# Patient Record
Sex: Male | Born: 1940
Health system: Southern US, Community
[De-identification: ages and names within clinical notes are randomized; demographics above are authoritative.]

## PROBLEM LIST (undated history)

## (undated) DIAGNOSIS — I779 Disorder of arteries and arterioles, unspecified: Secondary | ICD-10-CM

## (undated) DIAGNOSIS — J189 Pneumonia, unspecified organism: Secondary | ICD-10-CM

## (undated) DIAGNOSIS — R011 Cardiac murmur, unspecified: Secondary | ICD-10-CM

## (undated) DIAGNOSIS — I2510 Atherosclerotic heart disease of native coronary artery without angina pectoris: Secondary | ICD-10-CM

## (undated) DIAGNOSIS — I739 Peripheral vascular disease, unspecified: Secondary | ICD-10-CM

## (undated) DIAGNOSIS — J45909 Unspecified asthma, uncomplicated: Secondary | ICD-10-CM

## (undated) DIAGNOSIS — E785 Hyperlipidemia, unspecified: Secondary | ICD-10-CM

## (undated) DIAGNOSIS — I10 Essential (primary) hypertension: Secondary | ICD-10-CM

## (undated) DIAGNOSIS — I219 Acute myocardial infarction, unspecified: Secondary | ICD-10-CM

## (undated) DIAGNOSIS — K4090 Unilateral inguinal hernia, without obstruction or gangrene, not specified as recurrent: Secondary | ICD-10-CM

## (undated) HISTORY — DX: Hyperlipidemia, unspecified: E78.5

## (undated) HISTORY — DX: Unspecified asthma, uncomplicated: J45.909

## (undated) HISTORY — DX: Disorder of arteries and arterioles, unspecified: I77.9

## (undated) HISTORY — DX: Essential (primary) hypertension: I10

## (undated) HISTORY — PX: ORIF SHOULDER FRACTURE: SHX5035

## (undated) HISTORY — PX: HERNIA REPAIR: SHX51

## (undated) HISTORY — PX: COLONOSCOPY: SHX174

## (undated) HISTORY — PX: FRACTURE SURGERY: SHX138

## (undated) HISTORY — DX: Atherosclerotic heart disease of native coronary artery without angina pectoris: I25.10

## (undated) HISTORY — DX: Peripheral vascular disease, unspecified: I73.9

## (undated) HISTORY — DX: Unilateral inguinal hernia, without obstruction or gangrene, not specified as recurrent: K40.90

## (undated) HISTORY — PX: CARDIAC CATHETERIZATION: SHX172

## (undated) HISTORY — PX: OTHER SURGICAL HISTORY: SHX169

---

## 2000-10-28 DIAGNOSIS — I219 Acute myocardial infarction, unspecified: Secondary | ICD-10-CM

## 2000-10-28 HISTORY — DX: Acute myocardial infarction, unspecified: I21.9

## 2001-06-23 ENCOUNTER — Encounter: Payer: Self-pay | Admitting: General Surgery

## 2001-06-23 ENCOUNTER — Encounter: Admission: RE | Admit: 2001-06-23 | Discharge: 2001-06-23 | Payer: Self-pay | Admitting: General Surgery

## 2001-06-25 ENCOUNTER — Encounter (INDEPENDENT_AMBULATORY_CARE_PROVIDER_SITE_OTHER): Payer: Self-pay | Admitting: Specialist

## 2001-06-25 ENCOUNTER — Ambulatory Visit (HOSPITAL_BASED_OUTPATIENT_CLINIC_OR_DEPARTMENT_OTHER): Admission: RE | Admit: 2001-06-25 | Discharge: 2001-06-25 | Payer: Self-pay | Admitting: General Surgery

## 2001-10-09 ENCOUNTER — Inpatient Hospital Stay (HOSPITAL_COMMUNITY): Admission: EM | Admit: 2001-10-09 | Discharge: 2001-10-12 | Payer: Self-pay | Admitting: Emergency Medicine

## 2002-02-11 ENCOUNTER — Encounter (INDEPENDENT_AMBULATORY_CARE_PROVIDER_SITE_OTHER): Payer: Self-pay | Admitting: Specialist

## 2002-02-11 ENCOUNTER — Inpatient Hospital Stay (HOSPITAL_COMMUNITY): Admission: EM | Admit: 2002-02-11 | Discharge: 2002-02-13 | Payer: Self-pay

## 2002-02-11 ENCOUNTER — Encounter: Payer: Self-pay | Admitting: Emergency Medicine

## 2004-10-10 ENCOUNTER — Ambulatory Visit: Payer: Self-pay | Admitting: Cardiology

## 2004-10-15 ENCOUNTER — Ambulatory Visit: Payer: Self-pay | Admitting: Cardiovascular Disease

## 2005-04-02 ENCOUNTER — Ambulatory Visit: Payer: Self-pay | Admitting: Cardiology

## 2005-04-11 ENCOUNTER — Ambulatory Visit: Payer: Self-pay

## 2005-04-12 ENCOUNTER — Ambulatory Visit: Payer: Self-pay | Admitting: Cardiology

## 2005-04-18 ENCOUNTER — Ambulatory Visit: Payer: Self-pay | Admitting: Cardiology

## 2005-10-11 ENCOUNTER — Ambulatory Visit: Payer: Self-pay | Admitting: Cardiology

## 2005-10-17 ENCOUNTER — Ambulatory Visit: Payer: Self-pay | Admitting: Cardiology

## 2006-04-03 ENCOUNTER — Ambulatory Visit: Payer: Self-pay | Admitting: Cardiology

## 2006-04-04 ENCOUNTER — Ambulatory Visit: Payer: Self-pay

## 2006-04-07 ENCOUNTER — Ambulatory Visit: Payer: Self-pay | Admitting: Cardiology

## 2006-04-10 ENCOUNTER — Ambulatory Visit: Payer: Self-pay | Admitting: Cardiology

## 2006-08-02 ENCOUNTER — Inpatient Hospital Stay (HOSPITAL_COMMUNITY): Admission: EM | Admit: 2006-08-02 | Discharge: 2006-08-04 | Payer: Self-pay | Admitting: Emergency Medicine

## 2006-08-02 ENCOUNTER — Ambulatory Visit: Payer: Self-pay | Admitting: Cardiovascular Disease

## 2006-08-19 ENCOUNTER — Ambulatory Visit: Payer: Self-pay | Admitting: Internal Medicine

## 2006-10-02 ENCOUNTER — Ambulatory Visit: Payer: Self-pay | Admitting: Cardiology

## 2006-10-02 LAB — CONVERTED CEMR LAB
ALT: 19 units/L (ref 0–40)
AST: 23 units/L (ref 0–37)
Albumin: 4.2 g/dL (ref 3.5–5.2)
Alkaline Phosphatase: 67 units/L (ref 39–117)
Bilirubin, Direct: 0.2 mg/dL (ref 0.0–0.3)
Chol/HDL Ratio, serum: 3.9
Cholesterol: 149 mg/dL (ref 0–200)
HDL: 37.8 mg/dL — ABNORMAL LOW (ref >39.0)
LDL Cholesterol: 83 mg/dL (ref 0–99)
Total Bilirubin: 1.1 mg/dL (ref 0.3–1.2)
Total Protein: 7.2 g/dL (ref 6.0–8.3)
Triglyceride fasting, serum: 139 mg/dL (ref 0–149)
VLDL: 28 mg/dL (ref 0–40)

## 2006-10-09 ENCOUNTER — Ambulatory Visit: Payer: Self-pay | Admitting: Cardiology

## 2007-03-26 ENCOUNTER — Ambulatory Visit: Payer: Self-pay | Admitting: Cardiology

## 2007-03-26 LAB — CONVERTED CEMR LAB
ALT: 25 units/L (ref 0–40)
AST: 23 units/L (ref 0–37)
Albumin: 4.2 g/dL (ref 3.5–5.2)
Alkaline Phosphatase: 64 units/L (ref 39–117)
Bilirubin, Direct: 0.2 mg/dL (ref 0.0–0.3)
Cholesterol: 184 mg/dL (ref 0–200)
Direct LDL: 99.1 mg/dL
HDL: 31.7 mg/dL — ABNORMAL LOW (ref >39.0)
Total Bilirubin: 0.9 mg/dL (ref 0.3–1.2)
Total CHOL/HDL Ratio: 5.8
Total Protein: 7.3 g/dL (ref 6.0–8.3)
Triglycerides: 299 mg/dL (ref 0–149)
VLDL: 60 mg/dL — ABNORMAL HIGH (ref 0–40)

## 2007-04-02 ENCOUNTER — Ambulatory Visit: Payer: Self-pay | Admitting: Cardiology

## 2007-04-09 ENCOUNTER — Ambulatory Visit: Payer: Self-pay | Admitting: Cardiology

## 2007-04-09 ENCOUNTER — Ambulatory Visit: Payer: Self-pay

## 2007-04-09 LAB — CONVERTED CEMR LAB
BUN: 11 mg/dL (ref 6–23)
CO2: 29 meq/L (ref 19–32)
Calcium: 9.7 mg/dL (ref 8.4–10.5)
Chloride: 102 meq/L (ref 96–112)
Creatinine, Ser: 1 mg/dL (ref 0.4–1.5)
GFR calc Af Amer: 96 mL/min
GFR calc non Af Amer: 79 mL/min
Glucose, Bld: 93 mg/dL (ref 70–99)
Potassium: 4 meq/L (ref 3.5–5.1)
Sodium: 139 meq/L (ref 135–145)

## 2007-08-28 ENCOUNTER — Ambulatory Visit: Payer: Self-pay | Admitting: Cardiology

## 2007-08-28 LAB — CONVERTED CEMR LAB
ALT: 19 units/L (ref 0–53)
AST: 21 units/L (ref 0–37)
Albumin: 4.2 g/dL (ref 3.5–5.2)
Alkaline Phosphatase: 58 units/L (ref 39–117)
Bilirubin, Direct: 0.1 mg/dL (ref 0.0–0.3)
Cholesterol: 169 mg/dL (ref 0–200)
Direct LDL: 100.9 mg/dL
HDL: 34.9 mg/dL — ABNORMAL LOW (ref >39.0)
Total Bilirubin: 0.9 mg/dL (ref 0.3–1.2)
Total CHOL/HDL Ratio: 4.8
Total Protein: 7.3 g/dL (ref 6.0–8.3)
Triglycerides: 231 mg/dL (ref 0–149)
VLDL: 46 mg/dL — ABNORMAL HIGH (ref 0–40)

## 2007-09-03 ENCOUNTER — Ambulatory Visit: Payer: Self-pay | Admitting: Cardiology

## 2007-09-17 ENCOUNTER — Ambulatory Visit: Payer: Self-pay | Admitting: Cardiology

## 2007-09-17 LAB — CONVERTED CEMR LAB
CO2: 28 meq/L (ref 19–32)
Creatinine, Ser: 1 mg/dL (ref 0.4–1.5)
GFR calc Af Amer: 96 mL/min
Potassium: 4.3 meq/L (ref 3.5–5.1)
Sodium: 135 meq/L (ref 135–145)

## 2007-10-08 ENCOUNTER — Ambulatory Visit: Payer: Self-pay | Admitting: Cardiology

## 2007-10-08 LAB — CONVERTED CEMR LAB
ALT: 30 units/L (ref 0–53)
AST: 28 units/L (ref 0–37)
Alkaline Phosphatase: 64 units/L (ref 39–117)
Cholesterol: 152 mg/dL (ref 0–200)
LDL Cholesterol: 82 mg/dL (ref 0–99)
VLDL: 37 mg/dL (ref 0–40)

## 2007-10-15 ENCOUNTER — Ambulatory Visit: Payer: Self-pay | Admitting: Cardiology

## 2008-04-06 ENCOUNTER — Ambulatory Visit: Payer: Self-pay | Admitting: Cardiology

## 2008-04-06 ENCOUNTER — Ambulatory Visit: Payer: Self-pay

## 2008-04-12 ENCOUNTER — Ambulatory Visit: Payer: Self-pay | Admitting: Cardiology

## 2008-04-12 LAB — CONVERTED CEMR LAB
Alkaline Phosphatase: 55 units/L (ref 39–117)
Bilirubin, Direct: 0.1 mg/dL (ref 0.0–0.3)
GFR calc Af Amer: 96 mL/min
GFR calc non Af Amer: 79 mL/min
HDL: 32.4 mg/dL — ABNORMAL LOW (ref >39.0)
Potassium: 4.4 meq/L (ref 3.5–5.1)
Sodium: 140 meq/L (ref 135–145)
Total Bilirubin: 0.7 mg/dL (ref 0.3–1.2)
VLDL: 40 mg/dL (ref 0–40)

## 2008-04-14 ENCOUNTER — Ambulatory Visit: Payer: Self-pay | Admitting: Cardiology

## 2008-09-28 ENCOUNTER — Ambulatory Visit: Payer: Self-pay | Admitting: Cardiology

## 2008-10-07 ENCOUNTER — Ambulatory Visit: Payer: Self-pay | Admitting: Cardiology

## 2008-10-07 DIAGNOSIS — I2510 Atherosclerotic heart disease of native coronary artery without angina pectoris: Secondary | ICD-10-CM | POA: Insufficient documentation

## 2008-10-07 DIAGNOSIS — I2119 ST elevation (STEMI) myocardial infarction involving other coronary artery of inferior wall: Secondary | ICD-10-CM

## 2008-10-07 DIAGNOSIS — I679 Cerebrovascular disease, unspecified: Secondary | ICD-10-CM

## 2008-10-07 DIAGNOSIS — E785 Hyperlipidemia, unspecified: Secondary | ICD-10-CM

## 2008-10-07 LAB — CONVERTED CEMR LAB
ALT: 23 units/L (ref 0–53)
AST: 26 units/L (ref 0–37)
Albumin: 4.1 g/dL (ref 3.5–5.2)
Alkaline Phosphatase: 52 units/L (ref 39–117)
BUN: 22 mg/dL (ref 6–23)
Bilirubin, Direct: 0.1 mg/dL (ref 0.0–0.3)
CO2: 31 meq/L (ref 19–32)
Calcium: 9.9 mg/dL (ref 8.4–10.5)
Chloride: 100 meq/L (ref 96–112)
Cholesterol: 159 mg/dL (ref 0–200)
Creatinine, Ser: 1 mg/dL (ref 0.4–1.5)
GFR calc Af Amer: 95 mL/min
GFR calc non Af Amer: 79 mL/min
Glucose, Bld: 112 mg/dL — ABNORMAL HIGH (ref 70–99)
HDL: 41.1 mg/dL (ref >39.0)
LDL Cholesterol: 80 mg/dL (ref 0–99)
Potassium: 4.5 meq/L (ref 3.5–5.1)
Sodium: 138 meq/L (ref 135–145)
Total Bilirubin: 0.8 mg/dL (ref 0.3–1.2)
Total CHOL/HDL Ratio: 3.9
Total Protein: 7.2 g/dL (ref 6.0–8.3)
Triglycerides: 192 mg/dL — ABNORMAL HIGH (ref 0–149)
VLDL: 38 mg/dL (ref 0–40)

## 2008-10-13 ENCOUNTER — Ambulatory Visit: Payer: Self-pay | Admitting: Cardiology

## 2009-04-06 ENCOUNTER — Ambulatory Visit: Payer: Self-pay | Admitting: Cardiology

## 2009-04-13 ENCOUNTER — Ambulatory Visit: Payer: Self-pay

## 2009-04-13 ENCOUNTER — Ambulatory Visit: Payer: Self-pay | Admitting: Internal Medicine

## 2009-04-13 LAB — CONVERTED CEMR LAB
Cholesterol, target level: 200 mg/dL
HDL goal, serum: 40 mg/dL
LDL Goal: 100 mg/dL

## 2009-07-25 ENCOUNTER — Encounter (INDEPENDENT_AMBULATORY_CARE_PROVIDER_SITE_OTHER): Payer: Self-pay | Admitting: *Deleted

## 2009-10-06 ENCOUNTER — Ambulatory Visit: Payer: Self-pay | Admitting: Cardiology

## 2009-10-06 LAB — CONVERTED CEMR LAB
Bilirubin, Direct: 0.1 mg/dL (ref 0.0–0.3)
LDL Cholesterol: 77 mg/dL (ref 0–99)
Total Bilirubin: 0.7 mg/dL (ref 0.3–1.2)
Total CHOL/HDL Ratio: 4
VLDL: 35.6 mg/dL (ref 0.0–40.0)

## 2009-10-10 ENCOUNTER — Telehealth: Payer: Self-pay | Admitting: Cardiology

## 2009-10-10 ENCOUNTER — Ambulatory Visit: Payer: Self-pay | Admitting: Cardiology

## 2009-10-10 DIAGNOSIS — I10 Essential (primary) hypertension: Secondary | ICD-10-CM

## 2009-10-16 ENCOUNTER — Ambulatory Visit: Payer: Self-pay | Admitting: Cardiology

## 2009-10-31 ENCOUNTER — Ambulatory Visit: Payer: Self-pay | Admitting: Internal Medicine

## 2009-11-05 ENCOUNTER — Encounter: Payer: Self-pay | Admitting: Internal Medicine

## 2009-11-10 ENCOUNTER — Ambulatory Visit: Payer: Self-pay | Admitting: Internal Medicine

## 2009-11-15 ENCOUNTER — Ambulatory Visit: Payer: Self-pay | Admitting: Internal Medicine

## 2009-11-24 ENCOUNTER — Telehealth: Payer: Self-pay | Admitting: Internal Medicine

## 2009-11-24 ENCOUNTER — Ambulatory Visit: Payer: Self-pay | Admitting: Internal Medicine

## 2009-11-29 ENCOUNTER — Telehealth (INDEPENDENT_AMBULATORY_CARE_PROVIDER_SITE_OTHER): Payer: Self-pay

## 2009-11-30 ENCOUNTER — Ambulatory Visit: Payer: Self-pay | Admitting: Internal Medicine

## 2009-11-30 ENCOUNTER — Encounter (HOSPITAL_COMMUNITY): Admission: RE | Admit: 2009-11-30 | Discharge: 2010-02-01 | Payer: Self-pay | Admitting: Cardiology

## 2009-11-30 ENCOUNTER — Ambulatory Visit: Payer: Self-pay

## 2010-03-16 ENCOUNTER — Ambulatory Visit: Payer: Self-pay | Admitting: Cardiology

## 2010-03-20 ENCOUNTER — Encounter (INDEPENDENT_AMBULATORY_CARE_PROVIDER_SITE_OTHER): Payer: Self-pay | Admitting: *Deleted

## 2010-03-20 LAB — CONVERTED CEMR LAB
ALT: 20 U/L
AST: 18 U/L
Albumin: 4.1 g/dL
Alkaline Phosphatase: 59 U/L
Bilirubin, Direct: 0.1 mg/dL
Cholesterol: 149 mg/dL
HDL: 39.1 mg/dL
LDL Cholesterol: 74 mg/dL
Total Bilirubin: 0.8 mg/dL
Total CHOL/HDL Ratio: 4
Total Protein: 6.8 g/dL
Triglycerides: 181 mg/dL — ABNORMAL HIGH
VLDL: 36.2 mg/dL

## 2010-03-29 ENCOUNTER — Ambulatory Visit: Payer: Self-pay | Admitting: Internal Medicine

## 2010-04-17 ENCOUNTER — Encounter: Payer: Self-pay | Admitting: Cardiology

## 2010-04-18 ENCOUNTER — Ambulatory Visit: Payer: Self-pay

## 2010-04-18 ENCOUNTER — Encounter: Payer: Self-pay | Admitting: Cardiology

## 2010-10-01 ENCOUNTER — Ambulatory Visit: Payer: Self-pay | Admitting: Cardiology

## 2010-10-02 LAB — CONVERTED CEMR LAB
AST: 21 units/L (ref 0–37)
Albumin: 4 g/dL (ref 3.5–5.2)
Cholesterol: 139 mg/dL (ref 0–200)
HDL: 35.4 mg/dL — ABNORMAL LOW (ref >39.00)
LDL Cholesterol: 84 mg/dL (ref 0–99)
Total CHOL/HDL Ratio: 4
Total Protein: 6.7 g/dL (ref 6.0–8.3)
Triglycerides: 96 mg/dL (ref 0.0–149.0)

## 2010-10-11 ENCOUNTER — Ambulatory Visit: Payer: Self-pay | Admitting: Internal Medicine

## 2010-10-11 ENCOUNTER — Ambulatory Visit: Payer: Self-pay | Admitting: Cardiology

## 2010-10-11 ENCOUNTER — Encounter: Payer: Self-pay | Admitting: Cardiology

## 2010-10-11 LAB — CONVERTED CEMR LAB
BUN: 15 mg/dL (ref 6–23)
CO2: 28 meq/L (ref 19–32)
Calcium: 9.5 mg/dL (ref 8.4–10.5)
Creatinine, Ser: 1 mg/dL (ref 0.4–1.5)
Glucose, Bld: 88 mg/dL (ref 70–99)

## 2010-11-25 LAB — CONVERTED CEMR LAB
AST: 19 units/L (ref 0–37)
Albumin: 4 g/dL (ref 3.5–5.2)
Alkaline Phosphatase: 58 units/L (ref 39–117)
BUN: 10 mg/dL (ref 6–23)
CO2: 29 meq/L (ref 19–32)
Chloride: 103 meq/L (ref 96–112)
Cholesterol: 118 mg/dL (ref 0–200)
Glucose, Bld: 98 mg/dL (ref 70–99)
LDL Cholesterol: 60 mg/dL (ref 0–99)
Potassium: 4 meq/L (ref 3.5–5.1)
Total Protein: 7 g/dL (ref 6.0–8.3)
Triglycerides: 110 mg/dL (ref 0.0–149.0)

## 2010-11-27 NOTE — Assessment & Plan Note (Signed)
Summary: lipid/saf   Visit Type:  Follow-up  CC:  dyslipidemia follow-up.   Lipid Clinic Visit      The patient comes in today for dyslipidemia follow-up.  The patient has no complaints of medication problems, chest pain, muscle aches, or  muscle cramps.  He is compliant with his Crestor 40mg  and fish oil 2 gm daily.   Dietary compliance review reveals pt is trying to eat a low-fat, low-cholesterol diet.  His wife does cook most of their meals.  He eats lots of chicken and no red meat of fried foods.  He does report drinking at least 1 Pepsi a day.  Review of exercise habits reveals that the patient does not have a formal exercise routine but is staying active.  He fishes a few times a week, runs 2 car washes and works in his yard.    Lipid Management Provider  Weston Brass, PharmD  Current Medications (verified): 1)  Lisinopril 40 Mg Tabs (Lisinopril) .... Take One Tablet By Mouth Daily 2)  Toprol Xl 100 Mg Xr24h-Tab (Metoprolol Succinate) .... Take One Daily 3)  Aspirin 81 Mg Chew (Aspirin) .... Take One Daily 4)  Crestor 40 Mg Tabs (Rosuvastatin Calcium) .... Take One Daily 5)  Hydrochlorothiazide 12.5 Mg Caps (Hydrochlorothiazide) .... Take One Daily 6)  Fish Oil 1000 Mg Caps (Omega-3 Fatty Acids) .... Take Two Daily  Allergies: 1)  ! Penicillin   Vital Signs:  Patient profile:   70 year old male Weight:      169 pounds Pulse rate:   70 / minute BP sitting:   102 / 64  (right arm) Cuff size:   regular  Impression & Recommendations:  Problem # 1:  HYPERLIPIDEMIA-MIXED (ICD-272.4) Assessment Unchanged Since December, pt's cholesterol remains relatively unchanged.  TC- 149 (goal <200), TG- 181 (goal <150), HDL 39.1 (goal >40), LDL 74 (goal <70).  LFT are WNL.  He has no issues tolerating his current medication regimen.  Will try to increase his fish oil to 3 gm daily to decrease TG and maybe get some reduction in LDL.  Pt is reluctant to increase pill burden so will not add  additional LDL lowering agent at this time.  Will ask him to continue his diet but try to decrease the amount of soft drinks he consumes.  Will recheck his cholesterol in 6 months.   His updated medication list for this problem includes:    Crestor 40 Mg Tabs (Rosuvastatin calcium) .Marland Kitchen... Take one daily  Patient Instructions: 1)  Continue Crestor 40mg  daily 2)  Increase Fish Oil to 3 capsules a day 3)  Decrease amount of Pepsi you drink 4)  Continue working in yard and exercising 5)  Recheck lipid panel in 6 months: 10/01/10 at 7:30 am at [facility] office 6)  Next Lipid Clinic Appt: 10/11/10 at 9:00

## 2010-11-27 NOTE — Letter (Signed)
Summary: Custom - Lipid  [facility], Main Office  [address]   [place], [place] [address]   Phone: [phone]  Fax: [phone]     Mar 20, 2010 MRN: [id number]   [facility] [address] [place], [place]  [address]   Dear [name],  We have reviewed your cholesterol results.  They are as follows:     Total Cholesterol:    149 (Desirable: less than 200)       HDL  Cholesterol:     39.10  (Desirable: greater than 40 for men and 50 for women)       LDL Cholesterol:       74  (Desirable: less than 100 for low risk and less than 70 for moderate to high risk)       Triglycerides:       181.0  (Desirable: less than 150)  Our recommendations include:These numbers look good. Continue on the same medicine. Liver function is normal. Take care, Dr. Darel Hong.    Call our office at the number listed above if you have any questions.  Lowering your LDL cholesterol is important, but it is only one of a large number of "risk factors" that may indicate that you are at risk for heart disease, stroke or other complications of hardening of the arteries.  Other risk factors include:   A.  Cigarette Smoking* B.  High Blood Pressure* C.  Obesity* D.   Low HDL Cholesterol (see yours above)* E.   Diabetes Mellitus (higher risk if your is uncontrolled) F.  Family history of premature heart disease G.  Previous history of stroke or cardiovascular disease    *These are risk factors YOU HAVE CONTROL OVER.  For more information, visit .  There is now evidence that lowering the TOTAL CHOLESTEROL AND LDL CHOLESTEROL can reduce the risk of heart disease.  The American Heart Association recommends the following guidelines for the treatment of elevated cholesterol:  1.  If there is now current heart disease and less than two risk factors, TOTAL CHOLESTEROL should be less than 200 and LDL CHOLESTEROL should be less than 100. 2.  If there is current heart disease or two or  more risk factors, TOTAL CHOLESTEROL should be less than 200 and LDL CHOLESTEROL should be less than 70.  A diet low in cholesterol, saturated fat, and calories is the cornerstone of treatment for elevated cholesterol.  Cessation of smoking and exercise are also important in the management of elevated cholesterol and preventing vascular disease.  Studies have shown that 30 to 60 minutes of physical activity most days can help lower blood pressure, lower cholesterol, and keep your weight at a healthy level.  Drug therapy is used when cholesterol levels do not respond to therapeutic lifestyle changes (smoking cessation, diet, and exercise) and remains unacceptably high.  If medication is started, it is important to have you levels checked periodically to evaluate the need for further treatment options.  Thank you,    [employer] Team

## 2010-11-27 NOTE — Assessment & Plan Note (Signed)
Summary: Cardiology Nuclear Study  Nuclear Med Background Indications for Stress Test: Evaluation for Ischemia, Stent Patency   History: Abnormal EKG, Heart Catheterization, Myocardial Infarction, Stents  History Comments: '02 ILWMI>Stent RCA 10/07 Cath: 20% in-stent, 50% LAD, EF=65%.  Symptoms: DOE    Nuclear Pre-Procedure Cardiac Risk Factors: Carotid Disease, Hypertension, Lipids Caffeine/Decaff Intake: None NPO After: 7:00 PM Lungs: clear IV 0.9% NS with Angio Cath: 22g     IV Site: (R) AC IV Started by: Irean Hong RN Chest Size (in) 40     Height (in): 69 Weight (lb): 167 BMI: 24.75  Nuclear Med Study 1 or 2 day study:  1 day     Stress Test Type:  Eugenie Birks Reading MD:  Arvilla Meres, MD     Referring MD:  B.Crenshaw Resting Radionuclide:  Technetium 33m Tetrofosmin     Resting Radionuclide Dose:  11.0 mCi  Stress Radionuclide:  Technetium 22m Tetrofosmin     Stress Radionuclide Dose:  33.0 mCi   Stress Protocol   Lexiscan: 0.4 mg   Stress Test Technologist:  Milana Na EMT-P     Nuclear Technologist:  Burna Mortimer Deal RT-N  Rest Procedure  Myocardial perfusion imaging was performed at rest 45 minutes following the intravenous administration of Myoview Technetium 56m Tetrofosmin.  Stress Procedure  The patient received IV Lexiscan 0.4 mg over 15-seconds.  Myoview injected at 30-seconds.  There were no significant changes with infusion.  Quantitative spect images were obtained after a 45 minute delay.  QPS Raw Data Images:  Normal; no motion artifact; normal heart/lung ratio. Stress Images:  Mildly decreased uptake in inferolateral wall  Rest Images:  Mildly decreased uptake in inferolateral wall  Subtraction (SDS):  There is a fixed defect that is most consistent with a previous infarction versus diaprhagmatic attenuation. No ischemia. Transient Ischemic Dilatation:  .99  (Normal <1.22)  Lung/Heart Ratio:  .29  (Normal <0.45)  Quantitative Gated Spect  Images QGS EDV:  70 ml QGS ESV:  21 ml QGS EF:  70 % QGS cine images:  Normal  Findings Low risk nuclear study      Overall Impression  Exercise Capacity: Lexiscan with low level exercise. ECG Impression: Baseline: NSR; No significant ST segment change with Lexiscan. Overall Impression: Low risk stress nuclear study. Overall Impression Comments: There is a fixed defect that is most consistent with a previous infarction versus diaprhagmatic attenuation. No ischemia.  Appended Document: Cardiology Nuclear Study ok  Appended Document: Cardiology Nuclear Study pt aware of results

## 2010-11-27 NOTE — Miscellaneous (Signed)
Summary: Orders Update  Clinical Lists Changes  Orders: Added new Test order of Carotid Duplex (Carotid Duplex) - Signed 

## 2010-11-27 NOTE — Progress Notes (Signed)
Summary: Nuc. Pre-Procedure  Phone Note Outgoing Call Call back at Home Phone [phone]   Call placed by: Irean Hong, RN,  November 29, 2009 1:48 PM Summary of Call: Left message with information on Myoview Information Sheet (see scanned document for details).      Nuclear Med Background Indications for Stress Test: Evaluation for Ischemia, Stent Patency   History: Abnormal EKG, Heart Catheterization, Myocardial Infarction, Stents  History Comments: '02 ILWMI>Stent RCA 10/07 Cath: 20% in-stent, 50% LAD, EF=65%.  Symptoms: DOE    Nuclear Pre-Procedure Cardiac Risk Factors: Carotid Disease, Hypertension, Lipids NPO After: 12:00 AM Height (in): 69

## 2010-11-27 NOTE — Assessment & Plan Note (Signed)
Summary: SUTURE REMOVAL/ MEN /NWS   Nurse Visit  CC: I removed #3 superfical sutures from pt's right shoulder/ pt tolerated well/ ab   Allergies: 1)  ! Penicillin  Orders Added: 1)  Est. Patient Level I [96295]

## 2010-11-27 NOTE — Assessment & Plan Note (Signed)
Summary: knot on back/over 5 yrs since he saw men/30 min slot/cd   Vital Signs:  Patient profile:   70 year old male Height:      69 inches Weight:      167 pounds BMI:     24.75 O2 Sat:      96 % on Room air Temp:     98.1 degrees F oral Pulse rate:   76 / minute BP sitting:   128 / 64  (left arm) Cuff size:   regular  Vitals Entered By: Bill Salinas CMA (October 31, 2009 2:40 PM)  O2 Flow:  Room air CC: pt here with complaint of knot that has been on his back for over 1 year/ ab   Primary Care Provider:  Jacques Navy MD  CC:  pt here with complaint of knot that has been on his back for over 1 year/ ab.  History of Present Illness: Patient presents for I&D sebaceous cyst on his back. He also has a large mole in the right axilla he would like removed.   Current Medications (verified): 1)  Lisinopril 40 Mg Tabs (Lisinopril) .... Take One Tablet By Mouth Daily 2)  Toprol Xl 100 Mg Xr24h-Tab (Metoprolol Succinate) .... Take One Daily 3)  Aspirin 81 Mg Chew (Aspirin) .... Take One Daily 4)  Crestor 40 Mg Tabs (Rosuvastatin Calcium) .... Take One Daily 5)  Hydrochlorothiazide 12.5 Mg Caps (Hydrochlorothiazide) .... Take One Daily 6)  Fish Oil 1000 Mg Caps (Omega-3 Fatty Acids) .... Take Two Daily  Allergies (verified): 1)  ! Penicillin   Impression & Recommendations:  Problem # 1:  SEBACEOUS CYST (ICD-706.2)  large sebaceous cyst causing discomfort. I&D see procedure note.  Orders: I&D Abscess, Simple / Single (10060)  Problem # 2:  NEOPLASM, SKIN, UNCERTAIN BEHAVIOR (ICD-238.2)  changing mole - shave biopsy see procedure note.   Orders: Shave Skin Lesion  >2.0 cm,/trunk,/arm/leg (16109)  Complete Medication List: 1)  Lisinopril 40 Mg Tabs (Lisinopril) .... Take one tablet by mouth daily 2)  Toprol Xl 100 Mg Xr24h-tab (Metoprolol succinate) .... Take one daily 3)  Aspirin 81 Mg Chew (Aspirin) .... Take one daily 4)  Crestor 40 Mg Tabs (Rosuvastatin calcium)  .... Take one daily 5)  Hydrochlorothiazide 12.5 Mg Caps (Hydrochlorothiazide) .... Take one daily 6)  Fish Oil 1000 Mg Caps (Omega-3 fatty acids) .... Take two daily   Preventive Care Screening  Last Tetanus Booster:    Date:  12/30/2000    Results:  Historical     Not Administered:    Influenza Vaccine not given due to: declined    Procedure Note  Incision & Drainage: The patient complains of irritation, inflammation, and tenderness. Indication: uncomfortable lesion   Procedure # 1: I & D    Size (in cm): 5.0 x 5.0    Location: mid-back T2 level    Comment: consent obtained. in a sterile manner cyst was incised with #10 blade. Expressed 20-30 cc sebum. With single tooth forceps and curve hemostat removed cyst capsule. Closed. Patient tolerated this well    Instrument used: #10 blade    Anesthesia: 2% xylocaine with epi    Closure: simple interrupted       # of superficial sutures: 5  Cleaned and prepped with: betadine Wound dressing: neosporin and bandaid Instructions: daily dressing changes and RTC in 7-10 days Additional Instructions: routine wound precautions provided  Mole Biopsy/Removal: The patient complains of changing mole. Indication: changing lesion  Procedure #  1: shave biopsy    Size (in cm): 2.0 x 2.0    Location: right axilla    Comment: informed consent. Dark broad pedicle mole.    Instrument used: dermablade    Anesthesia: 2% xylocain w/ epi    Closure: hyfrecator  Cleaned and prepped with: betadine Wound dressing: bandaid Additional Instructions: routine wound precautions.

## 2010-11-27 NOTE — Assessment & Plan Note (Signed)
Summary: SUTURE REMOVAL/NWS   #   Vital Signs:  Patient profile:   70 year old male Height:      69 inches Weight:      172 pounds BMI:     25.49 O2 Sat:      96 % on Room air Temp:     98.1 degrees F oral Pulse rate:   78 / minute BP sitting:   120 / 72  (left arm) Cuff size:   regular  Vitals Entered By: Bill Salinas CMA (November 10, 2009 11:33 AM)  O2 Flow:  Room air CC: pt here for suture removal. I removed 5 superficial sutures from pt's back, pt tolerated well/ ab   Primary Care Provider:  Jacques Navy MD  CC:  pt here for suture removal. I removed 5 superficial sutures from pt's back and pt tolerated well/ ab.  History of Present Illness: patient presents for suture removal after I&D sebaceous cyst upper back. No complaints - no drainage or fever.  Current Medications (verified): 1)  Lisinopril 40 Mg Tabs (Lisinopril) .... Take One Tablet By Mouth Daily 2)  Toprol Xl 100 Mg Xr24h-Tab (Metoprolol Succinate) .... Take One Daily 3)  Aspirin 81 Mg Chew (Aspirin) .... Take One Daily 4)  Crestor 40 Mg Tabs (Rosuvastatin Calcium) .... Take One Daily 5)  Hydrochlorothiazide 12.5 Mg Caps (Hydrochlorothiazide) .... Take One Daily 6)  Fish Oil 1000 Mg Caps (Omega-3 Fatty Acids) .... Take Two Daily  Allergies (verified): 1)  ! Penicillin PMH-FH-SH reviewed-no changes except otherwise noted  Review of Systems       The patient complains of suspicious skin lesions.  The patient denies fever.    Physical Exam  General:  Well-developed,well-nourished,in no acute distress; alert,appropriate and cooperative throughout examination Skin:  well healing incision site upper back without drainage, dehescience or inflammation.  Black, raised mole right upper scapula-laterally with irregular border.   Impression & Recommendations:  Problem # 1:  SEBACEOUS CYST (ICD-706.2) Resolved with good wound healing. Sutures removed by Ami Bullin CMA.  Problem # 2:  NEOPLASM, SKIN,  UNCERTAIN BEHAVIOR (ICD-238.2) suspicious black mole right shoulder.  Plan - patient to return for excisional biopsy.  Complete Medication List: 1)  Lisinopril 40 Mg Tabs (Lisinopril) .... Take one tablet by mouth daily 2)  Toprol Xl 100 Mg Xr24h-tab (Metoprolol succinate) .... Take one daily 3)  Aspirin 81 Mg Chew (Aspirin) .... Take one daily 4)  Crestor 40 Mg Tabs (Rosuvastatin calcium) .... Take one daily 5)  Hydrochlorothiazide 12.5 Mg Caps (Hydrochlorothiazide) .... Take one daily 6)  Fish Oil 1000 Mg Caps (Omega-3 fatty acids) .... Take two daily

## 2010-11-27 NOTE — Letter (Signed)
     November 06, 2009   [facility] [address] [place], [place] [address]  RE:  LAB RESULTS  Dear  Mr. Medero,  The following is an interpretation of your most recent lab tests.  Please take note of any instructions provided or changes to medications that have resulted from your lab work.   Lesion from the right axilla (armpit) was a benign seborrheic keratosis.   Sincerely Yours,    Jacques Navy MD

## 2010-11-27 NOTE — Progress Notes (Signed)
  Phone Note Other Incoming   Summary of Call: Pt came in today to have his sutures removed. He was wanting results from mole removal. Please Advise. Initial call taken by: Ami Bullins CMA,  November 24, 2009 10:12 AM  Follow-up for Phone Call        pigmented seborrheic keratosis = a benign lesion. Any trouble getting the sutres out.  Follow-up by: Jacques Navy MD,  November 24, 2009 2:11 PM  Additional Follow-up for Phone Call Additional follow up Details #1::        No everything came out well. Healed well and looks great. Additional Follow-up by: Ami Bullins CMA,  November 24, 2009 2:30 PM

## 2010-11-27 NOTE — Assessment & Plan Note (Signed)
Summary: mole removal/cd   Vital Signs:  Patient profile:   70 year old male Height:      69 inches Weight:      170 pounds BMI:     25.20 O2 Sat:      96 % on Room air Temp:     97.3 degrees F oral Pulse rate:   72 / minute BP sitting:   128 / 70  (left arm) Cuff size:   regular  Vitals Entered By: Bill Salinas CMA (November 15, 2009 11:28 AM)  O2 Flow:  Room air CC: office visit for mole removal/ ab   Primary Care Provider:  Jacques Navy MD  CC:  office visit for mole removal/ ab.  History of Present Illness: Presents for excisional biopsy of a very suspicious mole on the right shoulder.  Current Medications (verified): 1)  Lisinopril 40 Mg Tabs (Lisinopril) .... Take One Tablet By Mouth Daily 2)  Toprol Xl 100 Mg Xr24h-Tab (Metoprolol Succinate) .... Take One Daily 3)  Aspirin 81 Mg Chew (Aspirin) .... Take One Daily 4)  Crestor 40 Mg Tabs (Rosuvastatin Calcium) .... Take One Daily 5)  Hydrochlorothiazide 12.5 Mg Caps (Hydrochlorothiazide) .... Take One Daily 6)  Fish Oil 1000 Mg Caps (Omega-3 Fatty Acids) .... Take Two Daily  Allergies (verified): 1)  ! Penicillin   Complete Medication List: 1)  Lisinopril 40 Mg Tabs (Lisinopril) .... Take one tablet by mouth daily 2)  Toprol Xl 100 Mg Xr24h-tab (Metoprolol succinate) .... Take one daily 3)  Aspirin 81 Mg Chew (Aspirin) .... Take one daily 4)  Crestor 40 Mg Tabs (Rosuvastatin calcium) .... Take one daily 5)  Hydrochlorothiazide 12.5 Mg Caps (Hydrochlorothiazide) .... Take one daily 6)  Fish Oil 1000 Mg Caps (Omega-3 fatty acids) .... Take two daily  Other Orders: Excise lesion (TAL) 0 - 0.5 cm (11400)   Procedure Note Last Tetanus: Historical (12/30/2000)  Biopsy: Biopsy Type: Skin The patient complains of suspicious lesion. Indication: suspicious lesion Consent signed: yes  Procedure # 1: elliptical incision with 2 mm margin    Size (in cm): .5 x .5    Location: right shoulder/scapula    Comment:  informed consent obtained    Instrument used: #15 blade    Anesthesia: 2% lidocaine w/epinephrine    Closure: simple interrupted       # of superficial sutures: 3  Cleaned and prepped with: betadine Wound dressing: bandaid Instructions: RTC in 7-10 days Additional Instructions: routine wound precautions.

## 2010-11-27 NOTE — Miscellaneous (Signed)
Summary: Special Procedure/[facility]  Special Procedure/[facility]   Imported By: Lester Glen Ullin 11/16/2009 10:06:02  _____________________________________________________________________  External Attachment:    Type:   Image     Comment:   External Document

## 2010-11-29 NOTE — Assessment & Plan Note (Signed)
Summary: YEARLY/SL  Medications Added FISH OIL 1000 MG CAPS (OMEGA-3 FATTY ACIDS) 3 tabs by mouth once daily        Primary Provider:  Jacques Navy MD   History of Present Illness: [name] is a pleasant gentleman who has a history of coronary artery disease.  His last cardiac catheterization on August 04, 2006, showed an ejection fraction of 65%.  The distal LAD had a 50% stenosis at the origin of the diagonal.  The right coronary artery stent was patent. There is a 20% in-stent restenosis. Last carotid Dopplers in June of 2011 and showed a 40-59% left stenosis and a chronically occluded right internal carotid artery. Followup was recommended in one year. Note an abdominal ultrasound in May 2005 showed no aneurysm. Myoview in February of 2011 showed an ejection fraction of 70%, fixed inferior defect and no ischemia. I last saw him in December of 2010. Since then the patient denies any dyspnea on exertion, orthopnea, PND, pedal edema, palpitations, syncope or chest pain.   Current Medications (verified): 1)  Lisinopril 40 Mg Tabs (Lisinopril) .... Take One Tablet By Mouth Daily 2)  Toprol Xl 100 Mg Xr24h-Tab (Metoprolol Succinate) .... Take One Daily 3)  Aspirin 81 Mg Chew (Aspirin) .... Take One Daily 4)  Crestor 40 Mg Tabs (Rosuvastatin Calcium) .... Take One Daily 5)  Hydrochlorothiazide 12.5 Mg Caps (Hydrochlorothiazide) .... Take One Daily 6)  Fish Oil 1000 Mg Caps (Omega-3 Fatty Acids) .... 3 Tabs By Mouth Once Daily  Allergies: 1)  ! Penicillin  Past History:  Past Medical History: Reviewed history from 10/10/2009 and no changes required. Current Problems:  UNSPECIFIED CEREBROVASCULAR DISEASE (ICD-437.9) HYPERLIPIDEMIA-MIXED (ICD-272.4) CORONARY ATHEROSCLEROSIS NATIVE CORONARY ARTERY (ICD-414.01) MYOCARDIAL INFARCTION, INFEROLATERAL WALL, INITIAL EPISODE (ICD-410.21) Hypertension  Past Surgical History: Reviewed history from 10/07/2008 and no changes  required. Ventral Hernia Repair  Social History: Reviewed history from 10/10/2009 and no changes required. Tobacco Use - No.  Alcohol Use - no  Review of Systems       no fevers or chills, productive cough, hemoptysis, dysphasia, odynophagia, melena, hematochezia, dysuria, hematuria, rash, seizure activity, orthopnea, PND, pedal edema, claudication. Remaining systems are negative.   Vital Signs:  Patient profile:   70 year old male Height:      69 inches Weight:      172 pounds BMI:     25.49 Pulse rate:   84 / minute Resp:     14 per minute BP sitting:   138 / 79  (left arm)  Vitals Entered By: Kem Parkinson (October 11, 2010 10:16 AM)  Physical Exam  General:  Well-developed well-nourished in no acute distress.  Skin is warm and dry.  HEENT is normal.  Neck is supple. No thyromegaly.  Chest is clear to auscultation with normal expansion.  Cardiovascular exam is regular rate and rhythm.  Abdominal exam nontender or distended. No masses palpated. Extremities show no edema. neuro grossly intact    EKG  Procedure date:  10/11/2010  Findings:      Sinus rhythm with fusion complexes, left anterior vesicular block.  Impression & Recommendations:  Problem # 1:  ESSENTIAL HYPERTENSION, BENIGN (ICD-401.1)  Blood pressure controlled on present medications. Will continue. Check potassium and renal function. His updated medication list for this problem includes:    Lisinopril 40 Mg Tabs (Lisinopril) .Marland Kitchen... Take one tablet by mouth daily    Toprol Xl 100 Mg Xr24h-tab (Metoprolol succinate) .Marland Kitchen... Take one daily    Aspirin 81  Mg Chew (Aspirin) .Marland Kitchen... Take one daily    Hydrochlorothiazide 12.5 Mg Caps (Hydrochlorothiazide) .Marland Kitchen... Take one daily  Orders: TLB-BMP (Basic Metabolic Panel-BMET) (80048-METABOL)  Problem # 2:  UNSPECIFIED CEREBROVASCULAR DISEASE (ICD-437.9) Continue aspirin and statin. Followup carotid Dopplers June 2012.  Problem # 3:  HYPERLIPIDEMIA-MIXED  (ICD-272.4)  Continue statin. Followed in the lipid clinic. His updated medication list for this problem includes:    Crestor 40 Mg Tabs (Rosuvastatin calcium) .Marland Kitchen... Take one daily  His updated medication list for this problem includes:    Crestor 40 Mg Tabs (Rosuvastatin calcium) .Marland Kitchen... Take one daily  Problem # 4:  CORONARY ATHEROSCLEROSIS NATIVE CORONARY ARTERY (ICD-414.01) Continue aspirin, beta blocker, ACE inhibitor and statin. His updated medication list for this problem includes:    Lisinopril 40 Mg Tabs (Lisinopril) .Marland Kitchen... Take one tablet by mouth daily    Toprol Xl 100 Mg Xr24h-tab (Metoprolol succinate) .Marland Kitchen... Take one daily    Aspirin 81 Mg Chew (Aspirin) .Marland Kitchen... Take one daily  Orders: EKG w/ Interpretation (93000) TLB-BMP (Basic Metabolic Panel-BMET) (80048-METABOL)  His updated medication list for this problem includes:    Lisinopril 40 Mg Tabs (Lisinopril) .Marland Kitchen... Take one tablet by mouth daily    Toprol Xl 100 Mg Xr24h-tab (Metoprolol succinate) .Marland Kitchen... Take one daily    Aspirin 81 Mg Chew (Aspirin) .Marland Kitchen... Take one daily  Patient Instructions: 1)  Your physician recommends that you schedule a follow-up appointment in: 1 year with Dr. Jens Som 2)  Your physician recommends that you continue on your current medications as directed. Please refer to the Current Medication list given to you today. 3)  Your physician recommends that you  have lab work today: bmet

## 2010-11-29 NOTE — Assessment & Plan Note (Signed)
Summary: rov/sp     Lipid Clinic Visit      The patient comes in today for dyslipidemia follow-up.  The patient has no complaints of medication problems, chest pain, muscle aches, or  muscle cramps.  He is compliant with his Crestor 40mg  and fish oil 2 gm daily.   Dietary compliance review reveals pt is trying to eat a low-fat, low-cholesterol diet.  For breakfast he will often have 3-4 cups of decaf coffee and a sausage burritto.  Lunch is his biggest meal of the day. He and his wife will go to the cafeteria and get vegetables.  If he does eat dinner, his wife does most of the cooking.   It is often something like chicken and rice.  He does report drinking at least 1 Pepsi a day.  Review of exercise habits reveals that the patient does not have a formal exercise routine but is staying active.  He fishes a few times a week, runs 2 car washes and works in his yard.   He saw Dr. Jens Som today and no changes were made.   Lipid Management Provider  Weston Brass, PharmD  Current Medications (verified): 1)  Lisinopril 40 Mg Tabs (Lisinopril) .... Take One Tablet By Mouth Daily 2)  Toprol Xl 100 Mg Xr24h-Tab (Metoprolol Succinate) .... Take One Daily 3)  Aspirin 81 Mg Chew (Aspirin) .... Take One Daily 4)  Crestor 40 Mg Tabs (Rosuvastatin Calcium) .... Take One Daily 5)  Hydrochlorothiazide 12.5 Mg Caps (Hydrochlorothiazide) .... Take One Daily 6)  Fish Oil 1000 Mg Caps (Omega-3 Fatty Acids) .... Take Two Daily  Allergies: 1)  ! Penicillin  Past History:  Past Medical History: UNSPECIFIED CEREBROVASCULAR DISEASE (ICD-437.9) HYPERLIPIDEMIA-MIXED (ICD-272.4) CORONARY ATHEROSCLEROSIS NATIVE CORONARY ARTERY (ICD-414.01) MYOCARDIAL INFARCTION, INFEROLATERAL WALL, INITIAL EPISODE (ICD-410.21) Hypertension  Past Surgical History: Reviewed history from 10/07/2008 and no changes required. Ventral Hernia Repair  Social History: Reviewed history from 10/10/2009 and no changes required. Tobacco Use -  No.  Alcohol Use - no  Review of Systems       no fevers or chills, productive cough, hemoptysis, dysphasia, odynophagia, melena, hematochezia, dysuria, hematuria, rash, seizure activity, orthopnea, PND, pedal edema, claudication. Remaining systems are negative.   Physical Exam  General:  Well-developed well-nourished in no acute distress.  Skin is warm and dry.  HEENT is normal.  Neck is supple. No thyromegaly.  Chest is clear to auscultation with normal expansion.  Cardiovascular exam is regular rate and rhythm.  Abdominal exam nontender or distended. No masses palpated. Extremities show no edema. neuro grossly intact    Vital Signs:  Patient profile:   70 year old male Height:      69 inches Weight:      172 pounds BMI:     25.49 Pulse rate:   84 / minute BP sitting:   138 / 79  Impression & Recommendations:  Problem # 1:  HYPERLIPIDEMIA-MIXED (ICD-272.4) Pt's cholesterol remains stable.  LDL increased slightly but still near goal of <70.  LFTs are WNL.  Pt tolerating medications so will continue with no changes.  Will allow pt to f/u with Dr. Jens Som in future for lipid management.    His updated medication list for this problem includes:    Crestor 40 Mg Tabs (Rosuvastatin calcium) .Marland Kitchen... Take one daily  Patient Instructions: 1)  F/u with Dr. Jens Som in 6 months- 1 year to recheck cholesterol.

## 2011-02-12 ENCOUNTER — Other Ambulatory Visit: Payer: Self-pay | Admitting: Cardiology

## 2011-03-12 NOTE — Assessment & Plan Note (Signed)
[facility]                               LIPID CLINIC NOTE   HEMI, CHACKO                        MRN:          [id number]  DATE:04/02/2007                            DOB:          03/15/1941    [name] comes in today with no complaints.  He has been compliant with  his cholesterol medicines which include Crestor 20 mg daily.  He has no  muscle aches or pains or any other problems with his medicine.  Other  medications did not change.  They include Toprol XL, Altace, Protonix,  and 81 mg aspirin.   PHYSICAL EXAMINATION:  Weight is 181 pounds, blood pressure is 150/80,  heart rate is 94.   LABORATORY DATA:  Total cholesterol 184, triglycerides 299, HDL 31.7,  LDL 99.1, liver function tests are within normal limits.   ASSESSMENT:  [name]' triglycerides, HDL, and LDL are slightly worse  than last time, and are now not at goals.  His weight is up also, 2  pounds.  His blood pressure is a little bit higher than last time, as  well as his heart rate.  [name] attributes all of this to recent  dietary and exercise indiscretions with following his son's return from  a year and a half stay in [place] for his job.  There was a lot of  celebrating, special events, lots of high-fat foods, and he did not  exercise much at all for the period leading up to this blood draw.  Mr.  [name] is reluctant to change or increase his Crestor dose.  He thinks  that with getting back with the lifestyle modifications, he will be able  to bring those numbers closer to goal.  I talked to that if this is not  so with his next followup, we would probably consider increasing the  Crestor to a maximum dose of 40 mg daily.  He was instructed to call us  with any problems or complaints until his next appointment.  We set up  his next appointment for 6 months from now.      Charolotte Eke, PharmD  Electronically Signed      Rollene Rotunda, MD, [facility]  Electronically Signed   TP/MedQ  DD: 04/02/2007  DT: 04/02/2007  Job #: [id number]

## 2011-03-12 NOTE — Assessment & Plan Note (Signed)
[facility]                            CARDIOLOGY OFFICE NOTE   [name], [name]                        MRN:          [id number]  DATE:09/28/2008                            DOB:          12-24-1940    [name] is a pleasant gentleman who has a history of coronary artery  disease.  His last cardiac catheterization on August 04, 2006, showed an  ejection fraction of 65%.  The distal LAD had a 50% stenosis at the  origin of the diagonal.  The right coronary artery stent was patent.  There is a 20% in-stent restenosis.  We have been treating medically.  [name] also has cerebrovascular disease.  Since I last saw him, [name] is doing  well.  There is no dyspnea, chest pain, palpitations, or syncope.   MEDICATIONS:  1. Toprol 100 mg p.o. daily.  2. Aspirin 81 mg p.o. daily.  3. Lisinopril 40 mg p.o. daily.  4. Crestor 40 mg p.o. daily.  5. Fish oil 1 g b.i.d.   PHYSICAL EXAMINATION:  VITAL SIGNS:  Today shows a blood pressure of  148/76 and the pulse is 71.  [name] weighs 173 pounds.  HEENT:  Normal.  NECK:  Supple.  CHEST:  Clear.  CARDIOVASCULAR:  Regular rate.  ABDOMEN:  No tenderness.  EXTREMITIES:  No edema.   His electrocardiogram shows sinus rhythm at a rate of 71.  There is left  axis deviation.  There are no ST changes noted.   DIAGNOSES:  1. Coronary artery disease - [name] is doing well with no chest      pain or shortness of breath.  [name] will continue with his aspirin,      beta-blocker, angiotensin-converting enzyme inhibitor, and statin.      [name] will also continue his diet and exercise.  2. Cerebrovascular disease - [name] will need followup carotid Dopplers in      June 2010.  [name] will continue on his aspirin and statin.  3. Hypertension - His blood pressure remains elevated.  However, [name] is      not taking his HCTZ, that I prescribed previously.  [name] has agreed      to begin 12.5 mg p.o. daily.  We will check a BMET in 1 week to      follow  his potassium and renal function.  4. Hyperlipidemia - [name] will continue on his statin and this will be      monitored in our Lipid Clinic.   I will see him back in 1 year.     Madolyn Frieze Jens Som, MD, [facility]  Electronically Signed    BSC/MedQ  DD: 09/28/2008  DT: 09/29/2008  Job #: [id number]

## 2011-03-12 NOTE — Assessment & Plan Note (Signed)
[facility]                               LIPID CLINIC NOTE   [name], [name]                        MRN:          [id number]  DATE:09/03/2007                            DOB:          Apr 15, 1941    [name] comes in today for evaluation of his hyperlipidemia therapy.  Current medications for cholesterol management include Crestor 20 mg  daily, which he has been on for quite some time.  His other medications  have not changed.  They include Toprol XL, Protonix, aspirin 81 mg, and  lisinopril 20 mg.  He has been compliant with Crestor and has been  tolerating it just fine with no muscle aches and pains to report.   PHYSICAL EXAM:  His weight is 182 pounds, blood pressure is 160/75,  heart rate is 85.   LABORATORY DATA:  Total cholesterol 169, triglycerides 231, HDL 34.9,  LDL 100.9.  Liver function tests are within normal limits.   ASSESSMENT:  [name] lipids are not at goal.  He has been hesitant  to increase his Crestor dose and has been wanting to try lifestyle  modifications.  These apparently do not seem to be enough to get his  numbers at goal.  He has been walking two miles every morning and been  staying away from fried foods and red meat.  He does have venison  occasionally and seafood broiled about two times per week.  Also of  note, [name]' blood pressure is not optimal on his current lisinopril  20 mg daily.   PLAN:  We are going to increase his Crestor from 20 mg daily to 40 mg  daily.  We asked him to let us know if he had any muscle aches or pains  with this dose increase.  On reviewing his case with Shelby Dubin,  Pharm.D., it was decided to increase his lisinopril from 20 mg daily to  40 mg daily.  I encouraged him to continue with his diet and exercise  program.  He will have a BMP drawn in about 2 weeks to assess his serum  creatinine and potassium level, which can be affected by the increase of  ACE inhibitor dosage.   We are going to see him back in the lipid clinic  in 6 weeks, at which time we will check a lipid and liver panel to see  how our Crestor dosage increase has affected his lipid panel.      Charolotte Eke, PharmD  Electronically Signed      Rollene Rotunda, MD, [facility]  Electronically Signed   TP/MedQ  DD: 09/03/2007  DT: 09/03/2007  Job #: [id number]

## 2011-03-12 NOTE — Assessment & Plan Note (Signed)
[facility]                               LIPID CLINIC NOTE   [name], [name]                        MRN:          [id number]  DATE:10/15/2007                            DOB:          1941-10-26    [name] comes in today for further management of his hyperlipidemia.  His current cholesterol medications include Crestor 40 mg daily.  He has  been compliant with this therapy and tolerating it just fine with no  muscle aches or pains.  Other medications have not changed, and they  include Toprol XL, Protonix, Aspirin 81 mg, and lisinopril.   PHYSICAL EXAM:  Weight is 182 pounds, blood pressure 155/75, heart rate  is 84.   Laboratory data includes total cholesterol 152, triglycerides 183, HDL  33.5, LDL 82.  Liver function tests were within normal limits.  On  November 20, he had a BMET which revealed a potassium of 4.3 and a serum  creatinine of 1.   ASSESSMENT:  [name]' LDL and triglycerides are both improved from his  previous visit but are still not yet at goals of LDL less than 70 and  triglycerides less than 150.  His HDL is about the same and is not at a  goal greater than 40.  We did not talk much about diet and exercise at  this visit.  At his previous visit, we decided to increase his  lisinopril from 20 mg to 40 mg daily.  His blood pressure does not seem  to be much improved with this change.  He denies any noncompliance.  He  does admit to 2-3 cups of coffee every morning, and this includes the  mornings when he comes to see [place] when his blood pressure is a little bit  elevated.  His potassium and serum creatinine appear stable with this  adjustment to his ACE inhibitor regimen.   PLAN:  We are going to continue with Crestor 40 mg daily.  We are going  to start over-the-counter fish oil capsules 1 g twice daily.  This is  something [name] was thinking about trying anyway.  We will see what  added benefit it gives [place] to  triglycerides.  His LDL is down from 100.9  to 82 after increasing Crestor to 40 mg daily, so we will see if we get  any further benefit over the coming months.  I asked him to continue  with heart-healthy diet and exercise as tolerated, and this should be  discussed further with him at his next visit.  We are going to follow up  in 6 months with a lipid and liver panel, and he was asked to call us  with any questions or concerns in the meantime.      Charolotte Eke, PharmD  Electronically Signed      Rollene Rotunda, MD, [facility]  Electronically Signed   TP/MedQ  DD: 10/15/2007  DT: 10/16/2007  Job #: [id number]

## 2011-03-12 NOTE — Assessment & Plan Note (Signed)
[facility]                            CARDIOLOGY OFFICE NOTE   [name], [name]                        MRN:          [id number]  DATE:04/09/2007                            DOB:          Apr 03, 1941    [name] is a pleasant gentleman who has a history of coronary disease.  He had a stent to his right coronary artery in the setting of an  inferior myocardial infarction in 2002.  In October 2007 he underwent  repeat catheterization secondary to chest pain.  His ejection fraction  was normal.  He had a distal LAD lesion of 50%.  There was a 20% in-  stent restenosis of his right coronary artery.  He has been treated  medically.  Since I last saw him he is doing extremely well.  There is  no dyspnea on exertion, orthopnea, PND, pedal edema, palpitations,  presyncope, syncope, or exertional chest pain.   His medications include:  1. Toprol 100 mg p.o. daily.  2. Altace 5 mg p.o. daily.  3. Crestor 20 mg p.o. daily.  4. Protonix 40 mg p.o. daily.  5. Aspirin 81 mg p.o. daily.   PHYSICAL EXAMINATION:  VITAL SIGNS:  Physical exam today shows a blood  pressure of 150/83 and his pulse is 74.  He weighs 179 pounds.  HEENT:  Normal.  NECK:  Supple.  CHEST:  Clear.  CARDIOVASCULAR:  A regular rate and rhythm.  EXTREMITIES:  No edema.   His electrocardiogram shows sinus rhythm at a rate of 69.  There is  a  left anterior fascicular block and there is poor R-wave progression.  An  anterior infarct could be excluded.   DIAGNOSES:  1. Coronary artery disease.  His most recent catheterization showed      nonobstructive disease and he has had no symptoms.  We will      continue with medical therapy including his aspirin, statin, beta      blocker and ACE inhibitor.  2. Cerebrovascular disease.  The patient had repeat carotid Dopplers      today and we will await those results.  3. Hypertension.  His blood pressure is minimally elevated today.  I  have asked him to complete his present prescription of Altace and      we will change that to lisinopril 20 mg p.o. daily.  If his blood      pressure continues to be elevated, we can increase this as needed.      We could also add a low-dose diuretic.  4. Hyperlipidemia.  He is being followed in the lipid clinic.  His      most recent LDL was greater than 70 but he is not interested in      increasing his Crestor.  5. History of minimally abnormal chest x-ray.  We will repeat a PA and      lateral today.  I will also check a BMET to follow his potassium      and renal function.   He will continue with risk factor modification and we will see  him back  in 1 year.     Madolyn Frieze Devin Som, MD, [facility]  Electronically Signed    BSC/MedQ  DD: 04/09/2007  DT: 04/09/2007  Job #: [id number]

## 2011-03-12 NOTE — Assessment & Plan Note (Signed)
[facility]                            CARDIOLOGY OFFICE NOTE   Emitt, Maglione TIMON GEISSINGER                        MRN:          [id number]  DATE:04/06/2008                            DOB:          1941/02/24    Mr. Parson is a very pleasant 70 year old gentleman who has history of  coronary artery disease.  Please refer to my previous notes for details.  Since I last saw him, he denies any dyspnea, chest pain, palpitations,  or syncope.  There is no pedal edema.  He is exercising and trying to  follow diet.  He does not smoke.   MEDICATIONS:  1. Toprol 100 mg p.o. daily.  2. Protonix 40 mg p.o. daily.  3. Aspirin 81 mg p.o. daily.  4. Lisinopril 40 mg p.o. daily.  5. Crestor 40 mg p.o. daily.   PHYSICAL EXAMINATION:  Today shows a blood pressure of 152/89.  His  pulse is 66.  HEENT:  Normal.  NECK:  Supple.  CHEST:  Clear.  CARDIOVASCULAR:  Regular rate and rhythm.  ABDOMEN:  No tenderness.  EXTREMITIES:  No edema.   His electrocardiogram shows a sinus rhythm at a rate of 71.  There is  left axis deviation.  There are no ST changes noted.   DIAGNOSES:  1. Coronary artery disease - Mr. Reep is doing well from a      symptomatic standpoint.  He will continue with medical therapy      including his aspirin, statin, beta-blocker, and ACE inhibitor.  He      will continue risk factor modifications including diet and      exercise.  He does not smoke.  2. Cerebrovascular disease - he had followup carotid Dopplers today      and we will await those results.  Most likely he will need a repeat      in 12 months.  3. Hypertension - his blood pressure is elevated today.  We will add      HCTZ 12.5 mg p.o. daily and we will check a basic metabolic panel      in 1 week and adjust as indicated.  4. Hyperlipidemia - he will continue on his Crestor and this is being      monitored in the Lipid Clinic.   We will see him back in 1 year.     Madolyn Frieze  Jens Som, MD, [facility]  Electronically Signed   BSC/MedQ  DD: 04/06/2008  DT: 04/06/2008  Job #: [id number]

## 2011-03-12 NOTE — Assessment & Plan Note (Signed)
[facility]                               LIPID CLINIC NOTE   DIALLO, PONDER                        MRN:          [id number]  DATE:04/14/2008                            DOB:          [birth year]    Mr. Gassett comes in today for followup of his hyperlipidemia therapy,  which includes Crestor 40 mg daily and fish oil 1 g twice daily.  He has  been compliant with both of these medications and is tolerating them  just fine with no muscle aches or pains to report.  His other  medications have not changed.   Physical exam includes a weight of 180 pounds, blood pressure 150/75,  and heart rate of 76.   Laboratory data includes total cholesterol of 143, triglycerides 202,  HDL 32.4, and LDL 77.7.  Liver function tests are within normal limits.  Fasting glucose was 114, potassium was 4.4, and serum creatinine was 1.0   ASSESSMENT:  Mr. Cisnero' triglycerides are still elevated greater than  150, the goal of less than 150.  His HDL is about the same and is not at  goal of greater than 45.  His LDL is improved from last time, but is not  yet at the goal of less than 70.  He has no history of diabetes, but his  fasting glucose with these labs was slightly elevated.  He reports  having attended a fiftieth high school reunion party, where there was  lots of sweets and he reports just general dietary indiscretions over  the last few weeks.  As far as exercise, he reports walking on treadmill  about 5 days out of each week for 30 minutes at a time.  In addition to  this, he does gardening and mowing the lawn.  Of note, he reports that  he still drinks regular sodas and sweet tea on a daily basis.   PLAN:  We are going to continue with Crestor 40 mg daily, which is the  maximum dose.  We are going to ask him to increase his fish oil to 3 g  per day for about a week and then increasing to 4 g per day.  He is very  resistant to TriCor or any other addition of a  prescription medicine.  We talked about his diet and how just reducing the sweets and sodas and  sweet tea could greatly impact his triglycerides.  I asked him to use  unsweetened tea and use Splenda as the sweetener, which he has not tried  before.  Also, I encouraged him to switch to diet sodas.  He seems  motivated to do this.  I encouraged him with his exercise routine, told  him to continue walking 30 minutes per day plus any other exercise he  can get.  I also encouraged him by telling him that HDL could be raised  by increased exercise.  We are going to follow up with him in 6 months,  at which time we will obtain a repeat lipid and liver panel and make any  adjustments that  are necessary at that time.   His blood pressure was slightly elevated at this visit as it was at the  last visit.  He reports seeing Dr. Jens Som within the last week and has  not yet started on the fluid pills that Dr. Jens Som  was going to prescribe for him.  I have passed this information along to  Dr. Ludwig Clarks nurse today for followup.      Charolotte Eke, PharmD  Electronically Signed      Madolyn Frieze. Jens Som, MD, [facility]  Electronically Signed   TP/MedQ  DD: 04/14/2008  DT: 04/14/2008  Job #: [id number]

## 2011-03-12 NOTE — Assessment & Plan Note (Signed)
[facility]                               LIPID CLINIC NOTE   ANASTACIO, BUA                        MRN:          [id number]  DATE:10/13/2008                            DOB:          1941/08/15    Mr. Pellow comes in today for followup of his hyperlipidemia therapy,  which includes Crestor 40 mg daily and fish oil capsules 2 g daily.  He  has been compliant with both of these therapies and is tolerating them  just fine with no muscle aches or pains.  His other medications were  reviewed and they include Toprol-XL, aspirin 81 mg, lisinopril and  hydrochlorothiazide, as well as Protonix p.r.n.   PHYSICAL EXAMINATION:  VITAL SIGNS:  His weight is 175 pounds, blood  pressure is 140/75, and heart rate is 78.   LABORATORY DATA:  Total cholesterol 159, triglycerides 192, HDL 41.1,  and LDL 80.  Liver function tests were within normal limits.  Fasting  glucose is 112.   ASSESSMENT:  Mr. Ryser' triglycerides are slightly better, but not yet  at the goal of less than 150.  His HDL is up considerably and  cholesterol with the goal greater than 45.  His LDL is up slightly and  not at the goal of less than 70.  He has been taking my advice as well  as far as cutting of sodas.  He has been replacing that with more ice  tea, but now has been sweetening his tea with Splenda and he seems to  like it okay.  I have encouraged him with those changes and also cut out  snacking again high-fat foods as well.  He has asthma with our last  visit.  He tries to exercise by walking 30 minutes most days.   PLAN:  Synthroid to maximum dose.  Crestor, we will keep at the same,  but increase his fish oil to 2 g twice a day, so increasing fish oil  from 2 g daily to 4 g per day.  He is still quite resistant to starting  another prescription medicine.  I encouraged him with the diet and  exercise, continue with those and we are going to follow up with him in  6 months and we  will recheck lipid and liver panel.      Charolotte Eke, PharmD  Electronically Signed      Rollene Rotunda, MD, [facility]  Electronically Signed   TP/MedQ  DD: 10/13/2008  DT: 10/13/2008  Job #: [id number]
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
## 2011-03-15 NOTE — Assessment & Plan Note (Signed)
[facility]                              CARDIOLOGY OFFICE NOTE   Rudolph, [name]                        MRN:          [id number]  DATE:08/19/2006                            DOB:          03-13-1941    This is a 70 year old white male patient with history of an inferior wall  myocardial infarction in 2002, treated with bare metal stent to his RCA. He  presented to [facility] with severe epigastric and chest discomfort and  ruled out for an myocardial infarction. This pain was relieved with a GI  cocktail. He did undergo cardiac catheterization, which showed widely patent  stent in the RCA. He has developed no other significant stenosis and they  felt that the pain was GI in etiology. Ejection fraction of 65%, he had a  50% diagonal that was quite small. While in the hospital, his Toprol was  increased to 100 mg a day.   The patient has done well since he has been home. He was placed on Protonix  and has had no further pain.   CURRENT MEDICATIONS:  1. Toprol XL 100 mg daily.  2. Altace 5 mg daily.  3. Crestor 20 mg daily.  4. Protonix 40 mg daily.  5. Aspirin 81 mg daily.   PHYSICAL EXAMINATION:  This is a pleasant 70 year old white male in no acute  distress. Blood pressure: 148/82. Pulse: 100. Weight: 181.1.  NECK: Without JVD, HJR, bruit or thyroid enlargement.  LUNGS:  Clear anterior, posterior and lateral.  HEART: Regular rate and rhythm at 100 beats per minute. Normal S1, S2 with  no murmur, rub, bruit, thrill or heave noted.  ABDOMEN: Soft without organomegaly, masses, lesions or abnormal tenderness.  Right groin without hematoma or hemorrhage.  EXTREMITIES: Without cyanosis, clubbing or edema. He has good distal pulses.   IMPRESSION:  1. Chest pain, felt to be GI in etiology.  2. Cardiac catheterization on August 06, 2006, widely patent stent to the      RCA with no significant other disease. Normal LV function.  3. History  of inferior wall myocardial infarction in 2002, treated with      bare metal stent to the RCA.  4. Hypertension and tachycardia. The patient had large cups of coffee this      morning and has not taken his Toprol.  5. Hyperlipidemia, treated.  6. History of tobacco abuse.   PLAN:  At this time, I have asked the patient to reduce his caffeine intake  and take his Toprol in the morning. He is to keep track of his blood  pressure and heart rate at home and he will see Dr. Jens Som back as  scheduled in December.     ______________________________  Jacolyn Reedy, PA-C    ______________________________  Bevelyn Buckles. Bensimhon, MD   ML/MedQ  DD: 08/19/2006  DT: 08/19/2006  Job #: [id number]

## 2011-03-15 NOTE — Cardiovascular Report (Signed)
[name], [name]                 ACCOUNT NO.:  [id number]   MEDICAL RECORD NO.:  [id number]          PATIENT TYPE:  INP   LOCATION:  2308                         FACILITY:  [facility]   PHYSICIAN:  Salvadore Farber, MD  DATE OF BIRTH:  Nov 14, 1940   DATE OF PROCEDURE:  08/04/2006  DATE OF DISCHARGE:  08/04/2006                              CARDIAC CATHETERIZATION   PROCEDURES:  Left heart catheterization, left ventriculography, coronary  angiography, Starclose closure of the right common femoral arteriotomy site.   INDICATIONS:  [name] is a 70 year old gentleman who suffered inferior  myocardial infarction in 2002 that was treated with bare-metal stent in his  right coronary artery.  He has generally done very nicely since then.  However, on Saturday, he had several hours of epigastric and then substernal  chest discomfort which he felt was similar to the discomfort which  accompanied his myocardial infarction.  He presented to the emergency room.  He is ruled out for myocardial infarction by serial enzymes and  electrocardiograms.  He was treated with aspirin, heparin, and eptifibatide  and had no recurrent pain.  He was referred for diagnostic angiography.   PROCEDURAL TECHNIQUE:  Informed consent was obtained.  Under 1% lidocaine  local anesthesia, a 5-French sheath was placed in the right common femoral  artery using the modified Seldinger technique.  Diagnostic angiography and  ventriculography were performed using JL-4, JR-4, and pigtail catheters.  The arteriotomy was then closed using a Starclose device.  Complete  hemostasis was obtained.  He was then transferred to the holding room in  stable condition.   COMPLICATIONS:  None.   FINDINGS:  1. LV:  128/7/13.  EF 65% without regional wall motion abnormality.  2. No aortic stenosis or mitral regurgitation.  3. Left main:  Angiographically normal.  4. LAD:  Moderate-sized vessel giving rise to one diagonal.  The distal   LAD has a 50% stenosis after the origin of the diagonal.  The      downstream vessels below this is quite small.  5. Circumflex:  Large vessel giving rise to two marginals.  The first      marginal arises very high and is large.  The second marginal is      moderate size.  There are only minor luminal irregularities in the      circumflex system.  6. RCA:  Moderate-sized dominant vessel.  There is a previously placed      stent in the midvessel.  There is approximately 20% in-stent      restenosis.   IMPRESSION/PLAN:  Widely patent stent in the right coronary artery.  He has  developed no other significant stenosis.  I suspect a gastrointestinal  etiology to his chest discomfort.      Salvadore Farber, MD  Electronically Signed     WED/MEDQ  D:  08/04/2006  T:  08/05/2006  Job:  [id number]   cc:   Madolyn Frieze. Jens Som, MD, [facility]  Rosalyn Gess. Norins, MD

## 2011-03-15 NOTE — Discharge Summary (Signed)
[name], [name]                 ACCOUNT NO.:  [id number]   MEDICAL RECORD NO.:  [id number]          PATIENT TYPE:  INP   LOCATION:  2308                         FACILITY:  [facility]   PHYSICIAN:  Salvadore Farber, MD  DATE OF BIRTH:  05-29-1941   DATE OF ADMISSION:  08/02/2006  DATE OF DISCHARGE:  08/03/2006                                 DISCHARGE SUMMARY   PROCEDURES:  1. Cardiac catheterization.  2. Coronary arteriogram.  3. Left ventriculogram.   PRIMARY DIAGNOSIS:  Chest pain, cardiac catheterization this admission  without critical coronary artery disease.   SECONDARY DIAGNOSIS:  1. Status post myocardial infarction in 2002, acute inferior myocardial      infarction.  2. Hypercholesterolemia.  3. History of tobacco use.  4. Allergy or intolerance to PENICILLIN.  5. Status post tonsillectomy, shoulder surgery, and umbilical hernia      repair.  6. Hypertension.   Time at discharge 36 minutes.   HOSPITAL COURSE:  [name] is a 70 year old male with a previous history of  coronary artery disease.  He has not had any cardiac testing done since his  MI and he had not seen Dr. Jens Som in approximately a year.  On the day of  admission, he was eating breakfast at [facility] and had substernal chest  pain.  He came to the emergency room.  His EKG was without acute ischemic  changes and his initial markers were negative, but he was admitted for  further evaluation and treatment.   His cardiac enzymes were negative for MI.  It was felt that he needed  further evaluation for ischemia and a cardiac catheterization was performed  on August 04, 2006.  The cardiac catheterization showed 20% in-stent  restenosis on his previously placed RCA stent.  He had a 50% LAD lesion  which was not flow limiting and was a small vessel.  There was no other  disease and his EF of 65% without regional wall motion abnormalities.  There  was no AS or MR.   Dr. Samule Ohm evaluated the films and  felt that since the RCA stent was widely  patent and there was no significant stenosis in any other vessels, his chest  pain was not of cardiac etiology.  Dr. Samule Ohm suspected GI etiology to his  chest discomfort.   [name] had his Toprol XL changed to Lopressor 50 mg b.i.d.  He tolerated  this dose well so he was switched back to Toprol XL at 100 mg a day of  discharge.  His Altace and Crestor were unchanged and he had a proton pump  inhibitor added to his medication regimen.  The patient does not have a  prescription for nitroglycerin because it causes hypotension as well as  nausea and vomiting and he does not feel the side effects are worth it.  Mr.  George was evaluated post cath and was without chest pain or shortness of  breath.  His groin was without ecchymosis, bruit or hematoma, and he was  considered stable for discharge on August 04, 2006, with outpatient follow-  up  arranged.   DISCHARGE INSTRUCTIONS:  His activity level is to be increased gradually.  He is to call our office for problems with the cath site.  He is to follow  up with [name] on October 23 at 9:45.  He is to follow up with  [name] as needed.   DISCHARGE MEDICATIONS:  1. Toprol XL 100 mg daily.  2. Altace 5 mg daily.  3. Crestor 20 mg daily.  4. Protonix 40 mg a day.  5. Aspirin 81 mg daily.     ______________________________  Devin Demark, George-C      Salvadore Farber, MD  Electronically Signed    RB/MEDQ  D:  08/04/2006  T:  08/05/2006  Job:  [id number]   cc:   Devin Gess. Norins, MD

## 2011-03-15 NOTE — Discharge Summary (Signed)
[place]  Patient:    [name], [name] Visit Number: [id number] MRN: [id number]          Service Type: MED Location: [phone] Attending Physician:  Veneda Melter Dictated by:   Pennelope Bracken, N.P. Admit Date:  10/09/2001 Discharge Date: 10/12/2001                             Discharge Summary  DATE OF BIRTH:  May 13, 1941  REASON FOR ADMISSION:  Chest pain.  DISCHARGE DIAGNOSES: 1. Coronary artery disease, status post acute inferior myocardial infarction. 2. Hypercholesterolemia. 3. History of tobacco abuse.  HISTORY OF PRESENT ILLNESS:  This delightful 70 year old gentleman presented to the ED per ambulance after acute onset of crushing chest pain felt focally on the right chest.  On arrival to the ED, he was found on EKG to be in sinus bradycardia at a rate of 55 with ST segment changes in inferior leads suggestive of acute infarct.  His cardiac enzymes were elevated as well.  He was treated urgently with aspirin, heparin, and nitroglycerin.  He was also begun on Integrilin in anticipation of his cardiac catheterization.  HOSPITAL COURSE:  Angiography by Dr. Chales Abrahams revealed the following:  Left main was large caliber with mild disease distally, estimated to be 30%.  The LAD of medium caliber with 30-40% proximal and midsection disease.  Mild diffuse disease was found in the diagonal.  The left circumflex was seen to be medium caliber and was the first marginal had a 60% narrowing proximally.  The right coronary artery had proximal disease estimated to be between 30-40%, then a high-grade narrowing of 99% in the midsection.  There was 40-50% disease distally.  Ventriculography revealed ejection fraction greater than 65%. Given these findings, percutaneous intervention to the right coronary artery was performed.  A stent was successfully placed here, resulting in reduction of stenosis from 99%-0%.  The patient did experience some  bradycardia during the procedure, which was successfully treated with dopamine.  He was returned to the floor in stable condition and continued to improve throughout his hospitalization.  PHYSICAL EXAMINATION:  GENERAL:  On the day of discharge, the patient offered no complaints of chest pain or dyspnea.  VITAL SIGNS:  Blood pressure 120/70, pulse 84, pulse oximetry 92% on room air, T-max 100.  CHEST:  Clear to auscultation bilaterally.  HEART:  Regular rate and rhythm without murmur, rub, or gallop.  Right groin site was somewhat ecchymotic but without bruit.  LABORATORY FINDINGS:  CBC at discharge was as follows:  WBC 9.9, hemoglobin 12.9, hematocrit 37.7, platelets 244.  Cardiac enzymes were as follows: December 15 CK was 1832, CK-MB was 273.4, and troponin-I was 25.60.  December 14 CK was 1535, CK-MB was 121.8, and troponin-I was 20.23.  The final set of enzymes showed a CK of 1099, CK-MB of 104.4, and a troponin-I of 15.29.  Lipid panel revealed an elevated total cholesterol of 222, triglycerides were high at 228, HDL low at 36, and LDL high at 140.  EKG obtained on admission, as above, showed sinus bradycardia with signs of acute inferior MI.  By discharge, a 12-lead EKG revealed normal sinus rhythm rate of 99 with some left axis deviation and signs of an old anterior infarct.  DISCHARGE MEDICATIONS: 1. Aspirin 325 one a day. 2. Plavix 75 mg 1 q.d. 3. Toprol 50 mg 1 q.d. 4. Altace 2.5 mg 1 q.d. 5.  Zocor 40 mg 1 q.d.  ACTIVITY RESTRICTIONS:  The patient is advised against driving, sexual activity, or tub baths for two days and will advance these activities as tolerated.  DIET:  Low-fat/low-salt/low-cholesterol diet.  WOUND CARE:  The patient is to call [phone] if the groin becomes hard or painful.  SPECIAL INSTRUCTIONS:  The patient will exercise 30-60 minutes every day.  He will follow up with Dr. Jens Som in four weeks, January 9, at 12:45 and have liver  function tests drawn in six weeks at [facility] on January 20.  The patient and his wife affirm their understanding of these instructions and agree to call with any change or increase in symptoms. Dictated by:   Pennelope Bracken, N.P. Attending Physician:  Veneda Melter DD:  10/12/01 TD:  10/12/01 Job: [id number]

## 2011-03-15 NOTE — Cardiovascular Report (Signed)
[place]. [facility]  Patient:    [name], [name] Visit Number: [id number] MRN: [id number]          Service Type: MED Location: 1800 1830 01 Attending Physician:  Cathren Laine Dictated by:   Veneda Melter, M.D. Proc. Date: 10/09/01 Admit Date:  10/09/2001   CC:         Madolyn Frieze. Jens Som, M.D. [facility]  Rosalyn Gess. Norins, M.D. [facility]   Cardiac Catheterization  PROCEDURE: 1. Left heart catheterization 2. Left ventriculogram. 3. Selective coronary angiography. 4. Placement of temporary right ventricular pacemaker. 5. Percutaneous transluminal coronary angioplasty and stenting of the mid    right coronary artery.  DIAGNOSES: 1. Severe single-vessel coronary artery disease. 2. Acute inferior wall myocardial infarction. 3. Normal left ventricular systolic function.  CARDIOLOGIST:  Veneda Melter, M.D.  HISTORY:  [name] is a 70 year old white male without prior cardiac history who presents with acute onset of substernal chest discomfort occurring at approximately 10:30 a.m.  The patient presented to the emergency room at about 12:15 where he was found to have ST elevation in the inferior leads consistent with an acute inferior wall myocardial infarction.  He was brought to the catheterization lab emergently.  TECHNIQUE:  After informed consent was obtained, the patient was brought to the catheterization lab.  A 7-French arterial and 6-French sheath were placed in the right groin using the modified Seldinger technique.  A temporary RV pacemaker was then advanced to the RV apex as per backup parameters program. The patient had episodic bradycardia in the emergency room with resultant hypotension.   Left heart catheterization, selective coronary angiography, left ventriculogram were then performed in the usual fashion using preformed 6-French Judkins catheters.  INITIAL FINDINGS: 1. Left main trunk: A large caliber vessel.  There is mild disease with a  distal taper of 30%. 2. LAD.  This is a medium caliber vessel that provides two major diagonal    branches.  The LAD has moderate disease of 30 to 40% in the proximal and    mid section.  The first diagonal branch is a medium caliber vessel with    mild disease of 30%.  The second diagonal branch is a large caliber vessel    that bifurcates at its proximal segment and has moderate diffuse disease    of 30 to 40%. 3. Left circumflex artery: This is a medium caliber vessel that consists of a    medium size first marginal branch in the mid section and two small    posterolateral branches distally.  The first marginal branch has a long    narrowing of 60% in the proximal and mid section.  The remainder of the    left circumflex system has mild irregularities. 4. Ramus intermedius: This is a medium caliber vessel with mild disease of    30% in the proximal and mid section. 4. Right coronary artery is dominant.  This is a medium caliber vessel that    provides a large posterior descending artery and and two posterior    ventricular branches in its terminal segment.   The right coronary artery    has moderate disease of 30 to 40% in the proximal segment.  There is then    a high-grade narrowing of 99% in the mid section after an RV marginal    branch.  Moderate disease of 40 to 50% will be seen later in the distal    segment.  There is TIMI-1 flow in the distal  RCA.  The distal vasculature    will be seen later and will be noted to have mild diffuse disease in the    branch vessels.  LEFT VENTRICULOGRAPHY:  Normal end-systolic and end-diastolic dimensions. Overall left ventricular function well preserved, ejection fraction greater than 65%.  No mitral regurgitation.  Akinesis of the inferior wall  is noted.  LV pressure 120/10, aortic 120/70.  LV EDP equals 30.  INTERVENTION:  With these findings, we elected to proceed with percutaneous intervention to the right coronary artery.  The patient  had received heparin and Integrilin in the emergency room, and this was supplemented to maintain ACT at approximately 250 seconds.  He was also given 300 mg of Plavix orally. A 7-French JR4 guide catheter was used to engage the right coronary artery, and a 0.014 inch extra support wire was advanced to the distal RCA.  A 2.5 x 15 mm CrossSail balloon was introduced and used to predilate the lesion at 8 atmospheres for 30 seconds.  Repeat angiography showed only mild improvement in vessel lumen with evidence of a dissection cap.  A 3.0 x 18 mm AVE S7 stent was introduced and carefully positioned in the mid RCA just distal to the RV marginal branch to cover the lesion.  This was deployed at 12 atmospheres for 60 seconds.  Repeat angiography was then performed after the administration of intracoronary nitroglycerin showing an excellent result with full coverage of the lesion, no residual stenosis, and TIMI-3 flow through the right coronary artery.  The patient did become hypotensive during the case, requiring initiation of dopamine therapy.  The patient had significant chest discomfort and ST elevation at the initiation of the case which was resolved by the end of the case.  Final angiography performed in various projections showed TIMI-3 flow through the right coronary artery and no evidence of distal vessel damage.  The guide catheter was then removed. The sheaths were secured into position.  The temporary wire was left in place and will be removed when the sheath is removed after the ACT returns to normal.  He was then transferred to the CCU in stable condition.  FINAL RESULTS:  Successful PTCA and stenting of the mid right coronary artery with reduction of subtotal 99% narrowing to 0% with placement of a 3.0 x 18 mm AVE S7. Dictated by:   Veneda Melter, M.D. Attending Physician:  Cathren Laine DD:  10/09/01 TD:  10/09/01 Job: [id number]

## 2011-03-15 NOTE — H&P (Signed)
[place]. [facility]  Patient:    [name], [name] Visit Number: [id number] MRN: [id number]          Service Type: MED Location: 1800 1830 01 Attending Physician:  Cathren Laine Dictated by:   Madolyn Frieze. Jens Som, M.D. [facility] Admit Date:  10/09/2001                           History and Physical  HISTORY OF PRESENT ILLNESS:  The patient is a 70 year old male with a past medical history of ventral hernia repair as well as tonsillectomy.  He presents with chest pain and acute inferior myocardial infarction.  He has no prior cardiac history.  This morning he was eating at [facility].  He subsequently became nauseated and developed substernal chest pain.  The pain was not associated with shortness of breath but there was nausea and diaphoresis.  The pain did not radiate to the back or to the shoulders.  It was not pleuritic.  He came to the emergency room and his electrocardiogram revealed inferior ST elevation.  For details of his past medical history, social history, family history, and review of systems, please refer to the note handwritten by Gene Serpe, P.A.  PHYSICAL EXAMINATION:  VITAL SIGNS:  Blood pressure 107/68, pulse 63.  He is afebrile.  His respiratory rate is 18.  He is 98% on 100% rebreather.  GENERAL:  He is well-developed and well-nourished, in mild distress.  HEENT:  Unremarkable with normal eyelids.  SKIN:  Warm and dry.  NECK:  Supple with normal upstroke bilaterally and there are no bruits noted. There is no jugular venous distention, no thyromegaly noted.  CHEST:  Clear to auscultation with normal expansion.  CARDIOVASCULAR:  Reveals a regular rate and rhythm.  His heart sounds are somewhat distant but I can appreciate no murmurs.  ABDOMEN:  Not tender or distended with positive bowel sounds.  No hepatosplenomegaly and no masses appreciated.  There is no abdominal bruit. He has 2+ femoral pulses bilaterally and no  bruits.  EXTREMITIES:  Show no edema.  I can palpate no cords.  He has 2+ posterior tibial pulses bilaterally.  NEUROLOGIC:  Grossly intact.  LABORATORY DATA:  His electrocardiogram today shows a normal sinus rhythm with an acute inferior injury pattern.  DIAGNOSIS:  Acute inferior myocardial infarction.  PLAN:  [name] presents with an acute inferior infarct and his chest pain has now been present for approximately two-and-a-half hours.  We will plan aspirin, heparin, and nitroglycerin.  We will plan emergent catheterization and intervention as indicated.  I have discussed the risks and benefits with both he and his family and they agree to proceed.  We will add beta blocker as well as Altace if tolerated by pulse and blood pressure following his catheterization.  I have asked them to add Integrilin acutely.  We will check a fasting lipid panel and treat as indicated.  Dictated by:   Madolyn Frieze. Jens Som, M.D. [facility] Attending Physician:  Cathren Laine DD:  10/09/01 TD:  10/09/01 Job: [id number]

## 2011-03-15 NOTE — Op Note (Signed)
[place]. [facility]  Patient:    [name], [name] Visit Number: [id number] MRN: [id number]          Service Type: DSU Location: [facility] Attending Physician:  Arlis Porta Proc. Date: 06/25/01 Admit Date:  06/25/2001   CC:         Rosalyn Gess. Norins, M.D. [facility]   Operative Report  PREOPERATIVE DIAGNOSIS:  Ventral hernia.  POSTOPERATIVE DIAGNOSIS: 1. Incarcerated ventral hernia. 2. Abdominal wall mole.  OPERATION PERFORMED: 1. Repair of incarcerated ventral hernia with mesh. 2. Excision of abdominal wall mole.  SURGEON:  [name], M.D.  ANESTHESIA:  General with injection of local (Marcaine).  INDICATIONS FOR PROCEDURE:  The patient is a 70 year old male who has noticed a painful bulge in the epigastric region and his clinical exam was consistent with a primary ventral incisional hernia.  While he was on the operating table and after being sterilely prepped and draped, the incision appeared like it would transect a mole in the skin and thus, I made the decision to excise this and sent it off for pathology.  DESCRIPTION OF PROCEDURE:  He was placed supine on the operating table and a general anesthetic was administered by way of a laryngeal mask anesthesia. The epigastric region was shaved and sterilely prepped and draped.  The hernia had previously been marked.  Local anesthetic was infiltrated superficially and deep and a longitudinal incision made directly over the area of the hernia.  The incision was carried down to the fascia with the cautery.  I could see incarcerated omental fat protruding through a defect approximately 15 mm in size.  I was able to reduce the incarcerated omentum after incising some of the peritoneum.  I subsequently isolated fascia by dissecting free subcutaneous fat from it and in a circular fashion around the defect and closed the defect primarily with interrupted Surgilon sutures.  I then placed an onlay  piece of polypropylene mesh over the primary repair and anchored it to the anterior fascia with interrupted Surgilon sutures.  This provided more than adequate coverage.  Next, I closed the subcutaneous tissue over the mesh with running 3-0 Vicryl suture.  I then excised the mole which was near the right edge of the skin and sent it to pathology.  The skin was closed with 4-0 Monocryl subcuticular stitch followed by Steri-Strips and sterile dressings.  The patient tolerated the procedure well without any apparent complications and was taken to the recovery room in satisfactory condition. Attending Physician:  Arlis Porta DD:  06/25/01 TD:  06/25/01 Job: [id number]
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
## 2011-03-15 NOTE — Assessment & Plan Note (Signed)
[facility]                               LIPID CLINIC NOTE   [name], [name]                        MRN:          [id number]  DATE:10/09/2006                            DOB:          01-04-1941    PAST MEDICAL HISTORY:  1. Coronary artery disease status post stent to the RCA with      documented 20% instant restenosis as of October 2007.  2. Cerebrovascular disease documented by carotid Doppler.  3. Hyperlipidemia.   MEDICATIONS:  1. Toprol 100 mg daily.  2. Altace 5 mg daily.  3. Aspirin 81 mg daily.  4. Crestor 20 mg daily.  5. Protonix 40 mg daily.   VITAL SIGNS:  Weight 179 pounds.  Blood pressure was 134/70.  Heart rate  80.  LAB DATA:  Total cholesterol 149.  Triglycerides 139.  HDL 38.  LDL 83.  LFTs within normal limits.   ASSESSMENT:  [name] is a pleasant gentleman who returns to lipid  clinic today with no chest pain, no shortness of breath, no muscle aches  or pains.  He states compliance with current medication regimen.  He  walks an hour almost every day, which is a significant increase since  last visit, he was walking 30 minutes 3x a week.  This change is  reflected in his lipid profile, where he has decreased his triglycerides  to goal of less than 150, increased his HDL from 33 to 38, although  slightly below goal of greater than 40, and significantly decreased his  LDL from 95 to 83, although slightly above goal of less than 70.  He  also has decreased the sodas in his diet.  He occasionally drinks a  soda, where as before he was drinking 1 to 2 Pepsi's a day, and eating  high amounts of carbohydrates and snacking throughout the day.  He now  is very compliant with a low-carbohydrate diet.  He does not eat fried  foods on a daily basis.  He occasionally goes to [employer] and has  fried shrimp, but that is approximately once a month.  He understands  the need to reach his LDL goal of less than 70, given his  documented  instant restenosis as of October.  However, he is adamant that he does  not want to take anymore medication, and does not want to add on other  medications.  I have encouraged him to continue his lifestyle  modification over the next 6 months, and if he is not at goal LDL and  HDL at that time, that medication changes will need to be made.   PLAN:  1. Continue Crestor 20 mg daily.  2. Continue lifestyle modification of increasing exercise and      decreasing carbohydrates and      fats in diet.  3. Follow up in 6 months for lipid panel and LFTs.  Will make      adjustments at that time.      Leota Sauers, PharmD  Electronically Signed      Jesse Sans. Wall,  MD, [facility]  Electronically Signed   LC/MedQ  DD: 10/09/2006  DT: 10/09/2006  Job #: [id number]

## 2011-03-19 ENCOUNTER — Other Ambulatory Visit: Payer: Self-pay | Admitting: Cardiology

## 2011-04-18 ENCOUNTER — Other Ambulatory Visit: Payer: Self-pay | Admitting: Cardiology

## 2011-04-18 DIAGNOSIS — I6529 Occlusion and stenosis of unspecified carotid artery: Secondary | ICD-10-CM

## 2011-04-19 ENCOUNTER — Encounter: Payer: Self-pay | Admitting: Cardiology

## 2011-04-19 ENCOUNTER — Encounter (INDEPENDENT_AMBULATORY_CARE_PROVIDER_SITE_OTHER): Payer: Medicare Other | Admitting: *Deleted

## 2011-04-19 DIAGNOSIS — I6529 Occlusion and stenosis of unspecified carotid artery: Secondary | ICD-10-CM

## 2011-04-21 ENCOUNTER — Other Ambulatory Visit: Payer: Self-pay | Admitting: Cardiology

## 2011-09-15 ENCOUNTER — Other Ambulatory Visit: Payer: Self-pay | Admitting: Cardiology

## 2011-09-16 ENCOUNTER — Other Ambulatory Visit: Payer: Self-pay

## 2011-09-16 MED ORDER — ROSUVASTATIN CALCIUM 40 MG PO TABS: 40.0000 mg | ORAL_TABLET | Freq: Every day | ORAL | Status: DC

## 2011-09-17 ENCOUNTER — Other Ambulatory Visit: Payer: Self-pay | Admitting: Cardiology

## 2011-10-09 ENCOUNTER — Encounter: Payer: Self-pay | Admitting: *Deleted

## 2011-10-10 ENCOUNTER — Encounter: Payer: Self-pay | Admitting: Cardiology

## 2011-10-10 ENCOUNTER — Ambulatory Visit (INDEPENDENT_AMBULATORY_CARE_PROVIDER_SITE_OTHER): Payer: Medicare Other | Admitting: Cardiology

## 2011-10-10 ENCOUNTER — Encounter: Payer: Self-pay | Admitting: *Deleted

## 2011-10-10 DIAGNOSIS — E7800 Pure hypercholesterolemia, unspecified: Secondary | ICD-10-CM

## 2011-10-10 DIAGNOSIS — I10 Essential (primary) hypertension: Secondary | ICD-10-CM

## 2011-10-10 DIAGNOSIS — I6529 Occlusion and stenosis of unspecified carotid artery: Secondary | ICD-10-CM

## 2011-10-10 DIAGNOSIS — I2510 Atherosclerotic heart disease of native coronary artery without angina pectoris: Secondary | ICD-10-CM

## 2011-10-10 LAB — BASIC METABOLIC PANEL
BUN: 12 mg/dL (ref 6–23)
Creatinine, Ser: 1.1 mg/dL (ref 0.4–1.5)
GFR: 69.19 mL/min (ref >60.00)
Glucose, Bld: 80 mg/dL (ref 70–99)

## 2011-10-10 LAB — HEPATIC FUNCTION PANEL
ALT: 15 U/L (ref 0–53)
AST: 21 U/L (ref 0–37)
Alkaline Phosphatase: 60 U/L (ref 39–117)
Bilirubin, Direct: 0.1 mg/dL (ref 0.0–0.3)
Total Bilirubin: 0.5 mg/dL (ref 0.3–1.2)
Total Protein: 7.4 g/dL (ref 6.0–8.3)

## 2011-10-10 LAB — LIPID PANEL
Cholesterol: 141 mg/dL (ref 0–200)
VLDL: 37.4 mg/dL (ref 0.0–40.0)

## 2011-10-10 NOTE — Patient Instructions (Signed)
Your physician wants you to follow-up in: ONE YEAR You will receive a reminder letter in the mail two months in advance. If you don't receive a letter, please call our office to schedule the follow-up appointment.   Your physician recommends that you return for lab work in: TODAY  Your physician has requested that you have a carotid duplex. This test is an ultrasound of the carotid arteries in your neck. It looks at blood flow through these arteries that supply the brain with blood. Allow one hour for this exam. There are no restrictions or special instructions.

## 2011-10-10 NOTE — Assessment & Plan Note (Signed)
Continue statin. Check lipids and liver. 

## 2011-10-10 NOTE — Assessment & Plan Note (Signed)
Blood pressure controlled. Continue present medications. Check potassium and renal function. 

## 2011-10-10 NOTE — Assessment & Plan Note (Signed)
Continue aspirin and statin. 

## 2011-10-10 NOTE — Assessment & Plan Note (Signed)
Continue aspirin and statin. Schedule followup carotid Dopplers. 

## 2011-10-10 NOTE — Progress Notes (Signed)
   HPI:[name] is a pleasant gentleman who has a history of coronary artery disease.  His last cardiac catheterization on August 04, 2006, showed an ejection fraction of 65%.  The distal LAD had a 50% stenosis at the origin of the diagonal.  The right coronary artery stent was patent. There is a 20% in-stent restenosis. Last carotid Dopplers in June of 2012 and showed a 60-79% left stenosis and a chronically occluded right internal carotid artery. Followup was recommended in six months. Note an abdominal ultrasound in May 2005 showed no aneurysm. Myoview in February of 2011 showed an ejection fraction of 70%, fixed inferior defect and no ischemia. I last saw him in December of 2011. Since then the patient denies any dyspnea on exertion, orthopnea, PND, pedal edema, palpitations, syncope or chest pain.  Current Outpatient Prescriptions  Medication Sig Dispense Refill  . aspirin 81 MG tablet Take 81 mg by mouth daily.        . fish oil-omega-3 fatty acids 1000 MG capsule Take 2 g by mouth 3 (three) times daily.        . hydrochlorothiazide (MICROZIDE) 12.5 MG capsule Take 12.5 mg by mouth daily.        Marland Kitchen lisinopril (PRINIVIL,ZESTRIL) 40 MG tablet TAKE ONE TABLET BY MOUTH ONE TIME DAILY  30 tablet  4  . metoprolol (TOPROL-XL) 100 MG 24 hr tablet TAKE ONE TABLET BY MOUTH ONE TIME DAILY  30 tablet  6  . rosuvastatin (CRESTOR) 40 MG tablet Take 1 tablet (40 mg total) by mouth daily.  30 tablet  2     Past Medical History  Diagnosis Date  . CVD (cerebrovascular disease)   . Hyperlipidemia   . Coronary atherosclerosis of native coronary artery   . Heart attack     inferolateral wall,, intial episode  . HTN (hypertension)     Past Surgical History  Procedure Date  . Hernia repair     ventral    History   Social History  . Marital Status: Married    Spouse Name: N/A    Number of Children: N/A  . Years of Education: N/A   Occupational History  . Not on file.   Social History Main  Topics  . Smoking status: Former Games developer  . Smokeless tobacco: Not on file  . Alcohol Use: No  . Drug Use: Not on file  . Sexually Active: Not on file   Other Topics Concern  . Not on file   Social History Narrative  . No narrative on file    ROS: no fevers or chills, productive cough, hemoptysis, dysphasia, odynophagia, melena, hematochezia, dysuria, hematuria, rash, seizure activity, orthopnea, PND, pedal edema, claudication. Remaining systems are negative.  Physical Exam: Well-developed well-nourished in no acute distress.  Skin is warm and dry.  HEENT is normal.  Neck is supple. No thyromegaly.  Chest is clear to auscultation with normal expansion.  Cardiovascular exam is regular rate and rhythm.  Abdominal exam nontender or distended. No masses palpated. Extremities show no edema. neuro grossly intact  ECG NSR, LAD, RVCD

## 2011-10-30 ENCOUNTER — Encounter (INDEPENDENT_AMBULATORY_CARE_PROVIDER_SITE_OTHER): Payer: Medicare Other | Admitting: *Deleted

## 2011-10-30 DIAGNOSIS — I6529 Occlusion and stenosis of unspecified carotid artery: Secondary | ICD-10-CM

## 2011-11-10 ENCOUNTER — Other Ambulatory Visit: Payer: Self-pay | Admitting: Cardiology

## 2011-11-11 ENCOUNTER — Other Ambulatory Visit: Payer: Self-pay

## 2011-11-11 MED ORDER — METOPROLOL SUCCINATE ER 100 MG PO TB24: 200.0000 mg | ORAL_TABLET | Freq: Every day | ORAL | Status: DC

## 2011-12-16 ENCOUNTER — Other Ambulatory Visit: Payer: Self-pay | Admitting: Cardiology

## 2012-02-05 ENCOUNTER — Other Ambulatory Visit: Payer: Self-pay | Admitting: Cardiology

## 2012-03-17 ENCOUNTER — Telehealth: Payer: Self-pay | Admitting: *Deleted

## 2012-03-17 MED ORDER — METOPROLOL SUCCINATE ER 100 MG PO TB24: 100.0000 mg | ORAL_TABLET | Freq: Every day | ORAL | Status: DC

## 2012-03-17 NOTE — Telephone Encounter (Signed)
Confirmed with the pt, he is taking metoprolol er 100 mg once daily.

## 2012-05-13 ENCOUNTER — Other Ambulatory Visit: Payer: Self-pay | Admitting: Cardiology

## 2012-05-14 ENCOUNTER — Other Ambulatory Visit: Payer: Self-pay | Admitting: *Deleted

## 2012-05-14 DIAGNOSIS — I6529 Occlusion and stenosis of unspecified carotid artery: Secondary | ICD-10-CM

## 2012-05-26 ENCOUNTER — Encounter (INDEPENDENT_AMBULATORY_CARE_PROVIDER_SITE_OTHER): Payer: Medicare Other

## 2012-05-26 DIAGNOSIS — I6529 Occlusion and stenosis of unspecified carotid artery: Secondary | ICD-10-CM

## 2012-06-04 ENCOUNTER — Other Ambulatory Visit: Payer: Self-pay | Admitting: Cardiology

## 2012-09-02 ENCOUNTER — Other Ambulatory Visit: Payer: Self-pay | Admitting: Cardiology

## 2012-09-08 ENCOUNTER — Other Ambulatory Visit: Payer: Self-pay | Admitting: Cardiology

## 2012-10-30 ENCOUNTER — Ambulatory Visit (INDEPENDENT_AMBULATORY_CARE_PROVIDER_SITE_OTHER): Payer: Medicare Other | Admitting: Cardiology

## 2012-10-30 ENCOUNTER — Encounter: Payer: Self-pay | Admitting: *Deleted

## 2012-10-30 ENCOUNTER — Encounter: Payer: Self-pay | Admitting: Cardiology

## 2012-10-30 VITALS — BP 145/84 | HR 97 | Ht 69.0 in | Wt 172.0 lb

## 2012-10-30 DIAGNOSIS — I2510 Atherosclerotic heart disease of native coronary artery without angina pectoris: Secondary | ICD-10-CM | POA: Diagnosis not present

## 2012-10-30 DIAGNOSIS — I10 Essential (primary) hypertension: Secondary | ICD-10-CM

## 2012-10-30 LAB — BASIC METABOLIC PANEL
CO2: 31 mEq/L (ref 19–32)
Chloride: 100 mEq/L (ref 96–112)
Glucose, Bld: 108 mg/dL — ABNORMAL HIGH (ref 70–99)
Sodium: 137 mEq/L (ref 135–145)

## 2012-10-30 LAB — LIPID PANEL
HDL: 38.1 mg/dL — ABNORMAL LOW (ref >39.00)
LDL Cholesterol: 66 mg/dL (ref 0–99)
Total CHOL/HDL Ratio: 4
Triglycerides: 181 mg/dL — ABNORMAL HIGH (ref 0.0–149.0)

## 2012-10-30 LAB — HEPATIC FUNCTION PANEL
Albumin: 4 g/dL (ref 3.5–5.2)
Total Bilirubin: 0.7 mg/dL (ref 0.3–1.2)

## 2012-10-30 NOTE — Patient Instructions (Addendum)

## 2012-10-30 NOTE — Progress Notes (Signed)
   HPI: Mr. Swaim is a pleasant gentleman who has a history of coronary artery disease. His last cardiac catheterization on August 04, 2006, showed an ejection fraction of 65%. The distal LAD had a 50% stenosis at the origin of the diagonal. The right coronary artery stent was patent. There is a 20% in-stent restenosis. Last carotid Dopplers in July 2013 showed a 60-79% left stenosis and a chronically occluded right internal carotid artery. Followup was recommended in 12 months. Note an abdominal ultrasound in May 2005 showed no aneurysm. Myoview in February of 2011 showed an ejection fraction of 70%, fixed inferior defect and no ischemia. I last saw him in December of 2012. Since then the patient denies any dyspnea on exertion, orthopnea, PND, pedal edema, palpitations, syncope or chest pain.   Current Outpatient Prescriptions  Medication Sig Dispense Refill  . aspirin 81 MG tablet Take 81 mg by mouth daily.        . CRESTOR 40 MG tablet TAKE ONE TABLET BY MOUTH ONE TIME DAILY  30 tablet  2  . fish oil-omega-3 fatty acids 1000 MG capsule Take 2 g by mouth 3 (three) times daily.        Marland Kitchen lisinopril (PRINIVIL,ZESTRIL) 40 MG tablet TAKE ONE TABLET BY MOUTH ONE TIME DAILY  30 tablet  1  . metoprolol succinate (TOPROL-XL) 100 MG 24 hr tablet Take 1 tablet (100 mg total) by mouth daily. Take with or immediately following a meal.  30 tablet  11     Past Medical History  Diagnosis Date  . CVD (cerebrovascular disease)   . Hyperlipidemia   . Coronary atherosclerosis of native coronary artery   . Heart attack     inferolateral wall,, intial episode  . HTN (hypertension)     Past Surgical History  Procedure Date  . Hernia repair     ventral    History   Social History  . Marital Status: Married    Spouse Name: N/A    Number of Children: N/A  . Years of Education: N/A   Occupational History  . Not on file.   Social History Main Topics  . Smoking status: Former Games developer  . Smokeless  tobacco: Not on file  . Alcohol Use: No  . Drug Use: Not on file  . Sexually Active: Not on file   Other Topics Concern  . Not on file   Social History Narrative  . No narrative on file    ROS: no fevers or chills, productive cough, hemoptysis, dysphasia, odynophagia, melena, hematochezia, dysuria, hematuria, rash, seizure activity, orthopnea, PND, pedal edema, claudication. Remaining systems are negative.  Physical Exam: Well-developed well-nourished in no acute distress.  Skin is warm and dry.  HEENT is normal.  Neck is supple.  Chest is clear to auscultation with normal expansion.  Cardiovascular exam is regular rate and rhythm.  Abdominal exam nontender or distended. No masses palpated. Extremities show no edema. neuro grossly intact  ECG sinus rhythm at a rate of 97. Left anterior fascicular block. No ST changes.

## 2012-10-30 NOTE — Assessment & Plan Note (Signed)
Continue aspirin and statin. Schedule Myoview for risk stratification. 

## 2012-10-30 NOTE — Assessment & Plan Note (Signed)
Continue aspirin and statin. Followup carotid Dopplers July 2014. 

## 2012-10-30 NOTE — Assessment & Plan Note (Signed)
Continue statin. Check lipids and liver. 

## 2012-10-30 NOTE — Assessment & Plan Note (Signed)
Continue present blood pressure medications. Check potassium and renal function. 

## 2012-11-04 ENCOUNTER — Ambulatory Visit (HOSPITAL_COMMUNITY): Payer: Medicare Other | Attending: Cardiovascular Disease | Admitting: Radiology

## 2012-11-04 VITALS — Ht 69.0 in | Wt 171.0 lb

## 2012-11-04 DIAGNOSIS — R062 Wheezing: Secondary | ICD-10-CM

## 2012-11-04 DIAGNOSIS — R0609 Other forms of dyspnea: Secondary | ICD-10-CM | POA: Insufficient documentation

## 2012-11-04 DIAGNOSIS — I2510 Atherosclerotic heart disease of native coronary artery without angina pectoris: Secondary | ICD-10-CM

## 2012-11-04 DIAGNOSIS — I10 Essential (primary) hypertension: Secondary | ICD-10-CM | POA: Diagnosis not present

## 2012-11-04 DIAGNOSIS — R0989 Other specified symptoms and signs involving the circulatory and respiratory systems: Secondary | ICD-10-CM | POA: Insufficient documentation

## 2012-11-04 DIAGNOSIS — R0602 Shortness of breath: Secondary | ICD-10-CM

## 2012-11-04 MED ORDER — REGADENOSON 0.4 MG/5ML IV SOLN
0.4000 mg | Freq: Once | INTRAVENOUS | Status: AC
  Administered 2012-11-04: 0.4 mg via INTRAVENOUS

## 2012-11-04 MED ORDER — TECHNETIUM TC 99M SESTAMIBI GENERIC - CARDIOLITE
10.0000 | Freq: Once | INTRAVENOUS | Status: AC | PRN
  Administered 2012-11-04: 10 via INTRAVENOUS

## 2012-11-04 MED ORDER — TECHNETIUM TC 99M SESTAMIBI GENERIC - CARDIOLITE
30.0000 | Freq: Once | INTRAVENOUS | Status: AC | PRN
  Administered 2012-11-04: 30 via INTRAVENOUS

## 2012-11-04 MED ORDER — ALBUTEROL SULFATE (5 MG/ML) 0.5% IN NEBU
5.0000 mg | INHALATION_SOLUTION | Freq: Once | RESPIRATORY_TRACT | Status: AC
  Administered 2012-11-04: 5 mg via RESPIRATORY_TRACT

## 2012-11-04 NOTE — Progress Notes (Signed)
[facility] SITE 3 NUCLEAR MED [address] [place], [place] [address] [phone]    Cardiology Nuclear Med Study  [name] is a 72 y.o. male     MRN : [id number]     DOB: 18-Jun-1941  Procedure Date: 11/04/2012  Nuclear Med Background Indication for Stress Test:  Evaluation for Ischemia and Stent Patency History:  Asthma, '02 IWMI> Stent RCA, '07 Heart Catheterization-Patent RCA stent(20% restenosis) with residual 50% LAD, EF=65%, '11 Myocardial Perfusion Study-Fixed inferior defect, no ischemia, EF=70% Cardiac Risk Factors: Carotid Disease, History of Smoking, Hypertension and Lipids  Symptoms:  DOE   Nuclear Pre-Procedure Caffeine/Decaff Intake:  None > 12 hrs NPO After: 7:00pm   Lungs:  Minimal expiratory wheeze, much improved after Nebulizer treatment O2 Sat: 94-98% on room air. IV 0.9% NS with Angio Cath:  20g  IV Site: R Antecubital x 1, tolerated well IV Started by:  Irean Hong, RN  Chest Size (in):  40 Cup Size: n/a  Height: 5\' 9"  (1.753 m)  Weight:  171 lb (77.565 kg)  BMI:  Body mass index is 25.25 kg/(m^2). Tech Comments:  Took Toprol at 6:00am this morning.On arrival audible wheezes with mild inspiratory and expiratory wheezing, O2 Sat 94%RA. Nebulizer treatment with albuterol 5 mg solution given via mask with 8L oxygen. Patient tolerated well with decrease wheezing in lung fields.Irean Hong, RN    Nuclear Med Study 1 or 2 day study: 1 day  Stress Test Type:  Eugenie Birks  Reading MD: Kristeen Miss, MD  Order Authorizing Provider:  Olga Millers, MD  Resting Radionuclide: Technetium 63m Sestamibi  Resting Radionuclide Dose: 11.0 mCi   Stress Radionuclide:  Technetium 54m Sestamibi  Stress Radionuclide Dose: 33.0 mCi           Stress Protocol Rest HR: 84 Stress HR: 104  Rest BP: 122/61 Stress BP: 133/74  Exercise Time (min): n/a METS: n/a   Predicted Max HR: 147 bpm % Max HR: 70.07 bpm Rate Pressure Product: 63845    Dose of Adenosine  (mg):  n/a Dose of Lexiscan: 0.4 mg  Dose of Atropine (mg): n/a Dose of Dobutamine: n/a mcg/kg/min (at max HR)  Stress Test Technologist: Irean Hong, RN  Nuclear Technologist:  Domenic Polite, CNMT     Rest Procedure:  Myocardial perfusion imaging was performed at rest 45 minutes following the intravenous administration of Technetium 49m Sestamibi. Rest ECG: NSR, Left axis deviation, pulmonary disease pattern  Stress Procedure:  The patient received IV Lexiscan 0.4 mg over 15-seconds.  Technetium 64m Sestamibi injected at 30-seconds. The patient complained of stomach pain, but denied chest pain. Quantitative spect images were obtained after a 45 minute delay. Stress ECG: No significant change from baseline ECG  QPS Raw Data Images:  Normal; no motion artifact; normal heart/lung ratio. Stress Images:  Normal homogeneous uptake in all areas of the myocardium. Rest Images:  Normal homogeneous uptake in all areas of the myocardium. Subtraction (SDS):  No evidence of ischemia. Transient Ischemic Dilatation (Normal <1.22):  1.02 Lung/Heart Ratio (Normal <0.45):  0.33  Quantitative Gated Spect Images QGS EDV:  64 ml QGS ESV:  17 ml  Impression Exercise Capacity:  Lexiscan with no exercise. BP Response:  Normal blood pressure response. Clinical Symptoms:  No significant symptoms noted. ECG Impression:  No significant ST segment change suggestive of ischemia. Comparison with Prior Nuclear Study: No significant change from previous study done 11/30/09 ( EF 70% at that time)  Overall Impression:  Normal stress  nuclear study.  No evidence of ischemia.  LV Ejection Fraction: 74%.  LV Wall Motion:  NL LV Function; NL Wall Motion.    Vesta Mixer, Montez Hageman., MD, [facility] 11/04/2012, 5:43 PM Office - [phone] Pager [phone]

## 2012-11-11 ENCOUNTER — Other Ambulatory Visit: Payer: Self-pay | Admitting: Cardiology

## 2012-12-01 ENCOUNTER — Other Ambulatory Visit: Payer: Self-pay | Admitting: Cardiology

## 2012-12-01 NOTE — Telephone Encounter (Signed)
Fax Received. Refill Completed. Batu Cassin Chowoe (R.M.A)   

## 2012-12-31 ENCOUNTER — Other Ambulatory Visit: Payer: Self-pay | Admitting: Cardiology

## 2013-01-07 ENCOUNTER — Other Ambulatory Visit: Payer: Self-pay | Admitting: *Deleted

## 2013-01-07 MED ORDER — LISINOPRIL 40 MG PO TABS: 40.0000 mg | ORAL_TABLET | Freq: Every day | ORAL | Status: DC

## 2013-01-08 ENCOUNTER — Other Ambulatory Visit: Payer: Self-pay

## 2013-01-08 MED ORDER — LISINOPRIL 40 MG PO TABS: 40.0000 mg | ORAL_TABLET | Freq: Every day | ORAL | Status: DC

## 2013-01-11 ENCOUNTER — Other Ambulatory Visit: Payer: Self-pay | Admitting: *Deleted

## 2013-01-11 MED ORDER — LISINOPRIL 40 MG PO TABS: 40.0000 mg | ORAL_TABLET | Freq: Every day | ORAL | Status: DC

## 2013-01-30 ENCOUNTER — Other Ambulatory Visit: Payer: Self-pay | Admitting: Cardiology

## 2013-05-26 ENCOUNTER — Encounter (INDEPENDENT_AMBULATORY_CARE_PROVIDER_SITE_OTHER): Payer: Medicare Other

## 2013-05-26 DIAGNOSIS — I6529 Occlusion and stenosis of unspecified carotid artery: Secondary | ICD-10-CM

## 2013-05-30 ENCOUNTER — Other Ambulatory Visit: Payer: Self-pay | Admitting: Cardiology

## 2013-06-27 ENCOUNTER — Other Ambulatory Visit: Payer: Self-pay | Admitting: Cardiology

## 2013-07-15 ENCOUNTER — Other Ambulatory Visit: Payer: Self-pay | Admitting: Cardiology

## 2013-08-13 ENCOUNTER — Other Ambulatory Visit: Payer: Self-pay | Admitting: Cardiology

## 2013-10-14 ENCOUNTER — Telehealth: Payer: Self-pay | Admitting: Internal Medicine

## 2013-10-14 ENCOUNTER — Other Ambulatory Visit: Payer: Self-pay | Admitting: Cardiology

## 2013-10-14 NOTE — Telephone Encounter (Signed)
No appt at all today, pt is going to urgent care.

## 2013-10-14 NOTE — Telephone Encounter (Signed)
Pt called stated that he might be allergic to something that he ate because his mouth is red (discomfort). Last ov was 2011, no appointment open today. Please advise.

## 2013-10-14 NOTE — Telephone Encounter (Signed)
PM clinic with Dr. Jonny Ruiz or Urgent Care

## 2013-10-14 NOTE — Telephone Encounter (Signed)
fyi

## 2013-10-16 ENCOUNTER — Encounter (HOSPITAL_COMMUNITY): Payer: Self-pay | Admitting: Emergency Medicine

## 2013-10-16 ENCOUNTER — Emergency Department (INDEPENDENT_AMBULATORY_CARE_PROVIDER_SITE_OTHER)
Admission: EM | Admit: 2013-10-16 | Discharge: 2013-10-16 | Disposition: A | Discharge status: Home / Self Care | Payer: Medicare Other | Source: Home / Self Care | Attending: Family Medicine | Admitting: Family Medicine

## 2013-10-16 DIAGNOSIS — B370 Candidal stomatitis: Secondary | ICD-10-CM | POA: Diagnosis not present

## 2013-10-16 MED ORDER — NYSTATIN 100000 UNIT/ML MT SUSP: 500000.0000 [IU] | Freq: Four times a day (QID) | OROMUCOSAL | Status: DC

## 2013-10-16 NOTE — ED Notes (Signed)
Pt noticed white patches on tongue and redness inside mouth/lips onset 4 days He denies: f/v/n/d, pain, itching.... Wears dentures.  He is alert w/no signs of acute distress.

## 2013-10-16 NOTE — ED Provider Notes (Signed)
CSN: [id number]     Arrival date & time 10/16/13  0911 History   First MD Initiated Contact with Patient 10/16/13 [phone]     Chief Complaint  Patient presents with  . Oral Swelling   (Consider location/radiation/quality/duration/timing/severity/associated sxs/prior Treatment) HPI Comments: 24 hours of tongue tenderness with redness of oral mucous membrane and "white stuff on my tongue." Patient wears upper and lower dentures. No fever. No swelling of lips or tongue. Able to speak and swallow without difficulty. Normal immune status.  The history is provided by the patient.    Past Medical History  Diagnosis Date  . CVD (cerebrovascular disease)   . Hyperlipidemia   . Coronary atherosclerosis of native coronary artery   . Heart attack     inferolateral wall,, intial episode  . HTN (hypertension)    Past Surgical History  Procedure Laterality Date  . Hernia repair      ventral   No family history on file. History  Substance Use Topics  . Smoking status: Former Games developer  . Smokeless tobacco: Not on file  . Alcohol Use: No    Review of Systems  All other systems reviewed and are negative.    Allergies  Penicillins  Home Medications   Current Outpatient Rx  Name  Route  Sig  Dispense  Refill  . aspirin 81 MG tablet   Oral   Take 81 mg by mouth daily.           . CRESTOR 40 MG tablet      TAKE ONE TABLET BY MOUTH ONE TIME DAILY   30 tablet   6   . lisinopril (PRINIVIL,ZESTRIL) 40 MG tablet      TAKE 1 TABLET (40 MG TOTAL) BY MOUTH DAILY.   30 tablet   3   . metoprolol succinate (TOPROL-XL) 100 MG 24 hr tablet      TAKE 1 TABLET BY MOUTH EVERY DAY IMMEDIATELY FOLLOWING A MEAL   30 tablet   5   . CRESTOR 40 MG tablet      TAKE ONE TABLET BY MOUTH ONE TIME DAILY   30 tablet   1   . CRESTOR 40 MG tablet      TAKE ONE TABLET BY MOUTH ONE TIME DAILY   30 tablet   1   . CRESTOR 40 MG tablet      TAKE 1 TABLET DAILY   30 tablet   6   . fish  oil-omega-3 fatty acids 1000 MG capsule   Oral   Take 2 g by mouth 3 (three) times daily.           Marland Kitchen nystatin (MYCOSTATIN) 100000 UNIT/ML suspension   Oral   Take 5 mLs (500,000 Units total) by mouth 4 (four) times daily. Swish and swallow x 7 days   150 mL   0    BP 177/93  Pulse 78  Temp(Src) 98.5 F (36.9 C) (Oral)  Resp 18  SpO2 95% Physical Exam  Constitutional: He is oriented to person, place, and time. He appears well-developed and well-nourished. No distress.  HENT:  Head: Normocephalic and atraumatic.  Mouth/Throat: Uvula is midline. He has dentures. No trismus in the jaw. Posterior oropharyngeal erythema present.  White adherent exudate on posterior tongue and posterior oropharynx  Eyes: Conjunctivae are normal. No scleral icterus.  Neck: Normal range of motion. Neck supple. No thyromegaly present.  Cardiovascular: Normal rate, regular rhythm and normal heart sounds.   Pulmonary/Chest: Effort normal.  Abdominal:  There is no tenderness.  Musculoskeletal: Normal range of motion.  Lymphadenopathy:    He has no cervical adenopathy.  Neurological: He is alert and oriented to person, place, and time.  Skin: Skin is warm and dry.  Psychiatric: He has a normal mood and affect. His behavior is normal.    ED Course  Procedures (including critical care time) Labs Review Labs Reviewed - No data to display Imaging Review No results found.  EKG Interpretation    Date/Time:    Ventricular Rate:    PR Interval:    QRS Duration:   QT Interval:    QTC Calculation:   R Axis:     Text Interpretation:              MDM   1. Thrush    Advised regarding cleaning of dentures. Will treat for thrush and advise PCP follow up if no improvement.    Jess Barters [place], [place] 10/16/13 1046

## 2013-10-17 NOTE — ED Provider Notes (Signed)
Medical screening examination/treatment/procedure(s) were performed by a resident physician or non-physician practitioner and as the supervising physician I was immediately available for consultation/collaboration.  Chelsea Pedretti, MD    Alisen Marsiglia S Arlicia Paquette, MD 10/17/13 0835 

## 2013-11-02 ENCOUNTER — Ambulatory Visit: Payer: Medicare Other | Admitting: Cardiology

## 2013-11-15 ENCOUNTER — Encounter: Payer: Self-pay | Admitting: Cardiology

## 2013-11-15 ENCOUNTER — Ambulatory Visit (INDEPENDENT_AMBULATORY_CARE_PROVIDER_SITE_OTHER): Payer: Medicare Other | Admitting: Cardiology

## 2013-11-15 VITALS — BP 146/72 | HR 83 | Ht 69.0 in | Wt 165.0 lb

## 2013-11-15 DIAGNOSIS — I679 Cerebrovascular disease, unspecified: Secondary | ICD-10-CM | POA: Diagnosis not present

## 2013-11-15 DIAGNOSIS — I2510 Atherosclerotic heart disease of native coronary artery without angina pectoris: Secondary | ICD-10-CM

## 2013-11-15 NOTE — Assessment & Plan Note (Signed)
Continue aspirin and statin. Followup carotid Dopplers July 2015. 

## 2013-11-15 NOTE — Assessment & Plan Note (Signed)
Continue aspirin and statin. 

## 2013-11-15 NOTE — Assessment & Plan Note (Signed)
Continue present blood pressure medications. Check potassium and renal function. 

## 2013-11-15 NOTE — Patient Instructions (Signed)
Your physician wants you to follow-up in: ONE YEAR WITH DR CRENSHAW You will receive a reminder letter in the mail two months in advance. If you don't receive a letter, please call our office to schedule the follow-up appointment.   Your physician recommends that you return for lab work WHEN FASTING 

## 2013-11-15 NOTE — Progress Notes (Signed)
      HPI: [name] is a pleasant gentleman who has a history of coronary artery disease. His last cardiac catheterization on August 04, 2006, showed an ejection fraction of 65%. The distal LAD had a 50% stenosis at the origin of the diagonal. The right coronary artery stent was patent. There is a 20% in-stent restenosis. Nuclear study in January of 2014 showed an ejection fraction of 74% and no ischemia. Last carotid Dopplers in July 2014 showed a 60-79% left stenosis and a chronically occluded right internal carotid artery. Followup was recommended in 12 months. Note an abdominal ultrasound in May 2005 showed no aneurysm. Myoview in February of 2011 showed an ejection fraction of 70%, fixed inferior defect and no ischemia. I last saw him in Jan 2014. Since then the patient denies any dyspnea on exertion, orthopnea, PND, pedal edema, palpitations, syncope or chest pain.   Current Outpatient Prescriptions  Medication Sig Dispense Refill  . aspirin 81 MG tablet Take 81 mg by mouth daily.        . CRESTOR 40 MG tablet TAKE 1 TABLET DAILY  30 tablet  6  . lisinopril (PRINIVIL,ZESTRIL) 40 MG tablet TAKE 1 TABLET (40 MG TOTAL) BY MOUTH DAILY.  30 tablet  3  . metoprolol succinate (TOPROL-XL) 100 MG 24 hr tablet TAKE 1 TABLET BY MOUTH EVERY DAY IMMEDIATELY FOLLOWING A MEAL  30 tablet  5   No current facility-administered medications for this visit.     Past Medical History  Diagnosis Date  . CVD (cerebrovascular disease)   . Hyperlipidemia   . Coronary atherosclerosis of native coronary artery   . Heart attack     inferolateral wall,, intial episode  . HTN (hypertension)     Past Surgical History  Procedure Laterality Date  . Hernia repair      ventral    History   Social History  . Marital Status: Married    Spouse Name: N/A    Number of Children: N/A  . Years of Education: N/A   Occupational History  . Not on file.   Social History Main Topics  . Smoking status: Former  Games developer  . Smokeless tobacco: Not on file  . Alcohol Use: No  . Drug Use: Not on file  . Sexual Activity: Not on file   Other Topics Concern  . Not on file   Social History Narrative  . No narrative on file    ROS: no fevers or chills, productive cough, hemoptysis, dysphasia, odynophagia, melena, hematochezia, dysuria, hematuria, rash, seizure activity, orthopnea, PND, pedal edema, claudication. Remaining systems are negative.  Physical Exam: Well-developed well-nourished in no acute distress.  Skin is warm and dry.  HEENT is normal.  Neck is supple.  Chest is clear to auscultation with normal expansion.  Cardiovascular exam is regular rate and rhythm.  Abdominal exam nontender or distended. No masses palpated. Extremities show no edema. neuro grossly intact  ECG sinus rhythm at a rate of 83. Left anterior fascicular block. Septal infarct.

## 2013-11-15 NOTE — Assessment & Plan Note (Signed)
Continue statin. Check lipids and liver. 

## 2013-11-17 ENCOUNTER — Other Ambulatory Visit (INDEPENDENT_AMBULATORY_CARE_PROVIDER_SITE_OTHER): Payer: Medicare Other

## 2013-11-17 ENCOUNTER — Encounter: Payer: Self-pay | Admitting: *Deleted

## 2013-11-17 DIAGNOSIS — I2510 Atherosclerotic heart disease of native coronary artery without angina pectoris: Secondary | ICD-10-CM | POA: Diagnosis not present

## 2013-11-17 LAB — BASIC METABOLIC PANEL
BUN: 15 mg/dL (ref 6–23)
CALCIUM: 9.6 mg/dL (ref 8.4–10.5)
CO2: 29 meq/L (ref 19–32)
Chloride: 101 mEq/L (ref 96–112)
Creatinine, Ser: 1 mg/dL (ref 0.4–1.5)
GFR: 79.42 mL/min (ref >60.00)
GLUCOSE: 102 mg/dL — AB (ref 70–99)
POTASSIUM: 4 meq/L (ref 3.5–5.1)
SODIUM: 136 meq/L (ref 135–145)

## 2013-11-17 LAB — HEPATIC FUNCTION PANEL
ALK PHOS: 67 U/L (ref 39–117)
ALT: 16 U/L (ref 0–53)
AST: 19 U/L (ref 0–37)
Albumin: 4 g/dL (ref 3.5–5.2)
Bilirubin, Direct: 0.1 mg/dL (ref 0.0–0.3)
TOTAL PROTEIN: 7.8 g/dL (ref 6.0–8.3)
Total Bilirubin: 0.5 mg/dL (ref 0.3–1.2)

## 2013-11-17 LAB — LIPID PANEL
CHOLESTEROL: 132 mg/dL (ref 0–200)
HDL: 38.6 mg/dL — AB (ref >39.00)
LDL Cholesterol: 60 mg/dL (ref 0–99)
Total CHOL/HDL Ratio: 3
Triglycerides: 165 mg/dL — ABNORMAL HIGH (ref 0.0–149.0)
VLDL: 33 mg/dL (ref 0.0–40.0)

## 2013-12-02 ENCOUNTER — Other Ambulatory Visit: Payer: Self-pay | Admitting: Cardiology

## 2013-12-03 ENCOUNTER — Other Ambulatory Visit: Payer: Self-pay

## 2013-12-03 MED ORDER — METOPROLOL SUCCINATE ER 100 MG PO TB24: ORAL_TABLET | ORAL | Status: DC

## 2013-12-03 MED ORDER — LISINOPRIL 40 MG PO TABS: ORAL_TABLET | ORAL | Status: DC

## 2013-12-03 MED ORDER — ROSUVASTATIN CALCIUM 40 MG PO TABS: ORAL_TABLET | ORAL | Status: DC

## 2014-01-13 ENCOUNTER — Encounter: Payer: Self-pay | Admitting: Internal Medicine

## 2014-01-13 ENCOUNTER — Ambulatory Visit (INDEPENDENT_AMBULATORY_CARE_PROVIDER_SITE_OTHER): Payer: Medicare Other | Admitting: Internal Medicine

## 2014-01-13 ENCOUNTER — Telehealth: Payer: Self-pay

## 2014-01-13 VITALS — BP 150/92 | HR 80 | Temp 98.0°F | Wt 165.4 lb

## 2014-01-13 DIAGNOSIS — I2510 Atherosclerotic heart disease of native coronary artery without angina pectoris: Secondary | ICD-10-CM | POA: Diagnosis not present

## 2014-01-13 DIAGNOSIS — H7390 Unspecified disorder of tympanic membrane, unspecified ear: Secondary | ICD-10-CM | POA: Diagnosis not present

## 2014-01-13 DIAGNOSIS — H7190 Unspecified cholesteatoma, unspecified ear: Secondary | ICD-10-CM | POA: Diagnosis not present

## 2014-01-13 DIAGNOSIS — H7391 Unspecified disorder of tympanic membrane, right ear: Secondary | ICD-10-CM

## 2014-01-13 NOTE — Telephone Encounter (Signed)
Ok with me 

## 2014-01-13 NOTE — Patient Instructions (Signed)
Thanks for coming in to see me.  The right ear drum appears abnormal: thick yellowish appearance at the posterior aspect of the ear drum, thickening of the ear drum, loss of normal landmarks of the middle ear. Question of a cholesteatoma vs other abnormal growth. 95% change of this being benign.  Plan Referral to [facility]NT for evaluation.   Mouth - no sign of thrush. The post nasal drainage and cough may be the contributing factor.  Plan  Trial of generic Claritin

## 2014-01-13 NOTE — Progress Notes (Signed)
Pre visit review using our clinic review tool, if applicable. No additional management support is needed unless otherwise documented below in the visit note. 

## 2014-01-13 NOTE — Telephone Encounter (Signed)
This patient is hoping to be transferred to Dr.Jones as a new patient.  He stated his wife is a patient of Dr.Jones, which is why he is hoping to be seen also.    Is this okay?

## 2014-01-14 NOTE — Telephone Encounter (Signed)
Left message informing patient.   Thanks so much!

## 2014-01-15 DIAGNOSIS — H7391 Unspecified disorder of tympanic membrane, right ear: Secondary | ICD-10-CM | POA: Insufficient documentation

## 2014-01-15 NOTE — Assessment & Plan Note (Signed)
The right ear drum appears abnormal: thick yellowish appearance at the posterior aspect of the ear drum, thickening of the ear drum, loss of normal landmarks of the middle ear. Question of a cholesteatoma vs other abnormal growth.   Plan Referral to [facility] for evaluation.

## 2014-01-15 NOTE — Progress Notes (Signed)
   Subjective:    Patient ID: [name], male    DOB: February 08, 1941, 73 y.o.   MRN: [id number]  HPI [name] presents with a complaint of ear pressure on the right for about a month. His wife reports that there has been discharge or drainage on his pillow. He denies frank pain, endorses some decreased hearing. Has not had an URI, has not had any air travel. He has no h/o barotrauma, no severe allergies.  PMH, FamHx and SocHx reviewed for any changes and relevance.  Current Outpatient Prescriptions on File Prior to Visit  Medication Sig Dispense Refill  . aspirin 81 MG tablet Take 81 mg by mouth daily.        Marland Kitchen lisinopril (PRINIVIL,ZESTRIL) 40 MG tablet TAKE 1 TABLET (40 MG TOTAL) BY MOUTH DAILY.  90 tablet  1  . metoprolol succinate (TOPROL-XL) 100 MG 24 hr tablet TAKE 1 TABLET BY MOUTH EVERY DAY IMMEDIATELY FOLLOWING A MEAL  90 tablet  1  . rosuvastatin (CRESTOR) 40 MG tablet TAKE 1 TABLET DAILY  30 tablet  6   No current facility-administered medications on file prior to visit.      Review of Systems System review is negative for any constitutional, cardiac, pulmonary, GI or neuro symptoms or complaints other than as described in the HPI.     Objective:   Physical Exam Filed Vitals:   01/13/14 1200  BP: 150/92  Pulse: 80  Temp: 98 F (36.7 C)   gen'l   WNWD man in no distress HEENT   Right EAC is clear. The TM is abnormal with a thick, yellow mass posterior aspect, thickening with obscuration of the middle ear landmarks. No frank inflammation.       Assessment & Plan:

## 2014-01-17 DIAGNOSIS — H60399 Other infective otitis externa, unspecified ear: Secondary | ICD-10-CM | POA: Diagnosis not present

## 2014-01-17 DIAGNOSIS — H9319 Tinnitus, unspecified ear: Secondary | ICD-10-CM | POA: Diagnosis not present

## 2014-02-04 DIAGNOSIS — H52229 Regular astigmatism, unspecified eye: Secondary | ICD-10-CM | POA: Diagnosis not present

## 2014-02-04 DIAGNOSIS — H5231 Anisometropia: Secondary | ICD-10-CM | POA: Diagnosis not present

## 2014-02-04 DIAGNOSIS — H524 Presbyopia: Secondary | ICD-10-CM | POA: Diagnosis not present

## 2014-02-04 DIAGNOSIS — H2510 Age-related nuclear cataract, unspecified eye: Secondary | ICD-10-CM | POA: Diagnosis not present

## 2014-05-12 DIAGNOSIS — H60399 Other infective otitis externa, unspecified ear: Secondary | ICD-10-CM | POA: Diagnosis not present

## 2014-05-23 ENCOUNTER — Other Ambulatory Visit (HOSPITAL_COMMUNITY): Payer: Self-pay | Admitting: Cardiology

## 2014-05-23 DIAGNOSIS — I6529 Occlusion and stenosis of unspecified carotid artery: Secondary | ICD-10-CM

## 2014-05-27 ENCOUNTER — Ambulatory Visit (HOSPITAL_COMMUNITY): Payer: Medicare Other | Attending: Cardiology | Admitting: *Deleted

## 2014-05-27 DIAGNOSIS — I6529 Occlusion and stenosis of unspecified carotid artery: Secondary | ICD-10-CM | POA: Diagnosis not present

## 2014-05-27 NOTE — Progress Notes (Signed)
Carotid duplex complete 

## 2014-06-08 ENCOUNTER — Other Ambulatory Visit: Payer: Self-pay

## 2014-06-08 MED ORDER — METOPROLOL SUCCINATE ER 100 MG PO TB24: ORAL_TABLET | ORAL | Status: DC

## 2014-07-08 ENCOUNTER — Other Ambulatory Visit: Payer: Self-pay

## 2014-07-08 MED ORDER — ROSUVASTATIN CALCIUM 40 MG PO TABS
ORAL_TABLET | ORAL | Status: DC
Start: 2014-07-08 — End: 2015-01-25

## 2014-08-02 ENCOUNTER — Other Ambulatory Visit: Payer: Self-pay | Admitting: *Deleted

## 2014-08-02 MED ORDER — LISINOPRIL 40 MG PO TABS: ORAL_TABLET | ORAL | Status: DC

## 2014-10-26 ENCOUNTER — Other Ambulatory Visit: Payer: Self-pay | Admitting: *Deleted

## 2014-10-26 MED ORDER — LISINOPRIL 40 MG PO TABS: ORAL_TABLET | ORAL | Status: DC

## 2014-11-16 NOTE — Progress Notes (Signed)
      HPI: [name] is a pleasant gentleman who has a history of coronary artery disease. His last cardiac catheterization on August 04, 2006, showed an ejection fraction of 65%. The distal LAD had a 50% stenosis at the origin of the diagonal. The right coronary artery stent was patent. There is a 20% in-stent restenosis. Nuclear study in January of 2014 showed an ejection fraction of 74% and no ischemia. Last carotid Dopplers in August 2015 showed a 60-79% left stenosis and a chronically occluded right internal carotid artery. Followup was recommended in 12 months. Note an abdominal ultrasound in May 2005 showed no aneurysm. Since I last saw him, the patient denies any dyspnea on exertion, orthopnea, PND, pedal edema, palpitations, syncope or chest pain.   Current Outpatient Prescriptions  Medication Sig Dispense Refill  . aspirin 81 MG tablet Take 81 mg by mouth daily.      Marland Kitchen lisinopril (PRINIVIL,ZESTRIL) 40 MG tablet TAKE 1 TABLET (40 MG TOTAL) BY MOUTH DAILY. 90 tablet 4  . metoprolol succinate (TOPROL-XL) 100 MG 24 hr tablet TAKE 1 TABLET BY MOUTH EVERY DAY IMMEDIATELY FOLLOWING A MEAL 90 tablet 1  . Multiple Vitamin (MULTIVITAMIN) tablet Take 1 tablet by mouth daily.    . Omega-3 Fatty Acids (FISH OIL) 1000 MG CAPS Take 3 capsules by mouth daily.    . rosuvastatin (CRESTOR) 40 MG tablet TAKE 1 TABLET DAILY 30 tablet 6   No current facility-administered medications for this visit.     Past Medical History  Diagnosis Date  . CVD (cerebrovascular disease)   . Hyperlipidemia   . Coronary atherosclerosis of native coronary artery   . Heart attack     inferolateral wall,, intial episode  . HTN (hypertension)     Past Surgical History  Procedure Laterality Date  . Hernia repair      ventral    History   Social History  . Marital Status: Married    Spouse Name: N/A    Number of Children: N/A  . Years of Education: N/A   Occupational History  . Not on file.   Social  History Main Topics  . Smoking status: Former Games developer  . Smokeless tobacco: Not on file  . Alcohol Use: No  . Drug Use: Not on file  . Sexual Activity: Not on file   Other Topics Concern  . Not on file   Social History Narrative    ROS: no fevers or chills, productive cough, hemoptysis, dysphasia, odynophagia, melena, hematochezia, dysuria, hematuria, rash, seizure activity, orthopnea, PND, pedal edema, claudication. Remaining systems are negative.  Physical Exam: Well-developed well-nourished in no acute distress.  Skin is warm and dry.  HEENT is normal.  Neck is supple.  Chest is clear to auscultation with normal expansion.  Cardiovascular exam is regular rate and rhythm.  Abdominal exam nontender or distended. No masses palpated. Extremities show no edema. neuro grossly intact  ECG sinus rhythm at a rate of 87. Left anterior fascicular block. No significant ST changes.

## 2014-11-21 ENCOUNTER — Encounter: Payer: Self-pay | Admitting: Cardiology

## 2014-11-21 ENCOUNTER — Ambulatory Visit (INDEPENDENT_AMBULATORY_CARE_PROVIDER_SITE_OTHER): Payer: Medicare Other | Admitting: Cardiology

## 2014-11-21 DIAGNOSIS — I2510 Atherosclerotic heart disease of native coronary artery without angina pectoris: Secondary | ICD-10-CM | POA: Diagnosis not present

## 2014-11-21 MED ORDER — HYDROCHLOROTHIAZIDE 12.5 MG PO CAPS: 12.5000 mg | ORAL_CAPSULE | Freq: Every day | ORAL | Status: DC

## 2014-11-21 NOTE — Assessment & Plan Note (Signed)
Continue statin. Check lipids and liver. 

## 2014-11-21 NOTE — Patient Instructions (Signed)
Your physician wants you to follow-up in: ONE YEAR WITH DR Shelda Pal will receive a reminder letter in the mail two months in advance. If you don't receive a letter, please call our office to schedule the follow-up appointment.   START HCTZ 12.5 MG ONCE DAILY  Your physician recommends that you return for lab work in: ONE WEEK=DO NOT EAT PRIOR TO LAB WORK

## 2014-11-21 NOTE — Assessment & Plan Note (Signed)
Continue aspirin and statin. 

## 2014-11-21 NOTE — Assessment & Plan Note (Signed)
Blood pressure elevated. Add HCTZ 12.5 mg daily. Check potassium and renal function in 1 week. Follow blood pressure at home and increase medications as needed.

## 2014-11-21 NOTE — Assessment & Plan Note (Signed)
Continue aspirin and statin. Check carotid Dopplers August 2016.

## 2014-11-28 ENCOUNTER — Encounter: Payer: Self-pay | Admitting: Internal Medicine

## 2014-11-30 DIAGNOSIS — I2510 Atherosclerotic heart disease of native coronary artery without angina pectoris: Secondary | ICD-10-CM | POA: Diagnosis not present

## 2014-11-30 LAB — HEPATIC FUNCTION PANEL
ALT: 18 U/L (ref 0–53)
AST: 22 U/L (ref 0–37)
Albumin: 4.1 g/dL (ref 3.5–5.2)
Alkaline Phosphatase: 80 U/L (ref 39–117)
Bilirubin, Direct: 0.1 mg/dL (ref 0.0–0.3)
Indirect Bilirubin: 0.4 mg/dL (ref 0.2–1.2)
Total Bilirubin: 0.5 mg/dL (ref 0.2–1.2)
Total Protein: 7.4 g/dL (ref 6.0–8.3)

## 2014-11-30 LAB — LIPID PANEL
CHOL/HDL RATIO: 3.4 ratio
Cholesterol: 139 mg/dL (ref 0–200)
HDL: 41 mg/dL (ref >39)
LDL Cholesterol: 72 mg/dL (ref 0–99)
Triglycerides: 128 mg/dL (ref <150)
VLDL: 26 mg/dL (ref 0–40)

## 2014-11-30 LAB — BASIC METABOLIC PANEL WITHOUT GFR
BUN: 18 mg/dL (ref 6–23)
CO2: 32 meq/L (ref 19–32)
Calcium: 10.1 mg/dL (ref 8.4–10.5)
Chloride: 96 meq/L (ref 96–112)
Creat: 1.02 mg/dL (ref 0.50–1.35)
GFR, Est African American: 83 mL/min
GFR, Est Non African American: 72 mL/min
Glucose, Bld: 107 mg/dL — ABNORMAL HIGH (ref 70–99)
Potassium: 4.1 meq/L (ref 3.5–5.3)
Sodium: 139 meq/L (ref 135–145)

## 2014-12-01 ENCOUNTER — Encounter: Payer: Self-pay | Admitting: *Deleted

## 2014-12-12 ENCOUNTER — Other Ambulatory Visit: Payer: Self-pay | Admitting: Nurse Practitioner

## 2014-12-12 MED ORDER — METOPROLOL SUCCINATE ER 100 MG PO TB24: ORAL_TABLET | ORAL | Status: DC

## 2015-01-11 ENCOUNTER — Telehealth: Payer: Self-pay

## 2015-01-11 NOTE — Telephone Encounter (Signed)
pts wife stated patient has declined taking flu vaccine

## 2015-01-25 ENCOUNTER — Other Ambulatory Visit: Payer: Self-pay | Admitting: *Deleted

## 2015-01-25 MED ORDER — ROSUVASTATIN CALCIUM 40 MG PO TABS: ORAL_TABLET | ORAL | Status: DC

## 2015-10-24 ENCOUNTER — Other Ambulatory Visit: Payer: Self-pay

## 2015-10-24 DIAGNOSIS — I2510 Atherosclerotic heart disease of native coronary artery without angina pectoris: Secondary | ICD-10-CM

## 2015-10-24 MED ORDER — HYDROCHLOROTHIAZIDE 12.5 MG PO CAPS: 12.5000 mg | ORAL_CAPSULE | Freq: Every day | ORAL | Status: DC

## 2015-10-24 MED ORDER — LISINOPRIL 40 MG PO TABS: ORAL_TABLET | ORAL | Status: DC

## 2015-10-24 MED ORDER — METOPROLOL SUCCINATE ER 100 MG PO TB24: ORAL_TABLET | ORAL | Status: DC

## 2015-11-06 ENCOUNTER — Other Ambulatory Visit: Payer: Self-pay | Admitting: Cardiology

## 2015-11-06 NOTE — Telephone Encounter (Signed)
Rx request sent to pharmacy.  

## 2015-11-15 ENCOUNTER — Other Ambulatory Visit: Payer: Self-pay

## 2015-11-15 NOTE — Telephone Encounter (Signed)
Approved      Disp Refills Start End    metoprolol succinate (TOPROL-XL) 100 MG 24 hr tablet 90 tablet 3 10/24/2015     Sig:  TAKE 1 TABLET BY MOUTH EVERY DAY IMMEDIATELY FOLLOWING A MEAL    Class:  Normal    DAW:  No    Authorizing Provider:  Lewayne Bunting, MD    Ordering User:  Drue Dun, CMA    lisinopril (PRINIVIL,ZESTRIL) 40 MG tablet 90 tablet 3 10/24/2015     Sig:  TAKE 1 TABLET (40 MG TOTAL) BY MOUTH DAILY.    Class:  Normal    DAW:  No    Authorizing Provider:  Lewayne Bunting, MD    Ordering User:  Drue Dun, CMA    hydrochlorothiazide (MICROZIDE) 12.5 MG capsule 90 capsule 3 10/24/2015 11/06/2015    Sig - Route:  Take 1 capsule (12.5 mg total) by mouth daily. - Oral    Class:  Normal    DAW:  No    Authorizing Provider:  Lewayne Bunting, MD    Ordering User:  Drue Dun, CMA      Visit Pharmacy     [facility] [phone] - [place], [place] - [address]

## 2015-11-20 ENCOUNTER — Other Ambulatory Visit: Payer: Self-pay | Admitting: *Deleted

## 2015-11-20 MED ORDER — METOPROLOL SUCCINATE ER 100 MG PO TB24: ORAL_TABLET | ORAL | Status: DC

## 2015-11-20 MED ORDER — LISINOPRIL 40 MG PO TABS: ORAL_TABLET | ORAL | Status: DC

## 2015-11-20 MED ORDER — HYDROCHLOROTHIAZIDE 12.5 MG PO CAPS
12.5000 mg | ORAL_CAPSULE | Freq: Every day | ORAL | Status: DC
Start: 2015-11-20 — End: 2016-05-10

## 2015-11-23 NOTE — Progress Notes (Signed)
      HPI: FU coronary artery disease. His last cardiac catheterization on August 04, 2006, showed an ejection fraction of 65%. The distal LAD had a 50% stenosis at the origin of the diagonal. The right coronary artery stent was patent. There is a 20% in-stent restenosis. Nuclear study in January of 2014 showed an ejection fraction of 74% and no ischemia. Last carotid Dopplers in August 2015 showed a 60-79% left stenosis and a chronically occluded right internal carotid artery. Followup was recommended in 12 months. Note an abdominal ultrasound in May 2005 showed no aneurysm. Since I last saw him, the patient has dyspnea with more extreme activities but not with routine activities. It is relieved with rest. It is not associated with chest pain. There is no orthopnea, PND or pedal edema. There is no syncope or palpitations. There is no exertional chest pain.   Current Outpatient Prescriptions  Medication Sig Dispense Refill  . aspirin 81 MG tablet Take 81 mg by mouth daily.      . CRESTOR 40 MG tablet Take 40 mg by mouth daily.  10  . hydrochlorothiazide (MICROZIDE) 12.5 MG capsule Take 1 capsule (12.5 mg total) by mouth daily. 90 capsule 0  . lisinopril (PRINIVIL,ZESTRIL) 40 MG tablet TAKE 1 TABLET (40 MG TOTAL) BY MOUTH DAILY. 90 tablet 0  . metoprolol succinate (TOPROL-XL) 100 MG 24 hr tablet TAKE 1 TABLET BY MOUTH EVERY DAY IMMEDIATELY FOLLOWING A MEAL 90 tablet 0  . Multiple Vitamin (MULTIVITAMIN) tablet Take 1 tablet by mouth daily.    . Omega-3 Fatty Acids (FISH OIL) 1000 MG CAPS Take 3 capsules by mouth daily.     No current facility-administered medications for this visit.     Past Medical History  Diagnosis Date  . CVD (cerebrovascular disease)   . Hyperlipidemia   . Coronary atherosclerosis of native coronary artery   . Heart attack (HCC)     inferolateral wall,, intial episode  . HTN (hypertension)     Past Surgical History  Procedure Laterality Date  . Hernia repair     ventral    Social History   Social History  . Marital Status: Married    Spouse Name: N/A  . Number of Children: N/A  . Years of Education: N/A   Occupational History  . Not on file.   Social History Main Topics  . Smoking status: Former Games developer  . Smokeless tobacco: Former Neurosurgeon    Types: Chew  . Alcohol Use: No  . Drug Use: Not on file  . Sexual Activity: Not on file   Other Topics Concern  . Not on file   Social History Narrative    Family History  Problem Relation Age of Onset  . Family history unknown: Yes    ROS: no fevers or chills, productive cough, hemoptysis, dysphasia, odynophagia, melena, hematochezia, dysuria, hematuria, rash, seizure activity, orthopnea, PND, pedal edema, claudication. Remaining systems are negative.  Physical Exam: Well-developed well-nourished in no acute distress.  Skin is warm and dry.  HEENT is normal.  Neck is supple.  Chest is clear to auscultation with normal expansion.  Cardiovascular exam is regular rate and rhythm.  Abdominal exam nontender or distended. No masses palpated. Extremities show no edema. neuro grossly intact  ECG Sinus rhythm at a rate of 75. Left anterior fascicular block. Lateral T-wave inversion.

## 2015-11-27 ENCOUNTER — Ambulatory Visit (INDEPENDENT_AMBULATORY_CARE_PROVIDER_SITE_OTHER): Payer: Medicare Other | Admitting: Cardiology

## 2015-11-27 ENCOUNTER — Encounter: Payer: Self-pay | Admitting: Cardiology

## 2015-11-27 VITALS — BP 150/88 | HR 75 | Ht 69.0 in | Wt 158.5 lb

## 2015-11-27 DIAGNOSIS — I679 Cerebrovascular disease, unspecified: Secondary | ICD-10-CM

## 2015-11-27 DIAGNOSIS — I10 Essential (primary) hypertension: Secondary | ICD-10-CM | POA: Diagnosis not present

## 2015-11-27 DIAGNOSIS — I2510 Atherosclerotic heart disease of native coronary artery without angina pectoris: Secondary | ICD-10-CM | POA: Diagnosis not present

## 2015-11-27 LAB — LIPID PANEL
CHOL/HDL RATIO: 3.4 ratio (ref <=5.0)
Cholesterol: 131 mg/dL (ref 125–200)
HDL: 39 mg/dL — ABNORMAL LOW (ref >=40)
LDL Cholesterol: 40 mg/dL (ref <130)
Triglycerides: 258 mg/dL — ABNORMAL HIGH (ref <150)
VLDL: 52 mg/dL — AB (ref <30)

## 2015-11-27 LAB — BASIC METABOLIC PANEL
BUN: 15 mg/dL (ref 7–25)
CO2: 34 mmol/L — AB (ref 20–31)
Calcium: 10.1 mg/dL (ref 8.6–10.3)
Chloride: 94 mmol/L — ABNORMAL LOW (ref 98–110)
Creat: 0.96 mg/dL (ref 0.70–1.18)
GLUCOSE: 98 mg/dL (ref 65–99)
POTASSIUM: 4.3 mmol/L (ref 3.5–5.3)
SODIUM: 137 mmol/L (ref 135–146)

## 2015-11-27 LAB — HEPATIC FUNCTION PANEL
ALBUMIN: 4.1 g/dL (ref 3.6–5.1)
ALT: 14 U/L (ref 9–46)
AST: 19 U/L (ref 10–35)
Alkaline Phosphatase: 59 U/L (ref 40–115)
BILIRUBIN DIRECT: 0.1 mg/dL (ref <=0.2)
BILIRUBIN TOTAL: 0.4 mg/dL (ref 0.2–1.2)
Indirect Bilirubin: 0.3 mg/dL (ref 0.2–1.2)
Total Protein: 6.9 g/dL (ref 6.1–8.1)

## 2015-11-27 MED ORDER — METOPROLOL SUCCINATE ER 100 MG PO TB24: ORAL_TABLET | ORAL | Status: DC

## 2015-11-27 NOTE — Assessment & Plan Note (Signed)
Continue aspirin and statin. 

## 2015-11-27 NOTE — Patient Instructions (Signed)
Medication Instructions:   INCREASE METOPROLOL TO 150 MG ONCE DAILY= 1 AND 1/2 OF 100 MG TABLET ONCE DAILY  Labwork:  Your physician recommends that you HAVE LAB WORK TODAY  Testing/Procedures:  Your physician has requested that you have a carotid duplex. This test is an ultrasound of the carotid arteries in your neck. It looks at blood flow through these arteries that supply the brain with blood. Allow one hour for this exam. There are no restrictions or special instructions.    Follow-Up:  Your physician wants you to follow-up in: ONE YEAR WITH DR Shelda Pal will receive a reminder letter in the mail two months in advance. If you don't receive a letter, please call our office to schedule the follow-up appointment.   If you need a refill on your cardiac medications before your next appointment, please call your pharmacy.

## 2015-11-27 NOTE — Assessment & Plan Note (Signed)
Continue statin. Check lipids and liver. 

## 2015-11-27 NOTE — Assessment & Plan Note (Signed)
Blood pressure elevated. Increase Toprol to 150 mg daily. Check potassium and renal function.

## 2015-11-27 NOTE — Assessment & Plan Note (Signed)
Continue aspirin and statin. Schedule followup carotid Dopplers. 

## 2015-11-29 ENCOUNTER — Encounter: Payer: Self-pay | Admitting: *Deleted

## 2015-12-06 ENCOUNTER — Ambulatory Visit (HOSPITAL_COMMUNITY)
Admission: RE | Admit: 2015-12-06 | Discharge: 2015-12-06 | Disposition: A | Discharge status: Home / Self Care | Payer: Medicare Other | Source: Ambulatory Visit | Attending: Internal Medicine | Admitting: Internal Medicine

## 2015-12-06 DIAGNOSIS — E785 Hyperlipidemia, unspecified: Secondary | ICD-10-CM | POA: Diagnosis not present

## 2015-12-06 DIAGNOSIS — I10 Essential (primary) hypertension: Secondary | ICD-10-CM | POA: Diagnosis not present

## 2015-12-06 DIAGNOSIS — I6523 Occlusion and stenosis of bilateral carotid arteries: Secondary | ICD-10-CM | POA: Diagnosis not present

## 2015-12-06 DIAGNOSIS — I679 Cerebrovascular disease, unspecified: Secondary | ICD-10-CM | POA: Insufficient documentation

## 2015-12-07 ENCOUNTER — Telehealth: Payer: Self-pay | Admitting: Cardiology

## 2015-12-07 NOTE — Telephone Encounter (Signed)
New message ° ° ° ° ° °Returning a call to the nurse to get test results °

## 2015-12-07 NOTE — Telephone Encounter (Signed)
Returned pt call, results given, pt verbalized understanding of recommendations.

## 2016-01-05 ENCOUNTER — Other Ambulatory Visit: Payer: Self-pay | Admitting: Cardiology

## 2016-01-05 MED ORDER — CRESTOR 40 MG PO TABS: 40.0000 mg | ORAL_TABLET | Freq: Every day | ORAL | Status: DC

## 2016-01-09 ENCOUNTER — Other Ambulatory Visit: Payer: Self-pay | Admitting: *Deleted

## 2016-01-09 MED ORDER — ROSUVASTATIN CALCIUM 40 MG PO TABS: 40.0000 mg | ORAL_TABLET | Freq: Every day | ORAL | Status: DC

## 2016-01-22 ENCOUNTER — Other Ambulatory Visit: Payer: Self-pay | Admitting: Cardiology

## 2016-04-18 ENCOUNTER — Other Ambulatory Visit: Payer: Self-pay | Admitting: Cardiology

## 2016-04-18 NOTE — Telephone Encounter (Signed)
Rx(s) sent to pharmacy electronically.  

## 2016-05-10 ENCOUNTER — Other Ambulatory Visit: Payer: Self-pay | Admitting: Cardiology

## 2016-06-21 ENCOUNTER — Other Ambulatory Visit: Payer: Self-pay

## 2016-09-04 ENCOUNTER — Other Ambulatory Visit (INDEPENDENT_AMBULATORY_CARE_PROVIDER_SITE_OTHER): Payer: Medicare Other

## 2016-09-04 ENCOUNTER — Encounter: Payer: Self-pay | Admitting: Internal Medicine

## 2016-09-04 ENCOUNTER — Ambulatory Visit (INDEPENDENT_AMBULATORY_CARE_PROVIDER_SITE_OTHER): Payer: Medicare Other | Admitting: Internal Medicine

## 2016-09-04 VITALS — BP 116/66 | HR 80 | Temp 98.1°F | Resp 20 | Ht 69.0 in | Wt 156.0 lb

## 2016-09-04 DIAGNOSIS — E785 Hyperlipidemia, unspecified: Secondary | ICD-10-CM

## 2016-09-04 DIAGNOSIS — E781 Pure hyperglyceridemia: Secondary | ICD-10-CM | POA: Diagnosis not present

## 2016-09-04 DIAGNOSIS — I10 Essential (primary) hypertension: Secondary | ICD-10-CM

## 2016-09-04 DIAGNOSIS — K4030 Unilateral inguinal hernia, with obstruction, without gangrene, not specified as recurrent: Secondary | ICD-10-CM

## 2016-09-04 DIAGNOSIS — I2510 Atherosclerotic heart disease of native coronary artery without angina pectoris: Secondary | ICD-10-CM | POA: Diagnosis not present

## 2016-09-04 DIAGNOSIS — J4540 Moderate persistent asthma, uncomplicated: Secondary | ICD-10-CM

## 2016-09-04 LAB — LIPID PANEL
CHOL/HDL RATIO: 3
CHOLESTEROL: 145 mg/dL (ref 0–200)
HDL: 45.9 mg/dL (ref >39.00)
LDL CALC: 62 mg/dL (ref 0–99)
NonHDL: 98.92
TRIGLYCERIDES: 186 mg/dL — AB (ref 0.0–149.0)
VLDL: 37.2 mg/dL (ref 0.0–40.0)

## 2016-09-04 LAB — COMPREHENSIVE METABOLIC PANEL
ALBUMIN: 4.4 g/dL (ref 3.5–5.2)
ALT: 14 U/L (ref 0–53)
AST: 17 U/L (ref 0–37)
Alkaline Phosphatase: 58 U/L (ref 39–117)
BUN: 15 mg/dL (ref 6–23)
CHLORIDE: 94 meq/L — AB (ref 96–112)
CO2: 37 meq/L — AB (ref 19–32)
CREATININE: 1.08 mg/dL (ref 0.40–1.50)
Calcium: 10.2 mg/dL (ref 8.4–10.5)
GFR: 70.47 mL/min (ref >60.00)
Glucose, Bld: 89 mg/dL (ref 70–99)
POTASSIUM: 3.6 meq/L (ref 3.5–5.1)
SODIUM: 136 meq/L (ref 135–145)
Total Bilirubin: 0.4 mg/dL (ref 0.2–1.2)
Total Protein: 7.8 g/dL (ref 6.0–8.3)

## 2016-09-04 LAB — TSH: TSH: 0.86 u[IU]/mL (ref 0.35–4.50)

## 2016-09-04 MED ORDER — FLUTICASONE FUROATE-VILANTEROL 200-25 MCG/INH IN AEPB: 1.0000 | INHALATION_SPRAY | Freq: Every day | RESPIRATORY_TRACT | 3 refills | Status: DC

## 2016-09-04 NOTE — Progress Notes (Signed)
Pre visit review using our clinic review tool, if applicable. No additional management support is needed unless otherwise documented below in the visit note. 

## 2016-09-04 NOTE — Progress Notes (Signed)
Subjective:  Patient ID: [name], male    DOB: April 24, 1941  Age: 75 y.o. MRN: [id number]  CC: Hypertension; Hyperlipidemia; and Asthma  NEW TO ME  HPI [name] presents for concerns about a bulge in his right groin. He has noticed that there for several weeks. He denies abdominal pain, nausea, vomiting, dysuria, or hematuria.  He has a history of asthma and a remote history of tobacco abuse but is elected not to treat it. He complains of chronic wheezing and shortness of breath. He denies any recent cough, hemoptysis, weight loss, fever, chills, or night sweats.  He has a history of high blood pressure which is well-controlled the combination of Hydrocort Dyazide and lisinopril. He has had no recent episodes of headache/blurred vision/DOE/edema.  Outpatient Medications Prior to Visit  Medication Sig Dispense Refill  . aspirin 81 MG tablet Take 81 mg by mouth daily.      . hydrochlorothiazide (MICROZIDE) 12.5 MG capsule TAKE 1 CAPSULE(12.5 MG) BY MOUTH DAILY 90 capsule 1  . lisinopril (PRINIVIL,ZESTRIL) 40 MG tablet TAKE 1 TABLET(40 MG) BY MOUTH DAILY 90 tablet 2  . metoprolol succinate (TOPROL-XL) 100 MG 24 hr tablet TAKE 1 AND 1/2 TABLET BY MOUTH EVERY DAY IMMEDIATELY FOLLOWING A MEAL 105 tablet 3  . Multiple Vitamin (MULTIVITAMIN) tablet Take 1 tablet by mouth daily.    . Omega-3 Fatty Acids (FISH OIL) 1000 MG CAPS Take 3 capsules by mouth daily.    . rosuvastatin (CRESTOR) 40 MG tablet Take 1 tablet (40 mg total) by mouth daily. 30 tablet 10   No facility-administered medications prior to visit.     ROS Review of Systems  Constitutional: Negative for appetite change, chills, diaphoresis, fatigue and fever.  HENT: Negative.   Eyes: Negative.   Respiratory: Positive for shortness of breath and wheezing. Negative for cough, choking and chest tightness.   Cardiovascular: Negative for chest pain, palpitations and leg swelling.  Gastrointestinal: Negative for abdominal  pain, constipation, diarrhea, nausea and vomiting.  Endocrine: Negative.   Genitourinary: Positive for scrotal swelling. Negative for difficulty urinating, discharge, dysuria, frequency, testicular pain and urgency.  Musculoskeletal: Negative.   Skin: Negative.   Allergic/Immunologic: Negative.   Neurological: Negative.   Hematological: Negative.  Negative for adenopathy. Does not bruise/bleed easily.  Psychiatric/Behavioral: Negative.     Objective:  BP 116/66 (BP Location: Left Arm, Patient Position: Sitting, Cuff Size: Large)   Pulse 80   Temp 98.1 F (36.7 C) (Oral)   Resp 20   Ht 5\' 9"  (1.753 m)   Wt 156 lb (70.8 kg)   SpO2 92%   BMI 23.04 kg/m   BP Readings from Last 3 Encounters:  09/04/16 116/66  11/27/15 (!) 150/88  11/21/14 (!) 160/82    Wt Readings from Last 3 Encounters:  09/04/16 156 lb (70.8 kg)  11/27/15 158 lb 8 oz (71.9 kg)  11/21/14 169 lb 9.6 oz (76.9 kg)    Physical Exam  Constitutional: He is oriented to person, place, and time.  Non-toxic appearance. He does not have a sickly appearance. He does not appear ill. No distress.  HENT:  Mouth/Throat: Oropharynx is clear and moist. No oropharyngeal exudate.  Eyes: Conjunctivae are normal. Right eye exhibits no discharge. Left eye exhibits no discharge. No scleral icterus.  Neck: Normal range of motion. Neck supple. No JVD present. No tracheal deviation present. No thyromegaly present.  Cardiovascular: Normal rate, regular rhythm, normal heart sounds and intact distal pulses.  Exam reveals no  gallop and no friction rub.   No murmur heard. Pulmonary/Chest: Effort normal. No accessory muscle usage or stridor. No tachypnea. No respiratory distress. He has no decreased breath sounds. He has wheezes in the right middle field, the right lower field, the left middle field and the left lower field. He has rhonchi in the right lower field and the left lower field. He has no rales. He exhibits no tenderness.    Abdominal: Soft. Bowel sounds are normal. He exhibits no distension and no mass. There is no tenderness. There is no rebound and no guarding. A hernia is present. Hernia confirmed positive in the right inguinal area. Hernia confirmed negative in the left inguinal area.  Genitourinary: Penis normal. Right testis shows no mass, no swelling and no tenderness. Right testis is descended. Left testis shows no mass, no swelling and no tenderness. Left testis is descended. Uncircumcised. No phimosis, paraphimosis, hypospadias, penile erythema or penile tenderness. No discharge found.  Genitourinary Comments: There is a moderate sized, non-reducible, direct right inguinal hernia that extends into the scrotal sac  Musculoskeletal: Normal range of motion. He exhibits no edema, tenderness or deformity.  Lymphadenopathy:    He has no cervical adenopathy.       Right: No inguinal adenopathy present.       Left: No inguinal adenopathy present.  Neurological: He is oriented to person, place, and time.  Skin: Skin is warm and dry. No rash noted. He is not diaphoretic. No erythema. No pallor.  Psychiatric: He has a normal mood and affect. His behavior is normal. Judgment and thought content normal.  Vitals reviewed.   Lab Results  Component Value Date   GLUCOSE 89 09/04/2016   CHOL 145 09/04/2016   TRIG 186.0 (H) 09/04/2016   HDL 45.90 09/04/2016   LDLDIRECT 77.7 04/12/2008   LDLCALC 62 09/04/2016   ALT 14 09/04/2016   AST 17 09/04/2016   NA 136 09/04/2016   K 3.6 09/04/2016   CL 94 (L) 09/04/2016   CREATININE 1.08 09/04/2016   BUN 15 09/04/2016   CO2 37 (H) 09/04/2016   TSH 0.86 09/04/2016    No results found.  Assessment & Plan:   [name] was seen today for hypertension, hyperlipidemia and asthma.  Diagnoses and all orders for this visit:  Atherosclerosis of native coronary artery of native heart without angina pectoris- He has had no recent episodes of angina, will continue risk factor  modification with blood pressure control, and statin therapy. -     Lipid panel; Future  Essential hypertension, benign- his blood pressure is well-controlled, lites and renal function are stable. -     Comprehensive metabolic panel; Future  Hyperlipidemia LDL goal <70- he has achieved his LDL goal and is doing well on the statin. -     Lipid panel; Future -     TSH; Future  Pure hyperglyceridemia- improvement noted -     Lipid panel; Future  Non-recurrent inguinal hernia with obstruction without gangrene, unspecified laterality -     Ambulatory referral to General Surgery  Moderate persistent asthma without complication- I gave him a sample of BREO and showed him how to use it, he demonstrated proficiency with its use. His lab work shows that he has a compensated respiratory acidosis. He may need to have surgery soon so I've asked him to see pulmonary for further evaluation of his respiratory status to see if he would be cleared for surgery. -     fluticasone furoate-vilanterol (BREO ELLIPTA) 200-25  MCG/INH AEPB; Inhale 1 puff into the lungs daily. -     Ambulatory referral to Pulmonology   I am having Mr. Helvey start on fluticasone furoate-vilanterol. I am also having him maintain his aspirin, Fish Oil, multivitamin, metoprolol succinate, rosuvastatin, lisinopril, and hydrochlorothiazide.  Meds ordered this encounter  Medications  . fluticasone furoate-vilanterol (BREO ELLIPTA) 200-25 MCG/INH AEPB    Sig: Inhale 1 puff into the lungs daily.    Dispense:  90 each    Refill:  3     Follow-up: Return in about 4 weeks (around 10/02/2016).  Sanda Lingerhomas Starr Urias, MD

## 2016-09-04 NOTE — Patient Instructions (Signed)
Asthma, Adult Asthma is a recurring condition in which the airways tighten and narrow. Asthma can make it difficult to breathe. It can cause coughing, wheezing, and shortness of breath. Asthma episodes, also called asthma attacks, range from minor to life-threatening. Asthma cannot be cured, but medicines and lifestyle changes can help control it. CAUSES Asthma is believed to be caused by inherited (genetic) and environmental factors, but its exact cause is unknown. Asthma may be triggered by allergens, lung infections, or irritants in the air. Asthma triggers are different for each person. Common triggers include:   Animal dander.  Dust mites.  Cockroaches.  Pollen from trees or grass.  Mold.  Smoke.  Air pollutants such as dust, household cleaners, hair sprays, aerosol sprays, paint fumes, strong chemicals, or strong odors.  Cold air, weather changes, and winds (which increase molds and pollens in the air).  Strong emotional expressions such as crying or laughing hard.  Stress.  Certain medicines (such as aspirin) or types of drugs (such as beta-blockers).  Sulfites in foods and drinks. Foods and drinks that may contain sulfites include dried fruit, potato chips, and sparkling grape juice.  Infections or inflammatory conditions such as the flu, a cold, or an inflammation of the nasal membranes (rhinitis).  Gastroesophageal reflux disease (GERD).  Exercise or strenuous activity. SYMPTOMS Symptoms may occur immediately after asthma is triggered or many hours later. Symptoms include:  Wheezing.  Excessive nighttime or early morning coughing.  Frequent or severe coughing with a common cold.  Chest tightness.  Shortness of breath. DIAGNOSIS  The diagnosis of asthma is made by a review of your medical history and a physical exam. Tests may also be performed. These may include:  Lung function studies. These tests show how much air you breathe in and out.  Allergy  tests.  Imaging tests such as X-rays. TREATMENT  Asthma cannot be cured, but it can usually be controlled. Treatment involves identifying and avoiding your asthma triggers. It also involves medicines. There are 2 classes of medicine used for asthma treatment:   Controller medicines. These prevent asthma symptoms from occurring. They are usually taken every day.  Reliever or rescue medicines. These quickly relieve asthma symptoms. They are used as needed and provide short-term relief. Your health care provider will help you create an asthma action plan. An asthma action plan is a written plan for managing and treating your asthma attacks. It includes a list of your asthma triggers and how they may be avoided. It also includes information on when medicines should be taken and when their dosage should be changed. An action plan may also involve the use of a device called a peak flow meter. A peak flow meter measures how well the lungs are working. It helps you monitor your condition. HOME CARE INSTRUCTIONS   Take medicines only as directed by your health care provider. Speak with your health care provider if you have questions about how or when to take the medicines.  Use a peak flow meter as directed by your health care provider. Record and keep track of readings.  Understand and use the action plan to help minimize or stop an asthma attack without needing to seek medical care.  Control your home environment in the following ways to help prevent asthma attacks:  Do not smoke. Avoid being exposed to secondhand smoke.  Change your heating and air conditioning filter regularly.  Limit your use of fireplaces and wood stoves.  Get rid of pests (such as roaches   and mice) and their droppings.  Throw away plants if you see mold on them.  Clean your floors and dust regularly. Use unscented cleaning products.  Try to have someone else vacuum for you regularly. Stay out of rooms while they are  being vacuumed and for a short while afterward. If you vacuum, use a dust mask from a hardware store, a double-layered or microfilter vacuum cleaner bag, or a vacuum cleaner with a HEPA filter.  Replace carpet with wood, tile, or vinyl flooring. Carpet can trap dander and dust.  Use allergy-proof pillows, mattress covers, and box spring covers.  Wash bed sheets and blankets every week in hot water and dry them in a dryer.  Use blankets that are made of polyester or cotton.  Clean bathrooms and kitchens with bleach. If possible, have someone repaint the walls in these rooms with mold-resistant paint. Keep out of the rooms that are being cleaned and painted.  Wash hands frequently. SEEK MEDICAL CARE IF:   You have wheezing, shortness of breath, or a cough even if taking medicine to prevent attacks.  The colored mucus you cough up (sputum) is thicker than usual.  Your sputum changes from clear or white to yellow, green, gray, or bloody.  You have any problems that may be related to the medicines you are taking (such as a rash, itching, swelling, or trouble breathing).  You are using a reliever medicine more than 2-3 times per week.  Your peak flow is still at 50-79% of your personal best after following your action plan for 1 hour.  You have a fever. SEEK IMMEDIATE MEDICAL CARE IF:   You seem to be getting worse and are unresponsive to treatment during an asthma attack.  You are short of breath even at rest.  You get short of breath when doing very little physical activity.  You have difficulty eating, drinking, or talking due to asthma symptoms.  You develop chest pain.  You develop a fast heartbeat.  You have a bluish color to your lips or fingernails.  You are light-headed, dizzy, or faint.  Your peak flow is less than 50% of your personal best.   This information is not intended to replace advice given to you by your health care provider. Make sure you discuss any  questions you have with your health care provider.   Document Released: 10/14/2005 Document Revised: 07/05/2015 Document Reviewed: 05/13/2013 [employer] Interactive Patient Education 2016 [employer]  

## 2016-09-24 ENCOUNTER — Ambulatory Visit: Payer: Self-pay | Admitting: General Surgery

## 2016-09-24 DIAGNOSIS — K4090 Unilateral inguinal hernia, without obstruction or gangrene, not specified as recurrent: Secondary | ICD-10-CM | POA: Diagnosis not present

## 2016-09-24 NOTE — H&P (Signed)
[name] 09/24/2016 2:53 PM Location: [facility] Patient #: [id number] DOB: Mar 11, 1941 Married / Language: English / Race: White Male  History of Present Illness Devin Pollack MD; 09/24/2016 4:22 PM) The patient is a 75 year old male.   Note:He is referred by Dr. Sanda Linger for consultation regarding a large right inguinal hernia. He stated he noticed it recently. It goes down to the right scrotum. He says it doesn't cause him significant pain. He would like to have it repaired. Dr. Yetta Barre has referred him to Dr. Sherene Sires for preoperative pulmonary evaluation. He also sees Dr. Jens Som every January to follow up on his coronary artery disease. He denies any difficulty with urination. He denies constipation. He lives with his wife.  Other Problems (April Staton, CMA; 09/24/2016 2:53 PM) Asthma Heart murmur Hypercholesterolemia Myocardial infarction  Past Surgical History (April Staton, [place]; 09/24/2016 2:53 PM) Shoulder Surgery Right. Ventral / Umbilical Hernia Surgery Left.  Diagnostic Studies History (April Staton, [place]; 09/24/2016 2:53 PM) Colonoscopy >10 years ago  Allergies (April Staton, CMA; 09/24/2016 3:00 PM) Penicillins  Medication History (April Staton, CMA; 09/24/2016 3:01 PM) Earlie Server (200-25MCG/INH Aero Pow Br Act, Inhalation) Active. Crestor (40MG  Tablet, Oral) Active. HydroCHLOROthiazide (12.5MG  Capsule, Oral) Active. Lisinopril (40MG  Tablet, Oral) Active. Metoprolol Succinate ER (100MG  Tablet ER 24HR, Oral) Active. Aspirin (81MG  Tablet, Oral) Active. Multiple Vitamin (Oral) Active. Fish Oil (Oral) Specific dose unknown - Active. Medications Reconciled  Social History (April Staton, [place]; 09/24/2016 2:53 PM) Caffeine use Carbonated beverages, Coffee. No alcohol use No drug use Tobacco use Former smoker.  Family History (April Staton, [place]; 09/24/2016 2:53 PM) Diabetes Mellitus Mother.     Review of  Systems (April Staton CMA; 09/24/2016 2:53 PM) General Not Present- Appetite Loss, Chills, Fatigue, Fever, Night Sweats, Weight Gain and Weight Loss. Skin Not Present- Change in Wart/Mole, Dryness, Hives, Jaundice, New Lesions, Non-Healing Wounds, Rash and Ulcer. HEENT Not Present- Earache, Hearing Loss, Hoarseness, Nose Bleed, Oral Ulcers, Ringing in the Ears, Seasonal Allergies, Sinus Pain, Sore Throat, Visual Disturbances, Wears glasses/contact lenses and Yellow Eyes. Respiratory Present- Wheezing. Not Present- Bloody sputum, Chronic Cough, Difficulty Breathing and Snoring. Breast Not Present- Breast Mass, Breast Pain, Nipple Discharge and Skin Changes. Cardiovascular Not Present- Chest Pain, Difficulty Breathing Lying Down, Leg Cramps, Palpitations, Rapid Heart Rate, Shortness of Breath and Swelling of Extremities. Gastrointestinal Not Present- Abdominal Pain, Bloating, Bloody Stool, Change in Bowel Habits, Chronic diarrhea, Constipation, Difficulty Swallowing, Excessive gas, Gets full quickly at meals, Hemorrhoids, Indigestion, Nausea, Rectal Pain and Vomiting. Male Genitourinary Not Present- Blood in Urine, Change in Urinary Stream, Frequency, Impotence, Nocturia, Painful Urination, Urgency and Urine Leakage.  Vitals (April Staton CMA; 09/24/2016 3:02 PM) 09/24/2016 3:01 PM Weight: 153.25 lb Height: 69in Body Surface Area: 1.84 m Body Mass Index: 22.63 kg/m  Temp.: 98.45F(Oral)  Pulse: 92 (Regular)  BP: 118/62 (Sitting, Left Arm, Standard)      Physical Exam Devin Pollack MD; 09/24/2016 4:44 PM)  The physical exam findings are as follows: Note:General: Thin elderly male in NAD. Pleasant and cooperative.  HEENT: /AT, no external nasal or ear masses, mucous membranes are moist  CV: RRR, no edema  CHEST: Breath sounds equal and clear. Respirations nonlabored.  ABDOMEN: Soft, nontender, nondistended, no masses, epigastric scar without hernia  GU: Large  right inguinal bulge extending down to distal scrotum that is reducible in the supine position.  MUSCULOSKELETAL: FROM, good muscle tone, no edema, no venous stasis changes, normal station and gait  SKIN: No jaundice.  NEUROLOGIC: Alert and oriented, answers questions appropriately, normal gait and station.  PSYCHIATRIC: Normal mood, affect , and behavior.    Assessment & Plan [name](Edyn Popoca J. Rice Walsh MD; 09/24/2016 4:22 PM)  INGUINAL HERNIA OF RIGHT SIDE WITHOUT OBSTRUCTION OR GANGRENE (K40.90) Impression: This is a large hernia that extends down to the distal scrotum but is reducible in the supine position. He is not symptomatic from it but would like to have it repaired. He is due to go see Dr. Sherene SiresWert for pulmonary consultation October 07, 2016. He is also followed yearly for his heart disease by Dr. Jens Somrenshaw and is usually seen in January.  Plan: After he sees Dr. Sherene SiresWert, as long as his risk from a pulmonary standpoint is not considered to be significant, we can talk about scheduling open repair of right internal hernia with mesh. I will also contact Dr. Jens Somrenshaw to see if he needs to see him preop.  I have explained the procedure, risks, and aftercare of inguinal hernia repair. Risks include but are not limited to bleeding, infection, wound problems, anesthesia, recurrence, bladder or intestine injury, urinary retention, testicular dysfunction, chronic pain, mesh problems. He/she seems to understand and agrees with the plan.  Avel Peaceodd Gwenyth Dingee, M.D.

## 2016-09-25 ENCOUNTER — Telehealth: Payer: Self-pay | Admitting: *Deleted

## 2016-09-25 NOTE — Telephone Encounter (Signed)
Spoke with pt, Follow up scheduled with APP 

## 2016-09-25 NOTE — Telephone Encounter (Signed)
-----   Message from Lewayne Bunting, MD sent at 09/25/2016  7:35 AM EST ----- Will need ov for preop Olga Millers  ----- Message ----- From: Avel Peace, MD Sent: 09/24/2016   4:46 PM To: Lewayne Bunting, MD  [name],  I saw him today because of a large right inguinal hernia.  We discussed repair and he is interested in that.  He is seeing Francella Solian next month for some lung issues.  Would you need to see him preop?  I notice you have been seeing him annually in January.  Lavena Bullion

## 2016-10-07 ENCOUNTER — Encounter: Payer: Self-pay | Admitting: Internal Medicine

## 2016-10-07 ENCOUNTER — Ambulatory Visit (INDEPENDENT_AMBULATORY_CARE_PROVIDER_SITE_OTHER): Payer: Medicare Other | Admitting: Internal Medicine

## 2016-10-07 VITALS — BP 108/70 | HR 71 | Ht 69.0 in | Wt 156.4 lb

## 2016-10-07 DIAGNOSIS — J9612 Chronic respiratory failure with hypercapnia: Secondary | ICD-10-CM | POA: Insufficient documentation

## 2016-10-07 DIAGNOSIS — I10 Essential (primary) hypertension: Secondary | ICD-10-CM | POA: Diagnosis not present

## 2016-10-07 DIAGNOSIS — J449 Chronic obstructive pulmonary disease, unspecified: Secondary | ICD-10-CM | POA: Diagnosis not present

## 2016-10-07 DIAGNOSIS — J4540 Moderate persistent asthma, uncomplicated: Secondary | ICD-10-CM | POA: Diagnosis not present

## 2016-10-07 DIAGNOSIS — I2510 Atherosclerotic heart disease of native coronary artery without angina pectoris: Secondary | ICD-10-CM

## 2016-10-07 MED ORDER — TIOTROPIUM BROMIDE-OLODATEROL 2.5-2.5 MCG/ACT IN AERS: 2.0000 | INHALATION_SPRAY | Freq: Every day | RESPIRATORY_TRACT | 0 refills | Status: DC

## 2016-10-07 MED ORDER — BISOPROLOL FUMARATE 5 MG PO TABS: 5.0000 mg | ORAL_TABLET | Freq: Every day | ORAL | 2 refills | Status: DC

## 2016-10-07 MED ORDER — IRBESARTAN 300 MG PO TABS: 300.0000 mg | ORAL_TABLET | Freq: Every day | ORAL | 11 refills | Status: DC

## 2016-10-07 NOTE — Progress Notes (Signed)
Subjective:    Patient ID: Francis DowseJimmy W Mcauliffe, male    DOB: 12/02/1940,    MRN: [id number]  HPI  [age] yowm last smoked in his 6050-60s with background of asthma as child and noted to be wheezing on exam per Dr Yetta BarreJones 09/04/16 and started breo [facility] and referred to pulmonary clinic 10/07/2016 by Dr   Abbey Chattersosenbower for pulmonary clearance   10/07/2016 1st [facility] office visit/ Aubrina Nieman   Chief Complaint  Patient presents with  . Pulm Consult    For asthma. Was had asthma since childhood. Never had problems with asthma.   chronic x years doe = MMRC1 = can walk nl pace, flat grade, can't hurry or go uphills or steps s sob    Some am cough / congestion but excess/ purulent sputum or mucus plugs  Or h/o hemoptysis  No obvious other patterns in day to day or daytime variabilty or assoc   cp or chest tightness, subjective wheeze overt sinus or hb symptoms. No unusual exp hx or h/o childhood pna/ asthma or knowledge of premature birth.  Sleeping ok without nocturnal  or early am exacerbation  of respiratory  c/o's or need for noct saba. Also denies any obvious fluctuation of symptoms with weather or environmental changes or other aggravating or alleviating factors except as outlined above   Current Medications, Allergies, Complete Past Medical History, Past Surgical History, Family History, and Social History were reviewed in [facility] electronic medical record.             Review of Systems  Constitutional: Negative for fever and unexpected weight change.  HENT: Negative for congestion, dental problem, ear pain, nosebleeds, postnasal drip, rhinorrhea, sinus pressure, sneezing, sore throat and trouble swallowing.   Eyes: Negative for redness and itching.  Respiratory: Positive for cough. Negative for chest tightness, shortness of breath and wheezing.   Cardiovascular: Negative for palpitations and leg swelling.  Gastrointestinal: Negative for nausea and vomiting.    Genitourinary: Negative for dysuria.  Musculoskeletal: Negative for joint swelling.  Skin: Negative for rash.  Neurological: Negative for headaches.  Hematological: Does not bruise/bleed easily.  Psychiatric/Behavioral: Negative for dysphoric mood. The patient is not nervous/anxious.        Objective:   Physical Exam   amb wm nad very vigourous throat clearing / pseudowheeze   Wt Readings from Last 3 Encounters:  10/07/16 156 lb 6.4 oz (70.9 kg)  09/04/16 156 lb (70.8 kg)  11/27/15 158 lb 8 oz (71.9 kg)    Vital signs reviewed  - Note on arrival 02 sats  95% on RA     HEENT: nl   turbinates, and oropharynx. Nl external ear canals without cough reflex - edentulous with dentures in place    NECK :  without JVD/Nodes/TM/ nl carotid upstrokes bilaterally   LUNGS: no acc muscle use,  slt barrel  chest with distant bs but no true wheeze   CV:  RRR  no s3 or murmur or increase in P2, nad no edema   ABD:  soft and nontender with end insp hoover's in the supine position. No bruits or organomegaly appreciated, bowel sounds nl  MS:  Nl gait/ ext warm without deformities, calf tenderness, cyanosis or clubbing No obvious joint restrictions   SKIN: warm and dry without lesions    NEURO:  alert, approp, nl sensorium with  no motor or cerebellar deficits apparent.    CXR PA and Lateral:   10/07/2016 :  I personally reviewed images and agree with radiology impression as follows:    did not go for cxr as requested     Labs ordered/ reviewed:    Chemistry      Component Value Date/Time   NA 136 09/04/2016 1024   K 3.6 09/04/2016 1024   CL 94 (L) 09/04/2016 1024   CO2 37 (H) 09/04/2016 1024   BUN 15 09/04/2016 1024   CREATININE 1.08 09/04/2016 1024   CREATININE 0.96 11/27/2015 1118      Component Value Date/Time   CALCIUM 10.2 09/04/2016 1024   ALKPHOS 58 09/04/2016 1024   AST 17 09/04/2016 1024   ALT 14 09/04/2016 1024   BILITOT 0.4 09/04/2016 1024              Lab Results  Component Value Date   TSH 0.86 09/04/2016         CXR PA and Lateral:   10/07/2016 :    I personally reviewed images and agree with radiology impression as follows:         Assessment & Plan:

## 2016-10-07 NOTE — Patient Instructions (Addendum)
Stop BREO and lisinopril and lopressor  Start avapro (ibestartan) 300 mg daily and start bisoprolol 5 mg twice daily   Start Stiolto 2 pffs each am including  The day of surgery  Please remember to go to the x-ray department downstairs for your tests - we will call you with the results when they are available.     You are cleared for surgery subject to anesthesiology approval and obtaining cxr for my review

## 2016-10-07 NOTE — Assessment & Plan Note (Addendum)
Strongly prefer in this setting: Bystolic, the most beta -1  selective Beta blocker available in sample form, with bisoprolol the most selective generic choice  on the market.   Try bisoprolol 5 mg bid instead of high dose lopressor   In the best review of chronic cough to date ( NEJM 2016 375 0340-3524) ,  ACEi are now felt to cause cough in up to  20% of pts which is a 4 fold increase from previous reports and does not include the variety of non-specific complaints we see in pulmonary clinic in pts on ACEi but previously attributed to another dx like  Copd/asthma and  include PNDS, throat and chest congestion, "bronchitis", unexplained dyspnea and noct "strangling" sensations, and hoarseness, but also  atypical /refractory GERD symptoms like dysphagia and "bad heartburn"   The only way I know  to prove this is not an "ACEi Case" is a trial off ACEi x a minimum of 6 weeks then regroup.   Try avapro 300 mg daily instead of lisinopril / self monitoring planned

## 2016-10-07 NOTE — Assessment & Plan Note (Addendum)
Spirometry 10/07/2016  FEV1 1.33 (47%)  Ratio 50  On nothing pre - 10/07/2016  After extensive coaching HFA effectiveness =   75% > stiolto trial   Remarkably well compensated at baseline but needs to be in better shape for [facility] surgery   Discussed in detail all the  indications, usual  risks and alternatives  relative to the benefits with patient who agrees to proceed with surgery but first try stiolto and change to more specific BB short term trial   Needs pre op cxr which he did not do as rec today - will ask him to return   Total time devoted to counseling  > 50 % of 60 m office visit:  review case with pt/ discussion of options/alternatives/ personally creating written customized instructions  in presence of pt  then going over those specific  Instructions directly with the pt including how to use all of the meds but in particular covering each new medication in detail and the difference between the maintenance/automatic meds and the prns using an action plan format for the latter.  Please see AVS from this visit for a full list of these instructions

## 2016-10-07 NOTE — Assessment & Plan Note (Addendum)
Not per se a prohibition to surgery though need to be aware of it and not over ventilate during surgery or over react to hypercarbia post op as long as mental status and work of breathing acceptable.  Need to minimize/ max mobilization post op/ possible bipap bridge p extubation and PCCM f/u prn

## 2016-10-07 NOTE — Assessment & Plan Note (Addendum)
No evidence of asthma clinically but certainly could have component so needs for now much more specific BB (see separate a/p under hbp)

## 2016-10-08 ENCOUNTER — Encounter: Payer: Self-pay | Admitting: Internal Medicine

## 2016-10-08 ENCOUNTER — Telehealth: Payer: Self-pay | Admitting: *Deleted

## 2016-10-08 NOTE — Telephone Encounter (Signed)
LMTCB

## 2016-10-08 NOTE — Telephone Encounter (Signed)
-----   Message from Nyoka Cowden, MD sent at 10/08/2016  5:16 AM EST ----- Did not go for cxr, needs be completed to clear for surgery

## 2016-10-09 ENCOUNTER — Ambulatory Visit (INDEPENDENT_AMBULATORY_CARE_PROVIDER_SITE_OTHER): Payer: Medicare Other | Admitting: Nurse Practitioner

## 2016-10-09 ENCOUNTER — Encounter: Payer: Self-pay | Admitting: Nurse Practitioner

## 2016-10-09 VITALS — BP 112/65 | HR 89 | Ht 69.0 in | Wt 157.4 lb

## 2016-10-09 DIAGNOSIS — E782 Mixed hyperlipidemia: Secondary | ICD-10-CM

## 2016-10-09 DIAGNOSIS — K4090 Unilateral inguinal hernia, without obstruction or gangrene, not specified as recurrent: Secondary | ICD-10-CM | POA: Insufficient documentation

## 2016-10-09 DIAGNOSIS — E785 Hyperlipidemia, unspecified: Secondary | ICD-10-CM | POA: Insufficient documentation

## 2016-10-09 DIAGNOSIS — I2510 Atherosclerotic heart disease of native coronary artery without angina pectoris: Secondary | ICD-10-CM | POA: Diagnosis not present

## 2016-10-09 DIAGNOSIS — I739 Peripheral vascular disease, unspecified: Secondary | ICD-10-CM

## 2016-10-09 DIAGNOSIS — Z01810 Encounter for preprocedural cardiovascular examination: Secondary | ICD-10-CM | POA: Diagnosis not present

## 2016-10-09 DIAGNOSIS — I10 Essential (primary) hypertension: Secondary | ICD-10-CM | POA: Diagnosis not present

## 2016-10-09 DIAGNOSIS — I779 Disorder of arteries and arterioles, unspecified: Secondary | ICD-10-CM

## 2016-10-09 NOTE — Patient Instructions (Addendum)
Medication Instructions:  Continue current medications  Labwork: None Ordered  Testing/Procedures: Your physician has requested that you have a carotid duplex in February. This test is an ultrasound of the carotid arteries in your neck. It looks at blood flow through these arteries that supply the brain with blood. Allow one hour for this exam. There are no restrictions or special instructions.  Follow-Up: Your physician recommends that you schedule a follow-up appointment in: February 2018   Any Other Special Instructions Will Be Listed Below (If Applicable).   If you need a refill on your cardiac medications before your next appointment, please call your pharmacy.

## 2016-10-09 NOTE — Progress Notes (Signed)
Office Visit    Patient Name: [name] Date of Encounter: 10/09/2016  Primary Care Provider:  Sanda Linger, MD Primary Cardiologist:  B. Jens Som, MD   Chief Complaint    75 year old male with a prior history of CAD status post inferolateral MI in 2002 with RCA stenting, hypertension, hyperlipidemia, asthma, and carotid arterial disease who presents for follow-up and preoperative evaluation.  Past Medical History    Past Medical History:  Diagnosis Date  . Asthma    a. since childhood.  . Carotid arterial disease (HCC)    a. 11/2015 Carotid U/S: LICA 40-59%, RICA 100 CTO, nl subclavian arteries--f/u 1 yr.  . Coronary atherosclerosis of native coronary artery    a. 09/2001 inflat MI/PCI: RCA 67m (3.0x18 AVE S7 BMS); b. 07/2006 Cath: LM nl, LAD 50p, LCX min irregs, OM1 large, min irregs, OM2 min irregs, RCA 20 ISR;  c. 10/2012 MV: EF 74%, no ischemia.  . Essential hypertension   . Hyperlipidemia   . Right inguinal hernia    Past Surgical History:  Procedure Laterality Date  . HERNIA REPAIR     ventral    Allergies  Allergies  Allergen Reactions  . Penicillins Other (See Comments)    REACTION: whelps    History of Present Illness    75 year old male with a prior history of coronary artery disease status post inferolateral myocardial infarction in 2002 with bare metal stenting of the right coronary artery at that time. He also has a history of hypertension, hyperlipidemia, asthma, and carotid arterial disease with known chronic total occlusion of the right internal carotid artery. He was last seen in clinic in January 2017. Over the past year, he has continued to do well from a cardiac standpoint. He notes that he is very active and on the move from [place] to [facility]. He walks often and has not experienced any change in exercise tolerance, dyspnea, or chest discomfort.  He has been dealing with a right inguinal hernia and has been evaluated by surgery with plans first  hernia repair in the coming weeks. He presents today for preoperative evaluation. He was evaluated by pulmonology on December 11, in the setting of chronic asthma. He does have somewhat chronic wheeze though as above, he denies dyspnea on exertion. He was switched from metoprolol to bisoprolol and from lisinopril to Avapro. So far he is tolerating these.  Home Medications    Prior to Admission medications   Medication Sig Start Date End Date Taking? Authorizing Provider  aspirin 81 MG tablet Take 81 mg by mouth daily.     Yes Historical Provider, MD  bisoprolol (ZEBETA) 5 MG tablet Take 1 tablet (5 mg total) by mouth daily. 10/07/16  Yes Nyoka Cowden, MD  hydrochlorothiazide (MICROZIDE) 12.5 MG capsule TAKE 1 CAPSULE(12.5 MG) BY MOUTH DAILY 05/10/16  Yes Lewayne Bunting, MD  irbesartan (AVAPRO) 300 MG tablet Take 1 tablet (300 mg total) by mouth daily. 10/07/16  Yes Nyoka Cowden, MD  Multiple Vitamin (MULTIVITAMIN) tablet Take 1 tablet by mouth daily.   Yes Historical Provider, MD  Omega-3 Fatty Acids (FISH OIL) 1000 MG CAPS Take 3 capsules by mouth daily.   Yes Historical Provider, MD  rosuvastatin (CRESTOR) 40 MG tablet Take 1 tablet (40 mg total) by mouth daily. 01/09/16  Yes Lewayne Bunting, MD  Tiotropium Bromide-Olodaterol (STIOLTO RESPIMAT) 2.5-2.5 MCG/ACT AERS Inhale 2 puffs into the lungs daily. 10/07/16  Yes Nyoka Cowden, MD    Review  of Systems    As above, he has been doing well without chest pain or dyspnea. He does have somewhat chronic mild wheezing. He denies PND, orthopnea, dizziness, syncope, edema, palpitations, or early satiety.  All other systems reviewed and are otherwise negative except as noted above.  Physical Exam    VS:  BP 112/65   Pulse 89   Ht 5\' 9"  (1.753 m)   Wt 157 lb 6.4 oz (71.4 kg)   SpO2 92%   BMI 23.24 kg/m  , BMI Body mass index is 23.24 kg/m. GEN: Well nourished, well developed, in no acute distress.  HEENT: normal.  Neck: Supple, no JVD,  carotid bruits, or masses. Cardiac: RRR, no murmurs, rubs, or gallops. No clubbing, cyanosis, edema.  Radials/DP/PT 2+ and equal bilaterally.  Respiratory:  Respirations regular and unlabored, Faint inspiratory and extremity wheezing noted bilaterally.  GI: Soft, nontender, nondistended, BS + x 4. MS: no deformity or atrophy. Skin: warm and dry, no rash. Neuro:  Strength and sensation are intact. Psych: Normal affect.  Accessory Clinical Findings    ECG - Regular sinus rhythm, 83, left axis deviation, poor R-wave progression. No significant changes.  Assessment & Plan    1.  Coronary artery disease/preoperative cardiovascular examination (Right Inguinal Hernia): Status post prior inferolateral MI in 2002 with bare metal stenting of right coronary artery. He had a nonischemic stress test in 2014. He has been doing well over the past 12 months without any chest pain or dyspnea. He remains active without change in exercise tolerance. He is pending inguinal hernia repair on the right. As he is doing well from a cardiac standpoint, he will not require any additional ischemic evaluation. I do recommend that he continue beta blocker and statin therapy throughout the perioperative period with resumption of aspirin when deemed appropriate by the surgical team. He remains on aspirin, bisoprolol, ARB, and Crestor therapy.  2. Essential hypertension: Blood pressure is stable on a blocker, ARB, and HCTZ therapy.  3. Hyperlipidemia: LDL was 62 in November. LFTs were normal at that time. He remains on Crestor therapy.  4. Bilateral carotid arterial disease: Carotid ultrasound in February 2017 was stable with 40-59% left internal carotid arterial stenosis and a chronic total occlusion of the right internal carotid artery. He is due for follow-up in February and we will get this arranged. He remains on aspirin and statin therapy.  5. Asthma: Patient was evaluated by pulmonology. His beta blocker was changed  from a Lopressor to bisoprolol. ACE inhibitor was discontinued in favor of irbesartan. He is tolerating these well up to this point. Remains on inhaler therapy per pulmonology.  6. Disposition: Patient may proceed to surgery without further ischemic evaluation. He should remain on beta blocker and statin therapy throughout the perioperative period. We will arrange for follow-up carotid ultrasound and office follow-up with Dr. Jens Somrenshaw in February.   Nicolasa Duckinghristopher Berge, NP 10/09/2016, 3:48 PM

## 2016-10-11 ENCOUNTER — Ambulatory Visit: Payer: Self-pay | Admitting: General Surgery

## 2016-10-14 NOTE — Telephone Encounter (Signed)
Spoke with the pt and he is aware to come in to have cxr done

## 2016-10-15 ENCOUNTER — Ambulatory Visit (INDEPENDENT_AMBULATORY_CARE_PROVIDER_SITE_OTHER)
Admission: RE | Admit: 2016-10-15 | Discharge: 2016-10-15 | Disposition: A | Discharge status: Home / Self Care | Payer: Medicare Other | Source: Ambulatory Visit | Attending: Internal Medicine | Admitting: Internal Medicine

## 2016-10-15 DIAGNOSIS — J449 Chronic obstructive pulmonary disease, unspecified: Secondary | ICD-10-CM | POA: Diagnosis not present

## 2016-10-15 DIAGNOSIS — J984 Other disorders of lung: Secondary | ICD-10-CM | POA: Diagnosis not present

## 2016-10-16 ENCOUNTER — Encounter: Payer: Self-pay | Admitting: Cardiology

## 2016-10-16 NOTE — Telephone Encounter (Signed)
Returned call to patient.He stated he was returning someone's phone call.Stated he does not remember their name.Advised I will send message to Dr.Crenshaw's nurse.

## 2016-10-16 NOTE — Addendum Note (Signed)
Addended by: Lorain Childes A on: 10/16/2016 05:05 PM   Modules accepted: Orders

## 2016-10-16 NOTE — Telephone Encounter (Signed)
°  Follow Up   Pt states he is returning a call from earlier. Please call.

## 2016-10-17 NOTE — Telephone Encounter (Signed)
This encounter was created in error - please disregard.

## 2016-10-17 NOTE — Progress Notes (Signed)
Spoke with pt and notified of results per Dr. Wert. Pt verbalized understanding and denied any questions. 

## 2016-10-24 ENCOUNTER — Encounter (HOSPITAL_COMMUNITY)
Admission: RE | Admit: 2016-10-24 | Discharge: 2016-10-24 | Disposition: A | Discharge status: Home / Self Care | Payer: Medicare Other | Source: Ambulatory Visit | Attending: General Surgery | Admitting: General Surgery

## 2016-10-24 ENCOUNTER — Encounter (HOSPITAL_COMMUNITY): Payer: Self-pay

## 2016-10-24 DIAGNOSIS — Z01812 Encounter for preprocedural laboratory examination: Secondary | ICD-10-CM | POA: Diagnosis not present

## 2016-10-24 DIAGNOSIS — K4090 Unilateral inguinal hernia, without obstruction or gangrene, not specified as recurrent: Secondary | ICD-10-CM | POA: Insufficient documentation

## 2016-10-24 HISTORY — DX: Pneumonia, unspecified organism: J18.9

## 2016-10-24 HISTORY — DX: Acute myocardial infarction, unspecified: I21.9

## 2016-10-24 HISTORY — DX: Cardiac murmur, unspecified: R01.1

## 2016-10-24 LAB — CBC
HEMATOCRIT: 47.7 % (ref 39.0–52.0)
HEMOGLOBIN: 16.8 g/dL (ref 13.0–17.0)
MCH: 33 pg (ref 26.0–34.0)
MCHC: 35.2 g/dL (ref 30.0–36.0)
MCV: 93.7 fL (ref 78.0–100.0)
Platelets: 235 10*3/uL (ref 150–400)
RBC: 5.09 MIL/uL (ref 4.22–5.81)
RDW: 13.7 % (ref 11.5–15.5)
WBC: 10.8 10*3/uL — ABNORMAL HIGH (ref 4.0–10.5)

## 2016-10-24 LAB — BASIC METABOLIC PANEL
Anion gap: 11 (ref 5–15)
BUN: 15 mg/dL (ref 6–20)
CHLORIDE: 95 mmol/L — AB (ref 101–111)
CO2: 30 mmol/L (ref 22–32)
Calcium: 10.1 mg/dL (ref 8.9–10.3)
Creatinine, Ser: 1 mg/dL (ref 0.61–1.24)
GFR calc Af Amer: >60 mL/min (ref >60)
GFR calc non Af Amer: >60 mL/min (ref >60)
GLUCOSE: 97 mg/dL (ref 65–99)
POTASSIUM: 3.8 mmol/L (ref 3.5–5.1)
Sodium: 136 mmol/L (ref 135–145)

## 2016-10-24 NOTE — Pre-Procedure Instructions (Signed)
    DARICK SAFER  10/24/2016      Your procedure is scheduled on Friday, January 5.  Report to [facility] Admitting at 10:00 AM              Your surgery or procedure is scheduled for 12:00 noon   Call this number if you have problems the morning of surgery: [phone]                  For any other questions, please call [phone], Tuesday - Friday 8 AM - 4 PM.   Remember:  Do not eat food or drink liquids after midnight Thursday, January 4.  Take these medicines the morning of surgery with A SIP OF WATER: bisoprolol (ZEBETA).                   1 Week prior to surgery STOP taking Aspirin, Aspirin Products (Goody Powder, Excedrin Migraine), Ibuprofen (Advil), Naproxen (Aleve), Vitamins and Herbal Products (ie Fish Oil)   Do not wear jewelry, make-up or nail polish.  Do not wear lotions, powders, or perfumes, or deodorant.   Men may shave face and neck.  Do not bring valuables to the hospital.  [facility] is not responsible for any belongings or valuables.  Contacts, dentures or bridgework may not be worn into surgery.  Leave your suitcase in the car.  After surgery it may be brought to your room.  For patients admitted to the hospital, discharge time will be determined by your treatment team.  Patients discharged the day of surgery will not be allowed to drive home.   Name and phone number of your driver:  -  Special instructions: Review  [place] - Preparing For Surgery.  Please read over the following fact sheets that you were given: Review  [place] - Preparing For Surgery, Coughing and Deep Breathing and Pain Booklet

## 2016-10-27 ENCOUNTER — Other Ambulatory Visit: Payer: Self-pay | Admitting: Cardiology

## 2016-11-01 ENCOUNTER — Ambulatory Visit (HOSPITAL_COMMUNITY): Payer: Medicare Other | Admitting: Anesthesiology

## 2016-11-01 ENCOUNTER — Encounter (HOSPITAL_COMMUNITY)
Admission: RE | Disposition: A | Discharge status: Home / Self Care | Payer: Self-pay | Source: Ambulatory Visit | Attending: General Surgery

## 2016-11-01 ENCOUNTER — Encounter (HOSPITAL_COMMUNITY): Payer: Self-pay | Admitting: *Deleted

## 2016-11-01 ENCOUNTER — Ambulatory Visit (HOSPITAL_COMMUNITY)
Admission: RE | Admit: 2016-11-01 | Discharge: 2016-11-01 | Disposition: A | Discharge status: Home / Self Care | Payer: Medicare Other | Source: Ambulatory Visit | Attending: General Surgery | Admitting: General Surgery

## 2016-11-01 DIAGNOSIS — E785 Hyperlipidemia, unspecified: Secondary | ICD-10-CM | POA: Diagnosis not present

## 2016-11-01 DIAGNOSIS — Z951 Presence of aortocoronary bypass graft: Secondary | ICD-10-CM | POA: Insufficient documentation

## 2016-11-01 DIAGNOSIS — K4090 Unilateral inguinal hernia, without obstruction or gangrene, not specified as recurrent: Secondary | ICD-10-CM | POA: Insufficient documentation

## 2016-11-01 DIAGNOSIS — E7800 Pure hypercholesterolemia, unspecified: Secondary | ICD-10-CM | POA: Insufficient documentation

## 2016-11-01 DIAGNOSIS — Z87891 Personal history of nicotine dependence: Secondary | ICD-10-CM | POA: Insufficient documentation

## 2016-11-01 DIAGNOSIS — J449 Chronic obstructive pulmonary disease, unspecified: Secondary | ICD-10-CM | POA: Diagnosis not present

## 2016-11-01 DIAGNOSIS — Z880 Allergy status to penicillin: Secondary | ICD-10-CM | POA: Diagnosis not present

## 2016-11-01 DIAGNOSIS — I10 Essential (primary) hypertension: Secondary | ICD-10-CM | POA: Diagnosis not present

## 2016-11-01 DIAGNOSIS — I2510 Atherosclerotic heart disease of native coronary artery without angina pectoris: Secondary | ICD-10-CM | POA: Diagnosis not present

## 2016-11-01 DIAGNOSIS — G8918 Other acute postprocedural pain: Secondary | ICD-10-CM | POA: Diagnosis not present

## 2016-11-01 DIAGNOSIS — Z7982 Long term (current) use of aspirin: Secondary | ICD-10-CM | POA: Insufficient documentation

## 2016-11-01 HISTORY — PX: INGUINAL HERNIA REPAIR: SHX194

## 2016-11-01 HISTORY — PX: INSERTION OF MESH: SHX5868

## 2016-11-01 LAB — GLUCOSE, CAPILLARY: GLUCOSE-CAPILLARY: 109 mg/dL — AB (ref 65–99)

## 2016-11-01 SURGERY — REPAIR, HERNIA, INGUINAL, ADULT
Anesthesia: Regional | Site: Groin | Laterality: Right

## 2016-11-01 MED ORDER — ONDANSETRON HCL 4 MG/2ML IJ SOLN
INTRAMUSCULAR | Status: AC
  Filled 2016-11-01: qty 2

## 2016-11-01 MED ORDER — PROPOFOL 10 MG/ML IV BOLUS
INTRAVENOUS | Status: DC | PRN
  Administered 2016-11-01: 100 mg via INTRAVENOUS

## 2016-11-01 MED ORDER — PHENYLEPHRINE HCL 10 MG/ML IJ SOLN
INTRAVENOUS | Status: DC | PRN
  Administered 2016-11-01: 20 ug/min via INTRAVENOUS

## 2016-11-01 MED ORDER — ROPIVACAINE HCL 7.5 MG/ML IJ SOLN
INTRAMUSCULAR | Status: DC | PRN
  Administered 2016-11-01: 20 mL via PERINEURAL

## 2016-11-01 MED ORDER — HYDROCODONE-ACETAMINOPHEN 5-325 MG PO TABS: 1.0000 | ORAL_TABLET | ORAL | 0 refills | Status: DC | PRN

## 2016-11-01 MED ORDER — BUPIVACAINE HCL (PF) 0.5 % IJ SOLN
INTRAMUSCULAR | Status: AC
  Filled 2016-11-01: qty 30

## 2016-11-01 MED ORDER — HYDROMORPHONE HCL 1 MG/ML IJ SOLN: 0.2500 mg | INTRAMUSCULAR | Status: DC | PRN

## 2016-11-01 MED ORDER — PROPOFOL 10 MG/ML IV BOLUS
INTRAVENOUS | Status: AC
  Filled 2016-11-01: qty 20

## 2016-11-01 MED ORDER — BUPIVACAINE HCL (PF) 0.5 % IJ SOLN
INTRAMUSCULAR | Status: DC | PRN
  Administered 2016-11-01: 18 mL

## 2016-11-01 MED ORDER — BUPIVACAINE HCL (PF) 0.5 % IJ SOLN
INTRAMUSCULAR | Status: DC | PRN
  Administered 2016-11-01: 10 mL via PERINEURAL

## 2016-11-01 MED ORDER — OXYCODONE HCL 5 MG/5ML PO SOLN: 5.0000 mg | Freq: Once | ORAL | Status: DC | PRN

## 2016-11-01 MED ORDER — FENTANYL CITRATE (PF) 100 MCG/2ML IJ SOLN
INTRAMUSCULAR | Status: AC
  Filled 2016-11-01: qty 2

## 2016-11-01 MED ORDER — ROCURONIUM BROMIDE 50 MG/5ML IV SOSY
PREFILLED_SYRINGE | INTRAVENOUS | Status: AC
  Filled 2016-11-01: qty 5

## 2016-11-01 MED ORDER — ROCURONIUM BROMIDE 100 MG/10ML IV SOLN
INTRAVENOUS | Status: DC | PRN
  Administered 2016-11-01: 50 mg via INTRAVENOUS

## 2016-11-01 MED ORDER — FENTANYL CITRATE (PF) 100 MCG/2ML IJ SOLN
INTRAMUSCULAR | Status: DC | PRN
  Administered 2016-11-01: 100 ug via INTRAVENOUS

## 2016-11-01 MED ORDER — LIDOCAINE 2% (20 MG/ML) 5 ML SYRINGE
INTRAMUSCULAR | Status: AC
  Filled 2016-11-01: qty 10

## 2016-11-01 MED ORDER — LIDOCAINE HCL (CARDIAC) 20 MG/ML IV SOLN
INTRAVENOUS | Status: DC | PRN
  Administered 2016-11-01: 50 mg via INTRATRACHEAL

## 2016-11-01 MED ORDER — SUGAMMADEX SODIUM 200 MG/2ML IV SOLN
INTRAVENOUS | Status: AC
  Filled 2016-11-01: qty 2

## 2016-11-01 MED ORDER — MIDAZOLAM HCL 2 MG/2ML IJ SOLN
INTRAMUSCULAR | Status: AC
  Filled 2016-11-01: qty 2

## 2016-11-01 MED ORDER — CHLORHEXIDINE GLUCONATE CLOTH 2 % EX PADS: 6.0000 | MEDICATED_PAD | Freq: Once | CUTANEOUS | Status: DC

## 2016-11-01 MED ORDER — LACTATED RINGERS IV SOLN
INTRAVENOUS | Status: DC
  Administered 2016-11-01: 11:00:00 via INTRAVENOUS

## 2016-11-01 MED ORDER — VANCOMYCIN HCL IN DEXTROSE 1-5 GM/200ML-% IV SOLN
1000.0000 mg | INTRAVENOUS | Status: AC
  Administered 2016-11-01: 1000 mg via INTRAVENOUS
  Filled 2016-11-01: qty 200

## 2016-11-01 MED ORDER — OXYCODONE HCL 5 MG PO TABS: 5.0000 mg | ORAL_TABLET | Freq: Once | ORAL | Status: DC | PRN

## 2016-11-01 MED ORDER — 0.9 % SODIUM CHLORIDE (POUR BTL) OPTIME
TOPICAL | Status: DC | PRN
  Administered 2016-11-01: 1000 mL

## 2016-11-01 SURGICAL SUPPLY — 47 items
APL SKNCLS STERI-STRIP NONHPOA (GAUZE/BANDAGES/DRESSINGS) ×1
BENZOIN TINCTURE PRP APPL 2/3 (GAUZE/BANDAGES/DRESSINGS) ×3 IMPLANT
BLADE SURG 10 STRL SS (BLADE) ×3 IMPLANT
BLADE SURG 15 STRL LF DISP TIS (BLADE) ×1 IMPLANT
BLADE SURG 15 STRL SS (BLADE) ×3
BLADE SURG ROTATE 9660 (MISCELLANEOUS) IMPLANT
CHLORAPREP W/TINT 26ML (MISCELLANEOUS) ×3 IMPLANT
CLOSURE WOUND 1/2 X4 (GAUZE/BANDAGES/DRESSINGS) ×1
COVER SURGICAL LIGHT HANDLE (MISCELLANEOUS) ×3 IMPLANT
DRAIN PENROSE 1/2X12 LTX STRL (WOUND CARE) ×2 IMPLANT
DRAPE INCISE IOBAN 66X45 STRL (DRAPES) ×3 IMPLANT
DRAPE LAPAROTOMY TRNSV 102X78 (DRAPE) ×3 IMPLANT
DRAPE UTILITY XL STRL (DRAPES) ×6 IMPLANT
DRSG TEGADERM 4X4.75 (GAUZE/BANDAGES/DRESSINGS) ×3 IMPLANT
DRSG TELFA 3X8 NADH (GAUZE/BANDAGES/DRESSINGS) ×3 IMPLANT
ELECT CAUTERY BLADE 6.4 (BLADE) ×3 IMPLANT
ELECT REM PT RETURN 9FT ADLT (ELECTROSURGICAL) ×3
ELECTRODE REM PT RTRN 9FT ADLT (ELECTROSURGICAL) ×1 IMPLANT
GAUZE SPONGE 4X4 16PLY XRAY LF (GAUZE/BANDAGES/DRESSINGS) ×3 IMPLANT
GLOVE BIOGEL PI IND STRL 8 (GLOVE) ×1 IMPLANT
GLOVE BIOGEL PI INDICATOR 8 (GLOVE) ×2
GLOVE ECLIPSE 8.0 STRL XLNG CF (GLOVE) ×3 IMPLANT
GOWN STRL REUS W/ TWL LRG LVL3 (GOWN DISPOSABLE) ×2 IMPLANT
GOWN STRL REUS W/TWL LRG LVL3 (GOWN DISPOSABLE) ×6
KIT BASIN OR (CUSTOM PROCEDURE TRAY) ×3 IMPLANT
KIT ROOM TURNOVER OR (KITS) ×3 IMPLANT
MESH HERNIA 3X6 (Mesh General) ×2 IMPLANT
NDL HYPO 25GX1X1/2 BEV (NEEDLE) ×1 IMPLANT
NEEDLE HYPO 25GX1X1/2 BEV (NEEDLE) ×3 IMPLANT
NS IRRIG 1000ML POUR BTL (IV SOLUTION) ×3 IMPLANT
PACK SURGICAL SETUP 50X90 (CUSTOM PROCEDURE TRAY) ×3 IMPLANT
PAD ARMBOARD 7.5X6 YLW CONV (MISCELLANEOUS) ×3 IMPLANT
PAD DRESSING TELFA 3X8 NADH (GAUZE/BANDAGES/DRESSINGS) ×1 IMPLANT
PENCIL BUTTON HOLSTER BLD 10FT (ELECTRODE) ×3 IMPLANT
SPECIMEN JAR SMALL (MISCELLANEOUS) IMPLANT
SPONGE LAP 18X18 X RAY DECT (DISPOSABLE) ×3 IMPLANT
STRIP CLOSURE SKIN 1/2X4 (GAUZE/BANDAGES/DRESSINGS) ×2 IMPLANT
SUT MON AB 4-0 PC3 18 (SUTURE) ×3 IMPLANT
SUT PROLENE 2 0 CT2 30 (SUTURE) ×6 IMPLANT
SUT SILK 2 0 SH (SUTURE) IMPLANT
SUT VIC AB 2-0 SH 18 (SUTURE) ×3 IMPLANT
SUT VIC AB 3-0 SH 27 (SUTURE) ×6
SUT VIC AB 3-0 SH 27XBRD (SUTURE) ×1 IMPLANT
SUT VICRYL AB 3 0 TIES (SUTURE) ×3 IMPLANT
SYR CONTROL 10ML LL (SYRINGE) ×3 IMPLANT
TOWEL OR 17X24 6PK STRL BLUE (TOWEL DISPOSABLE) ×3 IMPLANT
TOWEL OR 17X26 10 PK STRL BLUE (TOWEL DISPOSABLE) ×3 IMPLANT

## 2016-11-01 NOTE — H&P (Signed)
[name] DOB: Mar 02, 1941 Married / Language: [place] / Race: White Male  History of Present Illness [name] MD; 09/24/2016 4:22 PM) The patient is a 76 year old male.   Note:He was referred by Dr. Sanda Linger for consultation regarding a large right inguinal hernia. He stated he noticed it recently. It goes down to the right scrotum. He says it doesn't cause him significant pain. He would like to have it repaired. He had a preop Pulmonary and Cardiology evaluation and is ready to proceed with hernia repair.  His family is in the holding room with him.  PMH: Asthma Heart murmur Hypercholesterolemia Myocardial infarction  Past Surgical History  Shoulder Surgery Right. Ventral / Umbilical Hernia Surgery Left.   Allergies (April Staton, CMA; 09/24/2016 3:00 PM) Penicillins  Prior to Admission medications   Medication Sig Start Date End Date Taking? Authorizing Provider  aspirin 81 MG tablet Take 81 mg by mouth daily.     Yes Historical Provider, MD  bisoprolol (ZEBETA) 5 MG tablet Take 1 tablet (5 mg total) by mouth daily. 10/07/16  Yes Nyoka Cowden, MD  hydrochlorothiazide (MICROZIDE) 12.5 MG capsule TAKE 1 CAPSULE(12.5 MG) BY MOUTH DAILY 05/10/16  Yes Lewayne Bunting, MD  irbesartan (AVAPRO) 300 MG tablet Take 1 tablet (300 mg total) by mouth daily. 10/07/16  Yes Nyoka Cowden, MD  lisinopril (PRINIVIL,ZESTRIL) 40 MG tablet Take 40 mg by mouth daily. 10/12/16  Yes Historical Provider, MD  Multiple Vitamin (MULTIVITAMIN) tablet Take 1 tablet by mouth daily.   Yes Historical Provider, MD  Omega-3 Fatty Acids (FISH OIL) 1000 MG CAPS Take 3,000 mg by mouth daily.    Yes Historical Provider, MD  rosuvastatin (CRESTOR) 40 MG tablet Take 1 tablet (40 mg total) by mouth daily. Patient taking differently: Take 40 mg by mouth every evening.  01/09/16  Yes Lewayne Bunting, MD  Tiotropium Bromide-Olodaterol (STIOLTO RESPIMAT) 2.5-2.5 MCG/ACT AERS Inhale  2 puffs into the lungs daily. 10/07/16  Yes Nyoka Cowden, MD    Social History  Caffeine use Carbonated beverages, Coffee. No alcohol use No drug use Tobacco use Former smoker.    Review of Systems:  No fever, chills, nausea, vomiting or diarrhea.   Physical Exam   The physical exam findings are as follows: Note:General: Thin elderly male in NAD. Pleasant and cooperative.  HEENT: [place]/AT, no external nasal or ear masses, mucous membranes are moist  CV: RRR, no edema  CHEST: Breath sounds equal and clear. Respirations nonlabored.  ABDOMEN: Soft, nontender, nondistended, no masses, epigastric scar without hernia  GU: Large right inguinal bulge extending down to distal scrotum that is reducible in the supine position.  MUSCULOSKELETAL: FROM, good muscle tone, no edema, no venous stasis changes, normal station and gait  SKIN: No jaundice.  NEUROLOGIC: Alert and oriented, answers questions appropriately, normal gait and station.  PSYCHIATRIC: Normal mood, affect , and behavior.    Assessment & Plan   INGUINAL HERNIA OF RIGHT SIDE WITHOUT OBSTRUCTION OR GANGRENE (K40.90) Impression: This is a large hernia that extends down to the distal scrotum but is reducible in the supine position. He is not symptomatic from it but would like to have it repaired.   Plan:  Open right inguinal hernia repair with mesh. I have explained the procedure, risks, and aftercare of inguinal hernia repair. Risks include but are not limited to bleeding, infection, wound problems, anesthesia, recurrence, bladder or intestine injury, urinary retention, testicular dysfunction, chronic pain, mesh problems.  Avel Peace, M.D.

## 2016-11-01 NOTE — Anesthesia Procedure Notes (Signed)
Procedure Name: Intubation Date/Time: 11/01/2016 12:32 PM Performed by: Marena Chancy Pre-anesthesia Checklist: Patient identified, Emergency Drugs available, Suction available and Patient being monitored Patient Re-evaluated:Patient Re-evaluated prior to inductionOxygen Delivery Method: Circle System Utilized Preoxygenation: Pre-oxygenation with 100% oxygen Intubation Type: IV induction Ventilation: Mask ventilation without difficulty Laryngoscope Size: Miller and 2 Grade View: Grade I Tube type: Oral Tube size: 7.5 mm Number of attempts: 1 Airway Equipment and Method: Stylet and Oral airway Placement Confirmation: ETT inserted through vocal cords under direct vision,  positive ETCO2 and breath sounds checked- equal and bilateral Tube secured with: Tape Dental Injury: Teeth and Oropharynx as per pre-operative assessment

## 2016-11-01 NOTE — Interval H&P Note (Signed)
History and Physical Interval Note:  11/01/2016 12:13 PM  [name]  has presented today for surgery, with the diagnosis of Large right inguinal hernia  The various methods of treatment have been discussed with the patient and family. After consideration of risks, benefits and other options for treatment, the patient has consented to  Procedure(s): RIGHT INGUINAL HERNIA REPAIR WITH MESH (Right) INSERTION OF MESH (Right) as a surgical intervention .  The patient's history has been reviewed, patient examined, no change in status, stable for surgery.  I have reviewed the patient's chart and labs.  Questions were answered to the patient's satisfaction.     Garet Hooton [place]

## 2016-11-01 NOTE — Op Note (Signed)
OPERATIVE NOTE-INGUINAL HERNIA REPAIR  Preoperative diagnosis:  Large right inguinal hernia.  Postoperative diagnosis:  Same  Procedure:  Right inguinal hernia repair with mesh.  Surgeon:  Avel Peace, M.D.  Anesthesia:  General with local (Marcaine).  Indication:  This is a 76 year old male with a large right inguinal hernia descending down into his scrotum.  He now presents for elective repair.  Technique:  He was seen in the holding room and the right groin was marked with my initials. He was brought to the operating, placed supine on the operating table, and the anesthetic was administered by the anesthesiologist. A TAP block was perfomed. The hair in the groin area was clipped as was felt to be necessary. This area was then sterilely prepped and draped. A timeout was performed.  Local anesthetic was infiltrated in the superficial and deep tissues in the right groin.  An incision was made through the skin and subcutaneous tissue until the external oblique aponeurosis was identified.  Local anesthetic was infiltrated deep to the external oblique aponeurosis. The external oblique aponeurosis was divided through the external ring medially and back toward the anterior superior iliac spine laterally. Using blunt dissection, the shelving edge of the inguinal ligament was identified inferiorly and the internal oblique aponeurosis and muscle were identified superiorly. The ilioinguinal nerve was identified and preserved.  The spermatic cord was isolated and a posterior window was made around it. A large indirect hernia sac was identified and separated from the spermatic cord using blunt dissection. The hernia sac and its contents were reduced through the indirect hernia defect.   A piece of 3" x 6" polypropylene mesh was brought into the field and anchored 1-2 cm medial to the pubic tubercle with 2-0 Prolene suture. The inferior aspect of the mesh was anchored to the shelving edge of the inguinal  ligament with running 2-0 Prolene suture to a level 1-2 cm lateral to the internal ring. A slit was cut in the mesh creating 2 tails. These were wrapped around the spermatic cord. The superior aspect of the mesh was anchored to the internal oblique aponeurosis and muscle with interrupted 2-0 Vicryl sutures. The 2 tails of the mesh were then crossed creating a new internal ring and were anchored to the shelving edge of the inguinal ligament with 2-0 Prolene suture. The tip of a hemostat could be placed through the new aperture. The lateral aspect of the mesh was then tucked deep to the external oblique aponeurosis.  The wound was inspected and hemostasis was adequate. The external oblique aponeurosis was then closed over the mesh and cord with running 3-0 Vicryl suture. The subcutaneous tissue was closed with running 3-0 Vicryl suture. The skin closed with a running 4-0 Monocryl subcuticular stitch.  Steri-Strips and a sterile dressing were applied.  The procedure was well-tolerated without any apparent complications and he was taken to the recovery room in satisfactory condition.

## 2016-11-01 NOTE — Anesthesia Preprocedure Evaluation (Signed)
Anesthesia Evaluation  Patient identified by MRN, date of birth, ID band Patient awake    Reviewed: Allergy & Precautions, NPO status , Patient's Chart, lab work & pertinent test results  History of Anesthesia Complications Negative for: history of anesthetic complications  Airway Mallampati: II  TM Distance: >3 FB Neck ROM: Full    Dental  (+) Upper Dentures, Lower Dentures   Pulmonary asthma , COPD, former smoker,     + wheezing      Cardiovascular hypertension, Pt. on medications + CAD, + Past MI, + CABG and + Peripheral Vascular Disease   Rhythm:Regular     Neuro/Psych negative neurological ROS  negative psych ROS   GI/Hepatic negative GI ROS, Neg liver ROS,   Endo/Other  negative endocrine ROS  Renal/GU negative Renal ROS     Musculoskeletal   Abdominal   Peds  Hematology negative hematology ROS (+)   Anesthesia Other Findings   Reproductive/Obstetrics                             Anesthesia Physical Anesthesia Plan  ASA: III  Anesthesia Plan: General   Post-op Pain Management:  Regional for Post-op pain   Induction: Intravenous  Airway Management Planned: Oral ETT  Additional Equipment: None  Intra-op Plan:   Post-operative Plan: Extubation in OR  Informed Consent: I have reviewed the patients History and Physical, chart, labs and discussed the procedure including the risks, benefits and alternatives for the proposed anesthesia with the patient or authorized representative who has indicated his/her understanding and acceptance.   Dental advisory given  Plan Discussed with: CRNA and Surgeon  Anesthesia Plan Comments:         Anesthesia Quick Evaluation

## 2016-11-01 NOTE — Transfer of Care (Deleted)
Immediate Anesthesia Transfer of Care Note  Patient: [name]  Procedure(s) Performed: Procedure(s): RIGHT INGUINAL HERNIA REPAIR WITH MESH (Right) INSERTION OF MESH (Right)  Patient Location: PACU  Anesthesia Type:GA combined with regional for post-op pain  Level of Consciousness: awake, alert  and oriented  Airway & Oxygen Therapy: Patient Spontanous Breathing and Patient connected to nasal cannula oxygen  Post-op Assessment: Report given to RN, Post -op Vital signs reviewed and stable and Patient moving all extremities X 4  Post vital signs: Reviewed and stable  Last Vitals:  Vitals:   11/01/16 0947  BP: (!) 153/69  Pulse: 76  Resp: 18  Temp: 36.7 C    Last Pain:  Vitals:   11/01/16 0947  TempSrc: Oral      Patients Stated Pain Goal: 3 (11/01/16 1026)  Complications: No apparent anesthesia complications 

## 2016-11-01 NOTE — Discharge Instructions (Signed)
[facility] _______[facility]   INGUINAL HERNIA REPAIR: POST OP INSTRUCTIONS  Always review your discharge instruction sheet given to you by the facility where your surgery was performed. IF YOU HAVE DISABILITY OR FAMILY LEAVE FORMS, YOU MUST BRING THEM TO THE OFFICE FOR PROCESSING.   DO NOT GIVE THEM TO YOUR DOCTOR.  1. A  prescription for pain medication may be given to you upon discharge.  Take your pain medication as prescribed, if needed.  If narcotic pain medicine is not needed, then you may take acetaminophen (Tylenol) or ibuprofen (Advil) as needed. 2. Take your usually prescribed medications unless otherwise directed. 3. If you need a refill on your pain medication, please contact your pharmacy.  They will contact our office to request authorization. Prescriptions will not be filled after 5 pm or on week-ends. 4. You should follow a light diet the first 24 hours after arrival home, such as soup and crackers, etc.  Be sure to include lots of fluids daily.  Resume your normal diet the day after surgery. 5. Most patients will experience some swelling and bruising around the umbilicus or in the groin and scrotum.  Ice packs and reclining will help.  Swelling and bruising can take several days to resolve.  6. It is common to experience some constipation if taking pain medication after surgery.  Increasing fluid intake and taking a stool softener (such as Colace) will usually help or prevent this problem from occurring.  A mild laxative (Milk of Magnesia or Miralax) should be taken according to package directions if there are no bowel movements after 48 hours. 7. Unless discharge instructions indicate otherwise, you may remove your bandages 72 hours after surgery.  You may shower the day after surgery.  You may have steri-strips (small skin tapes) in place directly over the incision.  These strips should be left on the skin until they fall off.  If your surgeon used skin glue on the  incision, you may shower in 24 hours.  The glue will flake off over the next 2-3 weeks.  Any sutures or staples will be removed at the office during your follow-up visit. 8. ACTIVITIES:  You may resume light daily activities beginning the next day--such as daily self-care, walking, climbing stairs--gradually increasing activities as tolerated. Do not lie flat for the first 2-3 days. You may have sexual intercourse when it is comfortable.  Refrain from any heavy lifting or straining-nothing over 10 pounds for 6 weeks.   a. You may drive when you are no longer taking prescription pain medication, you can comfortably wear a seatbelt, and you can safely maneuver your car and apply brakes. b. RETURN TO WORK:  _Desk type work in one week, full duty in 6 weeks._________________________________________________________ 9. You should see your doctor in the office for a follow-up appointment approximately 2-3 weeks after your surgery.  Make sure that you call for this appointment within a day or two after you arrive home to insure a convenient appointment time. 10. OTHER INSTRUCTIONS:  _Restart Aspirin and Fish Oil 11/04/16._________________________________________________________________________________________________________________________________________________________________________________________  WHEN TO CALL YOUR DOCTOR: 1. Fever over 101.0 2. Inability to urinate 3. Nausea and/or vomiting 4. Extreme swelling or bruising 5. Continued bleeding from incision. 6. Increased pain, redness, or drainage from the incision  The clinic staff is available to answer your questions during regular business hours.  Please dont hesitate to call and ask to speak to one of the nurses for clinical concerns.  If you have a medical  emergency, go to the nearest emergency room or call 911.  A surgeon from [facility] is always on call at the hospital   [address] [place], [place], [place]   [address] ?  [address], [place], [place]   [address] [phone] ? [phone] ? FAX [phone] Web site: [email or web address]

## 2016-11-01 NOTE — Transfer of Care (Signed)
Immediate Anesthesia Transfer of Care Note  Patient: [name]  Procedure(s) Performed: Procedure(s): RIGHT INGUINAL HERNIA REPAIR WITH MESH (Right) INSERTION OF MESH (Right)  Patient Location: PACU  Anesthesia Type:GA combined with regional for post-op pain  Level of Consciousness: awake, alert  and oriented  Airway & Oxygen Therapy: Patient Spontanous Breathing and Patient connected to nasal cannula oxygen  Post-op Assessment: Report given to RN, Post -op Vital signs reviewed and stable and Patient moving all extremities X 4  Post vital signs: Reviewed and stable  Last Vitals:  Vitals:   11/01/16 0947  BP: (!) 153/69  Pulse: 76  Resp: 18  Temp: 36.7 C    Last Pain:  Vitals:   11/01/16 0947  TempSrc: Oral      Patients Stated Pain Goal: 3 (11/01/16 1026)  Complications: No apparent anesthesia complications

## 2016-11-01 NOTE — Anesthesia Procedure Notes (Signed)
Anesthesia Regional Block:  TAP block  Pre-Anesthetic Checklist: ,, timeout performed, Correct Patient, Correct Site, Correct Laterality, Correct Procedure, Correct Position, site marked, Risks and benefits discussed,  Surgical consent,  Pre-op evaluation,  At surgeon's request and post-op pain management  Laterality: Right  Prep: chloraprep       Needles:  Injection technique: Single-shot  Needle Type: Echogenic Stimulator Needle          Additional Needles:  Procedures: ultrasound guided (picture in chart) TAP block Narrative:  Start time: 11/01/2016 12:29 PM End time: 11/01/2016 12:36 PM Injection made incrementally with aspirations every 5 mL.  Performed by: Personally  Anesthesiologist: Malajah Oceguera  Additional Notes: H+P and labs reviewed, risks and benefits discussed with patient, procedure tolerated well without complications

## 2016-11-04 ENCOUNTER — Other Ambulatory Visit: Payer: Self-pay | Admitting: Cardiology

## 2016-11-04 NOTE — Telephone Encounter (Signed)
Rx(s) sent to pharmacy electronically.  

## 2016-11-05 ENCOUNTER — Encounter (HOSPITAL_COMMUNITY): Payer: Self-pay | Admitting: General Surgery

## 2016-11-06 ENCOUNTER — Encounter (HOSPITAL_COMMUNITY): Payer: Self-pay | Admitting: General Surgery

## 2016-11-06 NOTE — Anesthesia Postprocedure Evaluation (Addendum)
Anesthesia Post Note  Patient: [name]  Procedure(s) Performed: Procedure(s) (LRB): RIGHT INGUINAL HERNIA REPAIR WITH MESH (Right) INSERTION OF MESH (Right)  Patient location during evaluation: PACU Anesthesia Type: Regional Level of consciousness: awake Pain management: pain level controlled Vital Signs Assessment: post-procedure vital signs reviewed and stable Respiratory status: spontaneous breathing Cardiovascular status: stable Postop Assessment: no signs of nausea or vomiting Anesthetic complications: no       Last Vitals:  Vitals:   11/01/16 1443 11/01/16 1458  BP: (!) 144/76 136/82  Pulse: 80 71  Resp: 17 17  Temp:      Last Pain:  Vitals:   11/01/16 0947  TempSrc: Oral                 Fraya Ueda

## 2016-11-25 ENCOUNTER — Other Ambulatory Visit: Payer: Self-pay | Admitting: Cardiology

## 2016-11-27 ENCOUNTER — Other Ambulatory Visit: Payer: Self-pay

## 2016-11-27 MED ORDER — ROSUVASTATIN CALCIUM 40 MG PO TABS: 40.0000 mg | ORAL_TABLET | Freq: Every day | ORAL | 0 refills | Status: DC

## 2016-12-05 ENCOUNTER — Ambulatory Visit (HOSPITAL_COMMUNITY)
Admission: RE | Admit: 2016-12-05 | Discharge: 2016-12-05 | Disposition: A | Discharge status: Home / Self Care | Payer: Medicare Other | Source: Ambulatory Visit | Attending: Cardiology | Admitting: Cardiology

## 2016-12-05 DIAGNOSIS — I779 Disorder of arteries and arterioles, unspecified: Secondary | ICD-10-CM | POA: Diagnosis not present

## 2016-12-05 DIAGNOSIS — I739 Peripheral vascular disease, unspecified: Secondary | ICD-10-CM

## 2016-12-05 DIAGNOSIS — I6523 Occlusion and stenosis of bilateral carotid arteries: Secondary | ICD-10-CM | POA: Insufficient documentation

## 2016-12-05 DIAGNOSIS — Z87891 Personal history of nicotine dependence: Secondary | ICD-10-CM | POA: Diagnosis not present

## 2016-12-05 DIAGNOSIS — E785 Hyperlipidemia, unspecified: Secondary | ICD-10-CM | POA: Diagnosis not present

## 2016-12-05 DIAGNOSIS — I10 Essential (primary) hypertension: Secondary | ICD-10-CM | POA: Diagnosis not present

## 2016-12-05 DIAGNOSIS — I2510 Atherosclerotic heart disease of native coronary artery without angina pectoris: Secondary | ICD-10-CM | POA: Insufficient documentation

## 2016-12-18 NOTE — Progress Notes (Signed)
HPI: FU coronary artery disease. His last cardiac catheterization on August 04, 2006, showed an ejection fraction of 65%. The distal LAD had a 50% stenosis at the origin of the diagonal. The right coronary artery stent was patent. There is a 20% in-stent restenosis. Nuclear study in January of 2014 showed an ejection fraction of 74% and no ischemia. Note an abdominal ultrasound in May 2005 showed no aneurysm. Carotid Dopplers February 2018 showed 40-59% left and occluded right. Follow-up recommended 1 year.  Since I last saw him, the patient denies any dyspnea on exertion, orthopnea, PND, pedal edema, palpitations, syncope or chest pain.   Current Outpatient Prescriptions  Medication Sig Dispense Refill  . bisoprolol (ZEBETA) 5 MG tablet Take 1 tablet (5 mg total) by mouth daily. 60 tablet 2  . hydrochlorothiazide (MICROZIDE) 12.5 MG capsule TAKE 1 CAPSULE(12.5 MG) BY MOUTH DAILY 90 capsule 3  . HYDROcodone-acetaminophen (NORCO/VICODIN) 5-325 MG tablet Take 1-2 tablets by mouth every 4 (four) hours as needed for moderate pain or severe pain. 40 tablet 0  . irbesartan (AVAPRO) 300 MG tablet Take 1 tablet (300 mg total) by mouth daily. 30 tablet 11  . lisinopril (PRINIVIL,ZESTRIL) 40 MG tablet Take 40 mg by mouth daily.  1  . Multiple Vitamin (MULTIVITAMIN) tablet Take 1 tablet by mouth daily.    . rosuvastatin (CRESTOR) 40 MG tablet Take 1 tablet (40 mg total) by mouth daily. (Patient taking differently: Take 40 mg by mouth every evening. ) 30 tablet 10  . Tiotropium Bromide-Olodaterol (STIOLTO RESPIMAT) 2.5-2.5 MCG/ACT AERS Inhale 2 puffs into the lungs daily. 1 Inhaler 0   No current facility-administered medications for this visit.      Past Medical History:  Diagnosis Date  . Asthma    as a child  . Carotid arterial disease (HCC)    a. 11/2015 Carotid U/S: LICA 40-59%, RICA 100 CTO, nl subclavian arteries--f/u 1 yr.  . Coronary atherosclerosis of native coronary artery    a.  09/2001 inflat MI/PCI: RCA 38m (3.0x18 AVE S7 BMS); b. 07/2006 Cath: LM nl, LAD 50p, LCX min irregs, OM1 large, min irregs, OM2 min irregs, RCA 20 ISR;  c. 10/2012 MV: EF 74%, no ischemia.  . Essential hypertension   . Heart murmur    since a child  . Hyperlipidemia   . Myocardial infarction 2002  . Pneumonia    age 76  . Right inguinal hernia     Past Surgical History:  Procedure Laterality Date  . CARDIAC CATHETERIZATION    . Carotid stents    . COLONOSCOPY    . HERNIA REPAIR     ventral  . INGUINAL HERNIA REPAIR Right 11/01/2016   Procedure: RIGHT INGUINAL HERNIA REPAIR WITH MESH;  Surgeon: Avel Peace, MD;  Location: [facility] OR;  Service: General;  Laterality: Right;  . INSERTION OF MESH Right 11/01/2016   Procedure: INSERTION OF MESH;  Surgeon: Avel Peace, MD;  Location: [facility] OR;  Service: General;  Laterality: Right;  . ORIF SHOULDER FRACTURE Right     Social History   Social History  . Marital status: Married    Spouse name: N/A  . Number of children: N/A  . Years of education: N/A   Occupational History  . Not on file.   Social History Main Topics  . Smoking status: Former Smoker    Packs/day: 1.00    Years: 44.00    Types: Cigarettes    Quit date: 10/28/1998  . Smokeless tobacco:  Former User    Types: Chew     Comment: quit chewing tobacco 2001 ish  . Alcohol use No  . Drug use: No  . Sexual activity: Not on file   Other Topics Concern  . Not on file   Social History Narrative  . No narrative on file    Family History  Problem Relation Age of Onset  . Family history unknown: Yes    ROS: no fevers or chills, productive cough, hemoptysis, dysphasia, odynophagia, melena, hematochezia, dysuria, hematuria, rash, seizure activity, orthopnea, PND, pedal edema, claudication. Remaining systems are negative.  Physical Exam: Well-developed well-nourished in no acute distress.  Skin is warm and dry.  HEENT is normal.  Neck is supple. No bruits Chest is  clear to auscultation with normal expansion.  Cardiovascular exam is regular rate and rhythm.  Abdominal exam nontender or distended. No masses palpated. Extremities show no edema. neuro grossly intact   A/P  1 coronary artery disease-continue aspirin and statin.  2 carotid artery disease-plan follow-up carotid Dopplers February 2019.  3 hypertension-blood pressure controlled. However he is on both an ARB and an ACE inhibitor. Discontinue lisinopril and follow blood pressure at home. If it increases we will add amlodipine.  4 hyperlipidemia-continue statin.   Olga Millers, MD

## 2016-12-23 ENCOUNTER — Ambulatory Visit (INDEPENDENT_AMBULATORY_CARE_PROVIDER_SITE_OTHER): Payer: Medicare Other | Admitting: Cardiology

## 2016-12-23 ENCOUNTER — Encounter: Payer: Self-pay | Admitting: Cardiology

## 2016-12-23 VITALS — BP 130/72 | HR 70 | Ht 69.0 in | Wt 156.0 lb

## 2016-12-23 DIAGNOSIS — I2510 Atherosclerotic heart disease of native coronary artery without angina pectoris: Secondary | ICD-10-CM | POA: Diagnosis not present

## 2016-12-23 DIAGNOSIS — I10 Essential (primary) hypertension: Secondary | ICD-10-CM

## 2016-12-23 NOTE — Patient Instructions (Signed)
Medication Instructions:   STOP LISINOPRIL  Follow-Up:  Your physician wants you to follow-up in: ONE YEAR WITH DR CRENSHAW You will receive a reminder letter in the mail two months in advance. If you don't receive a letter, please call our office to schedule the follow-up appointment.   If you need a refill on your cardiac medications before your next appointment, please call your pharmacy.    

## 2017-01-21 ENCOUNTER — Emergency Department (HOSPITAL_COMMUNITY): Payer: Medicare Other

## 2017-01-21 ENCOUNTER — Inpatient Hospital Stay (HOSPITAL_COMMUNITY)
Admission: EM | Admit: 2017-01-21 | Discharge: 2017-01-24 | DRG: 193 | Disposition: A | Discharge status: Home / Self Care | Payer: Medicare Other | Attending: Internal Medicine | Admitting: Internal Medicine

## 2017-01-21 ENCOUNTER — Encounter (HOSPITAL_COMMUNITY): Payer: Self-pay

## 2017-01-21 DIAGNOSIS — I2510 Atherosclerotic heart disease of native coronary artery without angina pectoris: Secondary | ICD-10-CM | POA: Diagnosis present

## 2017-01-21 DIAGNOSIS — J449 Chronic obstructive pulmonary disease, unspecified: Secondary | ICD-10-CM | POA: Diagnosis present

## 2017-01-21 DIAGNOSIS — E861 Hypovolemia: Secondary | ICD-10-CM | POA: Diagnosis present

## 2017-01-21 DIAGNOSIS — I10 Essential (primary) hypertension: Secondary | ICD-10-CM | POA: Diagnosis not present

## 2017-01-21 DIAGNOSIS — Z880 Allergy status to penicillin: Secondary | ICD-10-CM | POA: Diagnosis not present

## 2017-01-21 DIAGNOSIS — Z7951 Long term (current) use of inhaled steroids: Secondary | ICD-10-CM

## 2017-01-21 DIAGNOSIS — J440 Chronic obstructive pulmonary disease with acute lower respiratory infection: Secondary | ICD-10-CM | POA: Diagnosis present

## 2017-01-21 DIAGNOSIS — E785 Hyperlipidemia, unspecified: Secondary | ICD-10-CM | POA: Diagnosis present

## 2017-01-21 DIAGNOSIS — J189 Pneumonia, unspecified organism: Secondary | ICD-10-CM | POA: Diagnosis not present

## 2017-01-21 DIAGNOSIS — I5030 Unspecified diastolic (congestive) heart failure: Secondary | ICD-10-CM | POA: Diagnosis not present

## 2017-01-21 DIAGNOSIS — N179 Acute kidney failure, unspecified: Secondary | ICD-10-CM | POA: Diagnosis present

## 2017-01-21 DIAGNOSIS — I252 Old myocardial infarction: Secondary | ICD-10-CM | POA: Diagnosis not present

## 2017-01-21 DIAGNOSIS — Z955 Presence of coronary angioplasty implant and graft: Secondary | ICD-10-CM

## 2017-01-21 DIAGNOSIS — Z8249 Family history of ischemic heart disease and other diseases of the circulatory system: Secondary | ICD-10-CM

## 2017-01-21 DIAGNOSIS — Z87891 Personal history of nicotine dependence: Secondary | ICD-10-CM

## 2017-01-21 DIAGNOSIS — J4540 Moderate persistent asthma, uncomplicated: Secondary | ICD-10-CM | POA: Diagnosis present

## 2017-01-21 DIAGNOSIS — R918 Other nonspecific abnormal finding of lung field: Secondary | ICD-10-CM | POA: Diagnosis not present

## 2017-01-21 DIAGNOSIS — Z7982 Long term (current) use of aspirin: Secondary | ICD-10-CM

## 2017-01-21 DIAGNOSIS — R748 Abnormal levels of other serum enzymes: Secondary | ICD-10-CM | POA: Diagnosis not present

## 2017-01-21 DIAGNOSIS — I248 Other forms of acute ischemic heart disease: Secondary | ICD-10-CM | POA: Diagnosis present

## 2017-01-21 DIAGNOSIS — E876 Hypokalemia: Secondary | ICD-10-CM | POA: Diagnosis present

## 2017-01-21 DIAGNOSIS — J181 Lobar pneumonia, unspecified organism: Secondary | ICD-10-CM

## 2017-01-21 DIAGNOSIS — J90 Pleural effusion, not elsewhere classified: Secondary | ICD-10-CM | POA: Diagnosis not present

## 2017-01-21 DIAGNOSIS — J9601 Acute respiratory failure with hypoxia: Secondary | ICD-10-CM | POA: Diagnosis not present

## 2017-01-21 LAB — CBC
HEMATOCRIT: 42.8 % (ref 39.0–52.0)
HEMOGLOBIN: 14.2 g/dL (ref 13.0–17.0)
MCH: 31.9 pg (ref 26.0–34.0)
MCHC: 33.2 g/dL (ref 30.0–36.0)
MCV: 96.2 fL (ref 78.0–100.0)
Platelets: 251 10*3/uL (ref 150–400)
RBC: 4.45 MIL/uL (ref 4.22–5.81)
RDW: 14.5 % (ref 11.5–15.5)
WBC: 18.4 10*3/uL — AB (ref 4.0–10.5)

## 2017-01-21 LAB — MAGNESIUM: MAGNESIUM: 2 mg/dL (ref 1.7–2.4)

## 2017-01-21 LAB — I-STAT TROPONIN, ED
TROPONIN I, POC: 0.02 ng/mL (ref 0.00–0.08)
Troponin i, poc: 0 ng/mL (ref 0.00–0.08)

## 2017-01-21 LAB — BASIC METABOLIC PANEL
ANION GAP: 11 (ref 5–15)
BUN: 29 mg/dL — ABNORMAL HIGH (ref 6–20)
CHLORIDE: 93 mmol/L — AB (ref 101–111)
CO2: 32 mmol/L (ref 22–32)
Calcium: 9.2 mg/dL (ref 8.9–10.3)
Creatinine, Ser: 1.71 mg/dL — ABNORMAL HIGH (ref 0.61–1.24)
GFR calc non Af Amer: 37 mL/min — ABNORMAL LOW (ref >60)
GFR, EST AFRICAN AMERICAN: 43 mL/min — AB (ref >60)
Glucose, Bld: 143 mg/dL — ABNORMAL HIGH (ref 65–99)
POTASSIUM: 2.9 mmol/L — AB (ref 3.5–5.1)
SODIUM: 136 mmol/L (ref 135–145)

## 2017-01-21 LAB — I-STAT CG4 LACTIC ACID, ED: Lactic Acid, Venous: 1.35 mmol/L (ref 0.5–1.9)

## 2017-01-21 MED ORDER — ASPIRIN EC 81 MG PO TBEC
81.0000 mg | DELAYED_RELEASE_TABLET | Freq: Every day | ORAL | Status: DC
  Administered 2017-01-22 – 2017-01-24 (×3): 81 mg via ORAL
  Filled 2017-01-21 (×3): qty 1

## 2017-01-21 MED ORDER — ACETAMINOPHEN 325 MG PO TABS: 650.0000 mg | ORAL_TABLET | Freq: Four times a day (QID) | ORAL | Status: DC | PRN

## 2017-01-21 MED ORDER — ONDANSETRON HCL 4 MG PO TABS: 4.0000 mg | ORAL_TABLET | Freq: Four times a day (QID) | ORAL | Status: DC | PRN

## 2017-01-21 MED ORDER — ORAL CARE MOUTH RINSE
15.0000 mL | Freq: Two times a day (BID) | OROMUCOSAL | Status: DC
  Administered 2017-01-22 – 2017-01-24 (×3): 15 mL via OROMUCOSAL

## 2017-01-21 MED ORDER — IPRATROPIUM-ALBUTEROL 0.5-2.5 (3) MG/3ML IN SOLN
3.0000 mL | RESPIRATORY_TRACT | Status: DC
  Administered 2017-01-22: 3 mL via RESPIRATORY_TRACT
  Filled 2017-01-21 (×2): qty 3

## 2017-01-21 MED ORDER — IRBESARTAN 300 MG PO TABS: 300.0000 mg | ORAL_TABLET | Freq: Every day | ORAL | Status: DC

## 2017-01-21 MED ORDER — LEVOFLOXACIN IN D5W 750 MG/150ML IV SOLN: 750.0000 mg | Freq: Once | INTRAVENOUS | Status: DC

## 2017-01-21 MED ORDER — ACETAMINOPHEN 650 MG RE SUPP: 650.0000 mg | Freq: Four times a day (QID) | RECTAL | Status: DC | PRN

## 2017-01-21 MED ORDER — BISOPROLOL FUMARATE 5 MG PO TABS
5.0000 mg | ORAL_TABLET | Freq: Every day | ORAL | Status: DC
  Administered 2017-01-22 – 2017-01-24 (×3): 5 mg via ORAL
  Filled 2017-01-21 (×3): qty 1

## 2017-01-21 MED ORDER — ROSUVASTATIN CALCIUM 20 MG PO TABS
40.0000 mg | ORAL_TABLET | Freq: Every evening | ORAL | Status: DC
  Administered 2017-01-22 – 2017-01-23 (×2): 40 mg via ORAL
  Filled 2017-01-21 (×2): qty 2

## 2017-01-21 MED ORDER — SODIUM CHLORIDE 0.9 % IV BOLUS (SEPSIS)
1000.0000 mL | Freq: Once | INTRAVENOUS | Status: AC
  Administered 2017-01-21: 1000 mL via INTRAVENOUS

## 2017-01-21 MED ORDER — IPRATROPIUM-ALBUTEROL 0.5-2.5 (3) MG/3ML IN SOLN: 3.0000 mL | RESPIRATORY_TRACT | Status: DC | PRN

## 2017-01-21 MED ORDER — SODIUM CHLORIDE 0.9 % IV BOLUS (SEPSIS)
250.0000 mL | Freq: Once | INTRAVENOUS | Status: AC
  Administered 2017-01-21: 250 mL via INTRAVENOUS

## 2017-01-21 MED ORDER — ADULT MULTIVITAMIN W/MINERALS CH
1.0000 | ORAL_TABLET | Freq: Every day | ORAL | Status: DC
  Administered 2017-01-22 – 2017-01-24 (×3): 1 via ORAL
  Filled 2017-01-21 (×3): qty 1

## 2017-01-21 MED ORDER — LEVOFLOXACIN IN D5W 500 MG/100ML IV SOLN
500.0000 mg | INTRAVENOUS | Status: DC
  Administered 2017-01-22 – 2017-01-23 (×2): 500 mg via INTRAVENOUS
  Filled 2017-01-21 (×3): qty 100

## 2017-01-21 MED ORDER — SODIUM CHLORIDE 0.9 % IV SOLN
INTRAVENOUS | Status: DC
  Administered 2017-01-21 – 2017-01-22 (×2): via INTRAVENOUS

## 2017-01-21 MED ORDER — LEVOFLOXACIN IN D5W 500 MG/100ML IV SOLN
500.0000 mg | Freq: Once | INTRAVENOUS | Status: AC
  Administered 2017-01-21: 500 mg via INTRAVENOUS
  Filled 2017-01-21: qty 100

## 2017-01-21 MED ORDER — ENOXAPARIN SODIUM 40 MG/0.4ML ~~LOC~~ SOLN
40.0000 mg | SUBCUTANEOUS | Status: DC
  Administered 2017-01-22 – 2017-01-24 (×3): 40 mg via SUBCUTANEOUS
  Filled 2017-01-21 (×4): qty 0.4

## 2017-01-21 MED ORDER — POTASSIUM CHLORIDE CRYS ER 20 MEQ PO TBCR
40.0000 meq | EXTENDED_RELEASE_TABLET | Freq: Once | ORAL | Status: AC
  Administered 2017-01-21: 40 meq via ORAL
  Filled 2017-01-21: qty 2

## 2017-01-21 MED ORDER — METHYLPREDNISOLONE SODIUM SUCC 125 MG IJ SOLR
125.0000 mg | Freq: Once | INTRAMUSCULAR | Status: AC
  Administered 2017-01-21: 125 mg via INTRAVENOUS
  Filled 2017-01-21: qty 2

## 2017-01-21 MED ORDER — ONDANSETRON HCL 4 MG/2ML IJ SOLN: 4.0000 mg | Freq: Four times a day (QID) | INTRAMUSCULAR | Status: DC | PRN

## 2017-01-21 MED ORDER — IPRATROPIUM-ALBUTEROL 0.5-2.5 (3) MG/3ML IN SOLN
3.0000 mL | Freq: Once | RESPIRATORY_TRACT | Status: AC
  Administered 2017-01-21: 3 mL via RESPIRATORY_TRACT
  Filled 2017-01-21: qty 3

## 2017-01-21 NOTE — ED Provider Notes (Addendum)
[facility]-EMERGENCY DEPT Provider Note   CSN: [id number] Arrival date & time: 01/21/17  1300     History   Chief Complaint Chief Complaint  Patient presents with  . Shortness of Breath    HPI [name] is a 76 y.o. male.  The history is provided by the patient and medical records. No language interpreter was used.   [name] is a 76 y.o. male  with a PMH of CAD with prior MI, HTN, HLD who presents to the Emergency Department with wife and daughter for worsening shortness of breath 1 week associated with productive cough and wheezing. Patient has a history of asthma which he uses an inhaler daily, however, is different from baseline. Family denies any fevers, however does note that he has been very cold and shivering a good bit lately. Other than his inhalers/daily meds, he has taken no medications for symptoms. He had an inguinal hernia surgery in January but no other hospitalizations or illness since then. 44 pack year smoking history. No chest pain, abdominal pain, back pain, n/v/d, diaphoresis.   Past Medical History:  Diagnosis Date  . Asthma    as a child  . Carotid arterial disease (HCC)    a. 11/2015 Carotid U/S: LICA 40-59%, RICA 100 CTO, nl subclavian arteries--f/u 1 yr.  . Coronary atherosclerosis of native coronary artery    a. 09/2001 inflat MI/PCI: RCA 32m (3.0x18 AVE S7 BMS); b. 07/2006 Cath: LM nl, LAD 50p, LCX min irregs, OM1 large, min irregs, OM2 min irregs, RCA 20 ISR;  c. 10/2012 MV: EF 74%, no ischemia.  . Essential hypertension   . Heart murmur    since a child  . Hyperlipidemia   . Myocardial infarction 2002  . Pneumonia    age 29  . Right inguinal hernia     Patient Active Problem List   Diagnosis Date Noted  . Demand ischemia of myocardium (HCC)   . CAP (community acquired pneumonia) 01/21/2017  . Acute respiratory failure with hypoxia (HCC) 01/21/2017  . Hyperlipidemia   . Essential hypertension   . Right inguinal hernia   . COPD III/  hypercarbic 10/07/2016  . Chronic respiratory failure with hypercapnia (HCC) 10/07/2016  . Pure hyperglyceridemia 09/04/2016  . Non-recurrent inguinal hernia with obstruction without gangrene 09/04/2016  . Moderate persistent asthma without complication 09/04/2016  . Abnormal tympanic membrane of right ear 01/15/2014  . Essential hypertension, benign 10/10/2009  . Hyperlipidemia LDL goal <70 10/07/2008  . MYOCARDIAL INFARCTION, INFEROLATERAL WALL, INITIAL EPISODE 10/07/2008  . CORONARY ATHEROSCLEROSIS NATIVE CORONARY ARTERY 10/07/2008  . Cerebrovascular disease 10/07/2008    Past Surgical History:  Procedure Laterality Date  . CARDIAC CATHETERIZATION    . Carotid stents    . COLONOSCOPY    . FRACTURE SURGERY    . HERNIA REPAIR     ventral  . INGUINAL HERNIA REPAIR Right 11/01/2016   Procedure: RIGHT INGUINAL HERNIA REPAIR WITH MESH;  Surgeon: Avel Peace, MD;  Location: [facility] OR;  Service: General;  Laterality: Right;  . INSERTION OF MESH Right 11/01/2016   Procedure: INSERTION OF MESH;  Surgeon: Avel Peace, MD;  Location: [facility] OR;  Service: General;  Laterality: Right;  . ORIF SHOULDER FRACTURE Right        Home Medications    Prior to Admission medications   Medication Sig Start Date End Date Taking? Authorizing Provider  bisoprolol (ZEBETA) 5 MG tablet Take 1 tablet (5 mg total) by mouth daily. 10/07/16  Yes Charlaine Dalton  Wert, MD  Multiple Vitamin (MULTIVITAMIN) tablet Take 1 tablet by mouth daily.   Yes Historical Provider, MD  rosuvastatin (CRESTOR) 40 MG tablet Take 1 tablet (40 mg total) by mouth daily. Patient taking differently: Take 40 mg by mouth every evening.  01/09/16  Yes Lewayne Bunting, MD  Tiotropium Bromide-Olodaterol (STIOLTO RESPIMAT) 2.5-2.5 MCG/ACT AERS Inhale 2 puffs into the lungs daily. Patient taking differently: Inhale 3 puffs into the lungs daily.  10/07/16  Yes Nyoka Cowden, MD  albuterol (PROVENTIL) (2.5 MG/3ML) 0.083% nebulizer solution Take 3  mLs (2.5 mg total) by nebulization every 6 (six) hours as needed for wheezing or shortness of breath. 01/24/17   Belkys A Regalado, MD  aspirin EC 81 MG EC tablet Take 1 tablet (81 mg total) by mouth daily. 01/25/17   Belkys A Regalado, MD  levofloxacin (LEVAQUIN) 500 MG tablet Take 1 tablet (500 mg total) by mouth daily. 01/24/17   Alba Cory, MD    Family History Family History  Problem Relation Age of Onset  . Hypertension Other     Social History Social History  Substance Use Topics  . Smoking status: Former Smoker    Packs/day: 1.00    Years: 44.00    Types: Cigarettes    Quit date: 10/28/1998  . Smokeless tobacco: Former Neurosurgeon    Types: Chew     Comment: quit chewing tobacco 2001 ish  . Alcohol use No     Allergies   Penicillins   Review of Systems Review of Systems  Constitutional: Positive for chills. Negative for diaphoresis and fever.  HENT: Negative for congestion.   Eyes: Negative for visual disturbance.  Respiratory: Positive for cough, shortness of breath and wheezing.   Cardiovascular: Negative for chest pain.  Gastrointestinal: Negative for abdominal pain, nausea and vomiting.  Genitourinary: Negative for dysuria.  Musculoskeletal: Negative for back pain and neck pain.  Skin: Negative for rash.  Neurological: Negative for headaches.     Physical Exam Updated Vital Signs BP 139/80 (BP Location: Right Arm)   Pulse 85   Temp 97.9 F (36.6 C) (Oral)   Resp 20   Ht 5\' 9"  (1.753 m) Comment: per patient  Wt 73.5 kg   SpO2 92%   BMI 23.92 kg/m   Physical Exam  Constitutional: He is oriented to person, place, and time. He appears well-developed and well-nourished. No distress.  HENT:  Head: Normocephalic and atraumatic.  Cardiovascular: Normal rate, regular rhythm and normal heart sounds.   No murmur heard. Pulmonary/Chest: No respiratory distress.  Increased effort in breathing with accessory muscle use. Expiratory wheezing bilaterally. 90-92%  O2 on RA.   Abdominal: Soft. He exhibits no distension. There is no tenderness.  Musculoskeletal: He exhibits no edema.  Neurological: He is alert and oriented to person, place, and time.  Skin: Skin is warm and dry.  Nursing note and vitals reviewed.    ED Treatments / Results  Labs (all labs ordered are listed, but only abnormal results are displayed) Labs Reviewed  BASIC METABOLIC PANEL - Abnormal; Notable for the following:       Result Value   Potassium 2.9 (*)    Chloride 93 (*)    Glucose, Bld 143 (*)    BUN 29 (*)    Creatinine, Ser 1.71 (*)    GFR calc non Af Amer 37 (*)    GFR calc Af Amer 43 (*)    All other components within normal limits  CBC - Abnormal;  Notable for the following:    WBC 18.4 (*)    All other components within normal limits  URINALYSIS, ROUTINE W REFLEX MICROSCOPIC - Abnormal; Notable for the following:    Protein, ur 30 (*)    Squamous Epithelial / LPF 0-5 (*)    All other components within normal limits  TROPONIN I - Abnormal; Notable for the following:    Troponin I 0.06 (*)    All other components within normal limits  BASIC METABOLIC PANEL - Abnormal; Notable for the following:    Chloride 99 (*)    Glucose, Bld 202 (*)    BUN 22 (*)    Creatinine, Ser 1.32 (*)    Calcium 8.5 (*)    GFR calc non Af Amer 50 (*)    GFR calc Af Amer 58 (*)    All other components within normal limits  CBC - Abnormal; Notable for the following:    WBC 16.1 (*)    RBC 4.17 (*)    All other components within normal limits  TROPONIN I - Abnormal; Notable for the following:    Troponin I 0.18 (*)    All other components within normal limits  TROPONIN I - Abnormal; Notable for the following:    Troponin I 0.03 (*)    All other components within normal limits  CBC - Abnormal; Notable for the following:    WBC 17.3 (*)    All other components within normal limits  BASIC METABOLIC PANEL - Abnormal; Notable for the following:    Potassium 3.3 (*)     Chloride 98 (*)    Glucose, Bld 120 (*)    BUN 23 (*)    Calcium 8.8 (*)    GFR calc non Af Amer 56 (*)    All other components within normal limits  CBC - Abnormal; Notable for the following:    WBC 14.0 (*)    All other components within normal limits  BASIC METABOLIC PANEL - Abnormal; Notable for the following:    Chloride 97 (*)    Glucose, Bld 117 (*)    Calcium 8.8 (*)    All other components within normal limits  CULTURE, BLOOD (ROUTINE X 2)  CULTURE, BLOOD (ROUTINE X 2)  MAGNESIUM  HIV ANTIBODY (ROUTINE TESTING)  STREP PNEUMONIAE URINARY ANTIGEN  INFLUENZA PANEL BY PCR (TYPE A & B)  LEGIONELLA PNEUMOPHILA SEROGP 1 UR AG  TROPONIN I  I-STAT TROPOININ, ED  I-STAT TROPOININ, ED  I-STAT CG4 LACTIC ACID, ED    EKG  EKG Interpretation  Date/Time:  Tuesday January 21 2017 13:09:35 EDT Ventricular Rate:  83 PR Interval:  152 QRS Duration: 104 QT Interval:  360 QTC Calculation: 423 R Axis:   -59 Text Interpretation:  Normal sinus rhythm Left axis deviation Abnormal ECG Confirmed by Lincoln Brigham [phone]) on 01/21/2017 5:07:09 PM       Radiology No results found.  Procedures Procedures (including critical care time)  Medications Ordered in ED Medications  sodium chloride 0.9 % bolus 1,000 mL (0 mLs Intravenous Stopped 01/21/17 2052)  ipratropium-albuterol (DUONEB) 0.5-2.5 (3) MG/3ML nebulizer solution 3 mL (3 mLs Nebulization Given 01/21/17 1745)  methylPREDNISolone sodium succinate (SOLU-MEDROL) 125 mg/2 mL injection 125 mg (125 mg Intravenous Given 01/21/17 1746)  potassium chloride SA (K-DUR,KLOR-CON) CR tablet 40 mEq (40 mEq Oral Given 01/21/17 1746)  levofloxacin (LEVAQUIN) IVPB 500 mg (0 mg Intravenous Stopped 01/21/17 1928)  sodium chloride 0.9 % bolus 1,000 mL (0 mLs Intravenous Stopped  01/21/17 1938)    And  sodium chloride 0.9 % bolus 250 mL (0 mLs Intravenous Stopped 01/21/17 2052)  potassium chloride SA (K-DUR,KLOR-CON) CR tablet 40 mEq (40 mEq Oral Given 01/23/17  2132)     Initial Impression / Assessment and Plan / ED Course  I have reviewed the triage vital signs and the nursing notes.  Pertinent labs & imaging results that were available during my care of the patient were reviewed by me and considered in my medical decision making (see chart for details).    [name] is a 76 y.o. male who presents to ED for worsening shortness of breath and cough for the last week associated with chills. Upon arrival, and temperature of 98.3 and hemodynamically stable. Rectal temperature obtained when patient was brought back to the room-temp of 100.9. On exam, patient does Have increased effort in breathing with accessory muscle use as well as expiratory wheezing. DuoNeb and steroids given. Chest x-ray concerning for pneumonia. Labs reviewed with white count of 18.4 and new AKI with creatinine of 1.71. Code sepsis called and patient started on weight-based fluids and antibiotics for pneumonia (Levaquin due to PCN allergy). Potassium of 2.9 noted-replenished in ED. Magnesium obtained which was normal. Troponin wdl. Discussed case with [facility] hospitalist who will admit.  Patient seen by and discussed with Dr. Madilyn Hook who agrees with treatment plan.    Final Clinical Impressions(s) / ED Diagnoses   Final diagnoses:  Community acquired pneumonia of left upper lobe of lung (HCC)    New Prescriptions Discharge Medication List as of 01/24/2017  3:49 PM    START taking these medications   Details  albuterol (PROVENTIL) (2.5 MG/3ML) 0.083% nebulizer solution Take 3 mLs (2.5 mg total) by nebulization every 6 (six) hours as needed for wheezing or shortness of breath., Starting Fri 01/24/2017, Print    aspirin EC 81 MG EC tablet Take 1 tablet (81 mg total) by mouth daily., Starting Sat 01/25/2017, Print    levofloxacin (LEVAQUIN) 500 MG tablet Take 1 tablet (500 mg total) by mouth daily., Starting Fri 01/24/2017, Print         [facility] Devin Kucinski, PA-C 01/21/17  1954    Tilden Fossa, MD 01/22/17 0014    [facility]et Devin Johal, PA-C 01/29/17 0505    Tilden Fossa, MD 02/06/17 [phone]

## 2017-01-21 NOTE — ED Notes (Signed)
Pt O2 increased to 3L, pt using pursed lip breathing and increased work of breathing. O2 saturation at 93% on 3L at this time

## 2017-01-21 NOTE — ED Triage Notes (Signed)
Pt here with wife with reports of shortness of breath and generalized weakness. Pt states " I don't feel bad but I know if I fell I wouldn't be able to get myself up." No acute distress noted. Wife has a record of SPO2% at home which was 82-83%.

## 2017-01-21 NOTE — Progress Notes (Signed)
Received report from Michael,RN in the ED. 

## 2017-01-21 NOTE — H&P (Signed)
History and Physical    [name] LDJ:[id number] DOB: Jan 26, 1941 DOA: 01/21/2017  PCP: Sanda Linger, MD  Patient coming from: Home.  Chief Complaint: Shortness of breath.  HPI: [name] is a 76 y.o. male with history of CAD, hypertension, asthma presents to the ER with complaints of shortness of breath. Patient has been having shortness of breath over the last 1 week with productive cough. Denies any chest pain. Has been having subjective feeling of fever and chills. Patient was found to be hypoxic at home and was brought to the ER.   ED Course: In the ER patient was found to be febrile and was requiring at least 3 L of oxygen. Chest x-ray shows infiltrate concerning for pneumonia. Chest patient was hypoxic patient is being admitted for IV antibiotics and further management of pneumonia.  Review of Systems: As per HPI, rest all negative.   Past Medical History:  Diagnosis Date  . Asthma    as a child  . Carotid arterial disease (HCC)    a. 11/2015 Carotid U/S: LICA 40-59%, RICA 100 CTO, nl subclavian arteries--f/u 1 yr.  . Coronary atherosclerosis of native coronary artery    a. 09/2001 inflat MI/PCI: RCA 54m (3.0x18 AVE S7 BMS); b. 07/2006 Cath: LM nl, LAD 50p, LCX min irregs, OM1 large, min irregs, OM2 min irregs, RCA 20 ISR;  c. 10/2012 MV: EF 74%, no ischemia.  . Essential hypertension   . Heart murmur    since a child  . Hyperlipidemia   . Myocardial infarction 2002  . Pneumonia    age 56  . Right inguinal hernia     Past Surgical History:  Procedure Laterality Date  . CARDIAC CATHETERIZATION    . Carotid stents    . COLONOSCOPY    . FRACTURE SURGERY    . HERNIA REPAIR     ventral  . INGUINAL HERNIA REPAIR Right 11/01/2016   Procedure: RIGHT INGUINAL HERNIA REPAIR WITH MESH;  Surgeon: Avel Peace, MD;  Location: [facility] OR;  Service: General;  Laterality: Right;  . INSERTION OF MESH Right 11/01/2016   Procedure: INSERTION OF MESH;  Surgeon: Avel Peace, MD;   Location: [facility] OR;  Service: General;  Laterality: Right;  . ORIF SHOULDER FRACTURE Right      reports that he quit smoking about 18 years ago. His smoking use included Cigarettes. He has a 44.00 pack-year smoking history. He has quit using smokeless tobacco. His smokeless tobacco use included Chew. He reports that he does not drink alcohol or use drugs.  Allergies  Allergen Reactions  . Penicillins Hives and Swelling    Has patient had a PCN reaction causing immediate rash, facial/tongue/throat swelling, SOB or lightheadedness with hypotension: Yes Has patient had a PCN reaction causing severe rash involving mucus membranes or skin necrosis: Yes Has patient had a PCN reaction that required hospitalization No Has patient had a PCN reaction occurring within the last 10 years: No If all of the above answers are "NO", then may proceed with Cephalosporin use.     Family History  Problem Relation Age of Onset  . Hypertension Other     Prior to Admission medications   Medication Sig Start Date End Date Taking? Authorizing Provider  bisoprolol (ZEBETA) 5 MG tablet Take 1 tablet (5 mg total) by mouth daily. 10/07/16  Yes Nyoka Cowden, MD  hydrochlorothiazide (MICROZIDE) 12.5 MG capsule TAKE 1 CAPSULE(12.5 MG) BY MOUTH DAILY 11/04/16  Yes Lewayne Bunting, MD  irbesartan (AVAPRO) 300 MG tablet Take 1 tablet (300 mg total) by mouth daily. 10/07/16  Yes Nyoka Cowden, MD  Multiple Vitamin (MULTIVITAMIN) tablet Take 1 tablet by mouth daily.   Yes Historical Provider, MD  rosuvastatin (CRESTOR) 40 MG tablet Take 1 tablet (40 mg total) by mouth daily. Patient taking differently: Take 40 mg by mouth every evening.  01/09/16  Yes Lewayne Bunting, MD  Tiotropium Bromide-Olodaterol (STIOLTO RESPIMAT) 2.5-2.5 MCG/ACT AERS Inhale 2 puffs into the lungs daily. Patient taking differently: Inhale 3 puffs into the lungs daily.  10/07/16  Yes Nyoka Cowden, MD    Physical Exam: Vitals:   01/21/17 1930  01/21/17 2000 01/21/17 2030 01/21/17 2114  BP: 128/62 117/61  107/60  Pulse: 91 89  89  Resp: (!) 22 (!) 24  18  Temp:    98.4 F (36.9 C)  TempSrc:      SpO2: 94% 95% 95% 95%  Weight:      Height:          Constitutional: Moderately built and nourished. Vitals:   01/21/17 1930 01/21/17 2000 01/21/17 2030 01/21/17 2114  BP: 128/62 117/61  107/60  Pulse: 91 89  89  Resp: (!) 22 (!) 24  18  Temp:    98.4 F (36.9 C)  TempSrc:      SpO2: 94% 95% 95% 95%  Weight:      Height:       Eyes: Anicteric no pallor. ENMT: No discharge from the ears eyes nose or mouth. Neck: No mass felt. No JVD appreciated. Respiratory: No rhonchi or crepitations. Cardiovascular: S1-S2 heard no murmurs appreciated. Abdomen: Soft nontender bowel sounds present. No guarding or rigidity. Musculoskeletal: No edema. No joint effusion. Skin: No rash. Skin appears warm. Neurologic: Alert and awake oriented to time place and person. Moves all extremities. Psychiatric: Appears normal. Normal affect.   Labs on Admission: I have personally reviewed following labs and imaging studies  CBC:  Recent Labs Lab 01/21/17 1328  WBC 18.4*  HGB 14.2  HCT 42.8  MCV 96.2  PLT 251   Basic Metabolic Panel:  Recent Labs Lab 01/21/17 1328 01/21/17 1652  NA 136  --   K 2.9*  --   CL 93*  --   CO2 32  --   GLUCOSE 143*  --   BUN 29*  --   CREATININE 1.71*  --   CALCIUM 9.2  --   MG  --  2.0   GFR: Estimated Creatinine Clearance: 35 mL/min (A) (by C-G formula based on SCr of 1.71 mg/dL (H)). Liver Function Tests: No results for input(s): AST, ALT, ALKPHOS, BILITOT, PROT, ALBUMIN in the last 168 hours. No results for input(s): LIPASE, AMYLASE in the last 168 hours. No results for input(s): AMMONIA in the last 168 hours. Coagulation Profile: No results for input(s): INR, PROTIME in the last 168 hours. Cardiac Enzymes: No results for input(s): CKTOTAL, CKMB, CKMBINDEX, TROPONINI in the last 168  hours. BNP (last 3 results) No results for input(s): PROBNP in the last 8760 hours. HbA1C: No results for input(s): HGBA1C in the last 72 hours. CBG: No results for input(s): GLUCAP in the last 168 hours. Lipid Profile: No results for input(s): CHOL, HDL, LDLCALC, TRIG, CHOLHDL, LDLDIRECT in the last 72 hours. Thyroid Function Tests: No results for input(s): TSH, T4TOTAL, FREET4, T3FREE, THYROIDAB in the last 72 hours. Anemia Panel: No results for input(s): VITAMINB12, FOLATE, FERRITIN, TIBC, IRON, RETICCTPCT in the last 72 hours.  Urine analysis: No results found for: COLORURINE, APPEARANCEUR, LABSPEC, PHURINE, GLUCOSEU, HGBUR, BILIRUBINUR, KETONESUR, PROTEINUR, UROBILINOGEN, NITRITE, LEUKOCYTESUR Sepsis Labs: @LABRCNTIP (procalcitonin:4,lacticidven:4) )No results found for this or any previous visit (from the past 240 hour(s)).   Radiological Exams on Admission: Dg Chest 2 View  Result Date: 01/21/2017 CLINICAL DATA:  Dyspnea and generalized weakness x1 week EXAM: CHEST  2 VIEW COMPARISON:  10/15/2016 CXR FINDINGS: Emphysematous hyperinflation of the lungs. Subtle increase in interstitial prominence and airspace opacities since prior exam in the right middle lobe and subpleural left upper lobe may reflect sequela of mild bronchitic change or pneumonia. Normal sized heart. Aortic atherosclerosis without aneurysm. No confluent pneumonic consolidations. Minimal atelectasis at the lung bases. No overt pulmonary edema. No acute osseous abnormality. IMPRESSION: 1. Emphysematous hyperinflation of the lungs. 2. Subtle increase in airspace opacity and increased interstitial markings in the right middle lobe and subpleural left upper lobe may reflect sequela pneumonia and/or bronchitic change. 3. Aortic atherosclerosis. Electronically Signed   By: Tollie Eth M.D.   On: 01/21/2017 14:31    EKG: Independently reviewed. Normal sinus rhythm.  Assessment/Plan Principal Problem:   CAP (community  acquired pneumonia) Active Problems:   Essential hypertension, benign   CORONARY ATHEROSCLEROSIS NATIVE CORONARY ARTERY   Moderate persistent asthma without complication   COPD III/ hypercarbic   Acute respiratory failure with hypoxia (HCC)    1. Community-acquired pneumonia - patient has been placed on Levaquin. Check influenza PCR urine for Legionella and strep antigen and sputum cultures. 2. CAD status post stenting - denies any chest pain. Patient is on aspirin and statins and beta blocker. 3. Hypertension - continue hydrochlorothiazide ARB and beta blocker. 4. History of asthma/COPD - continue when necessary nebulizer.   DVT prophylaxis: Lovenox. Code Status: Full code.  Family Communication: Patient's wife.  Disposition Plan: Home.  Consults called: None.  Admission status: Inpatient.    Eduard Clos MD [facility] Pager [phone].  If 7PM-7AM, please contact night-coverage [email or web address] Password [facility]  01/21/2017, 11:22 PM

## 2017-01-21 NOTE — ED Notes (Signed)
Pt cleared to eat and drink by EDP.

## 2017-01-21 NOTE — Progress Notes (Signed)
NURSING PROGRESS NOTE  [name] [id number] Admission Data: 01/21/2017 at 2100 Attending Provider: Eduard Clos, MD PCP: Sanda Linger, MD  Allergies:  Allergies  Allergen Reactions  . Penicillins Hives and Swelling    Has patient had a PCN reaction causing immediate rash, facial/tongue/throat swelling, SOB or lightheadedness with hypotension: Yes Has patient had a PCN reaction causing severe rash involving mucus membranes or skin necrosis: Yes Has patient had a PCN reaction that required hospitalization No Has patient had a PCN reaction occurring within the last 10 years: No If all of the above answers are "NO", then may proceed with Cephalosporin use.    Past Medical History:  Past Medical History:  Diagnosis Date  . Asthma    as a child  . Carotid arterial disease (HCC)    a. 11/2015 Carotid U/S: LICA 40-59%, RICA 100 CTO, nl subclavian arteries--f/u 1 yr.  . Coronary atherosclerosis of native coronary artery    a. 09/2001 inflat MI/PCI: RCA 47m (3.0x18 AVE S7 BMS); b. 07/2006 Cath: LM nl, LAD 50p, LCX min irregs, OM1 large, min irregs, OM2 min irregs, RCA 20 ISR;  c. 10/2012 MV: EF 74%, no ischemia.  . Essential hypertension   . Heart murmur    since a child  . Hyperlipidemia   . Myocardial infarction 2002  . Pneumonia    age 65  . Right inguinal hernia    Past Surgical History:  Past Surgical History:  Procedure Laterality Date  . CARDIAC CATHETERIZATION    . Carotid stents    . COLONOSCOPY    . FRACTURE SURGERY    . HERNIA REPAIR     ventral  . INGUINAL HERNIA REPAIR Right 11/01/2016   Procedure: RIGHT INGUINAL HERNIA REPAIR WITH MESH;  Surgeon: Avel Peace, MD;  Location: [facility] OR;  Service: General;  Laterality: Right;  . INSERTION OF MESH Right 11/01/2016   Procedure: INSERTION OF MESH;  Surgeon: Avel Peace, MD;  Location: [facility] OR;  Service: General;  Laterality: Right;  . ORIF SHOULDER FRACTURE Right    [name] is a 76 y.o. male  patient, arrived to floor in room [phone] via stretcher, transferred from ED. Patient alert and oriented X 4. No acute distress noted. Denies pain.   Vital signs: Oral temperature 98.4 F (36.9 C), Blood pressure 107/60, Pulse 89, RR 18, SpO2 95 % on 3.5 liters of oxygen.  Cardiac monitoring: Telemetry box 5W #28 in place. Second verified by Colin Broach, RN  IV access: Left AC and Right forearm-saline locked; condition patent and no redness.  Skin: intact, no pressure ulcer noted in sacral area. Second verified by Colin Broach, RN   Patient's ID armband verified with patient/ family, and in place. Information packet given to patient/ family. Fall risk assessed, SR up X2, patient/ family able to verbalize understanding of risks associated with falls and to call nurse or staff to assist before getting out of bed. Patient/ family oriented to room and equipment. Call bell within reach.

## 2017-01-21 NOTE — ED Notes (Signed)
Pt given urinal at bedside to provide urine sample

## 2017-01-22 ENCOUNTER — Encounter (HOSPITAL_COMMUNITY): Payer: Self-pay | Admitting: Physician Assistant

## 2017-01-22 DIAGNOSIS — R748 Abnormal levels of other serum enzymes: Secondary | ICD-10-CM

## 2017-01-22 DIAGNOSIS — I10 Essential (primary) hypertension: Secondary | ICD-10-CM

## 2017-01-22 DIAGNOSIS — I2510 Atherosclerotic heart disease of native coronary artery without angina pectoris: Secondary | ICD-10-CM

## 2017-01-22 DIAGNOSIS — J9601 Acute respiratory failure with hypoxia: Secondary | ICD-10-CM

## 2017-01-22 LAB — STREP PNEUMONIAE URINARY ANTIGEN: Strep Pneumo Urinary Antigen: NEGATIVE

## 2017-01-22 LAB — BASIC METABOLIC PANEL
ANION GAP: 9 (ref 5–15)
BUN: 22 mg/dL — ABNORMAL HIGH (ref 6–20)
CHLORIDE: 99 mmol/L — AB (ref 101–111)
CO2: 28 mmol/L (ref 22–32)
Calcium: 8.5 mg/dL — ABNORMAL LOW (ref 8.9–10.3)
Creatinine, Ser: 1.32 mg/dL — ABNORMAL HIGH (ref 0.61–1.24)
GFR calc non Af Amer: 50 mL/min — ABNORMAL LOW (ref >60)
GFR, EST AFRICAN AMERICAN: 58 mL/min — AB (ref >60)
Glucose, Bld: 202 mg/dL — ABNORMAL HIGH (ref 65–99)
POTASSIUM: 3.5 mmol/L (ref 3.5–5.1)
SODIUM: 136 mmol/L (ref 135–145)

## 2017-01-22 LAB — TROPONIN I
TROPONIN I: 0.18 ng/mL — AB (ref <0.03)
TROPONIN I: 0.06 ng/mL — AB (ref <0.03)
Troponin I: 0.03 ng/mL (ref <0.03)

## 2017-01-22 LAB — INFLUENZA PANEL BY PCR (TYPE A & B)
INFLAPCR: NEGATIVE
INFLBPCR: NEGATIVE

## 2017-01-22 LAB — URINALYSIS, ROUTINE W REFLEX MICROSCOPIC
BACTERIA UA: NONE SEEN
Bilirubin Urine: NEGATIVE
Glucose, UA: NEGATIVE mg/dL
Hgb urine dipstick: NEGATIVE
Ketones, ur: NEGATIVE mg/dL
Leukocytes, UA: NEGATIVE
Nitrite: NEGATIVE
PROTEIN: 30 mg/dL — AB
Specific Gravity, Urine: 1.018 (ref 1.005–1.030)
pH: 5 (ref 5.0–8.0)

## 2017-01-22 LAB — CBC
HEMATOCRIT: 40.3 % (ref 39.0–52.0)
HEMOGLOBIN: 13.3 g/dL (ref 13.0–17.0)
MCH: 31.9 pg (ref 26.0–34.0)
MCHC: 33 g/dL (ref 30.0–36.0)
MCV: 96.6 fL (ref 78.0–100.0)
Platelets: 234 10*3/uL (ref 150–400)
RBC: 4.17 MIL/uL — AB (ref 4.22–5.81)
RDW: 14.5 % (ref 11.5–15.5)
WBC: 16.1 10*3/uL — ABNORMAL HIGH (ref 4.0–10.5)

## 2017-01-22 LAB — HIV ANTIBODY (ROUTINE TESTING W REFLEX): HIV Screen 4th Generation wRfx: NONREACTIVE

## 2017-01-22 MED ORDER — IPRATROPIUM-ALBUTEROL 0.5-2.5 (3) MG/3ML IN SOLN
3.0000 mL | Freq: Four times a day (QID) | RESPIRATORY_TRACT | Status: DC
  Administered 2017-01-22: 3 mL via RESPIRATORY_TRACT
  Filled 2017-01-22 (×2): qty 3

## 2017-01-22 MED ORDER — IPRATROPIUM-ALBUTEROL 0.5-2.5 (3) MG/3ML IN SOLN
3.0000 mL | Freq: Three times a day (TID) | RESPIRATORY_TRACT | Status: DC
  Administered 2017-01-22 – 2017-01-24 (×6): 3 mL via RESPIRATORY_TRACT
  Filled 2017-01-22 (×6): qty 3

## 2017-01-22 NOTE — Progress Notes (Signed)
CRITICAL VALUE ALERT  Critical value received:  Troponin 0.03  Date of notification:  01/22/17  Time of notification:  18:58  Critical value read back:Yes.    Nurse who received alert:  Midge Aver RN  MD notified (1st page):  Dr. Sunnie Nielsen  Time of first page:  19:00

## 2017-01-22 NOTE — Consult Note (Signed)
[facility] Primary Care Navigator  01/22/2017  [name] Dec 02, 1940 [id number]   Met with patient and wife [name]) at the bedside to identify possible discharge needs. Patient reports having shortness of breath, decreased oxygen level, weakness and fall that had led to this admission.  Patient endorses Dr. Scarlette Calico with [facility] [place] as his primary care provider.   Patient shared using [facility] pharmacy at [facility] to obtain medications without any problem.   Patient verbalized managing his own medications at home straight out of the containers.   He mentioned that he drives prior to admission. Wife states she can provide transportation to his doctors' appointments if needed.  Wife states that she is the primary caregiver at home.   living alone and independent with self care prior to admission.  Her friends Santiago Glad, Seth Bake) and niece will serve as primary caregivers at home per patient.  Anticipated discharge plan is home according to patient.  Patient and wife voiced understanding to call primary care provider's office when he gets home, for a post discharge follow-up appointment within a week or sooner if needed. Patient letter (with PCP's contact number) was provided as a reminder.  Explained to patient and wife regarding [facility] CM services available for health management (PNA and COPD) but he declined services at present.  Wife agreed to talk to primary care provider on follow-up visit to know if patient needs further disease management at home and encouraged to get a referral to [place] services if deemed necessary and appropriate for it.  Provided [facility] care management contact information for future needs that may arise.   For additional questions please contact:  Edwena Felty A. Latrina Guttman, BSN, RN-BC [facility] PRIMARY CARE Navigator Cell: [phone]

## 2017-01-22 NOTE — Consult Note (Signed)
CARDIOLOGY CONSULT NOTE   Patient ID: [name] MRN: [id number] DOB/AGE: [birth year] 76 y.o.  Admit date: 01/21/2017  Primary Physician   Sanda Linger, MD Primary Cardiologist   Dr. Jens Som 12/23/2016 Reason for Consultation   abnormal ECG, elevated troponin Requesting MD: Dr Sunnie Nielsen  [name] is a 76 y.o. year old male with a history of RCA stent 2002, cath 2007 w/ 50% LAD, RCA stent 20% ISR, EF 65%. MV 2014 w/ no ischemia & EF 74%. R-ICA 100%, L-ICA <60% 11/2016, HTN, HLD, SEM, asthma.  At office visit 02/26, pt doing well, was on ACE & ARB, ACE was stopped. BP to be followed.  Pt admitted 03/27 w/ SOB, PNA, hypoxia. Cards asked to see for elevated troponin and abnl ECG.  Pt has not had chest pain. No LE edema, no orthopnea, no PND. He was having problems with cough when he saw Dr Alphonsa Gin, his ACE inhibitor was stopped. However, the cough never got better. He would cough up phlegm occasionally, grayish white. He was not aware of any fevers  Prior to acute illness, he was able to lift (full) trash cans without problems. He had hernia surgery in January, feels he recovered pretty well from that.   Pt lives with his wife. 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 He likes that this keeps him busy. No ischemic symptoms.   Past Medical History:  Diagnosis Date  . Asthma    as a child  . Carotid arterial disease (HCC)    a. 11/2015 Carotid U/S: LICA 40-59%, RICA 100 CTO, nl subclavian arteries--f/u 1 yr.  . Coronary atherosclerosis of native coronary artery    a. 09/2001 inflat MI/PCI: RCA 54m (3.0x18 A[address] S7 BMS); b. 07/2006 Cath: LM nl, LAD 50p, LCX min irregs, OM1 large, min irregs, OM2 min irregs, RCA 20 ISR;  c. 10/2012 MV: EF 74%, no ischemia.  . Essential hypertension   . Heart murmur    since a child  . Hyperlipidemia   . Myocardial infarction 2002  . Pneumonia    age 76  . Right inguinal hernia      Past Surgical History:  Procedure  Laterality Date  . CARDIAC CATHETERIZATION    . Carotid stents    . COLONOSCOPY    . FRACTURE SURGERY    . HERNIA REPAIR     ventral  . INGUINAL HERNIA REPAIR Right 11/01/2016   Procedure: RIGHT INGUINAL HERNIA REPAIR WITH MESH;  Surgeon: Avel Peace, MD;  Location: [facility] OR;  Service: General;  Laterality: Right;  . INSERTION OF MESH Right 11/01/2016   Procedure: INSERTION OF MESH;  Surgeon: Avel Peace, MD;  Location: [facility] OR;  Service: General;  Laterality: Right;  . ORIF SHOULDER FRACTURE Right     Allergies  Allergen Reactions  . Penicillins Hives and Swelling    Has patient had a PCN reaction causing immediate rash, facial/tongue/throat swelling, SOB or lightheadedness with hypotension: Yes Has patient had a PCN reaction causing severe rash involving mucus membranes or skin necrosis: Yes Has patient had a PCN reaction that required hospitalization No Has patient had a PCN reaction occurring within the last 10 years: No If all of the above answers are "NO", then may proceed with Cephalosporin use.     I have reviewed the patient's current medications . aspirin EC  81 mg Oral Daily  . bisoprolol  5 mg Oral Daily  . enoxaparin (LOVENOX) injection  40 mg Subcutaneous Q24H  . ipratropium-albuterol  3 mL Nebulization QID  . levofloxacin (LEVAQUIN) IV  500 mg Intravenous Q24H  . mouth rinse  15 mL Mouth Rinse BID  . multivitamin with minerals  1 tablet Oral Daily  . rosuvastatin  40 mg Oral QPM   . sodium chloride 75 mL/hr at 01/21/17 2330   acetaminophen **OR** acetaminophen, ipratropium-albuterol, ondansetron **OR** ondansetron (ZOFRAN) IV  Prior to Admission medications   Medication Sig Start Date End Date Taking? Authorizing Provider  bisoprolol (ZEBETA) 5 MG tablet Take 1 tablet (5 mg total) by mouth daily. 10/07/16  Yes Nyoka Cowden, MD  hydrochlorothiazide (MICROZIDE) 12.5 MG capsule TAKE 1 CAPSULE(12.5 MG) BY MOUTH DAILY 11/04/16  Yes Lewayne Bunting, MD  irbesartan  (AVAPRO) 300 MG tablet Take 1 tablet (300 mg total) by mouth daily. 10/07/16  Yes Nyoka Cowden, MD  Multiple Vitamin (MULTIVITAMIN) tablet Take 1 tablet by mouth daily.   Yes Historical Provider, MD  rosuvastatin (CRESTOR) 40 MG tablet Take 1 tablet (40 mg total) by mouth daily. Patient taking differently: Take 40 mg by mouth every evening.  01/09/16  Yes Lewayne Bunting, MD  Tiotropium Bromide-Olodaterol (STIOLTO RESPIMAT) 2.5-2.5 MCG/ACT AERS Inhale 2 puffs into the lungs daily. Patient taking differently: Inhale 3 puffs into the lungs daily.  10/07/16  Yes Nyoka Cowden, MD     Social History   Social History  . Marital status: Married    Spouse name: N/A  . Number of children: N/A  . Years of education: N/A   Occupational History  .  Retired   Social History Main Topics  . Smoking status: Former Smoker    Packs/day: 1.00    Years: 44.00    Types: Cigarettes    Quit date: 10/28/1998  . Smokeless tobacco: Former Neurosurgeon    Types: Chew     Comment: quit chewing tobacco 2001 ish  . Alcohol use No  . Drug use: No  . Sexual activity: Not on file   Other Topics Concern  . Not on file   Social History Narrative   Lives in [place] with his wife.    Family Status  Relation Status  . Maternal Grandfather Deceased  . Maternal Grandmother Deceased  . Paternal Grandmother Deceased  . Paternal Grandfather Deceased  . Mother Deceased  . Father Deceased  . Other    Family History  Problem Relation Age of Onset  . Hypertension Other      ROS:  Full 14 point review of systems complete and found to be negative unless listed above.  Physical Exam: Blood pressure (!) 113/59, pulse 80, temperature 97.8 F (36.6 C), temperature source Oral, resp. rate 18, height 5\' 9"  (1.753 m), weight 162 lb (73.5 kg), SpO2 93 %.  General: Well developed, well nourished, male in no acute distress Head: Eyes PERRLA, No xanthomas.   Normocephalic and atraumatic, oropharynx without edema or  exudate. Dentition: poor Lungs: rales L>R bases Heart: HRRR S1 S2, no rub/gallop, soft murmur. pulses are 2+ all 4 extrem.   Neck: No carotid bruits. No lymphadenopathy.  JVD not elevated Abdomen: Bowel sounds present, abdomen soft and non-tender without masses or hernias noted. Msk:  No spine or cva tenderness. No weakness, no joint deformities or effusions. Extremities: No clubbing or cyanosis. No edema.  Neuro: Alert and oriented X 3. No focal deficits noted. Psych:  Good affect, responds appropriately Skin: No rashes or lesions noted.  Labs:  Lab Results  Component Value Date   WBC 16.1 (H) 01/22/2017   HGB 13.3 01/22/2017   HCT 40.3 01/22/2017   MCV 96.6 01/22/2017   PLT 234 01/22/2017    Recent Labs Lab 01/22/17 0038  NA 136  K 3.5  CL 99*  CO2 28  BUN 22*  CREATININE 1.32*  CALCIUM 8.5*  GLUCOSE 202*   Magnesium  Date Value Ref Range Status  01/21/2017 2.0 1.7 - 2.4 mg/dL Final    Recent Labs  16/10/96 0038 01/22/17 0640  TROPONINI 0.06* 0.18*    Recent Labs  01/21/17 1334 01/21/17 1701  TROPIPOC 0.00 0.02    Echo: performed 03/18, not read yet  ECG:  03/27 Sinus rhythm, left axis deviation, and no change from 09-2016  Cath: 2007 FINDINGS:  1. LV:  128/7/13.  EF 65% without regional wall motion abnormality.  2. No aortic stenosis or mitral regurgitation.  3. Left main:  Angiographically normal.  4. LAD:  Moderate-sized vessel giving rise to one diagonal.  The distal      LAD has a 50% stenosis after the origin of the diagonal.  The      downstream vessels below this is quite small.  5. Circumflex:  Large vessel giving rise to two marginals.  The first      marginal arises very high and is large.  The second marginal is      moderate size.  There are only minor luminal irregularities in the      circumflex system.  6. RCA:  Moderate-sized dominant vessel.  There is a previously placed      stent in the midvessel.  There is approximately  20% in-stent      restenosis.  IMPRESSION/PLAN:  Widely patent stent in the right coronary artery.  He has  developed no other significant stenosis.  I suspect a gastrointestinal  etiology to his chest discomfort.  Radiology:  Dg Chest 2 View Result Date: 01/21/2017 CLINICAL DATA:  Dyspnea and generalized weakness x1 week EXAM: CHEST  2 VIEW COMPARISON:  10/15/2016 CXR FINDINGS: Emphysematous hyperinflation of the lungs. Subtle increase in interstitial prominence and airspace opacities since prior exam in the right middle lobe and subpleural left upper lobe may reflect sequela of mild bronchitic change or pneumonia. Normal sized heart. Aortic atherosclerosis without aneurysm. No confluent pneumonic consolidations. Minimal atelectasis at the lung bases. No overt pulmonary edema. No acute osseous abnormality. IMPRESSION: 1. Emphysematous hyperinflation of the lungs. 2. Subtle increase in airspace opacity and increased interstitial markings in the right middle lobe and subpleural left upper lobe may reflect sequela pneumonia and/or bronchitic change. 3. Aortic atherosclerosis. Electronically Signed   By: Tollie Eth M.D.   On: 01/21/2017 14:31    ASSESSMENT AND PLAN:   The patient was seen today by Dr Eldridge Dace, the patient evaluated and the data reviewed.   1. Elevated troponin - hx of CAD, but no ischemic or CHF sx prior to admit - mild elevation in enzymes most likely demand ischemia from acute illness - since no ongoing sx, do not see a need for ischemic eval, heparin, etc, unless EF is different from previous testing.  Otherwise, per IM.  Principal Problem:   CAP (community acquired pneumonia) Active Problems:   Essential hypertension, benign   CORONARY ATHEROSCLEROSIS NATIVE CORONARY ARTERY   Moderate persistent asthma without complication   COPD III/ hypercarbic   Acute respiratory failure with hypoxia [facility])   SignedLeanna Battles 01/22/2017 12:35 PM Beeper  [phone]  Co-Sign MD   I have examined the patient and reviewed assessment and plan and discussed with patient.  Agree with above as stated.  Minimal troponin elevation is likely demand ischemia.  Continue to cycle enzymes and echo pending.  No angina.  If echo unchanged, no further ischemic w/u planned.   Lance Muss

## 2017-01-22 NOTE — Progress Notes (Signed)
PROGRESS NOTE    [name]  ZOX:[id number] DOB: 1941/05/20 DOA: 01/21/2017 PCP: Sanda Linger, MD    Brief Narrative: [name] is a 76 y.o. male with history of CAD, hypertension, asthma presents to the ER with complaints of shortness of breath. Patient has been having shortness of breath over the last 1 week with productive cough. Denies any chest pain. Has been having subjective feeling of fever and chills. Patient was found to be hypoxic at home and was brought to the ER.  In the ER patient was found to be febrile and was requiring at least 3 L of oxygen. Chest x-ray shows infiltrate concerning for pneumonia. Chest patient was hypoxic patient is being admitted for IV antibiotics and further management of pneumonia.  Assessment & Plan:   Principal Problem:   CAP (community acquired pneumonia) Active Problems:   Essential hypertension, benign   CORONARY ATHEROSCLEROSIS NATIVE CORONARY ARTERY   Moderate persistent asthma without complication   COPD III/ hypercarbic   Acute respiratory failure with hypoxia (HCC)   1-Community Acquired Pneumonia; Continue with Levaquin day 2.  WBC trending down from 18 to 16.  Follow strep antigen and legionella.  Influenza negative.   Acute hypoxic Respiratory failure;  Secondary to PNA.  Continue with supplementary  oxygen.  Continue with nebulizer treatments.   AKI; Cr peak to 1.7.  In setting of hypovolemia, infection.  Improved.  Hold ARB.   Mild elevation of troponin;  Continue to cycle.  ECHO ordered.  Cardiology consulted, prior history of re stent stenosis.   Hypokalemia; resolved.   CAD  status post stenting  Continue with aspirin and BB.  Last Cath 2007.  HTN; Hold ARB due to mild increase cr on admission.  Continue with Bisoprolol.   History of asthma/COPD - continue when necessary nebulizer.  DVT prophylaxis: Lovenox Code Status: full code.  Family Communication:  Disposition Plan:  Remain  Inpatient for IV  antibiotics.  Consultants:   none   Procedures:   none   Antimicrobials:  Levaquin 3-27   Subjective: He denies chest pain. Breathing better   Objective: Vitals:   01/21/17 2000 01/21/17 2030 01/21/17 2114 01/22/17 0459  BP: 117/61  107/60 (!) 113/59  Pulse: 89  89 76  Resp: (!) 24  18 16   Temp:   98.4 F (36.9 C) 97.8 F (36.6 C)  TempSrc:    Oral  SpO2: 95% 95% 95% 95%  Weight:   73.5 kg (162 lb)   Height:   5\' 9"  (1.753 m)     Intake/Output Summary (Last 24 hours) at 01/22/17 0724 Last data filed at 01/21/17 2052  Gross per 24 hour  Intake             2350 ml  Output                0 ml  Net             2350 ml   Filed Weights   01/21/17 1312 01/21/17 1833 01/21/17 2114  Weight: 68.5 kg (151 lb) 68.5 kg (151 lb) 73.5 kg (162 lb)    Examination:  General exam: Appears calm and comfortable  Respiratory system: Clear to auscultation. Respiratory effort normal. Sporadic ronchus, wheezing.  Cardiovascular system: S1 & S2 heard, RRR. No JVD, murmurs, rubs, gallops or clicks. No pedal edema. Gastrointestinal system: Abdomen is nondistended, soft and nontender. No organomegaly or masses felt. Normal bowel sounds heard. Central nervous system: Alert and oriented.  No focal neurological deficits. Extremities: Symmetric 5 x 5 power. Skin: No rashes, lesions or ulcers Psychiatry: Judgement and insight appear normal. Mood & affect appropriate.     Data Reviewed: I have personally reviewed following labs and imaging studies  CBC:  Recent Labs Lab 01/21/17 1328 01/22/17 0038  WBC 18.4* 16.1*  HGB 14.2 13.3  HCT 42.8 40.3  MCV 96.2 96.6  PLT 251 234   Basic Metabolic Panel:  Recent Labs Lab 01/21/17 1328 01/21/17 1652 01/22/17 0038  NA 136  --  136  K 2.9*  --  3.5  CL 93*  --  99*  CO2 32  --  28  GLUCOSE 143*  --  202*  BUN 29*  --  22*  CREATININE 1.71*  --  1.32*  CALCIUM 9.2  --  8.5*  MG  --  2.0  --    GFR: Estimated Creatinine  Clearance: 46.9 mL/min (A) (by C-G formula based on SCr of 1.32 mg/dL (H)). Liver Function Tests: No results for input(s): AST, ALT, ALKPHOS, BILITOT, PROT, ALBUMIN in the last 168 hours. No results for input(s): LIPASE, AMYLASE in the last 168 hours. No results for input(s): AMMONIA in the last 168 hours. Coagulation Profile: No results for input(s): INR, PROTIME in the last 168 hours. Cardiac Enzymes:  Recent Labs Lab 01/22/17 0038  TROPONINI 0.06*   BNP (last 3 results) No results for input(s): PROBNP in the last 8760 hours. HbA1C: No results for input(s): HGBA1C in the last 72 hours. CBG: No results for input(s): GLUCAP in the last 168 hours. Lipid Profile: No results for input(s): CHOL, HDL, LDLCALC, TRIG, CHOLHDL, LDLDIRECT in the last 72 hours. Thyroid Function Tests: No results for input(s): TSH, T4TOTAL, FREET4, T3FREE, THYROIDAB in the last 72 hours. Anemia Panel: No results for input(s): VITAMINB12, FOLATE, FERRITIN, TIBC, IRON, RETICCTPCT in the last 72 hours. Sepsis Labs:  Recent Labs Lab 01/21/17 1709  LATICACIDVEN 1.35    No results found for this or any previous visit (from the past 240 hour(s)).       Radiology Studies: Dg Chest 2 View  Result Date: 01/21/2017 CLINICAL DATA:  Dyspnea and generalized weakness x1 week EXAM: CHEST  2 VIEW COMPARISON:  10/15/2016 CXR FINDINGS: Emphysematous hyperinflation of the lungs. Subtle increase in interstitial prominence and airspace opacities since prior exam in the right middle lobe and subpleural left upper lobe may reflect sequela of mild bronchitic change or pneumonia. Normal sized heart. Aortic atherosclerosis without aneurysm. No confluent pneumonic consolidations. Minimal atelectasis at the lung bases. No overt pulmonary edema. No acute osseous abnormality. IMPRESSION: 1. Emphysematous hyperinflation of the lungs. 2. Subtle increase in airspace opacity and increased interstitial markings in the right middle lobe  and subpleural left upper lobe may reflect sequela pneumonia and/or bronchitic change. 3. Aortic atherosclerosis. Electronically Signed   By: Tollie Eth M.D.   On: 01/21/2017 14:31        Scheduled Meds: . aspirin EC  81 mg Oral Daily  . bisoprolol  5 mg Oral Daily  . enoxaparin (LOVENOX) injection  40 mg Subcutaneous Q24H  . ipratropium-albuterol  3 mL Nebulization QID  . levofloxacin (LEVAQUIN) IV  500 mg Intravenous Q24H  . mouth rinse  15 mL Mouth Rinse BID  . multivitamin with minerals  1 tablet Oral Daily  . rosuvastatin  40 mg Oral QPM   Continuous Infusions: . sodium chloride 75 mL/hr at 01/21/17 2330     LOS: 1 day  Time spent: 35 minutes     Alba Cory, MD [facility] Pager [phone]  If 7PM-7AM, please contact night-coverage [email or web address] Password [facility] 01/22/2017, 7:24 AM

## 2017-01-22 NOTE — Progress Notes (Signed)
CRITICAL VALUE ALERT  Critical value received:  Troponin 0.06  Date of notification:  01/22/17  Time of notification:  0200  Critical value read back:Yes.    Nurse who received alert:  Tama High  MD notified (1st page):  Kak[place],MD  Time of first page:  0203  Responding MD:  Julious Oka  No new orders at this time.Will continue to monitor and treat per MD orders.

## 2017-01-23 ENCOUNTER — Inpatient Hospital Stay (HOSPITAL_COMMUNITY): Payer: Medicare Other

## 2017-01-23 DIAGNOSIS — I248 Other forms of acute ischemic heart disease: Secondary | ICD-10-CM

## 2017-01-23 DIAGNOSIS — I5030 Unspecified diastolic (congestive) heart failure: Secondary | ICD-10-CM

## 2017-01-23 LAB — BASIC METABOLIC PANEL
Anion gap: 8 (ref 5–15)
BUN: 23 mg/dL — AB (ref 6–20)
CHLORIDE: 98 mmol/L — AB (ref 101–111)
CO2: 29 mmol/L (ref 22–32)
Calcium: 8.8 mg/dL — ABNORMAL LOW (ref 8.9–10.3)
Creatinine, Ser: 1.21 mg/dL (ref 0.61–1.24)
GFR calc Af Amer: >60 mL/min (ref >60)
GFR calc non Af Amer: 56 mL/min — ABNORMAL LOW (ref >60)
Glucose, Bld: 120 mg/dL — ABNORMAL HIGH (ref 65–99)
Potassium: 3.3 mmol/L — ABNORMAL LOW (ref 3.5–5.1)
SODIUM: 135 mmol/L (ref 135–145)

## 2017-01-23 LAB — CBC
HCT: 40.1 % (ref 39.0–52.0)
HEMOGLOBIN: 13.4 g/dL (ref 13.0–17.0)
MCH: 31.8 pg (ref 26.0–34.0)
MCHC: 33.4 g/dL (ref 30.0–36.0)
MCV: 95 fL (ref 78.0–100.0)
Platelets: 269 10*3/uL (ref 150–400)
RBC: 4.22 MIL/uL (ref 4.22–5.81)
RDW: 14.3 % (ref 11.5–15.5)
WBC: 17.3 10*3/uL — ABNORMAL HIGH (ref 4.0–10.5)

## 2017-01-23 LAB — LEGIONELLA PNEUMOPHILA SEROGP 1 UR AG: L. PNEUMOPHILA SEROGP 1 UR AG: NEGATIVE

## 2017-01-23 LAB — ECHOCARDIOGRAM COMPLETE
HEIGHTINCHES: 69 in
Weight: 2592 oz

## 2017-01-23 MED ORDER — POTASSIUM CHLORIDE CRYS ER 20 MEQ PO TBCR
40.0000 meq | EXTENDED_RELEASE_TABLET | Freq: Two times a day (BID) | ORAL | Status: AC
  Administered 2017-01-23 (×2): 40 meq via ORAL
  Filled 2017-01-23 (×2): qty 2

## 2017-01-23 NOTE — Progress Notes (Signed)
Received call from tele monitor that patient had run of bigeminy @ 1000. Checked on patient stated he didn't feel abnormal at that time. Notified Dr. Sunnie Nielsen will continue to monitor patient.

## 2017-01-23 NOTE — Progress Notes (Signed)
Progress Note  Patient Name: [name] Date of Encounter: 01/23/2017  Primary Cardiologist: Dr Jens Som  Patient Profile     76 y.o. male w/ hx  RCA stent 2002, cath 2007 w/ 50% LAD, RCA stent 20% ISR, EF 65%. MV 2014 w/ no ischemia & EF 74%. R-ICA 100%, L-ICA <60% 11/2016, HTN, HLD, SEM, asthma. Pt admitted 03/27 w/ SOB, PNA, hypoxia. Cards asked to see for elevated troponin and abnl ECG  Subjective   Not on O2 at home, breathing better but wheezing. Was wheezing at home before he got PNA.   Inpatient Medications    Scheduled Meds: . aspirin EC  81 mg Oral Daily  . bisoprolol  5 mg Oral Daily  . enoxaparin (LOVENOX) injection  40 mg Subcutaneous Q24H  . ipratropium-albuterol  3 mL Nebulization TID  . levofloxacin (LEVAQUIN) IV  500 mg Intravenous Q24H  . mouth rinse  15 mL Mouth Rinse BID  . multivitamin with minerals  1 tablet Oral Daily  . rosuvastatin  40 mg Oral QPM   Continuous Infusions:  PRN Meds: acetaminophen **OR** acetaminophen, ipratropium-albuterol, ondansetron **OR** ondansetron (ZOFRAN) IV   Vital Signs    Vitals:   01/22/17 2038 01/22/17 2116 01/23/17 0552 01/23/17 1023  BP:  (!) 107/52 (!) 132/47   Pulse:  87 87   Resp:  18 18   Temp:  98.5 F (36.9 C) 98.3 F (36.8 C)   TempSrc:      SpO2: 94% 90% 94% 90%  Weight:      Height:        Intake/Output Summary (Last 24 hours) at 01/23/17 1038 Last data filed at 01/23/17 0912  Gross per 24 hour  Intake                0 ml  Output              760 ml  Net             -760 ml   Filed Weights   01/21/17 1312 01/21/17 1833 01/21/17 2114  Weight: 151 lb (68.5 kg) 151 lb (68.5 kg) 162 lb (73.5 kg)    Telemetry    SR, frequent PVCs and some bigeminy - Personally Reviewed  ECG    n/a - Personally Reviewed  Physical Exam   General: Well developed, well nourished, male appearing in no acute distress. Head: Normocephalic, atraumatic.  Neck: Supple without bruits, JVD minimal  elevation. Lungs:  Resp regular and unlabored, wheezes and some rales. Heart: RRR, S1, S2, no S3, S4, soft murmur; no rub. Abdomen: Soft, non-tender, non-distended with normoactive bowel sounds. No hepatomegaly. No rebound/guarding. No obvious abdominal masses. Extremities: No clubbing, cyanosis, no edema. Distal pedal pulses are 2+ bilaterally. Neuro: Alert and oriented X 3. Moves all extremities spontaneously. Psych: Normal affect.  Labs    Hematology Recent Labs Lab 01/21/17 1328 01/22/17 0038 01/23/17 0558  WBC 18.4* 16.1* 17.3*  RBC 4.45 4.17* 4.22  HGB 14.2 13.3 13.4  HCT 42.8 40.3 40.1  MCV 96.2 96.6 95.0  MCH 31.9 31.9 31.8  MCHC 33.2 33.0 33.4  RDW 14.5 14.5 14.3  PLT 251 234 269    Chemistry Recent Labs Lab 01/21/17 1328 01/22/17 0038 01/23/17 0558  NA 136 136 135  K 2.9* 3.5 3.3*  CL 93* 99* 98*  CO2 32 28 29  GLUCOSE 143* 202* 120*  BUN 29* 22* 23*  CREATININE 1.71* 1.32* 1.21  CALCIUM 9.2 8.5* 8.8*  GFRNONAA 37* 50* 56*  GFRAA 43* 58* >60  ANIONGAP 11 9 8      Cardiac Enzymes Recent Labs Lab 01/22/17 0038 01/22/17 0640 01/22/17 1201 01/22/17 1804  TROPONINI 0.06* 0.18* <0.03 0.03*    Recent Labs Lab 01/21/17 1334 01/21/17 1701  TROPIPOC 0.00 0.02     Radiology    Dg Chest 2 View  Result Date: 01/21/2017 CLINICAL DATA:  Dyspnea and generalized weakness x1 week EXAM: CHEST  2 VIEW COMPARISON:  10/15/2016 CXR FINDINGS: Emphysematous hyperinflation of the lungs. Subtle increase in interstitial prominence and airspace opacities since prior exam in the right middle lobe and subpleural left upper lobe may reflect sequela of mild bronchitic change or pneumonia. Normal sized heart. Aortic atherosclerosis without aneurysm. No confluent pneumonic consolidations. Minimal atelectasis at the lung bases. No overt pulmonary edema. No acute osseous abnormality. IMPRESSION: 1. Emphysematous hyperinflation of the lungs. 2. Subtle increase in airspace opacity  and increased interstitial markings in the right middle lobe and subpleural left upper lobe may reflect sequela pneumonia and/or bronchitic change. 3. Aortic atherosclerosis. Electronically Signed   By: Tollie Eth M.D.   On: 01/21/2017 14:31     Cardiac Studies   ECHO: 01/23/2017  ordered  Patient Profile     76 y.o. male w/ hx  RCA stent 2002, cath 2007 w/ 50% LAD, RCA stent 20% ISR, EF 65%. MV 2014 w/ no ischemia & EF 74%. R-ICA 100%, L-ICA <60% 11/2016, HTN, HLD, SEM, asthma. Pt admitted 03/27 w/ SOB, PNA, hypoxia. Cards asked to see for elevated troponin and abnl ECG.  Assessment & Plan    1. Elevated troponin - hx of CAD, but no ischemic or CHF sx prior to admit - mild elevation in enzymes most likely demand ischemia from acute illness - since no ongoing sx, do not see a need for ischemic eval, heparin, etc, unless EF is different from previous testing. - Echo ordered, f/u on results  Otherwise, per IM.  Principal Problem:   CAP (community acquired pneumonia) Active Problems:   Essential hypertension, benign   CORONARY ATHEROSCLEROSIS NATIVE CORONARY ARTERY   Moderate persistent asthma without complication   COPD III/ hypercarbic   Acute respiratory failure with hypoxia [facility])  Signed, Theodore Demark , PA-C 10:38 AM 01/23/2017 Pager: [phone]  Patient seen, examined. Available data reviewed. Agree with findings, assessment, and plan as outlined by Theodore Demark, PA-C. Exam reveals an elderly, alert, oriented male in NAD. Lungs with diminished breath sounds throughout, heart RRR, distant, abdomen soft, NT, extremities without edema. Data reviewed. Echo shows normal LV function. Troponin minimally elevated in setting of pneumonia/respiratory problems. No signs of ACS. No further cardiac evaluation or treatment indicated. Please call if we can be of any assistance. thanks  Tonny Bollman, M.D. 01/23/2017 5:26 PM

## 2017-01-23 NOTE — Care Management Note (Signed)
Case Management Note  Patient Details  Name: [name] MRN: [id number] Date of Birth: Sep 26, 1941  Subjective/Objective:      Admitted with CAP, hx of CAD, hypertension, asthma. From home with wife, Gigi Gin.     Vin Goerlitz (Spouse)      [phone]      PCP: Sanda Linger  Action/Plan: Plan to return to home when medically stable. CM to f/u with d/c needs.  Expected Discharge Date:                  Expected Discharge Plan:  Home/Self Care  In-House Referral:     Discharge planning Services  CM Consult  Post Acute Care Choice:    Choice offered to:     DME Arranged:    DME Agency:     HH Arranged:    HH Agency:     Status of Service:  In process, will continue to follow  If discussed at Long Length of Stay Meetings, dates discussed:    Additional Comments:  Epifanio Lesches, RN 01/23/2017, 8:20 PM

## 2017-01-23 NOTE — Progress Notes (Addendum)
PROGRESS NOTE    [name]  ZOX:[id number] DOB: [birth year] DOA: 01/21/2017 PCP: Sanda Linger, MD    Brief Narrative: [name] is a 76 y.o. male with history of CAD, hypertension, asthma presents to the ER with complaints of shortness of breath. Patient has been having shortness of breath over the last 1 week with productive cough. Denies any chest pain. Has been having subjective feeling of fever and chills. Patient was found to be hypoxic at home and was brought to the ER.  In the ER patient was found to be febrile and was requiring at least 3 L of oxygen. Chest x-ray shows infiltrate concerning for pneumonia. Chest patient was hypoxic patient is being admitted for IV antibiotics and further management of pneumonia.  Assessment & Plan:   Principal Problem:   CAP (community acquired pneumonia) Active Problems:   Essential hypertension, benign   CORONARY ATHEROSCLEROSIS NATIVE CORONARY ARTERY   Moderate persistent asthma without complication   COPD III/ hypercarbic   Acute respiratory failure with hypoxia (HCC)   1-Community Acquired Pneumonia; Continue with Levaquin day 3.  WBC trends 18 to 16--17 strep antigen and legionella negative Influenza negative.  Follow WBC in am , if lower plan to discharge patient.   Acute hypoxic Respiratory failure;  Secondary to PNA.  Continue with supplementary  oxygen.  Continue with nebulizer treatments.   AKI; Cr peak to 1.7.  In setting of hypovolemia, infection.  Improved.  Hold ARB.   Mild elevation of troponin;  ECHO ordered.  Cardiology consulted, prior history of re stent stenosis.  Likely demand from PNA  Hypokalemia; replace with 40 meq times 2.   CAD  status post stenting  Continue with aspirin and BB.  Last Cath 2007.  HTN; Hold ARB due to mild increase cr on admission.  Continue with Bisoprolol.   History of asthma/COPD - continue when necessary nebulizer.  DVT prophylaxis: Lovenox Code Status: full code.    Family Communication:  Disposition Plan:  Remain  Inpatient for IV antibiotics.  Consultants:   none   Procedures:   none   Antimicrobials:  Levaquin 3-27   Subjective: Feeling ok, cough persist. Dyspnea stable.    Objective: Vitals:   01/22/17 2116 01/23/17 0552 01/23/17 1023 01/23/17 1401  BP: (!) 107/52 (!) 132/47  121/71  Pulse: 87 87  86  Resp: 18 18  18   Temp: 98.5 F (36.9 C) 98.3 F (36.8 C)  98.2 F (36.8 C)  TempSrc:    Oral  SpO2: 90% 94% 90% 94%  Weight:      Height:        Intake/Output Summary (Last 24 hours) at 01/23/17 1459 Last data filed at 01/23/17 0912  Gross per 24 hour  Intake                0 ml  Output              460 ml  Net             -460 ml   Filed Weights   01/21/17 1312 01/21/17 1833 01/21/17 2114  Weight: 68.5 kg (151 lb) 68.5 kg (151 lb) 73.5 kg (162 lb)    Examination:  General exam: Appears calm and comfortable  Respiratory system: Clear to auscultation. Respiratory effort normal. Sporadic ronchus, sporadic wheezing left side. .  Cardiovascular system: S1 & S2 heard, RRR. No JVD, murmurs, rubs, gallops or clicks. No pedal edema. Gastrointestinal system: Abdomen is nondistended, soft  and nontender. No organomegaly or masses felt. Normal bowel sounds heard. Central nervous system: Alert and oriented. No focal neurological deficits. Extremities: Symmetric 5 x 5 power. Skin: No rashes, lesions or ulcers Psychiatry: Judgement and insight appear normal. Mood & affect appropriate.     Data Reviewed: I have personally reviewed following labs and imaging studies  CBC:  Recent Labs Lab 01/21/17 1328 01/22/17 0038 01/23/17 0558  WBC 18.4* 16.1* 17.3*  HGB 14.2 13.3 13.4  HCT 42.8 40.3 40.1  MCV 96.2 96.6 95.0  PLT 251 234 269   Basic Metabolic Panel:  Recent Labs Lab 01/21/17 1328 01/21/17 1652 01/22/17 0038 01/23/17 0558  NA 136  --  136 135  K 2.9*  --  3.5 3.3*  CL 93*  --  99* 98*  CO2 32  --  28 29   GLUCOSE 143*  --  202* 120*  BUN 29*  --  22* 23*  CREATININE 1.71*  --  1.32* 1.21  CALCIUM 9.2  --  8.5* 8.8*  MG  --  2.0  --   --    GFR: Estimated Creatinine Clearance: 51.1 mL/min (by C-G formula based on SCr of 1.21 mg/dL). Liver Function Tests: No results for input(s): AST, ALT, ALKPHOS, BILITOT, PROT, ALBUMIN in the last 168 hours. No results for input(s): LIPASE, AMYLASE in the last 168 hours. No results for input(s): AMMONIA in the last 168 hours. Coagulation Profile: No results for input(s): INR, PROTIME in the last 168 hours. Cardiac Enzymes:  Recent Labs Lab 01/22/17 0038 01/22/17 0640 01/22/17 1201 01/22/17 1804  TROPONINI 0.06* 0.18* <0.03 0.03*   BNP (last 3 results) No results for input(s): PROBNP in the last 8760 hours. HbA1C: No results for input(s): HGBA1C in the last 72 hours. CBG: No results for input(s): GLUCAP in the last 168 hours. Lipid Profile: No results for input(s): CHOL, HDL, LDLCALC, TRIG, CHOLHDL, LDLDIRECT in the last 72 hours. Thyroid Function Tests: No results for input(s): TSH, T4TOTAL, FREET4, T3FREE, THYROIDAB in the last 72 hours. Anemia Panel: No results for input(s): VITAMINB12, FOLATE, FERRITIN, TIBC, IRON, RETICCTPCT in the last 72 hours. Sepsis Labs:  Recent Labs Lab 01/21/17 1709  LATICACIDVEN 1.35    Recent Results (from the past 240 hour(s))  Blood Culture (routine x 2)     Status: None (Preliminary result)   Collection Time: 01/21/17  6:21 PM  Result Value Ref Range Status   Specimen Description BLOOD RIGHT ANTECUBITAL  Final   Special Requests BOTTLES DRAWN AEROBIC AND ANAEROBIC 10CC  Final   Culture NO GROWTH < 24 HOURS  Final   Report Status PENDING  Incomplete  Blood Culture (routine x 2)     Status: None (Preliminary result)   Collection Time: 01/21/17  6:27 PM  Result Value Ref Range Status   Specimen Description BLOOD RIGHT FOREARM  Final   Special Requests NONE  Final   Culture NO GROWTH < 24 HOURS   Final   Report Status PENDING  Incomplete         Radiology Studies: Dg Chest 2 View  Result Date: 01/23/2017 CLINICAL DATA:  Recent pneumonia EXAM: CHEST  2 VIEW COMPARISON:  January 21, 2017 FINDINGS: There is focal consolidation in the left base with small left pleural effusion. There is slight scarring in the right base. There is mild fibrotic change in the lung bases as well. Lungs elsewhere clear. Heart size and pulmonary vascularity are normal. No adenopathy. No bone lesions. IMPRESSION: Small area  of left base consolidation with small left pleural effusion. Fibrotic change is noted in the lung bases. Stable cardiac silhouette. Electronically Signed   By: Bretta Bang III M.D.   On: 01/23/2017 13:14        Scheduled Meds: . aspirin EC  81 mg Oral Daily  . bisoprolol  5 mg Oral Daily  . enoxaparin (LOVENOX) injection  40 mg Subcutaneous Q24H  . ipratropium-albuterol  3 mL Nebulization TID  . levofloxacin (LEVAQUIN) IV  500 mg Intravenous Q24H  . mouth rinse  15 mL Mouth Rinse BID  . multivitamin with minerals  1 tablet Oral Daily  . potassium chloride  40 mEq Oral BID  . rosuvastatin  40 mg Oral QPM   Continuous Infusions:    LOS: 2 days    Time spent: 35 minutes     Alba Cory, MD [facility] Pager [phone]  If 7PM-7AM, please contact night-coverage [email or web address] Password [facility] 01/23/2017, 2:59 PM

## 2017-01-23 NOTE — Progress Notes (Signed)
2D Echocardiogram has been performed.  [name] 01/23/2017, 11:48 AM

## 2017-01-24 LAB — BASIC METABOLIC PANEL
Anion gap: 9 (ref 5–15)
BUN: 13 mg/dL (ref 6–20)
CO2: 30 mmol/L (ref 22–32)
CREATININE: 1.02 mg/dL (ref 0.61–1.24)
Calcium: 8.8 mg/dL — ABNORMAL LOW (ref 8.9–10.3)
Chloride: 97 mmol/L — ABNORMAL LOW (ref 101–111)
GFR calc Af Amer: >60 mL/min (ref >60)
GFR calc non Af Amer: >60 mL/min (ref >60)
Glucose, Bld: 117 mg/dL — ABNORMAL HIGH (ref 65–99)
POTASSIUM: 3.7 mmol/L (ref 3.5–5.1)
SODIUM: 136 mmol/L (ref 135–145)

## 2017-01-24 LAB — CBC
HCT: 40 % (ref 39.0–52.0)
Hemoglobin: 13.3 g/dL (ref 13.0–17.0)
MCH: 31.3 pg (ref 26.0–34.0)
MCHC: 33.3 g/dL (ref 30.0–36.0)
MCV: 94.1 fL (ref 78.0–100.0)
PLATELETS: 254 10*3/uL (ref 150–400)
RBC: 4.25 MIL/uL (ref 4.22–5.81)
RDW: 14 % (ref 11.5–15.5)
WBC: 14 10*3/uL — ABNORMAL HIGH (ref 4.0–10.5)

## 2017-01-24 MED ORDER — ALBUTEROL SULFATE (2.5 MG/3ML) 0.083% IN NEBU: 2.5000 mg | INHALATION_SOLUTION | Freq: Four times a day (QID) | RESPIRATORY_TRACT | 12 refills | Status: DC | PRN

## 2017-01-24 MED ORDER — LEVOFLOXACIN 500 MG PO TABS: 500.0000 mg | ORAL_TABLET | Freq: Every day | ORAL | 0 refills | Status: DC

## 2017-01-24 MED ORDER — ASPIRIN 81 MG PO TBEC: 81.0000 mg | DELAYED_RELEASE_TABLET | Freq: Every day | ORAL | 0 refills | Status: DC

## 2017-01-24 NOTE — Progress Notes (Signed)
SATURATION QUALIFICATIONS: (This note is used to comply with regulatory documentation for home oxygen)  Patient Saturations on Room Air at Rest = 89%  Patient Saturations on Room Air while Ambulating = 87%  Patient Saturations on 1 Liters of oxygen while Ambulating = 90%  Please briefly explain why patient needs home oxygen:

## 2017-01-24 NOTE — Evaluation (Signed)
Occupational Therapy Evaluation and Discharge  Patient Details Name: CALHOUN TROXELL MRN: [id number] DOB: [birth year] Today's Date: 01/24/2017    History of Present Illness 76 y.o. male with history of CAD, hypertension, asthma presents to the ER with complaints of shortness of breath.   Clinical Impression   Pt reports he was independent with ADL PTA. Currently pt overall mod I with ADL and functional mobility. SpO2=80% on RA with activity; educated on pursed lip breathing with rise in SpO2 to low 90s, reapplied 2L supplemental O2. Pt planning to d/c home with 24/7 supervision from family. No further acute OT needs identified; signing off at this time. Please re-consult if needs change. Thank you for this referral.    Follow Up Recommendations  No OT follow up;Supervision - Intermittent    Equipment Recommendations  None recommended by OT    Recommendations for Other Services PT consult     Precautions / Restrictions Precautions Precautions: None Restrictions Weight Bearing Restrictions: No      Mobility Bed Mobility Overal bed mobility: Independent                Transfers Overall transfer level: Modified independent Equipment used: None             General transfer comment: No unsteadiness or LOB    Balance Overall balance assessment: No apparent balance deficits (not formally assessed)                                         ADL either performed or assessed with clinical judgement   ADL Overall ADL's : Modified independent                                       General ADL Comments: Pt able to complete transfers and ADL at mod I. SpO2 80% on RA following activity, back up to low 90s with pursed lip breathing. Reapplied 2L supplemental O2.      Vision         Perception     Praxis      Pertinent Vitals/Pain Pain Assessment: No/denies pain     Hand Dominance     Extremity/Trunk Assessment Upper Extremity  Assessment Upper Extremity Assessment: Overall WFL for tasks assessed   Lower Extremity Assessment Lower Extremity Assessment: Defer to PT evaluation   Cervical / Trunk Assessment Cervical / Trunk Assessment: Normal   Communication Communication Communication: No difficulties   Cognition Arousal/Alertness: Awake/alert Behavior During Therapy: WFL for tasks assessed/performed Overall Cognitive Status: Within Functional Limits for tasks assessed                                     General Comments       Exercises     Shoulder Instructions      Home Living Family/patient expects to be discharged to:: Private residence Living Arrangements: Spouse/significant other Available Help at Discharge: Family;Available 24 hours/day Type of Home: House Home Access: Stairs to enter [employer] of Steps: 3   Home Layout: One level     Bathroom Shower/Tub: Producer, television/film/video: Standard     Home Equipment: None          Prior Functioning/Environment Level of Independence:  Independent        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 [place]        OT Problem List:        OT Treatment/Interventions:      OT Goals(Current goals can be found in the care plan section) Acute Rehab OT Goals Patient Stated Goal: return home OT Goal Formulation: All assessment and education complete, DC therapy  OT Frequency:     Barriers to D/C:            Co-evaluation              End of Session Equipment Utilized During Treatment: Oxygen  Activity Tolerance: Patient tolerated treatment well Patient left: in bed;with call bell/phone within reach;with family/visitor present  OT Visit Diagnosis: Muscle weakness (generalized) (M62.81)                Time: 1610-9604 OT Time Calculation (min): 17 min Charges:  OT General Charges $OT Visit: 1 Procedure OT Evaluation $OT Eval Moderate Complexity: 1 Procedure G-Codes:      Khaliya Golinski A. Brett Albino, M.S., OTR/L Pager: [phone]  Gaye Alken 01/24/2017, 9:42 AM

## 2017-01-24 NOTE — Evaluation (Signed)
Physical Therapy Evaluation Patient Details Name: [name] MRN: [id number] DOB: 1941/02/25 Today's Date: 01/24/2017   History of Present Illness  76 y.o. male with history of CAD, hypertension, asthma presents to the ER with complaints of shortness of breath.  Clinical Impression  Patient is functioning at independent level with mobility and gait.  Supervision on stairs for safety.  No dyspnea per patient, but O2 sats dropped to 87% following gait.  Encouraged patient to continue to walk/move once home.  No further PT needs - PT will sign off.    Follow Up Recommendations No PT follow up    Equipment Recommendations  None recommended by PT    Recommendations for Other Services       Precautions / Restrictions Precautions Precautions: None Restrictions Weight Bearing Restrictions: No      Mobility  Bed Mobility Overal bed mobility: Independent                Transfers Overall transfer level: Independent Equipment used: None             General transfer comment: No assist needed.  Good balance in stance.  Ambulation/Gait Ambulation/Gait assistance: Independent Ambulation Distance (Feet): 200 Feet Assistive device: None Gait Pattern/deviations: Step-through pattern;Decreased stride length   Gait velocity interpretation: at or above normal speed for age/gender General Gait Details: Good balance during gait.  No assist needed.  Ambulated on room air.  O2 sats 89-90 during gait, but dropped to 87% once sitting EOB.  Increased to 90% with deep breathing.  Reapplied O2.  Stairs Stairs: Yes Stairs assistance: Supervision Stair Management: One rail Right;Alternating pattern;Forwards Number of Stairs: 3 General stair comments: Supervision for safety only  Wheelchair Mobility    Modified Rankin (Stroke Patients Only)       Balance Overall balance assessment: No apparent balance deficits (not formally assessed)                            High level balance activites: Direction changes;Turns;Head turns High Level Balance Comments: No loss of balance with above high level balance activities             Pertinent Vitals/Pain Pain Assessment: No/denies pain    Home Living Family/patient expects to be discharged to:: Private residence Living Arrangements: Spouse/significant other Available Help at Discharge: Family;Available 24 hours/day Type of Home: House Home Access: Stairs to enter Entrance Stairs-Rails: Doctor, general practice of Steps: 3 Home Layout: One level Home Equipment: None      Prior Function Level of Independence: Independent         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 [place]        Extremity/Trunk Assessment   Upper Extremity Assessment Upper Extremity Assessment: Overall WFL for tasks assessed    Lower Extremity Assessment Lower Extremity Assessment: Overall WFL for tasks assessed    Cervical / Trunk Assessment Cervical / Trunk Assessment: Normal  Communication   Communication: No difficulties  Cognition Arousal/Alertness: Awake/alert Behavior During Therapy: WFL for tasks assessed/performed Overall Cognitive Status: Within Functional Limits for tasks assessed                                        General Comments      Exercises     Assessment/Plan    PT Assessment  Patent does not need any further PT services  PT Problem List         PT Treatment Interventions      PT Goals (Current goals can be found in the Care Plan section)  Acute Rehab PT Goals Patient Stated Goal: return home PT Goal Formulation: All assessment and education complete, DC therapy    Frequency     Barriers to discharge        Co-evaluation               End of Session   Activity Tolerance: Patient tolerated treatment well Patient left: in bed;with call bell/phone within reach (sitting EOB) Nurse  Communication: Mobility status (O2 sats) PT Visit Diagnosis: Difficulty in walking, not elsewhere classified (R26.2)    Time: 6578-4696 PT Time Calculation (min) (ACUTE ONLY): 13 min   Charges:   PT Evaluation $PT Eval Moderate Complexity: 1 Procedure     PT G Codes:        Durenda Hurt. Renaldo Fiddler, [facility] Acute Rehab Services Pager [phone]   Vena Austria 01/24/2017, 11:53 AM

## 2017-01-24 NOTE — Discharge Summary (Signed)
Physician Discharge Summary  [name]:[id number] DOB: 05-20-1941 DOA: 01/21/2017  PCP: Sanda Linger, MD  Admit date: 01/21/2017 Discharge date: 01/24/2017  Admitted From: Home  Disposition:  Home   Recommendations for Outpatient Follow-up:  1. Follow up with PCP in 1-2 weeks 2. Please obtain BMP/CBC in one week 3. Discharge on oxygen. Reassess need for oxygen.    Equipment/Devices: oxygen 2 l   Discharge Condition: stable.  CODE STATUS: Full code.  Diet recommendation: Heart Healthy   Brief/Interim Summary: Devin Kocak Myersis a 76 y.o.malewith history of CAD, hypertension, asthma presents to the ER with complaints of shortness of breath. Patient has been having shortness of breath over the last 1 week with productive cough. Denies any chest pain. Has been having subjective feeling of fever and chills. Patient was found to be hypoxic at home and was brought to the ER. In the ER patient was found to be febrile and was requiring at least 3 L of oxygen. Chest x-ray shows infiltrate concerning for pneumonia. Chest patient was hypoxic patient is being admitted for IV antibiotics and further management of pneumonia.  Assessment & Plan:   Principal Problem:   CAP (community acquired pneumonia) Active Problems:   Essential hypertension, benign   CORONARY ATHEROSCLEROSIS NATIVE CORONARY ARTERY   Moderate persistent asthma without complication   COPD III/ hypercarbic   Acute respiratory failure with hypoxia (HCC)   1-Community Acquired Pneumonia; Continue with Levaquin day 3  WBC trends 18 to 16--17---14 strep antigen and legionella negative Influenza negative.  Wbc trending down, patient stable to be discharge on Levaquin 4 more days antibiotics.   Acute hypoxic Respiratory failure;  Secondary to PNA. Stable to be discharge  Continue with supplementary  oxygen.  Continue with nebulizer treatments.  discharge on home oxygen.   AKI; Cr peak to 1.7.  In setting of  hypovolemia, infection.  Improved.  Hold ARB at discharge.   Mild elevation of troponin;  ECHO normal EF Cardiology consulted, prior history of re stent stenosis.  Likely demand from PNA  Hypokalemia;resolved.   CAD status post stenting  Continue with aspirin and BB.  Last Cath 2007.  HTN; Hold ARB due to mild increase cr on admission.  Continue with Bisoprolol.   History of asthma/COPD - continue when necessary nebulizer.   Discharge Diagnoses:  Principal Problem:   CAP (community acquired pneumonia) Active Problems:   Essential hypertension, benign   CORONARY ATHEROSCLEROSIS NATIVE CORONARY ARTERY   Moderate persistent asthma without complication   COPD III/ hypercarbic   Acute respiratory failure with hypoxia (HCC)   Demand ischemia of myocardium [facility])    Discharge Instructions  Discharge Instructions    Diet - low sodium heart healthy    Complete by:  As directed    Increase activity slowly    Complete by:  As directed      Allergies as of 01/24/2017      Reactions   Penicillins Hives, Swelling   Has patient had a PCN reaction causing immediate rash, facial/tongue/throat swelling, SOB or lightheadedness with hypotension: Yes Has patient had a PCN reaction causing severe rash involving mucus membranes or skin necrosis: Yes Has patient had a PCN reaction that required hospitalization No Has patient had a PCN reaction occurring within the last 10 years: No If all of the above answers are "NO", then may proceed with Cephalosporin use.      Medication List    STOP taking these medications   hydrochlorothiazide 12.5 MG capsule  Commonly known as:  MICROZIDE   irbesartan 300 MG tablet Commonly known as:  AVAPRO     TAKE these medications   albuterol (2.5 MG/3ML) 0.083% nebulizer solution Commonly known as:  PROVENTIL Take 3 mLs (2.5 mg total) by nebulization every 6 (six) hours as needed for wheezing or shortness of breath.   aspirin 81 MG EC  tablet Take 1 tablet (81 mg total) by mouth daily. Start taking on:  01/25/2017   bisoprolol 5 MG tablet Commonly known as:  ZEBETA Take 1 tablet (5 mg total) by mouth daily.   levofloxacin 500 MG tablet Commonly known as:  LEVAQUIN Take 1 tablet (500 mg total) by mouth daily.   multivitamin tablet Take 1 tablet by mouth daily.   rosuvastatin 40 MG tablet Commonly known as:  CRESTOR Take 1 tablet (40 mg total) by mouth daily. What changed:  when to take this   Tiotropium Bromide-Olodaterol 2.5-2.5 MCG/ACT Aers Commonly known as:  STIOLTO RESPIMAT Inhale 2 puffs into the lungs daily. What changed:  how much to take            Durable Medical Equipment        Start     Ordered   01/24/17 1119  For home use only DME Nebulizer machine  Once    Question:  Patient needs a nebulizer to treat with the following condition  Answer:  Bronchospasm   01/24/17 1118   01/24/17 1119  For home use only DME oxygen  Once    Question Answer Comment  Liters per Minute 2   Frequency Continuous (stationary and portable oxygen unit needed)   Oxygen conserving device Yes   Oxygen delivery system Gas      01/24/17 1118      Allergies  Allergen Reactions  . Penicillins Hives and Swelling    Has patient had a PCN reaction causing immediate rash, facial/tongue/throat swelling, SOB or lightheadedness with hypotension: Yes Has patient had a PCN reaction causing severe rash involving mucus membranes or skin necrosis: Yes Has patient had a PCN reaction that required hospitalization No Has patient had a PCN reaction occurring within the last 10 years: No If all of the above answers are "NO", then may proceed with Cephalosporin use.     Consultations:  Cardiology    Procedures/Studies: Dg Chest 2 View  Result Date: 01/23/2017 CLINICAL DATA:  Recent pneumonia EXAM: CHEST  2 VIEW COMPARISON:  January 21, 2017 FINDINGS: There is focal consolidation in the left base with small left pleural  effusion. There is slight scarring in the right base. There is mild fibrotic change in the lung bases as well. Lungs elsewhere clear. Heart size and pulmonary vascularity are normal. No adenopathy. No bone lesions. IMPRESSION: Small area of left base consolidation with small left pleural effusion. Fibrotic change is noted in the lung bases. Stable cardiac silhouette. Electronically Signed   By: Bretta Bang III M.D.   On: 01/23/2017 13:14   Dg Chest 2 View  Result Date: 01/21/2017 CLINICAL DATA:  Dyspnea and generalized weakness x1 week EXAM: CHEST  2 VIEW COMPARISON:  10/15/2016 CXR FINDINGS: Emphysematous hyperinflation of the lungs. Subtle increase in interstitial prominence and airspace opacities since prior exam in the right middle lobe and subpleural left upper lobe may reflect sequela of mild bronchitic change or pneumonia. Normal sized heart. Aortic atherosclerosis without aneurysm. No confluent pneumonic consolidations. Minimal atelectasis at the lung bases. No overt pulmonary edema. No acute osseous abnormality. IMPRESSION: 1. Emphysematous  hyperinflation of the lungs. 2. Subtle increase in airspace opacity and increased interstitial markings in the right middle lobe and subpleural left upper lobe may reflect sequela pneumonia and/or bronchitic change. 3. Aortic atherosclerosis. Electronically Signed   By: Tollie Eth M.D.   On: 01/21/2017 14:31    ECHO;- Left ventricle: The cavity size was normal. Wall thickness was   increased in a pattern of mild LVH. Systolic function was normal.   The estimated ejection fraction was in the range of 60% to 65%.   Wall motion was normal; there were no regional wall motion   abnormalities. Features are consistent with a pseudonormal left   ventricular filling pattern, with concomitant abnormal relaxation   and increased filling pressure (grade 2 diastolic dysfunction).   Subjective: He is feeling well, denies dyspnea. Cough improved.  Eager to go  home   Discharge Exam: Vitals:   01/23/17 1401 01/23/17 2115  BP: 121/71 105/84  Pulse: 86 78  Resp: 18 18  Temp: 98.2 F (36.8 C) 97.5 F (36.4 C)   Vitals:   01/23/17 1549 01/23/17 2113 01/23/17 2115 01/24/17 0828  BP:   105/84   Pulse:   78   Resp:   18   Temp:   97.5 F (36.4 C)   TempSrc:   Oral   SpO2: 94% 96% 96% 91%  Weight:      Height:        General: Pt is alert, awake, not in acute distress Cardiovascular: RRR, S1/S2 +, no rubs, no gallops Respiratory: CTA bilaterally, no wheezing, no rhonchi Abdominal: Soft, NT, ND, bowel sounds + Extremities: no edema, no cyanosis    The results of significant diagnostics from this hospitalization (including imaging, microbiology, ancillary and laboratory) are listed below for reference.     Microbiology: Recent Results (from the past 240 hour(s))  Blood Culture (routine x 2)     Status: None (Preliminary result)   Collection Time: 01/21/17  6:21 PM  Result Value Ref Range Status   Specimen Description BLOOD RIGHT ANTECUBITAL  Final   Special Requests BOTTLES DRAWN AEROBIC AND ANAEROBIC 10CC  Final   Culture NO GROWTH 2 DAYS  Final   Report Status PENDING  Incomplete  Blood Culture (routine x 2)     Status: None (Preliminary result)   Collection Time: 01/21/17  6:27 PM  Result Value Ref Range Status   Specimen Description BLOOD RIGHT FOREARM  Final   Special Requests NONE  Final   Culture NO GROWTH 2 DAYS  Final   Report Status PENDING  Incomplete     Labs: BNP (last 3 results) No results for input(s): BNP in the last 8760 hours. Basic Metabolic Panel:  Recent Labs Lab 01/21/17 1328 01/21/17 1652 01/22/17 0038 01/23/17 0558 01/24/17 0745  NA 136  --  136 135 136  K 2.9*  --  3.5 3.3* 3.7  CL 93*  --  99* 98* 97*  CO2 32  --  28 29 30   GLUCOSE 143*  --  202* 120* 117*  BUN 29*  --  22* 23* 13  CREATININE 1.71*  --  1.32* 1.21 1.02  CALCIUM 9.2  --  8.5* 8.8* 8.8*  MG  --  2.0  --   --   --     Liver Function Tests: No results for input(s): AST, ALT, ALKPHOS, BILITOT, PROT, ALBUMIN in the last 168 hours. No results for input(s): LIPASE, AMYLASE in the last 168 hours. No results for  input(s): AMMONIA in the last 168 hours. CBC:  Recent Labs Lab 01/21/17 1328 01/22/17 0038 01/23/17 0558 01/24/17 0745  WBC 18.4* 16.1* 17.3* 14.0*  HGB 14.2 13.3 13.4 13.3  HCT 42.8 40.3 40.1 40.0  MCV 96.2 96.6 95.0 94.1  PLT 251 234 269 254   Cardiac Enzymes:  Recent Labs Lab 01/22/17 0038 01/22/17 0640 01/22/17 1201 01/22/17 1804  TROPONINI 0.06* 0.18* <0.03 0.03*   BNP: Invalid input(s): POCBNP CBG: No results for input(s): GLUCAP in the last 168 hours. D-Dimer No results for input(s): DDIMER in the last 72 hours. Hgb A1c No results for input(s): HGBA1C in the last 72 hours. Lipid Profile No results for input(s): CHOL, HDL, LDLCALC, TRIG, CHOLHDL, LDLDIRECT in the last 72 hours. Thyroid function studies No results for input(s): TSH, T4TOTAL, T3FREE, THYROIDAB in the last 72 hours.  Invalid input(s): FREET3 Anemia work up No results for input(s): VITAMINB12, FOLATE, FERRITIN, TIBC, IRON, RETICCTPCT in the last 72 hours. Urinalysis    Component Value Date/Time   COLORURINE YELLOW 01/22/2017 1408   APPEARANCEUR CLEAR 01/22/2017 1408   LABSPEC 1.018 01/22/2017 1408   PHURINE 5.0 01/22/2017 1408   GLUCOSEU NEGATIVE 01/22/2017 1408   HGBUR NEGATIVE 01/22/2017 1408   BILIRUBINUR NEGATIVE 01/22/2017 1408   KETONESUR NEGATIVE 01/22/2017 1408   PROTEINUR 30 (A) 01/22/2017 1408   NITRITE NEGATIVE 01/22/2017 1408   LEUKOCYTESUR NEGATIVE 01/22/2017 1408   Sepsis Labs Invalid input(s): PROCALCITONIN,  WBC,  LACTICIDVEN Microbiology Recent Results (from the past 240 hour(s))  Blood Culture (routine x 2)     Status: None (Preliminary result)   Collection Time: 01/21/17  6:21 PM  Result Value Ref Range Status   Specimen Description BLOOD RIGHT ANTECUBITAL  Final    Special Requests BOTTLES DRAWN AEROBIC AND ANAEROBIC 10CC  Final   Culture NO GROWTH 2 DAYS  Final   Report Status PENDING  Incomplete  Blood Culture (routine x 2)     Status: None (Preliminary result)   Collection Time: 01/21/17  6:27 PM  Result Value Ref Range Status   Specimen Description BLOOD RIGHT FOREARM  Final   Special Requests NONE  Final   Culture NO GROWTH 2 DAYS  Final   Report Status PENDING  Incomplete     Time coordinating discharge: Over 30 minutes  SIGNED:   Alba Cory, MD  [facility] 01/24/2017, 11:19 AM Pager [phone]  If 7PM-7AM, please contact night-coverage [email or web address] Password [email or web address]

## 2017-01-24 NOTE — Care Management Important Message (Signed)
Important Message  Patient Details  Name: [name] MRN: [id number] Date of Birth: Aug 08, 1941   Medicare Important Message Given:  Yes    Kyla Balzarine 01/24/2017, 10:57 AM

## 2017-01-26 LAB — CULTURE, BLOOD (ROUTINE X 2)
Culture: NO GROWTH
Culture: NO GROWTH

## 2017-01-29 ENCOUNTER — Inpatient Hospital Stay: Payer: Medicare Other | Admitting: Internal Medicine

## 2017-01-30 ENCOUNTER — Other Ambulatory Visit: Payer: Self-pay | Admitting: Internal Medicine

## 2017-02-07 ENCOUNTER — Other Ambulatory Visit: Payer: Self-pay | Admitting: Internal Medicine

## 2017-02-17 ENCOUNTER — Ambulatory Visit (INDEPENDENT_AMBULATORY_CARE_PROVIDER_SITE_OTHER): Payer: Medicare Other | Admitting: Internal Medicine

## 2017-02-17 ENCOUNTER — Encounter: Payer: Self-pay | Admitting: Internal Medicine

## 2017-02-17 ENCOUNTER — Ambulatory Visit (INDEPENDENT_AMBULATORY_CARE_PROVIDER_SITE_OTHER)
Admission: RE | Admit: 2017-02-17 | Discharge: 2017-02-17 | Disposition: A | Discharge status: Home / Self Care | Payer: Medicare Other | Source: Ambulatory Visit | Attending: Internal Medicine | Admitting: Internal Medicine

## 2017-02-17 VITALS — BP 140/72 | HR 70 | Temp 97.7°F | Resp 16 | Ht 69.0 in | Wt 153.2 lb

## 2017-02-17 DIAGNOSIS — J449 Chronic obstructive pulmonary disease, unspecified: Secondary | ICD-10-CM

## 2017-02-17 DIAGNOSIS — I2510 Atherosclerotic heart disease of native coronary artery without angina pectoris: Secondary | ICD-10-CM | POA: Diagnosis not present

## 2017-02-17 DIAGNOSIS — J189 Pneumonia, unspecified organism: Secondary | ICD-10-CM

## 2017-02-17 DIAGNOSIS — I10 Essential (primary) hypertension: Secondary | ICD-10-CM | POA: Diagnosis not present

## 2017-02-17 DIAGNOSIS — Z8701 Personal history of pneumonia (recurrent): Secondary | ICD-10-CM | POA: Diagnosis not present

## 2017-02-17 DIAGNOSIS — J181 Lobar pneumonia, unspecified organism: Secondary | ICD-10-CM | POA: Diagnosis not present

## 2017-02-17 NOTE — Patient Instructions (Signed)
Chronic Obstructive Pulmonary Disease Chronic obstructive pulmonary disease (COPD) is a common lung condition in which airflow from the lungs is limited. COPD is a general term that can be used to describe many different lung problems that limit airflow, including both chronic bronchitis and emphysema. If you have COPD, your lung function will probably never return to normal, but there are measures you can take to improve lung function and make yourself feel better. What are the causes?  Smoking (common).  Exposure to secondhand smoke.  Genetic problems.  Chronic inflammatory lung diseases or recurrent infections. What are the signs or symptoms?  Shortness of breath, especially with physical activity.  Deep, persistent (chronic) cough with a large amount of thick mucus.  Wheezing.  Rapid breaths (tachypnea).  Gray or bluish discoloration (cyanosis) of the skin, especially in your fingers, toes, or lips.  Fatigue.  Weight loss.  Frequent infections or episodes when breathing symptoms become much worse (exacerbations).  Chest tightness. How is this diagnosed? Your health care provider will take a medical history and perform a physical examination to diagnose COPD. Additional tests for COPD may include:  Lung (pulmonary) function tests.  Chest X-ray.  CT scan.  Blood tests. How is this treated? Treatment for COPD may include:  Inhaler and nebulizer medicines. These help manage the symptoms of COPD and make your breathing more comfortable.  Supplemental oxygen. Supplemental oxygen is only helpful if you have a low oxygen level in your blood.  Exercise and physical activity. These are beneficial for nearly all people with COPD.  Lung surgery or transplant.  Nutrition therapy to gain weight, if you are underweight.  Pulmonary rehabilitation. This may involve working with a team of health care providers and specialists, such as respiratory, occupational, and physical  therapists. Follow these instructions at home:  Take all medicines (inhaled or pills) as directed by your health care provider.  Avoid over-the-counter medicines or cough syrups that dry up your airway (such as antihistamines) and slow down the elimination of secretions unless instructed otherwise by your health care provider.  If you are a smoker, the most important thing that you can do is stop smoking. Continuing to smoke will cause further lung damage and breathing trouble. Ask your health care provider for help with quitting smoking. He or she can direct you to community resources or hospitals that provide support.  Avoid exposure to irritants such as smoke, chemicals, and fumes that aggravate your breathing.  Use oxygen therapy and pulmonary rehabilitation if directed by your health care provider. If you require home oxygen therapy, ask your health care provider whether you should purchase a pulse oximeter to measure your oxygen level at home.  Avoid contact with individuals who have a contagious illness.  Avoid extreme temperature and humidity changes.  Eat healthy foods. Eating smaller, more frequent meals and resting before meals may help you maintain your strength.  Stay active, but balance activity with periods of rest. Exercise and physical activity will help you maintain your ability to do things you want to do.  Preventing infection and hospitalization is very important when you have COPD. Make sure to receive all the vaccines your health care provider recommends, especially the pneumococcal and influenza vaccines. Ask your health care provider whether you need a pneumonia vaccine.  Learn and use relaxation techniques to manage stress.  Learn and use controlled breathing techniques as directed by your health care provider. Controlled breathing techniques include: 1. Pursed lip breathing. Start by breathing in (inhaling)   through your nose for 1 second. Then, purse your lips as  if you were going to whistle and breathe out (exhale) through the pursed lips for 2 seconds. 2. Diaphragmatic breathing. Start by putting one hand on your abdomen just above your waist. Inhale slowly through your nose. The hand on your abdomen should move out. Then purse your lips and exhale slowly. You should be able to feel the hand on your abdomen moving in as you exhale.  Learn and use controlled coughing to clear mucus from your lungs. Controlled coughing is a series of short, progressive coughs. The steps of controlled coughing are: 1. Lean your head slightly forward. 2. Breathe in deeply using diaphragmatic breathing. 3. Try to hold your breath for 3 seconds. 4. Keep your mouth slightly open while coughing twice. 5. Spit any mucus out into a tissue. 6. Rest and repeat the steps once or twice as needed. Contact a health care provider if:  You are coughing up more mucus than usual.  There is a change in the color or thickness of your mucus.  Your breathing is more labored than usual.  Your breathing is faster than usual. Get help right away if:  You have shortness of breath while you are resting.  You have shortness of breath that prevents you from:  Being able to talk.  Performing your usual physical activities.  You have chest pain lasting longer than 5 minutes.  Your skin color is more cyanotic than usual.  You measure low oxygen saturations for longer than 5 minutes with a pulse oximeter. This information is not intended to replace advice given to you by your health care provider. Make sure you discuss any questions you have with your health care provider. Document Released: 07/24/2005 Document Revised: 03/21/2016 Document Reviewed: 06/10/2013 [employer] Interactive Patient Education  2017 [employer]  

## 2017-02-17 NOTE — Progress Notes (Signed)
Subjective:  Patient ID: [name], male    DOB: 1940/11/18  Age: 76 y.o. MRN: [id number]  CC: COPD   HPI [name] presents for follow-up after a recent admission for pneumonia. He tells me he feels much better and his had no recent episodes of cough, shortness of breath, night sweats, fever, chills, chest pain, or shortness of breath. He has rare wheezing that is adequately treated with albuterol.  Outpatient Medications Prior to Visit  Medication Sig Dispense Refill  . albuterol (PROVENTIL) (2.5 MG/3ML) 0.083% nebulizer solution Take 3 mLs (2.5 mg total) by nebulization every 6 (six) hours as needed for wheezing or shortness of breath. 75 mL 12  . aspirin EC 81 MG EC tablet Take 1 tablet (81 mg total) by mouth daily. 30 tablet 0  . bisoprolol (ZEBETA) 5 MG tablet TAKE 1 TABLET(5 MG) BY MOUTH DAILY 90 tablet 0  . rosuvastatin (CRESTOR) 40 MG tablet Take 1 tablet (40 mg total) by mouth daily. (Patient taking differently: Take 40 mg by mouth every evening. ) 30 tablet 10  . Tiotropium Bromide-Olodaterol (STIOLTO RESPIMAT) 2.5-2.5 MCG/ACT AERS Inhale 2 puffs into the lungs daily. (Patient taking differently: Inhale 3 puffs into the lungs daily. ) 1 Inhaler 0  . Multiple Vitamin (MULTIVITAMIN) tablet Take 1 tablet by mouth daily.    Marland Kitchen levofloxacin (LEVAQUIN) 500 MG tablet Take 1 tablet (500 mg total) by mouth daily. 4 tablet 0   No facility-administered medications prior to visit.     ROS Review of Systems  Constitutional: Negative for chills, diaphoresis, fatigue and unexpected weight change.  HENT: Negative.  Negative for sore throat.   Eyes: Negative for visual disturbance.  Respiratory: Positive for wheezing. Negative for cough, choking, chest tightness and shortness of breath.   Cardiovascular: Negative.  Negative for chest pain, palpitations and leg swelling.  Gastrointestinal: Negative for abdominal pain, constipation, diarrhea, nausea and vomiting.  Endocrine: Negative.    Genitourinary: Negative.  Negative for difficulty urinating.  Musculoskeletal: Negative.  Negative for back pain and neck pain.  Skin: Negative.  Negative for color change and rash.  Allergic/Immunologic: Negative.   Neurological: Negative.  Negative for dizziness.  Hematological: Negative for adenopathy. Does not bruise/bleed easily.  Psychiatric/Behavioral: Negative.     Objective:  BP 140/72 (BP Location: Left Arm, Patient Position: Sitting, Cuff Size: Normal)   Pulse 70   Temp 97.7 F (36.5 C) (Oral)   Resp 16   Ht 5\' 9"  (1.753 m)   Wt 153 lb 4 oz (69.5 kg)   SpO2 100%   BMI 22.63 kg/m   BP Readings from Last 3 Encounters:  02/17/17 140/72  01/24/17 139/80  12/23/16 130/72    Wt Readings from Last 3 Encounters:  02/17/17 153 lb 4 oz (69.5 kg)  01/21/17 162 lb (73.5 kg)  12/23/16 156 lb (70.8 kg)    Physical Exam  Constitutional: He is oriented to person, place, and time.  Non-toxic appearance. He does not have a sickly appearance. He does not appear ill. No distress.  HENT:  Mouth/Throat: Oropharynx is clear and moist. No oropharyngeal exudate.  Eyes: Conjunctivae are normal. Right eye exhibits no discharge. Left eye exhibits no discharge. No scleral icterus.  Neck: Normal range of motion. Neck supple. No JVD present. No tracheal deviation present. No thyromegaly present.  Cardiovascular: Normal rate, regular rhythm, normal heart sounds and intact distal pulses.  Exam reveals no gallop and no friction rub.   No murmur heard. Pulmonary/Chest:  Effort normal. No accessory muscle usage or stridor. No tachypnea. No respiratory distress. He has no decreased breath sounds. He has wheezes in the right lower field. He has no rhonchi. He has no rales. He exhibits no tenderness.  Abdominal: Soft. Bowel sounds are normal. He exhibits no distension and no mass. There is no tenderness. There is no rebound and no guarding.  Musculoskeletal: Normal range of motion. He exhibits no  edema, tenderness or deformity.  Lymphadenopathy:    He has no cervical adenopathy.  Neurological: He is oriented to person, place, and time.  Skin: Skin is warm and dry. No rash noted. He is not diaphoretic. No erythema. No pallor.  Vitals reviewed.   Lab Results  Component Value Date   WBC 14.0 (H) 01/24/2017   HGB 13.3 01/24/2017   HCT 40.0 01/24/2017   PLT 254 01/24/2017   GLUCOSE 117 (H) 01/24/2017   CHOL 145 09/04/2016   TRIG 186.0 (H) 09/04/2016   HDL 45.90 09/04/2016   LDLDIRECT 77.7 04/12/2008   LDLCALC 62 09/04/2016   ALT 14 09/04/2016   AST 17 09/04/2016   NA 136 01/24/2017   K 3.7 01/24/2017   CL 97 (L) 01/24/2017   CREATININE 1.02 01/24/2017   BUN 13 01/24/2017   CO2 30 01/24/2017   TSH 0.86 09/04/2016    Dg Chest 2 View  Result Date: 01/21/2017 CLINICAL DATA:  Dyspnea and generalized weakness x1 week EXAM: CHEST  2 VIEW COMPARISON:  10/15/2016 CXR FINDINGS: Emphysematous hyperinflation of the lungs. Subtle increase in interstitial prominence and airspace opacities since prior exam in the right middle lobe and subpleural left upper lobe may reflect sequela of mild bronchitic change or pneumonia. Normal sized heart. Aortic atherosclerosis without aneurysm. No confluent pneumonic consolidations. Minimal atelectasis at the lung bases. No overt pulmonary edema. No acute osseous abnormality. IMPRESSION: 1. Emphysematous hyperinflation of the lungs. 2. Subtle increase in airspace opacity and increased interstitial markings in the right middle lobe and subpleural left upper lobe may reflect sequela pneumonia and/or bronchitic change. 3. Aortic atherosclerosis. Electronically Signed   By: Tollie Eth M.D.   On: 01/21/2017 14:31   Dg Chest 2 View  Result Date: 02/17/2017 CLINICAL DATA:  Follow-up evaluation.  History of pneumonia. EXAM: CHEST  2 VIEW COMPARISON:  Chest radiograph 01/23/2017 FINDINGS: Stable cardiac and mediastinal contours. No consolidative pulmonary  opacities. No pleural effusion or pneumothorax. Thoracic spine degenerative changes. IMPRESSION: No active cardiopulmonary disease. Electronically Signed   By: Annia Belt M.D.   On: 02/17/2017 17:00   Dg Chest 2 View  Result Date: 01/23/2017 CLINICAL DATA:  Recent pneumonia EXAM: CHEST  2 VIEW COMPARISON:  January 21, 2017 FINDINGS: There is focal consolidation in the left base with small left pleural effusion. There is slight scarring in the right base. There is mild fibrotic change in the lung bases as well. Lungs elsewhere clear. Heart size and pulmonary vascularity are normal. No adenopathy. No bone lesions. IMPRESSION: Small area of left base consolidation with small left pleural effusion. Fibrotic change is noted in the lung bases. Stable cardiac silhouette. Electronically Signed   By: Bretta Bang III M.D.   On: 01/23/2017 13:14   Dg Chest 2 View  Result Date: 01/21/2017 CLINICAL DATA:  Dyspnea and generalized weakness x1 week EXAM: CHEST  2 VIEW COMPARISON:  10/15/2016 CXR FINDINGS: Emphysematous hyperinflation of the lungs. Subtle increase in interstitial prominence and airspace opacities since prior exam in the right middle lobe and subpleural left upper  lobe may reflect sequela of mild bronchitic change or pneumonia. Normal sized heart. Aortic atherosclerosis without aneurysm. No confluent pneumonic consolidations. Minimal atelectasis at the lung bases. No overt pulmonary edema. No acute osseous abnormality. IMPRESSION: 1. Emphysematous hyperinflation of the lungs. 2. Subtle increase in airspace opacity and increased interstitial markings in the right middle lobe and subpleural left upper lobe may reflect sequela pneumonia and/or bronchitic change. 3. Aortic atherosclerosis. Electronically Signed   By: Tollie Eth M.D.   On: 01/21/2017 14:31     Assessment & Plan:   Hymie was seen today for copd.  Diagnoses and all orders for this visit:  Community acquired pneumonia of right middle  lobe of lung (HCC)- based on his symptoms and normal chest x-ray this has resolved -     DG Chest 2 View; Future  COPD III/ hypercarbic- he is doing well on the current combination of a short acting beta agonist as well as a LAMA. Will continue.  Essential hypertension, benign- his blood pressure is adequately well controlled   I have discontinued Mr. Dewan multivitamin and levofloxacin. I am also having him maintain his rosuvastatin, Tiotropium Bromide-Olodaterol, aspirin, albuterol, and bisoprolol.  No orders of the defined types were placed in this encounter.    Follow-up: Return if symptoms worsen or fail to improve.  Sanda Linger, MD

## 2017-02-17 NOTE — Progress Notes (Signed)
Pre visit review using our clinic review tool, if applicable. No additional management support is needed unless otherwise documented below in the visit note. 

## 2017-02-18 ENCOUNTER — Telehealth: Payer: Self-pay | Admitting: Internal Medicine

## 2017-02-18 DIAGNOSIS — J189 Pneumonia, unspecified organism: Secondary | ICD-10-CM

## 2017-02-18 DIAGNOSIS — J181 Lobar pneumonia, unspecified organism: Principal | ICD-10-CM

## 2017-02-18 NOTE — Telephone Encounter (Signed)
Pt's Spouse, Gigi Gin, came by office and wanted to find out if it is okay for [facility] to pick up the oxygen equipment from their house since Mr. Anspach had his hospital f/u yesterday and was instructed to return to see Dr. Yetta Barre only if symptoms worsen or fail.  Both pt & Peggy are confused on exactly what they need to do.  I gave pt a copy of the [facility] form and gave you the original in case you need it for any reason.

## 2017-02-24 NOTE — Telephone Encounter (Signed)
Let pt know that I am waiting okay to put in the dc order for oxygen.

## 2017-02-24 NOTE — Telephone Encounter (Signed)
Pt wife called in again about someone coming to pick up all the equipment.  They are telling her that dr has to ok to release him from home health????????

## 2017-02-25 ENCOUNTER — Telehealth: Payer: Self-pay | Admitting: Internal Medicine

## 2017-02-25 NOTE — Telephone Encounter (Signed)
Called  [facility]. [phone]  Scheduled pick up of oxygen tank from patients home.    Order entered. Can you please consign it.

## 2017-02-25 NOTE — Telephone Encounter (Signed)
Pt wife called and needs Dr. Tommas Olp to come pick up his oxygen machine. Pt hasnt used his oxygen in a couple of weeks.  [facility]. [phone]

## 2017-02-25 NOTE — Telephone Encounter (Signed)
There is already a note started about this.

## 2017-02-26 NOTE — Telephone Encounter (Signed)
[facility] has received the order and will contact pt.   Pt spouse informed of same.

## 2017-03-31 NOTE — Addendum Note (Signed)
Addendum  created 03/31/17 0955 by Val Eagle, MD   Sign clinical note

## 2017-05-05 ENCOUNTER — Other Ambulatory Visit: Payer: Self-pay | Admitting: Internal Medicine

## 2017-08-01 ENCOUNTER — Other Ambulatory Visit: Payer: Self-pay | Admitting: Internal Medicine

## 2017-10-02 ENCOUNTER — Other Ambulatory Visit: Payer: Self-pay | Admitting: Internal Medicine

## 2017-10-27 ENCOUNTER — Other Ambulatory Visit: Payer: Self-pay | Admitting: Internal Medicine

## 2017-11-01 ENCOUNTER — Other Ambulatory Visit: Payer: Self-pay | Admitting: Cardiology

## 2017-11-02 ENCOUNTER — Other Ambulatory Visit: Payer: Self-pay | Admitting: Internal Medicine

## 2017-11-03 ENCOUNTER — Other Ambulatory Visit: Payer: Self-pay | Admitting: Cardiology

## 2017-11-04 ENCOUNTER — Other Ambulatory Visit: Payer: Self-pay | Admitting: Internal Medicine

## 2017-11-24 ENCOUNTER — Other Ambulatory Visit: Payer: Self-pay | Admitting: Cardiology

## 2017-11-26 ENCOUNTER — Other Ambulatory Visit: Payer: Self-pay | Admitting: Nurse Practitioner

## 2017-11-26 DIAGNOSIS — I6523 Occlusion and stenosis of bilateral carotid arteries: Secondary | ICD-10-CM

## 2017-12-05 ENCOUNTER — Ambulatory Visit (HOSPITAL_COMMUNITY)
Admission: RE | Admit: 2017-12-05 | Discharge: 2017-12-05 | Disposition: A | Discharge status: Home / Self Care | Payer: Medicare Other | Source: Ambulatory Visit | Attending: Cardiology | Admitting: Cardiology

## 2017-12-05 DIAGNOSIS — I6523 Occlusion and stenosis of bilateral carotid arteries: Secondary | ICD-10-CM | POA: Insufficient documentation

## 2017-12-08 ENCOUNTER — Telehealth: Payer: Self-pay | Admitting: *Deleted

## 2017-12-08 DIAGNOSIS — I6523 Occlusion and stenosis of bilateral carotid arteries: Secondary | ICD-10-CM

## 2017-12-08 DIAGNOSIS — I679 Cerebrovascular disease, unspecified: Secondary | ICD-10-CM

## 2017-12-08 DIAGNOSIS — E782 Mixed hyperlipidemia: Secondary | ICD-10-CM

## 2017-12-08 NOTE — Telephone Encounter (Signed)
Spoke to patient.  CAROTID Result given . Verbalized understanding  APPT 02/16/18 OFFICE VISIT  ORDER  F/U CAROTID FOR 2020

## 2017-12-08 NOTE — Telephone Encounter (Signed)
-----   Message from Creig Hines, NP sent at 12/08/2017  9:06 AM EST ----- Stable left internal carotid dzs with known occlusion on the right.  F/u in u/s in 1 yr (please place under Dr. Jens Som).

## 2017-12-23 ENCOUNTER — Other Ambulatory Visit: Payer: Self-pay | Admitting: Cardiology

## 2017-12-23 NOTE — Telephone Encounter (Signed)
REFILL 

## 2017-12-24 NOTE — Telephone Encounter (Signed)
Rx(s) sent to pharmacy electronically.  

## 2017-12-29 ENCOUNTER — Other Ambulatory Visit: Payer: Self-pay | Admitting: Cardiology

## 2018-02-02 NOTE — Progress Notes (Signed)
HPI: FU coronary artery disease. His last cardiac catheterization on August 04, 2006, showed an ejection fraction of 65%. The distal LAD had a 50% stenosis at the origin of the diagonal. The right coronary artery stent was patent. There is a 20% in-stent restenosis. Nuclear study in January of 2014 showed an ejection fraction of 74% and no ischemia. Note an abdominal ultrasound in May 2005 showed no aneurysm. Last echocardiogram March 2018 showed normal LV function and grade 2 diastolic dysfunction.  Carotid Dopplers February 2019 showed 40-59% left and totally occluded right.  Follow-up study recommended 1 year.  Since I last saw him, patient denies dyspnea, chest pain, palpitations or syncope.  Current Outpatient Medications  Medication Sig Dispense Refill  . albuterol (PROVENTIL) (2.5 MG/3ML) 0.083% nebulizer solution Take 3 mLs (2.5 mg total) by nebulization every 6 (six) hours as needed for wheezing or shortness of breath. 75 mL 12  . aspirin EC 81 MG EC tablet Take 1 tablet (81 mg total) by mouth daily. 30 tablet 0  . hydrochlorothiazide (MICROZIDE) 12.5 MG capsule TAKE ONE CAPSULE BY MOUTH DAILY 90 capsule 0  . rosuvastatin (CRESTOR) 40 MG tablet TAKE 1 TABLET(40 MG) BY MOUTH DAILY 90 tablet 0  . Tiotropium Bromide-Olodaterol (STIOLTO RESPIMAT) 2.5-2.5 MCG/ACT AERS Inhale 2 puffs into the lungs daily. (Patient taking differently: Inhale 3 puffs into the lungs daily. ) 1 Inhaler 0   No current facility-administered medications for this visit.      Past Medical History:  Diagnosis Date  . Asthma    as a child  . Carotid arterial disease (HCC)    a. 11/2015 Carotid U/S: LICA 40-59%, RICA 100 CTO, nl subclavian arteries--f/u 1 yr.  . Coronary atherosclerosis of native coronary artery    a. 09/2001 inflat MI/PCI: RCA 50m (3.0x18 A[address] S7 BMS); b. 07/2006 Cath: LM nl, LAD 50p, LCX min irregs, OM1 large, min irregs, OM2 min irregs, RCA 20 ISR;  c. 10/2012 MV: EF 74%, no ischemia.  .  Essential hypertension   . Heart murmur    since a child  . Hyperlipidemia   . Myocardial infarction (HCC) 2002  . Pneumonia    age 77  . Right inguinal hernia     Past Surgical History:  Procedure Laterality Date  . CARDIAC CATHETERIZATION    . Carotid stents    . COLONOSCOPY    . FRACTURE SURGERY    . HERNIA REPAIR     ventral  . INGUINAL HERNIA REPAIR Right 11/01/2016   Procedure: RIGHT INGUINAL HERNIA REPAIR WITH MESH;  Surgeon: Avel Peace, MD;  Location: [facility] OR;  Service: General;  Laterality: Right;  . INSERTION OF MESH Right 11/01/2016   Procedure: INSERTION OF MESH;  Surgeon: Avel Peace, MD;  Location: [facility] OR;  Service: General;  Laterality: Right;  . ORIF SHOULDER FRACTURE Right     Social History   Socioeconomic History  . Marital status: Married    Spouse name: Not on file  . Number of children: Not on file  . Years of education: Not on file  . Highest education level: Not on file  Occupational History    Employer: RETIRED  Social Needs  . Financial resource strain: Not on file  . Food insecurity:    Worry: Not on file    Inability: Not on file  . Transportation needs:    Medical: Not on file    Non-medical: Not on file  Tobacco Use  . Smoking status:  Former Smoker    Packs/day: 1.00    Years: 44.00    Pack years: 44.00    Types: Cigarettes    Last attempt to quit: 10/28/1998    Years since quitting: 19.3  . Smokeless tobacco: Former Neurosurgeon    Types: Chew  . Tobacco comment: quit chewing tobacco 2001 ish  Substance and Sexual Activity  . Alcohol use: No    Alcohol/week: 0.0 oz  . Drug use: No  . Sexual activity: Not on file  Lifestyle  . Physical activity:    Days per week: Not on file    Minutes per session: Not on file  . Stress: Not on file  Relationships  . Social connections:    Talks on phone: Not on file    Gets together: Not on file    Attends religious service: Not on file    Active member of club or organization: Not on file     Attends meetings of clubs or organizations: Not on file    Relationship status: Not on file  . Intimate partner violence:    Fear of current or ex partner: Not on file    Emotionally abused: Not on file    Physically abused: Not on file    Forced sexual activity: Not on file  Other Topics Concern  . Not on file  Social History Narrative   Lives in [place] with his wife.    Family History  Problem Relation Age of Onset  . Hypertension Other     ROS: no fevers or chills, productive cough, hemoptysis, dysphasia, odynophagia, melena, hematochezia, dysuria, hematuria, rash, seizure activity, orthopnea, PND, pedal edema, claudication. Remaining systems are negative.  Physical Exam: Well-developed well-nourished in no acute distress.  Skin is warm and dry.  HEENT is normal.  Neck is supple.  Chest with diminished breath sounds throughout and mild expiratory wheeze Cardiovascular exam is regular rate and rhythm.  Abdominal exam nontender or distended. No masses palpated. Extremities show no edema. neuro grossly intact  ECG-sinus tachycardia PVCs, left anterior fascicular block and no ST changes.  Personally reviewed  A/P  1 coronary artery disease status post prior PCI-patient continues to do well with no chest pain.  Continue medical therapy including aspirin and statin.  2 carotid artery disease-we will arrange follow-up carotid Dopplers February 2020.  Continue aspirin and statin.  3 hypertension-blood pressure is elevated.  He would prefer no additional medications at this point.  I have asked him to follow his blood pressure at home and if systolic is greater than 130 or diastolic greater than 85 we will add additional medications.  Check potassium and renal function.  4 hyperlipidemia-continue statin.  Check lipids and liver.  Olga Millers, MD

## 2018-02-05 ENCOUNTER — Other Ambulatory Visit: Payer: Self-pay | Admitting: Cardiology

## 2018-02-05 NOTE — Telephone Encounter (Signed)
REFILL 

## 2018-02-16 ENCOUNTER — Ambulatory Visit (INDEPENDENT_AMBULATORY_CARE_PROVIDER_SITE_OTHER): Payer: Medicare Other | Admitting: Cardiology

## 2018-02-16 ENCOUNTER — Encounter: Payer: Self-pay | Admitting: Cardiology

## 2018-02-16 VITALS — BP 152/84 | HR 104 | Ht 69.0 in | Wt 163.6 lb

## 2018-02-16 DIAGNOSIS — I10 Essential (primary) hypertension: Secondary | ICD-10-CM

## 2018-02-16 DIAGNOSIS — I6523 Occlusion and stenosis of bilateral carotid arteries: Secondary | ICD-10-CM

## 2018-02-16 DIAGNOSIS — E782 Mixed hyperlipidemia: Secondary | ICD-10-CM | POA: Diagnosis not present

## 2018-02-16 DIAGNOSIS — I2510 Atherosclerotic heart disease of native coronary artery without angina pectoris: Secondary | ICD-10-CM | POA: Diagnosis not present

## 2018-02-16 LAB — LIPID PANEL
CHOL/HDL RATIO: 3 ratio (ref 0.0–5.0)
Cholesterol, Total: 158 mg/dL (ref 100–199)
HDL: 53 mg/dL (ref >39)
LDL Calculated: 69 mg/dL (ref 0–99)
Triglycerides: 178 mg/dL — ABNORMAL HIGH (ref 0–149)
VLDL Cholesterol Cal: 36 mg/dL (ref 5–40)

## 2018-02-16 LAB — COMPREHENSIVE METABOLIC PANEL
ALT: 20 IU/L (ref 0–44)
AST: 25 IU/L (ref 0–40)
Albumin/Globulin Ratio: 1.9 (ref 1.2–2.2)
Albumin: 4.7 g/dL (ref 3.5–4.8)
Alkaline Phosphatase: 68 IU/L (ref 39–117)
BILIRUBIN TOTAL: 0.4 mg/dL (ref 0.0–1.2)
BUN/Creatinine Ratio: 12 (ref 10–24)
BUN: 14 mg/dL (ref 8–27)
CALCIUM: 10.6 mg/dL — AB (ref 8.6–10.2)
CHLORIDE: 91 mmol/L — AB (ref 96–106)
CO2: 31 mmol/L — ABNORMAL HIGH (ref 20–29)
CREATININE: 1.16 mg/dL (ref 0.76–1.27)
GFR, EST AFRICAN AMERICAN: 69 mL/min/{1.73_m2} (ref >59)
GFR, EST NON AFRICAN AMERICAN: 60 mL/min/{1.73_m2} (ref >59)
GLUCOSE: 100 mg/dL — AB (ref 65–99)
Globulin, Total: 2.5 g/dL (ref 1.5–4.5)
Potassium: 4 mmol/L (ref 3.5–5.2)
Sodium: 138 mmol/L (ref 134–144)
TOTAL PROTEIN: 7.2 g/dL (ref 6.0–8.5)

## 2018-02-16 NOTE — Patient Instructions (Signed)
Medication Instructions:   NO CHANGE  Labwork:  Your physician recommends that you HAVE LAB WORK TODAY  Follow-Up:  Your physician wants you to follow-up in: ONE YEAR WITH DR CRENSHAW You will receive a reminder letter in the mail two months in advance. If you don't receive a letter, please call our office to schedule the follow-up appointment.   If you need a refill on your cardiac medications before your next appointment, please call your pharmacy.    

## 2018-02-17 ENCOUNTER — Encounter: Payer: Self-pay | Admitting: *Deleted

## 2018-04-13 IMAGING — DX DG CHEST 2V
2 series · 2 of 2 positions shown · non-contrast
Comparison: No recent prior .

CLINICAL DATA: Hernia repair.

EXAM:
CHEST  2 VIEW

[chest pa]
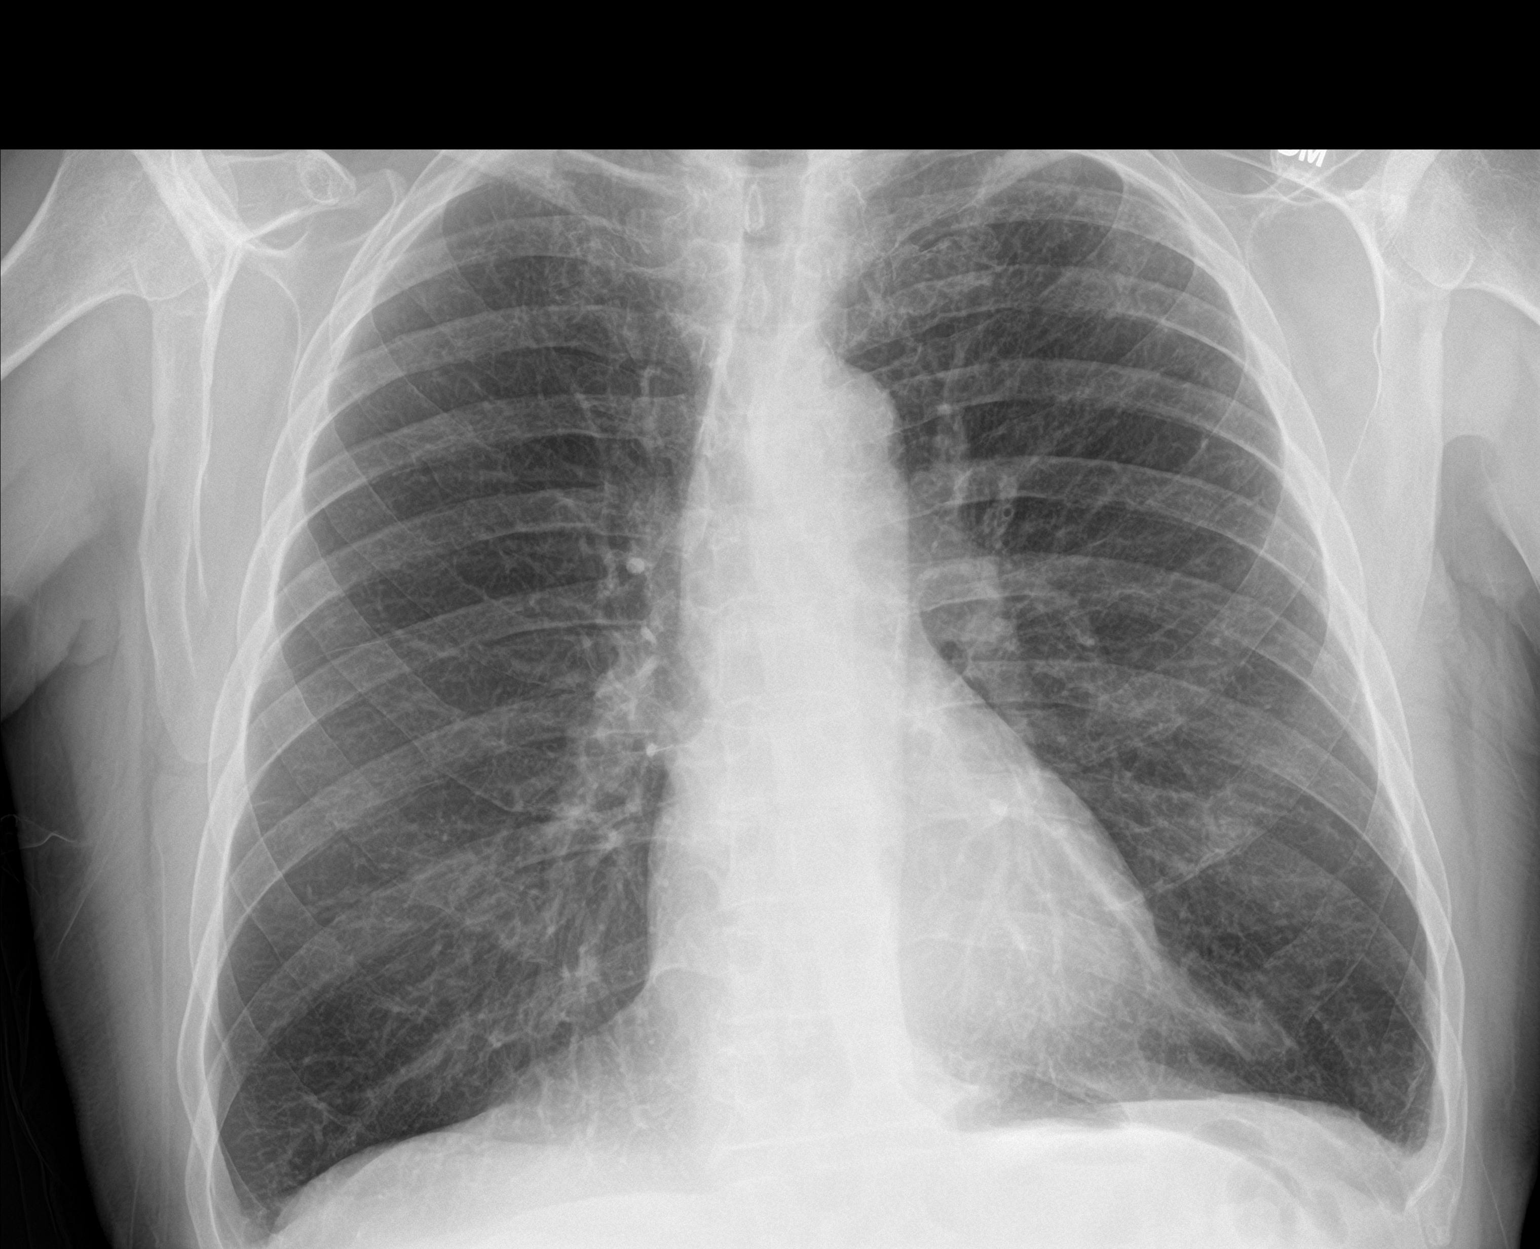

[chest lat]
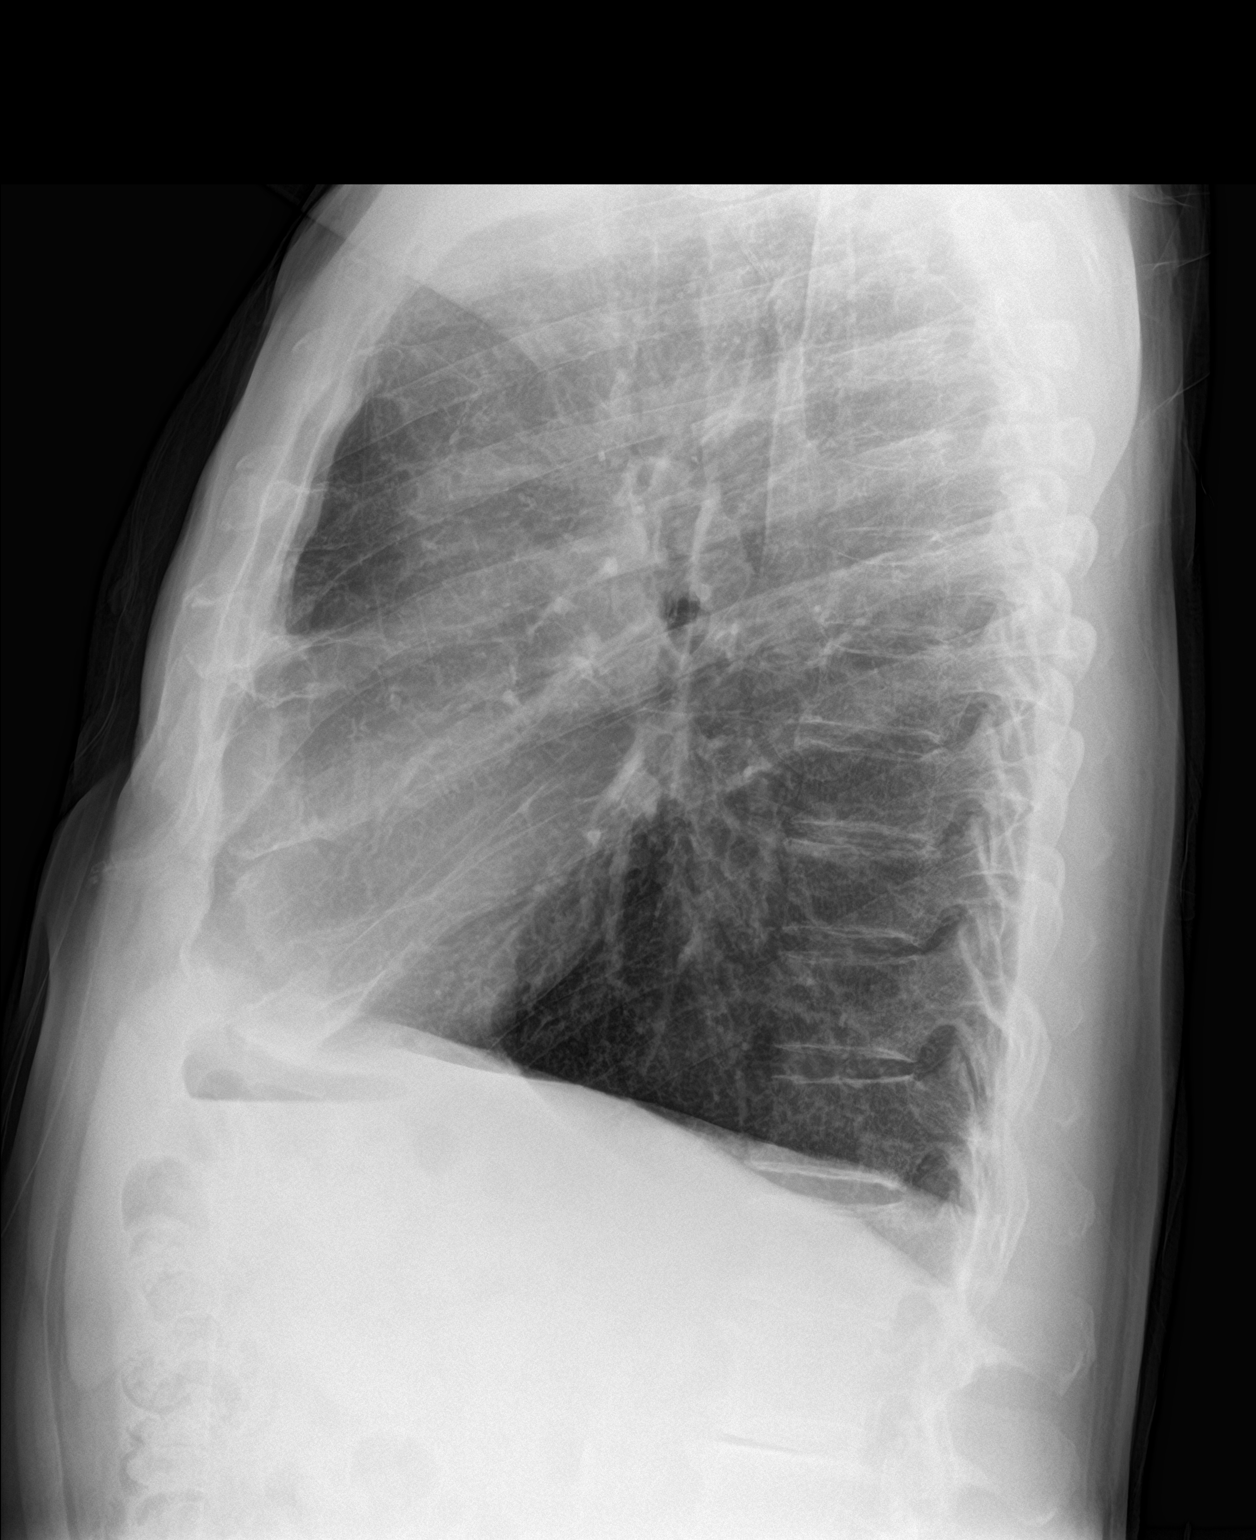

[2 of 2 positions shown; findings below may reference images not displayed]

FINDINGS: Mediastinum and hilar structures are normal. Heart size normal.
Lungs are clear. Mild bilateral pleural thickening noted consistent
with scarring and/or tiny effusions. No acute bony abnormality.
Degenerative changes both shoulders and thoracic spine.
IMPRESSION: Mild bilateral pleural thickening noted consistent with scarring
and/or tiny effusions Exam otherwise unremarkable .

## 2018-04-14 ENCOUNTER — Other Ambulatory Visit: Payer: Self-pay | Admitting: Cardiology

## 2018-04-19 ENCOUNTER — Other Ambulatory Visit: Payer: Self-pay | Admitting: Internal Medicine

## 2018-05-28 ENCOUNTER — Other Ambulatory Visit: Payer: Self-pay

## 2018-12-09 ENCOUNTER — Other Ambulatory Visit: Payer: Self-pay | Admitting: *Deleted

## 2018-12-09 ENCOUNTER — Ambulatory Visit (HOSPITAL_COMMUNITY)
Admission: RE | Admit: 2018-12-09 | Discharge: 2018-12-09 | Disposition: A | Discharge status: Home / Self Care | Payer: Medicare Other | Source: Ambulatory Visit | Attending: Cardiovascular Disease | Admitting: Cardiovascular Disease

## 2018-12-09 DIAGNOSIS — E782 Mixed hyperlipidemia: Secondary | ICD-10-CM | POA: Diagnosis not present

## 2018-12-09 DIAGNOSIS — I6523 Occlusion and stenosis of bilateral carotid arteries: Secondary | ICD-10-CM | POA: Insufficient documentation

## 2018-12-09 DIAGNOSIS — I679 Cerebrovascular disease, unspecified: Secondary | ICD-10-CM

## 2019-03-03 ENCOUNTER — Telehealth: Payer: Self-pay | Admitting: Cardiology

## 2019-03-03 ENCOUNTER — Telehealth: Payer: Self-pay

## 2019-03-03 NOTE — Telephone Encounter (Signed)
Left a detailed message for the patient about switching his upcoming appointment to a virtual visit and that he could call the office back and someone will be glad to assist with switching his appointment

## 2019-03-03 NOTE — Telephone Encounter (Signed)
New Message   Patient returning your phone call please give her a call back.

## 2019-03-04 ENCOUNTER — Telehealth: Payer: Self-pay

## 2019-03-04 NOTE — Telephone Encounter (Signed)
Virtual Visit Pre-Appointment Phone Call  "(Name), I am calling you today to discuss your upcoming appointment. We are currently trying to limit exposure to the virus that causes COVID-19 by seeing patients at home rather than in the office."  1. "What is the BEST phone number to call the day of the visit?" - include this in appointment notes  2. "Do you have or have access to (through a family member/friend) a smartphone with video capability that we can use for your visit?" a. If yes - list this number in appt notes as "cell" (if different from BEST phone #) and list the appointment type as a VIDEO visit in appointment notes b. If no - list the appointment type as a PHONE visit in appointment notes  3. Confirm consent - "In the setting of the current Covid19 crisis, you are scheduled for a PHONE visit with your provider on 5/12/20200 at 8:40AM.  Just as we do with many in-office visits, in order for you to participate in this visit, we must obtain consent.  If you'd like, I can send this to your mychart (if signed up) or email for you to review.  Otherwise, I can obtain your verbal consent now.  All virtual visits are billed to your insurance company just like a normal visit would be.  By agreeing to a virtual visit, we'd like you to understand that the technology does not allow for your provider to perform an examination, and thus may limit your provider's ability to fully assess your condition. If your provider identifies any concerns that need to be evaluated in person, we will make arrangements to do so.  Finally, though the technology is pretty good, we cannot assure that it will always work on either your or our end, and in the setting of a video visit, we may have to convert it to a phone-only visit.  In either situation, we cannot ensure that we have a secure connection.  Are you willing to proceed?" STAFF: Did the patient verbally acknowledge consent to telehealth visit? Document YES/NO  here: YES  4. Advise patient to be prepared - "Two hours prior to your appointment, go ahead and check your blood pressure, pulse, oxygen saturation, and your weight (if you have the equipment to check those) and write them all down. When your visit starts, your provider will ask you for this information. If you have an Apple Watch or Kardia device, please plan to have heart rate information ready on the day of your appointment. Please have a pen and paper handy nearby the day of the visit as well."  5. Give patient instructions for MyChart download to smartphone OR Doximity/Doxy.me as below if video visit (depending on what platform provider is using)  6. Inform patient they will receive a phone call 15 minutes prior to their appointment time (may be from unknown caller ID) so they should be prepared to answer    TELEPHONE CALL NOTE  [name] has been deemed a candidate for a follow-up tele-health visit to limit community exposure during the Covid-19 pandemic. I spoke with the patient via phone to ensure availability of phone/video source, confirm preferred email & phone number, and discuss instructions and expectations.  I reminded [name] to be prepared with any vital sign and/or heart rhythm information that could potentially be obtained via home monitoring, at the time of his visit. I reminded [name] to expect a phone call prior to his visit.  Dorris Fetch, CMA 03/04/2019 10:19 AM   INSTRUCTIONS FOR DOWNLOADING THE MYCHART APP TO SMARTPHONE  - The patient must first make sure to have activated MyChart and know their login information - If Apple, go to [employer] and type in MyChart in the search bar and download the app. If Android, ask patient to go to [employer] and type in [place] in the search bar and download the app. The app is free but as with any other app downloads, their phone may require them to verify saved payment information or Apple/Android password.   - The patient will need to then log into the app with their MyChart username and password, and select [facility] as their healthcare provider to link the account. When it is time for your visit, go to the MyChart app, find appointments, and click Begin Video Visit. Be sure to Select Allow for your device to access the Microphone and Camera for your visit. You will then be connected, and your provider will be with you shortly.  **If they have any issues connecting, or need assistance please contact MyChart service desk [phone])**  **If using a computer, in order to ensure the best quality for their visit they will need to use either of the following Internet Browsers: [employer] or Google Chrome**  IF USING DOXIMITY or DOXY.ME - The patient will receive a link just prior to their visit by text.     FULL LENGTH CONSENT FOR TELE-HEALTH VISIT   I hereby voluntarily request, consent and authorize [facility] and its employed or contracted physicians, physician assistants, nurse practitioners or other licensed health care professionals (the Practitioner), to provide me with telemedicine health care services (the "Services") as deemed necessary by the treating Practitioner. I acknowledge and consent to receive the Services by the Practitioner via telemedicine. I understand that the telemedicine visit will involve communicating with the Practitioner through live audiovisual communication technology and the disclosure of certain medical information by electronic transmission. I acknowledge that I have been given the opportunity to request an in-person assessment or other available alternative prior to the telemedicine visit and am voluntarily participating in the telemedicine visit.  I understand that I have the right to withhold or withdraw my consent to the use of telemedicine in the course of my care at any time, without affecting my right to future care or treatment, and that  the Practitioner or I may terminate the telemedicine visit at any time. I understand that I have the right to inspect all information obtained and/or recorded in the course of the telemedicine visit and may receive copies of available information for a reasonable fee.  I understand that some of the potential risks of receiving the Services via telemedicine include:  Marland Kitchen Delay or interruption in medical evaluation due to technological equipment failure or disruption; . Information transmitted may not be sufficient (e.g. poor resolution of images) to allow for appropriate medical decision making by the Practitioner; and/or  . In rare instances, security protocols could fail, causing a breach of personal health information.  Furthermore, I acknowledge that it is my responsibility to provide information about my medical history, conditions and care that is complete and accurate to the best of my ability. I acknowledge that Practitioner's advice, recommendations, and/or decision may be based on factors not within their control, such as incomplete or inaccurate data provided by me or distortions of diagnostic images or specimens that may result from electronic transmissions. I understand that the  practice of medicine is not an Chief Strategy Officer and that Practitioner makes no warranties or guarantees regarding treatment outcomes. I acknowledge that I will receive a copy of this consent concurrently upon execution via email to the email address I last provided but may also request a printed copy by calling the office of [facility].    I understand that my insurance will be billed for this visit.   I have read or had this consent read to me. . I understand the contents of this consent, which adequately explains the benefits and risks of the Services being provided via telemedicine.  . I have been provided ample opportunity to ask questions regarding this consent and the Services and have had my questions answered to  my satisfaction. . I give my informed consent for the services to be provided through the use of telemedicine in my medical care  By participating in this telemedicine visit I agree to the above.

## 2019-03-05 NOTE — Progress Notes (Signed)
Virtual Visit via Video Note changed to phone visit at patient request as no smart phone   This visit type was conducted due to national recommendations for restrictions regarding the COVID-19 Pandemic (e.g. social distancing) in an effort to limit this patient's exposure and mitigate transmission in our community.  Due to his co-morbid illnesses, this patient is at least at moderate risk for complications without adequate follow up.  This format is felt to be most appropriate for this patient at this time.  All issues noted in this document were discussed and addressed.  A limited physical exam was performed with this format.  Please refer to the patient's chart for his consent to telehealth for [facility].   Date:  03/09/2019   ID:  [name], DOB [birth year], MRN [id number]  Patient Location: Home Provider Location: Home  PCP:  Etta Grandchild, MD  Cardiologist:  Dr Jens Som  Evaluation Performed:  Follow-Up Visit  Chief Complaint:  CAD  History of Present Illness:    FU coronary artery disease. His last cardiac catheterization on August 04, 2006, showed an ejection fraction of 65%. The distal LAD had a 50% stenosis at the origin of the diagonal. The right coronary artery stent was patent. There is a 20% in-stent restenosis. Nuclear study in January of 2014 showed an ejection fraction of 74% and no ischemia. Note an abdominal ultrasound in May 2005 showed no aneurysm.Last echocardiogram March 2018 showed normal LV function and grade 2 diastolic dysfunction.  Carotid Dopplers February 2020 showed 40-59% left and totally occluded right.  Follow-up study recommended 1 year.  Since I last saw him, the patient denies any dyspnea on exertion, orthopnea, PND, pedal edema, palpitations, syncope or chest pain.   The patient does not have symptoms concerning for COVID-19 infection (fever, chills, cough, or new shortness of breath).    Past Medical History:  Diagnosis Date  . Asthma     as a child  . Carotid arterial disease (HCC)    a. 11/2015 Carotid U/S: LICA 40-59%, RICA 100 CTO, nl subclavian arteries--f/u 1 yr.  . Coronary atherosclerosis of native coronary artery    a. 09/2001 inflat MI/PCI: RCA 40m (3.0x[address] A[address] S7 BMS); b. 07/2006 Cath: LM nl, LAD 50p, LCX min irregs, OM1 large, min irregs, OM2 min irregs, RCA 20 ISR;  c. 10/2012 MV: EF 74%, no ischemia.  . Essential hypertension   . Heart murmur    since a child  . Hyperlipidemia   . Myocardial infarction (HCC) 2002  . Pneumonia    age 78  . Right inguinal hernia    Past Surgical History:  Procedure Laterality Date  . CARDIAC CATHETERIZATION    . Carotid stents    . COLONOSCOPY    . FRACTURE SURGERY    . HERNIA REPAIR     ventral  . INGUINAL HERNIA REPAIR Right 11/01/2016   Procedure: RIGHT INGUINAL HERNIA REPAIR WITH MESH;  Surgeon: Avel Peace, MD;  Location[facility] OR;  Service: General;  Laterality: Right;  . INSERTION OF MESH Right 11/01/2016   Procedure: INSERTION OF MESH;  Surgeon: Avel Peace, MD;  Location: [facility] OR;  Service: General;  Laterality: Right;  . ORIF SHOULDER FRACTURE Right      Current Meds  Medication Sig  . albuterol (PROVENTIL) (2.5 MG/3ML) 0.083% nebulizer solution USE 1 VIAL VIA NEBULIZER EVERY 6 HOURS AS NEEDED FOR WHEEZING OR SHORTNESS OF BREATH  . aspirin EC 81 MG EC tablet Take 1 tablet (81  mg total) by mouth daily.  . hydrochlorothiazide (MICROZIDE) 12.5 MG capsule TAKE ONE CAPSULE BY MOUTH DAILY  . rosuvastatin (CRESTOR) 40 MG tablet TAKE 1 TABLET(40 MG) BY MOUTH DAILY  . Tiotropium Bromide-Olodaterol (STIOLTO RESPIMAT) 2.5-2.5 MCG/ACT AERS Inhale 2 puffs into the lungs daily. (Patient taking differently: Inhale 3 puffs into the lungs daily. )     Allergies:   Penicillins   Social History   Tobacco Use  . Smoking status: Former Smoker    Packs/day: 1.00    Years: 44.00    Pack years: 44.00    Types: Cigarettes    Last attempt to quit: 10/28/1998    Years since  quitting: 20.3  . Smokeless tobacco: Former Neurosurgeon    Types: Chew  . Tobacco comment: quit chewing tobacco 2001 ish  Substance Use Topics  . Alcohol use: No    Alcohol/week: 0.0 standard drinks  . Drug use: No     Family Hx: The patient's family history includes Hypertension in an other family member.  ROS:   Please see the history of present illness.    No fevers, chills or productive cough. All other systems reviewed and are negative.  Recent Lipid Panel Lab Results  Component Value Date/Time   CHOL 158 02/16/2018 08:41 AM   TRIG 178 (H) 02/16/2018 08:41 AM   TRIG 139 10/02/2006 07:42 AM   HDL 53 02/16/2018 08:41 AM   CHOLHDL 3.0 02/16/2018 08:41 AM   CHOLHDL 3 09/04/2016 10:24 AM   LDLCALC 69 02/16/2018 08:41 AM   LDLDIRECT 77.7 04/12/2008 07:18 AM    Wt Readings from Last 3 Encounters:  03/09/19 167 lb (75.8 kg)  02/16/18 163 lb 9.6 oz (74.2 kg)  02/17/17 153 lb 4 oz (69.5 kg)     Objective:    Vital Signs:  Ht 5\' 9"  (1.753 m)   Wt 167 lb (75.8 kg)   BMI 24.66 kg/m    VITAL SIGNS:  reviewed  No acute distress Answers questions appropriately Normal affect Remainder physical examination not performed (coronavirus pandemic; telehealth visit)  ASSESSMENT & PLAN:    1. Coronary artery disease-patient denies chest pain.  Plan to continue medical therapy with aspirin and statin. 2. Carotid artery disease-patient will need follow-up carotid Dopplers February 2021. 3. Hypertension-patient's blood pressure is controlled.  Continue present medications and follow.  Check potassium and renal function. 4. Hyperlipidemia-continue statin.  Check lipids and liver.  COVID-19 Education: The importance of social distancing was discussed today.  Time:   Today, I have spent 15 minutes with the patient with telehealth technology discussing the above problems.     Medication Adjustments/Labs and Tests Ordered: Current medicines are reviewed at length with the patient today.   Concerns regarding medicines are outlined above.   Tests Ordered: No orders of the defined types were placed in this encounter.   Medication Changes: No orders of the defined types were placed in this encounter.   Disposition:  Follow up in 1 year(s)  Signed, Olga Millers, MD  03/09/2019 8:30 AM    [facility]

## 2019-03-09 ENCOUNTER — Telehealth (INDEPENDENT_AMBULATORY_CARE_PROVIDER_SITE_OTHER): Payer: Medicare Other | Admitting: Cardiology

## 2019-03-09 ENCOUNTER — Encounter: Payer: Self-pay | Admitting: Cardiology

## 2019-03-09 VITALS — Ht 69.0 in | Wt 167.0 lb

## 2019-03-09 DIAGNOSIS — I2510 Atherosclerotic heart disease of native coronary artery without angina pectoris: Secondary | ICD-10-CM

## 2019-03-09 DIAGNOSIS — E7800 Pure hypercholesterolemia, unspecified: Secondary | ICD-10-CM

## 2019-03-09 DIAGNOSIS — I6523 Occlusion and stenosis of bilateral carotid arteries: Secondary | ICD-10-CM

## 2019-03-09 DIAGNOSIS — I10 Essential (primary) hypertension: Secondary | ICD-10-CM

## 2019-03-09 NOTE — Patient Instructions (Signed)
Medication Instructions:  NO CHANGE If you need a refill on your cardiac medications before your next appointment, please call your pharmacy.   Lab work: Your physician recommends that you return for lab work PRIOR TO EATING If you have labs (blood work) drawn today and your tests are completely normal, you will receive your results only by: Marland Kitchen MyChart Message (if you have MyChart) OR . A paper copy in the mail If you have any lab test that is abnormal or we need to change your treatment, we will call you to review the results.  Testing/Procedures: Your physician has requested that you have a carotid duplex. This test is an ultrasound of the carotid arteries in your neck. It looks at blood flow through these arteries that supply the brain with blood. Allow one hour for this exam. There are no restrictions or special instructions.  DUE ON February 2021   Follow-Up: At [facility], you and your health needs are our priority.  As part of our continuing mission to provide you with exceptional heart care, we have created designated Provider Care Teams.  These Care Teams include your primary Cardiologist (physician) and Advanced Practice Providers (APPs -  Physician Assistants and Nurse Practitioners) who all work together to provide you with the care you need, when you need it. You will need a follow up appointment in 12 months.  Please call our office 2 months in advance to schedule this appointment.  You may see Olga Millers MD or one of the following Advanced Practice Providers on your designated Care Team:   Corine Shelter, PA-C Judy Pimple, [place] . Marjie Skiff, PA-C

## 2019-03-18 DIAGNOSIS — I10 Essential (primary) hypertension: Secondary | ICD-10-CM | POA: Diagnosis not present

## 2019-03-18 DIAGNOSIS — E7800 Pure hypercholesterolemia, unspecified: Secondary | ICD-10-CM | POA: Diagnosis not present

## 2019-03-18 LAB — COMPREHENSIVE METABOLIC PANEL
ALT: 22 IU/L (ref 0–44)
AST: 29 IU/L (ref 0–40)
Albumin/Globulin Ratio: 1.8 (ref 1.2–2.2)
Albumin: 4.7 g/dL (ref 3.7–4.7)
Alkaline Phosphatase: 71 IU/L (ref 39–117)
BUN/Creatinine Ratio: 14 (ref 10–24)
BUN: 16 mg/dL (ref 8–27)
Bilirubin Total: 0.5 mg/dL (ref 0.0–1.2)
CO2: 30 mmol/L — ABNORMAL HIGH (ref 20–29)
Calcium: 10.4 mg/dL — ABNORMAL HIGH (ref 8.6–10.2)
Chloride: 92 mmol/L — ABNORMAL LOW (ref 96–106)
Creatinine, Ser: 1.13 mg/dL (ref 0.76–1.27)
GFR calc Af Amer: 71 mL/min/{1.73_m2} (ref >59)
GFR calc non Af Amer: 61 mL/min/{1.73_m2} (ref >59)
Globulin, Total: 2.6 g/dL (ref 1.5–4.5)
Glucose: 123 mg/dL — ABNORMAL HIGH (ref 65–99)
Potassium: 3.6 mmol/L (ref 3.5–5.2)
Sodium: 138 mmol/L (ref 134–144)
Total Protein: 7.3 g/dL (ref 6.0–8.5)

## 2019-03-18 LAB — LIPID PANEL
Chol/HDL Ratio: 3.4 ratio (ref 0.0–5.0)
Cholesterol, Total: 175 mg/dL (ref 100–199)
HDL: 51 mg/dL (ref >39)
LDL Calculated: 77 mg/dL (ref 0–99)
Triglycerides: 237 mg/dL — ABNORMAL HIGH (ref 0–149)
VLDL Cholesterol Cal: 47 mg/dL — ABNORMAL HIGH (ref 5–40)

## 2019-03-19 ENCOUNTER — Encounter: Payer: Self-pay | Admitting: *Deleted

## 2019-03-29 ENCOUNTER — Other Ambulatory Visit: Payer: Self-pay | Admitting: Internal Medicine

## 2019-04-01 ENCOUNTER — Encounter: Payer: Self-pay | Admitting: Internal Medicine

## 2019-04-01 ENCOUNTER — Telehealth: Payer: Self-pay

## 2019-04-01 ENCOUNTER — Ambulatory Visit (INDEPENDENT_AMBULATORY_CARE_PROVIDER_SITE_OTHER): Payer: Medicare Other | Admitting: Internal Medicine

## 2019-04-01 ENCOUNTER — Other Ambulatory Visit: Payer: Self-pay

## 2019-04-01 ENCOUNTER — Other Ambulatory Visit (INDEPENDENT_AMBULATORY_CARE_PROVIDER_SITE_OTHER): Payer: Medicare Other

## 2019-04-01 VITALS — BP 142/82 | HR 114 | Temp 99.2°F | Resp 20 | Ht 69.0 in | Wt 166.0 lb

## 2019-04-01 DIAGNOSIS — R000 Tachycardia, unspecified: Secondary | ICD-10-CM | POA: Diagnosis not present

## 2019-04-01 DIAGNOSIS — I10 Essential (primary) hypertension: Secondary | ICD-10-CM | POA: Diagnosis not present

## 2019-04-01 DIAGNOSIS — D72829 Elevated white blood cell count, unspecified: Secondary | ICD-10-CM

## 2019-04-01 DIAGNOSIS — Z23 Encounter for immunization: Secondary | ICD-10-CM | POA: Diagnosis not present

## 2019-04-01 DIAGNOSIS — J449 Chronic obstructive pulmonary disease, unspecified: Secondary | ICD-10-CM | POA: Diagnosis not present

## 2019-04-01 DIAGNOSIS — I6523 Occlusion and stenosis of bilateral carotid arteries: Secondary | ICD-10-CM

## 2019-04-01 LAB — TSH: TSH: 0.91 u[IU]/mL (ref 0.35–4.50)

## 2019-04-01 LAB — CBC WITH DIFFERENTIAL/PLATELET
Basophils Absolute: 0.1 10*3/uL (ref 0.0–0.1)
Basophils Relative: 0.6 % (ref 0.0–3.0)
Eosinophils Absolute: 0.1 10*3/uL (ref 0.0–0.7)
Eosinophils Relative: 1.2 % (ref 0.0–5.0)
HCT: 53.5 % — ABNORMAL HIGH (ref 39.0–52.0)
Hemoglobin: 18.7 g/dL (ref 13.0–17.0)
Lymphocytes Relative: 17.3 % (ref 12.0–46.0)
Lymphs Abs: 1.8 10*3/uL (ref 0.7–4.0)
MCHC: 35 g/dL (ref 30.0–36.0)
MCV: 96.4 fl (ref 78.0–100.0)
Monocytes Absolute: 1.1 10*3/uL — ABNORMAL HIGH (ref 0.1–1.0)
Monocytes Relative: 10.2 % (ref 3.0–12.0)
Neutro Abs: 7.5 10*3/uL (ref 1.4–7.7)
Neutrophils Relative %: 70.7 % (ref 43.0–77.0)
Platelets: 240 10*3/uL (ref 150.0–400.0)
RBC: 5.55 Mil/uL (ref 4.22–5.81)
RDW: 13.6 % (ref 11.5–15.5)
WBC: 10.6 10*3/uL — ABNORMAL HIGH (ref 4.0–10.5)

## 2019-04-01 MED ORDER — ALBUTEROL SULFATE (2.5 MG/3ML) 0.083% IN NEBU: INHALATION_SOLUTION | RESPIRATORY_TRACT | 3 refills | Status: DC

## 2019-04-01 MED ORDER — TIOTROPIUM BROMIDE-OLODATEROL 2.5-2.5 MCG/ACT IN AERS: 2.0000 | INHALATION_SPRAY | Freq: Every day | RESPIRATORY_TRACT | 1 refills | Status: DC

## 2019-04-01 NOTE — Patient Instructions (Signed)

## 2019-04-01 NOTE — Telephone Encounter (Signed)
CRITICAL VALUE STICKER  CRITICAL VALUE:Hg 18.7  RECEIVER (on-site recipient of call):Jewels Langone,RN  DATE & TIME NOTIFIED:04/01/19 11:50   MESSENGER (representative from lab):karen/lab  MD NOTIFIED: thomas jones  TIME OF NOTIFICATION:04/01/19 11:51  RESPONSE: pending

## 2019-04-01 NOTE — Progress Notes (Signed)
Subjective:  Patient ID: [name], male    DOB: 1940/12/11  Age: 78 y.o. MRN: [id number]  CC: Hypertension and COPD   HPI DAVE JETT presents for f/up - He denies any recent episodes of coughing but he continues to struggle with shortness of breath and wheezing.  Unfortunately, he tells me he only he is only using his Stiolto Respimat about 3 times a week.  Outpatient Medications Prior to Visit  Medication Sig Dispense Refill   aspirin EC 81 MG EC tablet Take 1 tablet (81 mg total) by mouth daily. 30 tablet 0   hydrochlorothiazide (MICROZIDE) 12.5 MG capsule TAKE ONE CAPSULE BY MOUTH DAILY 90 capsule 3   rosuvastatin (CRESTOR) 40 MG tablet TAKE 1 TABLET(40 MG) BY MOUTH DAILY 90 tablet 3   albuterol (PROVENTIL) (2.5 MG/3ML) 0.083% nebulizer solution USE 1 VIAL VIA NEBULIZER EVERY 6 HOURS AS NEEDED FOR WHEEZING OR SHORTNESS OF BREATH 75 mL 3   Tiotropium Bromide-Olodaterol (STIOLTO RESPIMAT) 2.5-2.5 MCG/ACT AERS Inhale 2 puffs into the lungs daily. (Patient taking differently: Inhale 3 puffs into the lungs daily. ) 1 Inhaler 0   No facility-administered medications prior to visit.     ROS Review of Systems  Constitutional: Negative for chills, diaphoresis, fatigue and fever.  HENT: Negative.  Negative for sore throat and trouble swallowing.   Respiratory: Positive for shortness of breath and wheezing. Negative for cough, chest tightness and stridor.   Cardiovascular: Negative for chest pain, palpitations and leg swelling.  Gastrointestinal: Negative for abdominal pain, constipation, diarrhea, nausea and vomiting.  Genitourinary: Negative.  Negative for difficulty urinating.  Musculoskeletal: Negative.  Negative for arthralgias and myalgias.  Skin: Negative.  Negative for color change.  Neurological: Negative.  Negative for dizziness, weakness, light-headedness and headaches.  Hematological: Negative for adenopathy. Does not bruise/bleed easily.  Psychiatric/Behavioral:  Negative.     Objective:  BP (!) 142/82 (BP Location: Left Arm, Patient Position: Sitting, Cuff Size: Normal)    Pulse (!) 114    Temp 99.2 F (37.3 C) (Oral)    Resp 20    Ht 5\' 9"  (1.753 m)    Wt 166 lb (75.3 kg)    SpO2 94%    BMI 24.51 kg/m   BP Readings from Last 3 Encounters:  04/01/19 (!) 142/82  02/16/18 (!) 152/84  02/17/17 140/72    Wt Readings from Last 3 Encounters:  04/01/19 166 lb (75.3 kg)  03/09/19 167 lb (75.8 kg)  02/16/18 163 lb 9.6 oz (74.2 kg)    Physical Exam Vitals signs reviewed.  Constitutional:      General: He is not in acute distress.    Appearance: He is not ill-appearing, toxic-appearing or diaphoretic.  HENT:     Head:     Comments: He has an erythematous, ruddy appearance of his face and conjunctiva    Nose: Nose normal.     Mouth/Throat:     Mouth: Mucous membranes are moist.  Eyes:     General: No scleral icterus.    Conjunctiva/sclera: Conjunctivae normal.  Neck:     Musculoskeletal: Normal range of motion. No neck rigidity.  Cardiovascular:     Rate and Rhythm: Tachycardia present. Frequent extrasystoles are present.    Chest Wall: PMI is not displaced.     Heart sounds: Normal heart sounds, S1 normal and S2 normal. No murmur. No systolic murmur. No diastolic murmur. No gallop.      Comments: EKG ---  Sinus  Tachycardia  -With  rate variation  cv = 14. -Left axis -anterior fascicular block.   -Poor R-wave progression -nonspecific -consider old anterior infarct.   ABNORMAL - no change compared to the prior EKG in 2019 Pulmonary:     Effort: Tachypnea present. No respiratory distress.     Breath sounds: No stridor. Examination of the right-middle field reveals wheezing and rhonchi. Examination of the left-middle field reveals wheezing and rhonchi. Examination of the right-lower field reveals wheezing and rhonchi. Examination of the left-lower field reveals wheezing and rhonchi. Wheezing and rhonchi present. No rales.  Abdominal:      General: Abdomen is flat.     Palpations: There is no hepatomegaly, splenomegaly or mass.     Tenderness: There is no abdominal tenderness.  Musculoskeletal: Normal range of motion.        General: No swelling.     Right lower leg: No edema.     Left lower leg: No edema.  Lymphadenopathy:     Cervical: No cervical adenopathy.  Skin:    General: Skin is warm.     Findings: No rash.  Neurological:     General: No focal deficit present.     Mental Status: He is alert.  Psychiatric:        Mood and Affect: Mood normal.        Behavior: Behavior normal.     Lab Results  Component Value Date   WBC 10.6 (H) 04/01/2019   HGB 18.7 Repeated and verified X2. (HH) 04/01/2019   HCT 53.5 (H) 04/01/2019   PLT 240.0 04/01/2019   GLUCOSE 123 (H) 03/18/2019   CHOL 175 03/18/2019   TRIG 237 (H) 03/18/2019   HDL 51 03/18/2019   LDLDIRECT 77.7 04/12/2008   LDLCALC 77 03/18/2019   ALT 22 03/18/2019   AST 29 03/18/2019   NA 138 03/18/2019   K 3.6 03/18/2019   CL 92 (L) 03/18/2019   CREATININE 1.13 03/18/2019   BUN 16 03/18/2019   CO2 30 (H) 03/18/2019   TSH 0.91 04/01/2019    Vas US Carotid  Result Date: 12/09/2018 Carotid Arterial Duplex Study Indications:       Carotid artery disease and Patient denies any cerebrovascular                    symptoms. Limitations:       Heavy respiration. Comparison Study:  In 11/2017, a carotid duplex showed an occluded right ICA and                    a LICA velocity of 147/47 cm/s. Performing Technologist: Dondra Prader RVT  Examination Guidelines: A complete evaluation includes B-mode imaging, spectral Doppler, color Doppler, and power Doppler as needed of all accessible portions of each vessel. Bilateral testing is considered an integral part of a complete examination. Limited examinations for reoccurring indications may be performed as noted.  Right Carotid Findings: +----------+--------+--------+--------+------------+--------+             PSV cm/s EDV  cm/s Stenosis Describe     Comments  +----------+--------+--------+--------+------------+--------+  CCA Prox   47       11                                       +----------+--------+--------+--------+------------+--------+  CCA Distal 53       11                                       +----------+--------+--------+--------+------------+--------+  ICA Prox                     Occluded heterogenous           +----------+--------+--------+--------+------------+--------+  ICA Mid                      Occluded                        +----------+--------+--------+--------+------------+--------+  ICA Distal                   Occluded                        +----------+--------+--------+--------+------------+--------+  ECA        80       11                heterogenous           +----------+--------+--------+--------+------------+--------+ +----------+--------+-------+----------------+-------------------+             PSV cm/s EDV cms Describe         Arm Pressure (mmHG)  +----------+--------+-------+----------------+-------------------+  Subclavian 77               Multiphasic, WNL 140                  +----------+--------+-------+----------------+-------------------+ +---------+--------+--+--------+-+---------+  Vertebral PSV cm/s 28 EDV cm/s 5 Antegrade  +---------+--------+--+--------+-+---------+  Left Carotid Findings: +----------+--------+--------+--------+------------+--------+             PSV cm/s EDV cm/s Stenosis Describe     Comments  +----------+--------+--------+--------+------------+--------+  CCA Prox   75       18                                       +----------+--------+--------+--------+------------+--------+  CCA Distal 91       25                                       +----------+--------+--------+--------+------------+--------+  ICA Prox   141      45       40-59%   heterogenous           +----------+--------+--------+--------+------------+--------+  ICA Mid    87       36                                        +----------+--------+--------+--------+------------+--------+  ICA Distal 101      38                                       +----------+--------+--------+--------+------------+--------+  ECA        163      31                heterogenous           +----------+--------+--------+--------+------------+--------+ +----------+--------+--------+--------+-------------------+  Subclavian PSV cm/s EDV cm/s Describe Arm Pressure (mmHG)  +----------+--------+--------+--------+-------------------+             155  Stenotic 140                  +----------+--------+--------+--------+-------------------+ +---------+--------+--+--------+--+---------+  Vertebral PSV cm/s 33 EDV cm/s 12 Antegrade  +---------+--------+--+--------+--+---------+  Summary: Right Carotid: Evidence consistent with a total occlusion of the right ICA. Left Carotid: Velocities in the left ICA are consistent with a 40-59% stenosis. Vertebrals:  Bilateral vertebral arteries demonstrate antegrade flow. Subclavians: Normal flow hemodynamics were seen in bilateral subclavian              arteries. *See table(s) above for measurements and observations. Suggest follow up study in 12 months. Electronically signed by Lance Muss MD on 12/09/2018 at 4:34:10 PM.    Final     Assessment & Plan:   Eulice was seen today for hypertension and copd.  Diagnoses and all orders for this visit:  Essential hypertension, benign- His blood pressure is adequately well controlled.  His EKG is negative for LVH or ischemia. -     TSH; Future -     EKG 12-Lead  Leukocytosis, unspecified type- His white cell count remains mildly elevated.  He also has mild erythrocytosis.  This is all consistent with COPD.  His other cell lines are normal and his platelet count is normal so I do not think he has lymphoproliferative disease. -     CBC with Differential/Platelet; Future  COPD III/ hypercarbic- This is not well controlled due to noncompliance.  I have  asked him to use the Stiolto Respimat consistently every day. -     Tiotropium Bromide-Olodaterol (STIOLTO RESPIMAT) 2.5-2.5 MCG/ACT AERS; Inhale 2 puffs into the lungs daily. -     albuterol (PROVENTIL) (2.5 MG/3ML) 0.083% nebulizer solution; USE 1 VIAL VIA NEBULIZER EVERY 6 HOURS AS NEEDED FOR WHEEZING OR SHORTNESS OF BREATH  Tachycardia- This is consistent with poorly controlled COPD. -     EKG 12-Lead  Need for Tdap vaccination -     Tdap vaccine greater than or equal to 7yo IM  Need for pneumococcal vaccination -     Pneumococcal polysaccharide vaccine 23-valent greater than or equal to 2yo subcutaneous/IM   I am having Francis Dowse maintain his aspirin, rosuvastatin, hydrochlorothiazide, Tiotropium Bromide-Olodaterol, and albuterol.  Meds ordered this encounter  Medications   Tiotropium Bromide-Olodaterol (STIOLTO RESPIMAT) 2.5-2.5 MCG/ACT AERS    Sig: Inhale 2 puffs into the lungs daily.    Dispense:  3 Inhaler    Refill:  1    Order Specific Question:   Lot Number?    Answer:   [id number]    Order Specific Question:   Expiration Date?    Answer:   05/28/2017    Order Specific Question:   Quantity    Answer:   1   albuterol (PROVENTIL) (2.5 MG/3ML) 0.083% nebulizer solution    Sig: USE 1 VIAL VIA NEBULIZER EVERY 6 HOURS AS NEEDED FOR WHEEZING OR SHORTNESS OF BREATH    Dispense:  75 mL    Refill:  3     Follow-up: Return in about 6 months (around 10/01/2019).  Sanda Linger, MD

## 2019-04-15 ENCOUNTER — Other Ambulatory Visit: Payer: Self-pay | Admitting: Cardiology

## 2019-05-07 ENCOUNTER — Telehealth: Payer: Self-pay | Admitting: Internal Medicine

## 2019-05-07 NOTE — Telephone Encounter (Signed)
Spoke with [name] she stated [name] will give office a call back to schedule AWV. SF

## 2019-12-08 ENCOUNTER — Other Ambulatory Visit: Payer: Self-pay

## 2019-12-08 ENCOUNTER — Encounter: Payer: Self-pay | Admitting: *Deleted

## 2019-12-08 ENCOUNTER — Other Ambulatory Visit: Payer: Self-pay | Admitting: *Deleted

## 2019-12-08 ENCOUNTER — Ambulatory Visit (HOSPITAL_COMMUNITY)
Admission: RE | Admit: 2019-12-08 | Discharge: 2019-12-08 | Disposition: A | Discharge status: Home / Self Care | Payer: Medicare Other | Source: Ambulatory Visit | Attending: Cardiovascular Disease | Admitting: Cardiovascular Disease

## 2019-12-08 ENCOUNTER — Encounter (INDEPENDENT_AMBULATORY_CARE_PROVIDER_SITE_OTHER): Payer: Self-pay

## 2019-12-08 DIAGNOSIS — I679 Cerebrovascular disease, unspecified: Secondary | ICD-10-CM | POA: Diagnosis not present

## 2019-12-08 DIAGNOSIS — I6523 Occlusion and stenosis of bilateral carotid arteries: Secondary | ICD-10-CM | POA: Diagnosis not present

## 2020-03-17 ENCOUNTER — Other Ambulatory Visit: Payer: Self-pay | Admitting: Internal Medicine

## 2020-03-17 DIAGNOSIS — J449 Chronic obstructive pulmonary disease, unspecified: Secondary | ICD-10-CM

## 2020-04-03 ENCOUNTER — Other Ambulatory Visit: Payer: Self-pay | Admitting: Cardiology

## 2020-04-11 NOTE — Progress Notes (Signed)
HPI: FU coronary artery disease. His last cardiac catheterization on August 04, 2006, showed an ejection fraction of 65%. The distal LAD had a 50% stenosis at the origin of the diagonal. The right coronary artery stent was patent. There is a 20% in-stent restenosis. Nuclear study in January of 2014 showed an ejection fraction of 74% and no ischemia. Note an abdominal ultrasound in May 2005 showed no aneurysm.Last echocardiogram March 2018 showed normal LV function and grade 2 diastolic dysfunction. Carotid Dopplers February 2021 showed 40-59% left and totally occluded right. Follow-up study recommended 1 year. Since I last saw him,patient denies dyspnea, chest pain, palpitations or syncope.  Current Outpatient Medications  Medication Sig Dispense Refill   albuterol (PROVENTIL) (2.5 MG/3ML) 0.083% nebulizer solution USE 1 VIAL VIA NEBULIZER EVERY 6 HOURS AS NEEDED FOR WHEEZING/ SHORTNESS OF BREATH 75 mL 3   aspirin EC 81 MG EC tablet Take 1 tablet (81 mg total) by mouth daily. 30 tablet 0   hydrochlorothiazide (MICROZIDE) 12.5 MG capsule TAKE ONE CAPSULE BY MOUTH DAILY 90 capsule 3   rosuvastatin (CRESTOR) 40 MG tablet TAKE 1 TABLET(40 MG) BY MOUTH DAILY 90 tablet 3   Tiotropium Bromide-Olodaterol (STIOLTO RESPIMAT) 2.5-2.5 MCG/ACT AERS Inhale 2 puffs into the lungs daily. 3 Inhaler 1   No current facility-administered medications for this visit.     Past Medical History:  Diagnosis Date   Asthma    as a child   Carotid arterial disease ([place])    a. 10/6107 Carotid U/S: LICA 60-45%, RICA 409 CTO, nl subclavian arteries--f/u 1 yr.   Coronary atherosclerosis of native coronary artery    a. 09/2001 inflat MI/PCI: RCA 33m (3.0x18 A[address] S7 BMS); b. 07/2006 Cath: LM nl, LAD 50p, LCX min irregs, OM1 large, min irregs, OM2 min irregs, RCA 20 ISR;  c. 10/2012 MV: EF 74%, no ischemia.   Essential hypertension    Heart murmur    since a child   Hyperlipidemia    Myocardial  infarction [facility]) 2002   Pneumonia    age 22   Right inguinal hernia     Past Surgical History:  Procedure Laterality Date   CARDIAC CATHETERIZATION     Carotid stents     COLONOSCOPY     FRACTURE SURGERY     HERNIA REPAIR     ventral   INGUINAL HERNIA REPAIR Right 11/01/2016   Procedure: RIGHT INGUINAL HERNIA REPAIR WITH MESH;  Surgeon: Jackolyn Confer, MD;  Location: [facility];  Service: General;  Laterality: Right;   INSERTION OF MESH Right 11/01/2016   Procedure: INSERTION OF MESH;  Surgeon: Jackolyn Confer, MD;  Location: [facility];  Service: General;  Laterality: Right;   ORIF SHOULDER FRACTURE Right     Social History   Socioeconomic History   Marital status: Married    Spouse name: Not on file   Number of children: Not on file   Years of education: Not on file   Highest education level: Not on file  Occupational History    Employer: RETIRED  Tobacco Use   Smoking status: Former Smoker    Packs/day: 1.00    Years: 44.00    Pack years: 44.00    Types: Cigarettes    Quit date: 10/28/1998    Years since quitting: 21.4   Smokeless tobacco: Former Systems developer    Types: Chew   Tobacco comment: quit chewing tobacco 2001 ish  Substance and Sexual Activity   Alcohol use: No  Alcohol/week: 0.0 standard drinks   Drug use: No   Sexual activity: Not on file  Other Topics Concern   Not on file  Social History Narrative   Lives in [place] with his wife.   Social Determinants of Health   Financial Resource Strain:    Difficulty of Paying Living Expenses:   Food Insecurity:    Worried About Programme researcher, broadcasting/film/video in the Last Year:    Barista in the Last Year:   Transportation Needs:    Freight forwarder (Medical):    Lack of Transportation (Non-Medical):   Physical Activity:    Days of Exercise per Week:    Minutes of Exercise per Session:   Stress:    Feeling of Stress :   Social Connections:    Frequency of Communication with  Friends and Family:    Frequency of Social Gatherings with Friends and Family:    Attends Religious Services:    Active Member of Clubs or Organizations:    Attends Engineer, structural:    Marital Status:   Intimate Partner Violence:    Fear of Current or Ex-Partner:    Emotionally Abused:    Physically Abused:    Sexually Abused:     Family History  Problem Relation Age of Onset   Hypertension Other     ROS: no fevers or chills, productive cough, hemoptysis, dysphasia, odynophagia, melena, hematochezia, dysuria, hematuria, rash, seizure activity, orthopnea, PND, pedal edema, claudication. Remaining systems are negative.  Physical Exam: Well-developed well-nourished in no acute distress.  Skin is warm and dry.  HEENT is normal.  Neck is supple.  Chest diffuse mild expiratory wheeze Cardiovascular exam is regular rate and rhythm.  Abdominal exam nontender or distended. No masses palpated. Extremities show no edema. neuro grossly intact  ECG-sinus tachycardia with occasional PVC and PAC, left anterior fascicular block, cannot rule out septal infarct.  Personally reviewed  A/P  1 CAD-no CP; continue medical therapy with ASA and statin.  2 Carotid artery disease-FU carotid dopplers 2/22.  3 Hyperlipidemia-continue statin; check lipids and liver.  4 hypertension-BP controlled; continue present meds and follow.  Check potassium and renal function.  Olga Millers, MD

## 2020-04-18 ENCOUNTER — Other Ambulatory Visit: Payer: Self-pay

## 2020-04-18 ENCOUNTER — Encounter: Payer: Self-pay | Admitting: Cardiology

## 2020-04-18 ENCOUNTER — Ambulatory Visit (INDEPENDENT_AMBULATORY_CARE_PROVIDER_SITE_OTHER): Payer: Medicare Other | Admitting: Cardiology

## 2020-04-18 VITALS — BP 130/80 | HR 107 | Temp 98.4°F | Ht 69.0 in | Wt 160.8 lb

## 2020-04-18 DIAGNOSIS — I2510 Atherosclerotic heart disease of native coronary artery without angina pectoris: Secondary | ICD-10-CM

## 2020-04-18 DIAGNOSIS — E7800 Pure hypercholesterolemia, unspecified: Secondary | ICD-10-CM

## 2020-04-18 DIAGNOSIS — I679 Cerebrovascular disease, unspecified: Secondary | ICD-10-CM

## 2020-04-18 DIAGNOSIS — I10 Essential (primary) hypertension: Secondary | ICD-10-CM | POA: Diagnosis not present

## 2020-04-18 LAB — COMPREHENSIVE METABOLIC PANEL
ALT: 16 IU/L (ref 0–44)
AST: 25 IU/L (ref 0–40)
Albumin/Globulin Ratio: 1.6 (ref 1.2–2.2)
Albumin: 4.4 g/dL (ref 3.7–4.7)
Alkaline Phosphatase: 70 IU/L (ref 48–121)
BUN/Creatinine Ratio: 15 (ref 10–24)
BUN: 18 mg/dL (ref 8–27)
Bilirubin Total: 0.3 mg/dL (ref 0.0–1.2)
CO2: 30 mmol/L — ABNORMAL HIGH (ref 20–29)
Calcium: 9.7 mg/dL (ref 8.6–10.2)
Chloride: 91 mmol/L — ABNORMAL LOW (ref 96–106)
Creatinine, Ser: 1.22 mg/dL (ref 0.76–1.27)
GFR calc Af Amer: 64 mL/min/{1.73_m2} (ref >59)
GFR calc non Af Amer: 56 mL/min/{1.73_m2} — ABNORMAL LOW (ref >59)
Globulin, Total: 2.7 g/dL (ref 1.5–4.5)
Glucose: 112 mg/dL — ABNORMAL HIGH (ref 65–99)
Potassium: 3.3 mmol/L — ABNORMAL LOW (ref 3.5–5.2)
Sodium: 137 mmol/L (ref 134–144)
Total Protein: 7.1 g/dL (ref 6.0–8.5)

## 2020-04-18 LAB — LIPID PANEL
Chol/HDL Ratio: 3.5 ratio (ref 0.0–5.0)
Cholesterol, Total: 161 mg/dL (ref 100–199)
HDL: 46 mg/dL (ref >39)
LDL Chol Calc (NIH): 81 mg/dL (ref 0–99)
Triglycerides: 205 mg/dL — ABNORMAL HIGH (ref 0–149)
VLDL Cholesterol Cal: 34 mg/dL (ref 5–40)

## 2020-04-18 NOTE — Patient Instructions (Signed)
Medication Instructions:  NO CHANGE *If you need a refill on your cardiac medications before your next appointment, please call your pharmacy*   Lab Work: Your physician recommends that you HAVE LAB WORK TODAY  If you have labs (blood work) drawn today and your tests are completely normal, you will receive your results only by: . MyChart Message (if you have MyChart) OR . A paper copy in the mail If you have any lab test that is abnormal or we need to change your treatment, we will call you to review the results.   Follow-Up: At [facility], you and your health needs are our priority.  As part of our continuing mission to provide you with exceptional heart care, we have created designated Provider Care Teams.  These Care Teams include your primary Cardiologist (physician) and Advanced Practice Providers (APPs -  Physician Assistants and Nurse Practitioners) who all work together to provide you with the care you need, when you need it.  We recommend signing up for the patient portal called "MyChart".  Sign up information is provided on this After Visit Summary.  MyChart is used to connect with patients for Virtual Visits (Telemedicine).  Patients are able to view lab/test results, encounter notes, upcoming appointments, etc.  Non-urgent messages can be sent to your provider as well.   To learn more about what you can do with MyChart, go to [email or web address].    Your next appointment:   12 month(s)  The format for your next appointment:   Either In Person or Virtual  Provider:   You may see BRIAN CRENSHAW MD or one of the following Advanced Practice Providers on your designated Care Team:    Luke Kilroy, PA-C  Callie Goodrich, PA-C  Jesse Cleaver, FNP     

## 2020-04-19 ENCOUNTER — Telehealth: Payer: Self-pay | Admitting: *Deleted

## 2020-04-19 DIAGNOSIS — E7800 Pure hypercholesterolemia, unspecified: Secondary | ICD-10-CM

## 2020-04-19 DIAGNOSIS — E876 Hypokalemia: Secondary | ICD-10-CM

## 2020-04-19 MED ORDER — POTASSIUM CHLORIDE CRYS ER 20 MEQ PO TBCR: 20.0000 meq | EXTENDED_RELEASE_TABLET | Freq: Every day | ORAL | 3 refills | Status: DC

## 2020-04-19 MED ORDER — EZETIMIBE 10 MG PO TABS: 10.0000 mg | ORAL_TABLET | Freq: Every day | ORAL | 3 refills | Status: DC

## 2020-04-19 NOTE — Telephone Encounter (Signed)
Follow up ° ° °Patient is returning call per the previous message. Please call. °

## 2020-04-19 NOTE — Telephone Encounter (Addendum)
-----   Message from Lewayne Bunting, MD sent at 04/18/2020  5:35 PM EDT ----- Add zetia 10 mg daily; lipids and liver 12 weeks Add Kdur 20 meq daily; bmet one week Olga Millers  Left message for pt to call

## 2020-04-19 NOTE — Telephone Encounter (Signed)
Spoke with [name], Aware of dr crenshaw's recommendations. New script sent to the pharmacy AND Lab orders mailed to the [name]  

## 2020-04-27 LAB — BASIC METABOLIC PANEL
BUN/Creatinine Ratio: 15 (ref 10–24)
BUN: 19 mg/dL (ref 8–27)
CO2: 31 mmol/L — ABNORMAL HIGH (ref 20–29)
Calcium: 10 mg/dL (ref 8.6–10.2)
Chloride: 96 mmol/L (ref 96–106)
Creatinine, Ser: 1.24 mg/dL (ref 0.76–1.27)
GFR calc Af Amer: 63 mL/min/{1.73_m2} (ref >59)
GFR calc non Af Amer: 55 mL/min/{1.73_m2} — ABNORMAL LOW (ref >59)
Glucose: 119 mg/dL — ABNORMAL HIGH (ref 65–99)
Potassium: 3.9 mmol/L (ref 3.5–5.2)
Sodium: 142 mmol/L (ref 134–144)

## 2020-04-28 ENCOUNTER — Encounter: Payer: Self-pay | Admitting: *Deleted

## 2020-06-04 ENCOUNTER — Other Ambulatory Visit: Payer: Self-pay

## 2020-06-04 ENCOUNTER — Emergency Department (HOSPITAL_COMMUNITY): Payer: Medicare Other

## 2020-06-04 ENCOUNTER — Encounter (HOSPITAL_COMMUNITY): Payer: Self-pay

## 2020-06-04 ENCOUNTER — Emergency Department (HOSPITAL_COMMUNITY)
Admission: EM | Admit: 2020-06-04 | Discharge: 2020-06-04 | Discharge status: Against Medical Advice | Payer: Medicare Other | Attending: Emergency Medicine | Admitting: Emergency Medicine

## 2020-06-04 DIAGNOSIS — R1110 Vomiting, unspecified: Secondary | ICD-10-CM | POA: Diagnosis not present

## 2020-06-04 DIAGNOSIS — Z87891 Personal history of nicotine dependence: Secondary | ICD-10-CM | POA: Diagnosis not present

## 2020-06-04 DIAGNOSIS — R079 Chest pain, unspecified: Secondary | ICD-10-CM | POA: Insufficient documentation

## 2020-06-04 DIAGNOSIS — Z20822 Contact with and (suspected) exposure to covid-19: Secondary | ICD-10-CM | POA: Diagnosis not present

## 2020-06-04 DIAGNOSIS — R52 Pain, unspecified: Secondary | ICD-10-CM | POA: Diagnosis not present

## 2020-06-04 DIAGNOSIS — J441 Chronic obstructive pulmonary disease with (acute) exacerbation: Secondary | ICD-10-CM | POA: Diagnosis not present

## 2020-06-04 DIAGNOSIS — Z7982 Long term (current) use of aspirin: Secondary | ICD-10-CM | POA: Insufficient documentation

## 2020-06-04 DIAGNOSIS — R1084 Generalized abdominal pain: Secondary | ICD-10-CM | POA: Diagnosis not present

## 2020-06-04 DIAGNOSIS — J80 Acute respiratory distress syndrome: Secondary | ICD-10-CM | POA: Diagnosis not present

## 2020-06-04 DIAGNOSIS — I10 Essential (primary) hypertension: Secondary | ICD-10-CM | POA: Insufficient documentation

## 2020-06-04 DIAGNOSIS — Z79899 Other long term (current) drug therapy: Secondary | ICD-10-CM | POA: Insufficient documentation

## 2020-06-04 DIAGNOSIS — R0602 Shortness of breath: Secondary | ICD-10-CM | POA: Diagnosis not present

## 2020-06-04 DIAGNOSIS — R000 Tachycardia, unspecified: Secondary | ICD-10-CM | POA: Diagnosis not present

## 2020-06-04 DIAGNOSIS — I2510 Atherosclerotic heart disease of native coronary artery without angina pectoris: Secondary | ICD-10-CM | POA: Insufficient documentation

## 2020-06-04 DIAGNOSIS — I491 Atrial premature depolarization: Secondary | ICD-10-CM | POA: Diagnosis not present

## 2020-06-04 DIAGNOSIS — R1031 Right lower quadrant pain: Secondary | ICD-10-CM | POA: Diagnosis not present

## 2020-06-04 LAB — CBC WITH DIFFERENTIAL/PLATELET
Abs Immature Granulocytes: 0.13 10*3/uL — ABNORMAL HIGH (ref 0.00–0.07)
Basophils Absolute: 0 10*3/uL (ref 0.0–0.1)
Basophils Relative: 0 %
Eosinophils Absolute: 0 10*3/uL (ref 0.0–0.5)
Eosinophils Relative: 0 %
HCT: 52.2 % — ABNORMAL HIGH (ref 39.0–52.0)
Hemoglobin: 17.7 g/dL — ABNORMAL HIGH (ref 13.0–17.0)
Immature Granulocytes: 1 %
Lymphocytes Relative: 5 %
Lymphs Abs: 0.9 10*3/uL (ref 0.7–4.0)
MCH: 32.6 pg (ref 26.0–34.0)
MCHC: 33.9 g/dL (ref 30.0–36.0)
MCV: 96.1 fL (ref 80.0–100.0)
Monocytes Absolute: 1.4 10*3/uL — ABNORMAL HIGH (ref 0.1–1.0)
Monocytes Relative: 8 %
Neutro Abs: 15.2 10*3/uL — ABNORMAL HIGH (ref 1.7–7.7)
Neutrophils Relative %: 86 %
Platelets: 213 10*3/uL (ref 150–400)
RBC: 5.43 MIL/uL (ref 4.22–5.81)
RDW: 12.8 % (ref 11.5–15.5)
WBC: 17.6 10*3/uL — ABNORMAL HIGH (ref 4.0–10.5)
nRBC: 0 % (ref 0.0–0.2)

## 2020-06-04 LAB — COMPREHENSIVE METABOLIC PANEL
ALT: 78 U/L — ABNORMAL HIGH (ref 0–44)
AST: 73 U/L — ABNORMAL HIGH (ref 15–41)
Albumin: 3.7 g/dL (ref 3.5–5.0)
Alkaline Phosphatase: 60 U/L (ref 38–126)
Anion gap: 15 (ref 5–15)
BUN: 17 mg/dL (ref 8–23)
CO2: 29 mmol/L (ref 22–32)
Calcium: 10.2 mg/dL (ref 8.9–10.3)
Chloride: 92 mmol/L — ABNORMAL LOW (ref 98–111)
Creatinine, Ser: 1.35 mg/dL — ABNORMAL HIGH (ref 0.61–1.24)
GFR calc Af Amer: 57 mL/min — ABNORMAL LOW (ref >60)
GFR calc non Af Amer: 49 mL/min — ABNORMAL LOW (ref >60)
Glucose, Bld: 167 mg/dL — ABNORMAL HIGH (ref 70–99)
Potassium: 4.1 mmol/L (ref 3.5–5.1)
Sodium: 136 mmol/L (ref 135–145)
Total Bilirubin: 0.8 mg/dL (ref 0.3–1.2)
Total Protein: 7.1 g/dL (ref 6.5–8.1)

## 2020-06-04 LAB — TROPONIN I (HIGH SENSITIVITY)
Troponin I (High Sensitivity): 11 ng/L (ref <18)
Troponin I (High Sensitivity): 13 ng/L (ref <18)

## 2020-06-04 LAB — SARS CORONAVIRUS 2 BY RT PCR (HOSPITAL ORDER, PERFORMED IN ~~LOC~~ HOSPITAL LAB): SARS Coronavirus 2: NEGATIVE

## 2020-06-04 LAB — D-DIMER, QUANTITATIVE: D-Dimer, Quant: 2.52 ug/mL-FEU — ABNORMAL HIGH (ref 0.00–0.50)

## 2020-06-04 LAB — LIPASE, BLOOD: Lipase: 29 U/L (ref 11–51)

## 2020-06-04 MED ORDER — MAGNESIUM SULFATE 2 GM/50ML IV SOLN
2.0000 g | Freq: Once | INTRAVENOUS | Status: AC
  Administered 2020-06-04: 2 g via INTRAVENOUS
  Filled 2020-06-04: qty 50

## 2020-06-04 MED ORDER — AZITHROMYCIN 250 MG PO TABS
250.0000 mg | ORAL_TABLET | Freq: Every day | ORAL | 0 refills | Status: DC
Start: 2020-06-04 — End: 2021-10-04

## 2020-06-04 MED ORDER — ALBUTEROL SULFATE HFA 108 (90 BASE) MCG/ACT IN AERS
8.0000 | INHALATION_SPRAY | Freq: Once | RESPIRATORY_TRACT | Status: AC
  Administered 2020-06-04: 8 via RESPIRATORY_TRACT
  Filled 2020-06-04: qty 6.7

## 2020-06-04 MED ORDER — METHYLPREDNISOLONE SODIUM SUCC 125 MG IJ SOLR
125.0000 mg | Freq: Once | INTRAMUSCULAR | Status: AC
  Administered 2020-06-04: 125 mg via INTRAVENOUS
  Filled 2020-06-04: qty 2

## 2020-06-04 MED ORDER — PREDNISONE 20 MG PO TABS: 40.0000 mg | ORAL_TABLET | Freq: Every day | ORAL | 0 refills | Status: AC

## 2020-06-04 MED ORDER — IOHEXOL 350 MG/ML SOLN
75.0000 mL | Freq: Once | INTRAVENOUS | Status: AC | PRN
  Administered 2020-06-04: 41 mL via INTRAVENOUS

## 2020-06-04 NOTE — ED Notes (Signed)
Pt placed on nasal 02 at 2 for sats 88%

## 2020-06-04 NOTE — Discharge Instructions (Addendum)
Continue taking home medications as prescribed. It is very important that you take your daily inhalers every day.  Take the prednisone as prescribed.  Use your albuterol inhaler every 4 hours for the next 2 days. After this, use as needed for shortness of breath.  Take the antibiotics as prescribed to help treat your copd flair.  Call the lung doctor listed below to set up a follow up appointment.  Return to the ER with any new, worsening, or concerning symptoms.

## 2020-06-04 NOTE — ED Provider Notes (Signed)
[facility] EMERGENCY DEPARTMENT Provider Note   CSN: [id number] Arrival date & time: 06/04/20  1454     History Chief Complaint  Patient presents with  . Shortness of Breath    [name] is a 79 y.o. male presenting for evaluation of sob.   Pt states yesterday he had nausea nd r sided abd pain. Today he vomited and was more sob than normal. This improved with a breathing tx with EMS. Pt reports no complaints upon arrival to the ED. Per son, pt had multiple emesis today was had RUQ/R lower lob pain. Pt was visible more son than normal. Per son, pt continues to smoke cigarettes, and is not using inhalers as prescribed. Per son, pt has been told he needs O2, but does not use it. Pt denies fevers, CP, cough, abd pain, urinary sxs, abnormal BMs. He has not received his covid vaccines.   Additional history obtained chart review.  Patient with a history of CAD, hypertension, hyperlipidemia, previous MI status post stent placement, COPD.   HPI     Past Medical History:  Diagnosis Date  . Asthma    as a child  . Carotid arterial disease (HCC)    a. 11/2015 Carotid U/S: LICA 40-59%, RICA 100 CTO, nl subclavian arteries--f/u 1 yr.  . Coronary atherosclerosis of native coronary artery    a. 09/2001 inflat MI/PCI: RCA 65m (3.0x18 AVE S7 BMS); b. 07/2006 Cath: LM nl, LAD 50p, LCX min irregs, OM1 large, min irregs, OM2 min irregs, RCA 20 ISR;  c. 10/2012 MV: EF 74%, no ischemia.  . Essential hypertension   . Heart murmur    since a child  . Hyperlipidemia   . Myocardial infarction (HCC) 2002  . Pneumonia    age 26  . Right inguinal hernia     Patient Active Problem List   Diagnosis Date Noted  . Leukocytosis 04/01/2019  . Carotid stenosis, asymptomatic, bilateral 12/08/2017  . Demand ischemia of myocardium (HCC)   . Hyperlipidemia   . COPD III/ hypercarbic 10/07/2016  . Chronic respiratory failure with hypercapnia (HCC) 10/07/2016  . Pure hyperglyceridemia  09/04/2016  . Moderate persistent asthma without complication 09/04/2016  . Essential hypertension, benign 10/10/2009  . Hyperlipidemia LDL goal <70 10/07/2008  . CORONARY ATHEROSCLEROSIS NATIVE CORONARY ARTERY 10/07/2008  . Cerebrovascular disease 10/07/2008    Past Surgical History:  Procedure Laterality Date  . CARDIAC CATHETERIZATION    . Carotid stents    . COLONOSCOPY    . FRACTURE SURGERY    . HERNIA REPAIR     ventral  . INGUINAL HERNIA REPAIR Right 11/01/2016   Procedure: RIGHT INGUINAL HERNIA REPAIR WITH MESH;  Surgeon: Avel Peace, MD;  Location: [facility] OR;  Service: General;  Laterality: Right;  . INSERTION OF MESH Right 11/01/2016   Procedure: INSERTION OF MESH;  Surgeon: Avel Peace, MD;  Location: [facility] OR;  Service: General;  Laterality: Right;  . ORIF SHOULDER FRACTURE Right        Family History  Problem Relation Age of Onset  . Hypertension Other     Social History   Tobacco Use  . Smoking status: Former Smoker    Packs/day: 1.00    Years: 44.00    Pack years: 44.00    Types: Cigarettes    Quit date: 10/28/1998    Years since quitting: 21.6  . Smokeless tobacco: Former Neurosurgeon    Types: Chew  . Tobacco comment: quit chewing tobacco 2001 ish  Substance  Use Topics  . Alcohol use: No    Alcohol/week: 0.0 standard drinks  . Drug use: No    Home Medications Prior to Admission medications   Medication Sig Start Date End Date Taking? Authorizing Provider  albuterol (PROVENTIL) (2.5 MG/3ML) 0.083% nebulizer solution USE 1 VIAL VIA NEBULIZER EVERY 6 HOURS AS NEEDED FOR WHEEZING/ SHORTNESS OF BREATH 03/17/20   Etta Grandchild, MD  aspirin EC 81 MG EC tablet Take 1 tablet (81 mg total) by mouth daily. 01/25/17   Regalado, Belkys A, MD  azithromycin (ZITHROMAX) 250 MG tablet Take 1 tablet (250 mg total) by mouth daily. Take first 2 tablets together, then 1 every day until finished. 06/04/20   Anna-Marie Coller, PA-C  ezetimibe (ZETIA) 10 MG tablet Take 1 tablet (10  mg total) by mouth daily. 04/19/20 07/18/20  Lewayne Bunting, MD  hydrochlorothiazide (MICROZIDE) 12.5 MG capsule TAKE ONE CAPSULE BY MOUTH DAILY 04/05/20   Lewayne Bunting, MD  potassium chloride SA (KLOR-CON) 20 MEQ tablet Take 1 tablet (20 mEq total) by mouth daily. 04/19/20   Lewayne Bunting, MD  predniSONE (DELTASONE) 20 MG tablet Take 2 tablets (40 mg total) by mouth daily for 5 days. 06/04/20 06/09/20  Halayna Blane, PA-C  rosuvastatin (CRESTOR) 40 MG tablet TAKE 1 TABLET(40 MG) BY MOUTH DAILY 04/05/20   Lewayne Bunting, MD  Tiotropium Bromide-Olodaterol (STIOLTO RESPIMAT) 2.5-2.5 MCG/ACT AERS Inhale 2 puffs into the lungs daily. 04/01/19   Etta Grandchild, MD  albuterol (PROVENTIL) (2.5 MG/3ML) 0.083% nebulizer solution USE 1 VIAL VIA NEBULIZER EVERY 6 HOURS AS NEEDED FOR WHEEZING OR SHORTNESS OF BREATH 04/01/19   Etta Grandchild, MD    Allergies    Penicillins  Review of Systems   Review of Systems  Respiratory: Positive for shortness of breath.   Cardiovascular: Positive for chest pain.  Gastrointestinal: Positive for vomiting.  All other systems reviewed and are negative.   Physical Exam Updated Vital Signs BP 133/88   Pulse 88   Temp 98.6 F (37 C)   Resp 16   SpO2 98%   Physical Exam Vitals and nursing note reviewed.  Constitutional:      General: He is not in acute distress.    Appearance: He is well-developed.     Comments: Elderly male who appears nontoxic  HENT:     Head: Normocephalic and atraumatic.  Eyes:     Extraocular Movements: Extraocular movements intact.     Conjunctiva/sclera: Conjunctivae normal.     Pupils: Pupils are equal, round, and reactive to light.  Cardiovascular:     Rate and Rhythm: Normal rate and regular rhythm.     Pulses: Normal pulses.  Pulmonary:     Effort: Pulmonary effort is normal. No respiratory distress.     Breath sounds: Wheezing present.     Comments: Wheezing in all fields.  Abdominal:     General: There is no  distension.     Palpations: Abdomen is soft. There is no mass.     Tenderness: There is no abdominal tenderness. There is no guarding or rebound.     Comments: No ttp  Musculoskeletal:        General: Normal range of motion.     Cervical back: Normal range of motion and neck supple.     Right lower leg: No edema.     Left lower leg: No edema.  Skin:    General: Skin is warm and dry.     Capillary  Refill: Capillary refill takes less than 2 seconds.  Neurological:     Mental Status: He is alert and oriented to person, place, and time.     ED Results / Procedures / Treatments   Labs (all labs ordered are listed, but only abnormal results are displayed) Labs Reviewed  COMPREHENSIVE METABOLIC PANEL - Abnormal; Notable for the following components:      Result Value   Chloride 92 (*)    Glucose, Bld 167 (*)    Creatinine, Ser 1.35 (*)    AST 73 (*)    ALT 78 (*)    GFR calc non Af Amer 49 (*)    GFR calc Af Amer 57 (*)    All other components within normal limits  D-DIMER, QUANTITATIVE (NOT AT [facility]) - Abnormal; Notable for the following components:   D-Dimer, Quant 2.52 (*)    All other components within normal limits  CBC WITH DIFFERENTIAL/PLATELET - Abnormal; Notable for the following components:   WBC 17.6 (*)    Hemoglobin 17.7 (*)    HCT 52.2 (*)    Neutro Abs 15.2 (*)    Monocytes Absolute 1.4 (*)    Abs Immature Granulocytes 0.13 (*)    All other components within normal limits  SARS CORONAVIRUS 2 BY RT PCR (HOSPITAL ORDER, PERFORMED IN [facility])  LIPASE, BLOOD  CBC WITH DIFFERENTIAL/PLATELET  TROPONIN I (HIGH SENSITIVITY)  TROPONIN I (HIGH SENSITIVITY)    EKG EKG Interpretation  Date/Time:  Sunday June 04 2020 15:43:28 EDT Ventricular Rate:  119 PR Interval:    QRS Duration: 102 QT Interval:  312 QTC Calculation: 439 R Axis:   -85 Text Interpretation: Sinus tachycardia Left anterior fascicular block Anterior infarct, old ST elevation,  consider inferior injury Confirmed by Kennis Carina [phone]) on 06/04/2020 5:23:08 PM   Radiology CT Angio Chest PE W and/or Wo Contrast  Result Date: 06/04/2020 CLINICAL DATA:  Shortness of breath chest pain EXAM: CT ANGIOGRAPHY CHEST WITH CONTRAST TECHNIQUE: Multidetector CT imaging of the chest was performed using the standard protocol during bolus administration of intravenous contrast. Multiplanar CT image reconstructions and MIPs were obtained to evaluate the vascular anatomy. CONTRAST:  63mL OMNIPAQUE IOHEXOL 350 MG/ML SOLN COMPARISON:  None. FINDINGS: Cardiovascular: There is a optimal opacification of the pulmonary arteries. There is no central,segmental, or subsegmental filling defects within the pulmonary arteries. The heart is normal in size. No pericardial effusion or thickening. No evidence right heart strain. There is normal three-vessel brachiocephalic anatomy without proximal stenosis. Scattered aortic atherosclerosis is noted. Coronary artery and aortic valve calcifications are noted. Mediastinum/Nodes: No hilar, mediastinal, or axillary adenopathy. Thyroid gland, trachea, and esophagus demonstrate no significant findings. Lungs/Pleura: Extensive centrilobular emphysematous changes seen at both lung apices. Bilateral biapical scarring and subpleural bleb formation is seen. There appears to be small amount of tree-in-bud opacity seen at the posterior bilateral upper lobe and posterior right lung base. A small 6 mm pulmonary nodule seen in the posterior right lung base best seen on series 6, image 73. No pleural effusion is seen. Upper Abdomen: No acute abnormalities present in the visualized portions of the upper abdomen. Musculoskeletal: No chest wall abnormality. No acute or significant osseous findings. Review of the MIP images confirms the above findings. IMPRESSION: 1. No central, segmental, or subsegmental pulmonary embolism. 2. Minimal tree-in-bud opacities seen at the periphery of the  bilateral upper lobes and posterior right lung base which could be due to infectious or inflammatory process. 3. 6  mm pulmonary nodule in the posterior right lung base. Non-contrast chest CT at 6-12 months is recommended. If the nodule is stable at time of repeat CT, then future CT at 18-24 months (from today's scan) is considered optional for low-risk patients, but is recommended for high-risk patients. This recommendation follows the consensus statement: Guidelines for Management of Incidental Pulmonary Nodules Detected on CT Images: From the Fleischner Society 2017; Radiology 2017; 284:228-243. 4.  Emphysema (ICD10-J43.9). 5.  Aortic Atherosclerosis (ICD10-I70.0). Electronically Signed   By: Jonna Clark M.D.   On: 06/04/2020 19:46   DG Chest Portable 1 View  Result Date: 06/04/2020 CLINICAL DATA:  Shortness of breath EXAM: PORTABLE CHEST 1 VIEW COMPARISON:  02/17/2017 FINDINGS: Biapical scarring. Heart is normal size. No acute confluent opacities or effusions. No acute bony abnormality. IMPRESSION: Biapical scarring.  No active disease. Electronically Signed   By: Charlett Nose M.D.   On: 06/04/2020 15:28    Procedures Procedures (including critical care time)  Medications Ordered in ED Medications  methylPREDNISolone sodium succinate (SOLU-MEDROL) 125 mg/2 mL injection 125 mg (125 mg Intravenous Given 06/04/20 1608)  magnesium sulfate IVPB 2 g 50 mL (0 g Intravenous Stopped 06/04/20 1847)  albuterol (VENTOLIN HFA) 108 (90 Base) MCG/ACT inhaler 8 puff (8 puffs Inhalation Given 06/04/20 1604)  iohexol (OMNIPAQUE) 350 MG/ML injection 75 mL (41 mLs Intravenous Contrast Given 06/04/20 1926)    ED Course  I have reviewed the triage vital signs and the nursing notes.  Pertinent labs & imaging results that were available during my care of the patient were reviewed by me and considered in my medical decision making (see chart for details).    MDM Rules/Calculators/A&P                          Patient  presented for evaluation of worsening shortness of breath.  Hypoxic on room air with EMS, although this improved after breathing treatment.  However he continues to be in the upper 80s on room air on my evaluation, this improved with 2 L via nasal cannula.  He has significant wheezing in all fields, will give further albuterol, Solu-Medrol, and mag.  Due to hypoxia and tachycardia, also consider PE.  Consider Covid or other viral illness.  Consider pneumonia. Case discussed with attending, Dr. Pilar Plate evaluated the pt.   Labs show mild leukocytosis of 17.  Dimer elevated at 2.5, he will need a CTA to rule out PE.  Labs otherwise reassuring.  Troponin negative x2.  Abdominal labs are reassuring.  Covid test is negative.  On reassessment after albuterol medication, patient reports he is feeling well.  He continues to have wheezing, though son states this is baseline.  CTA negative for PE.  Does show signs of inflammation versus infection.  Will treat for COPD exacerbation with steroids and antibiotics.  Patient ambulated, and sats dropped to 87% with ambulation.  I discussed recommendation for admission to the hospital, patient declined.  With patient son present, discussed risks of leaving AMA, up to and including death.  Patient states he understands, but still like to leave.  Patient appears able to make his own medical decisions at this time, and is of sound mind.  As such, patient to leave AMA.  Final Clinical Impression(s) / ED Diagnoses Final diagnoses:  COPD exacerbation (HCC)    Rx / DC Orders ED Discharge Orders         Ordered    predniSONE (DELTASONE) 20  MG tablet  Daily     Discontinue  Reprint     06/04/20 2037    azithromycin (ZITHROMAX) 250 MG tablet  Daily     Discontinue  Reprint     06/04/20 2037           Alveria Apley, PA-C 06/04/20 2310    Sabas Sous, MD 06/07/20 [phone]

## 2020-06-04 NOTE — ED Notes (Addendum)
Room air oxygen saturations are between 88-93%.  Ambulated pt in hallway oxygen saturation remained 87-88%.  PA made aware.

## 2020-06-04 NOTE — ED Notes (Signed)
No chest pain  The pts sats will not stay up without 02     He stopped smoking 8 months ago

## 2020-06-04 NOTE — ED Triage Notes (Signed)
Patient arrived by [facility] from home following episode of SOB. Patient also had RLQ abd. Pain and loose stools yesterday that he reports has resolved. Patient with initial sats in 80s and 98% following neb. Patient alert and denies pain, states that he feels fine. NAD

## 2020-07-20 DIAGNOSIS — E7800 Pure hypercholesterolemia, unspecified: Secondary | ICD-10-CM | POA: Diagnosis not present

## 2020-07-21 ENCOUNTER — Encounter: Payer: Self-pay | Admitting: *Deleted

## 2020-07-21 LAB — HEPATIC FUNCTION PANEL
ALT: 31 IU/L (ref 0–44)
AST: 35 IU/L (ref 0–40)
Albumin: 4.9 g/dL — ABNORMAL HIGH (ref 3.7–4.7)
Alkaline Phosphatase: 71 IU/L (ref 44–121)
Bilirubin Total: 0.5 mg/dL (ref 0.0–1.2)
Bilirubin, Direct: 0.17 mg/dL (ref 0.00–0.40)
Total Protein: 7.4 g/dL (ref 6.0–8.5)

## 2020-07-21 LAB — LIPID PANEL
Chol/HDL Ratio: 3.1 ratio (ref 0.0–5.0)
Cholesterol, Total: 134 mg/dL (ref 100–199)
HDL: 43 mg/dL (ref >39)
LDL Chol Calc (NIH): 58 mg/dL (ref 0–99)
Triglycerides: 205 mg/dL — ABNORMAL HIGH (ref 0–149)
VLDL Cholesterol Cal: 33 mg/dL (ref 5–40)

## 2020-08-20 ENCOUNTER — Other Ambulatory Visit: Payer: Self-pay | Admitting: Internal Medicine

## 2020-08-20 DIAGNOSIS — J449 Chronic obstructive pulmonary disease, unspecified: Secondary | ICD-10-CM

## 2020-11-29 ENCOUNTER — Other Ambulatory Visit: Payer: Self-pay | Admitting: Internal Medicine

## 2020-11-29 DIAGNOSIS — J449 Chronic obstructive pulmonary disease, unspecified: Secondary | ICD-10-CM

## 2020-12-07 ENCOUNTER — Other Ambulatory Visit (HOSPITAL_COMMUNITY): Payer: Self-pay | Admitting: Cardiology

## 2020-12-07 ENCOUNTER — Ambulatory Visit (HOSPITAL_COMMUNITY)
Admission: RE | Admit: 2020-12-07 | Discharge: 2020-12-07 | Disposition: A | Discharge status: Home / Self Care | Payer: Medicare Other | Source: Ambulatory Visit | Attending: Cardiovascular Disease | Admitting: Cardiovascular Disease

## 2020-12-07 ENCOUNTER — Other Ambulatory Visit: Payer: Self-pay

## 2020-12-07 DIAGNOSIS — I679 Cerebrovascular disease, unspecified: Secondary | ICD-10-CM

## 2020-12-07 DIAGNOSIS — I6523 Occlusion and stenosis of bilateral carotid arteries: Secondary | ICD-10-CM

## 2020-12-08 ENCOUNTER — Encounter: Payer: Self-pay | Admitting: *Deleted

## 2021-03-05 ENCOUNTER — Other Ambulatory Visit: Payer: Self-pay | Admitting: Internal Medicine

## 2021-03-05 DIAGNOSIS — J449 Chronic obstructive pulmonary disease, unspecified: Secondary | ICD-10-CM

## 2021-03-07 ENCOUNTER — Other Ambulatory Visit: Payer: Self-pay | Admitting: Internal Medicine

## 2021-03-07 DIAGNOSIS — J449 Chronic obstructive pulmonary disease, unspecified: Secondary | ICD-10-CM

## 2021-04-08 ENCOUNTER — Other Ambulatory Visit: Payer: Self-pay | Admitting: Cardiology

## 2021-04-08 DIAGNOSIS — E7800 Pure hypercholesterolemia, unspecified: Secondary | ICD-10-CM

## 2021-04-12 ENCOUNTER — Telehealth: Payer: Self-pay

## 2021-04-12 ENCOUNTER — Other Ambulatory Visit: Payer: Self-pay | Admitting: Cardiology

## 2021-04-12 DIAGNOSIS — E876 Hypokalemia: Secondary | ICD-10-CM

## 2021-04-12 DIAGNOSIS — E7800 Pure hypercholesterolemia, unspecified: Secondary | ICD-10-CM

## 2021-04-12 MED ORDER — POTASSIUM CHLORIDE CRYS ER 20 MEQ PO TBCR: 20.0000 meq | EXTENDED_RELEASE_TABLET | Freq: Every day | ORAL | 0 refills | Status: DC

## 2021-04-12 NOTE — Telephone Encounter (Signed)
Refill sent to the pharmacy electronically.  

## 2021-04-17 ENCOUNTER — Other Ambulatory Visit: Payer: Self-pay | Admitting: Cardiology

## 2021-04-17 DIAGNOSIS — E7800 Pure hypercholesterolemia, unspecified: Secondary | ICD-10-CM

## 2021-05-03 ENCOUNTER — Other Ambulatory Visit: Payer: Self-pay

## 2021-05-14 ENCOUNTER — Other Ambulatory Visit: Payer: Self-pay | Admitting: Cardiology

## 2021-05-16 ENCOUNTER — Other Ambulatory Visit: Payer: Self-pay | Admitting: Cardiology

## 2021-06-16 ENCOUNTER — Other Ambulatory Visit: Payer: Self-pay | Admitting: Internal Medicine

## 2021-06-16 DIAGNOSIS — J449 Chronic obstructive pulmonary disease, unspecified: Secondary | ICD-10-CM

## 2021-06-19 ENCOUNTER — Other Ambulatory Visit: Payer: Self-pay | Admitting: Internal Medicine

## 2021-06-19 DIAGNOSIS — J449 Chronic obstructive pulmonary disease, unspecified: Secondary | ICD-10-CM

## 2021-08-11 ENCOUNTER — Other Ambulatory Visit: Payer: Self-pay | Admitting: Cardiology

## 2021-08-13 ENCOUNTER — Other Ambulatory Visit: Payer: Self-pay | Admitting: Cardiology

## 2021-08-15 ENCOUNTER — Other Ambulatory Visit: Payer: Self-pay

## 2021-08-15 MED ORDER — ROSUVASTATIN CALCIUM 40 MG PO TABS: ORAL_TABLET | ORAL | 0 refills | Status: DC

## 2021-08-16 ENCOUNTER — Ambulatory Visit (INDEPENDENT_AMBULATORY_CARE_PROVIDER_SITE_OTHER): Payer: Medicare Other | Admitting: Internal Medicine

## 2021-08-16 ENCOUNTER — Encounter: Payer: Self-pay | Admitting: Internal Medicine

## 2021-08-16 ENCOUNTER — Ambulatory Visit (INDEPENDENT_AMBULATORY_CARE_PROVIDER_SITE_OTHER): Payer: Medicare Other

## 2021-08-16 ENCOUNTER — Other Ambulatory Visit: Payer: Self-pay

## 2021-08-16 VITALS — BP 116/80 | HR 93 | Ht 69.0 in | Wt 144.0 lb

## 2021-08-16 DIAGNOSIS — J449 Chronic obstructive pulmonary disease, unspecified: Secondary | ICD-10-CM

## 2021-08-16 DIAGNOSIS — R5383 Other fatigue: Secondary | ICD-10-CM | POA: Diagnosis not present

## 2021-08-16 DIAGNOSIS — I6523 Occlusion and stenosis of bilateral carotid arteries: Secondary | ICD-10-CM

## 2021-08-16 DIAGNOSIS — E559 Vitamin D deficiency, unspecified: Secondary | ICD-10-CM

## 2021-08-16 DIAGNOSIS — I10 Essential (primary) hypertension: Secondary | ICD-10-CM

## 2021-08-16 DIAGNOSIS — J439 Emphysema, unspecified: Secondary | ICD-10-CM | POA: Diagnosis not present

## 2021-08-16 DIAGNOSIS — J9611 Chronic respiratory failure with hypoxia: Secondary | ICD-10-CM

## 2021-08-16 DIAGNOSIS — E538 Deficiency of other specified B group vitamins: Secondary | ICD-10-CM | POA: Diagnosis not present

## 2021-08-16 DIAGNOSIS — R911 Solitary pulmonary nodule: Secondary | ICD-10-CM | POA: Diagnosis not present

## 2021-08-16 DIAGNOSIS — R739 Hyperglycemia, unspecified: Secondary | ICD-10-CM | POA: Diagnosis not present

## 2021-08-16 DIAGNOSIS — E785 Hyperlipidemia, unspecified: Secondary | ICD-10-CM | POA: Diagnosis not present

## 2021-08-16 LAB — URINALYSIS, ROUTINE W REFLEX MICROSCOPIC
Bilirubin Urine: NEGATIVE
Ketones, ur: NEGATIVE
Leukocytes,Ua: NEGATIVE
Nitrite: NEGATIVE
Specific Gravity, Urine: 1.02 (ref 1.000–1.030)
Urine Glucose: NEGATIVE
Urobilinogen, UA: 1 (ref 0.0–1.0)
WBC, UA: NONE SEEN (ref 0–?)
pH: 6 (ref 5.0–8.0)

## 2021-08-16 LAB — CBC WITH DIFFERENTIAL/PLATELET
Basophils Absolute: 0.1 10*3/uL (ref 0.0–0.1)
Basophils Relative: 0.5 % (ref 0.0–3.0)
Eosinophils Absolute: 0.2 10*3/uL (ref 0.0–0.7)
Eosinophils Relative: 1.1 % (ref 0.0–5.0)
HCT: 44.7 % (ref 39.0–52.0)
Hemoglobin: 14.5 g/dL (ref 13.0–17.0)
Lymphocytes Relative: 8.8 % — ABNORMAL LOW (ref 12.0–46.0)
Lymphs Abs: 1.3 10*3/uL (ref 0.7–4.0)
MCHC: 32.3 g/dL (ref 30.0–36.0)
MCV: 99.8 fl (ref 78.0–100.0)
Monocytes Absolute: 1.6 10*3/uL — ABNORMAL HIGH (ref 0.1–1.0)
Monocytes Relative: 11.4 % (ref 3.0–12.0)
Neutro Abs: 11.1 10*3/uL — ABNORMAL HIGH (ref 1.4–7.7)
Neutrophils Relative %: 78.2 % — ABNORMAL HIGH (ref 43.0–77.0)
Platelets: 256 10*3/uL (ref 150.0–400.0)
RBC: 4.48 Mil/uL (ref 4.22–5.81)
RDW: 14 % (ref 11.5–15.5)
WBC: 14.2 10*3/uL — ABNORMAL HIGH (ref 4.0–10.5)

## 2021-08-16 LAB — VITAMIN B12: Vitamin B-12: 449 pg/mL (ref 211–911)

## 2021-08-16 LAB — HEPATIC FUNCTION PANEL
ALT: 16 U/L (ref 0–53)
AST: 24 U/L (ref 0–37)
Albumin: 3.7 g/dL (ref 3.5–5.2)
Alkaline Phosphatase: 57 U/L (ref 39–117)
Bilirubin, Direct: 0.1 mg/dL (ref 0.0–0.3)
Total Bilirubin: 0.4 mg/dL (ref 0.2–1.2)
Total Protein: 6.7 g/dL (ref 6.0–8.3)

## 2021-08-16 LAB — TSH: TSH: 0.79 u[IU]/mL (ref 0.35–5.50)

## 2021-08-16 LAB — BASIC METABOLIC PANEL
BUN: 22 mg/dL (ref 6–23)
CO2: 39 mEq/L — ABNORMAL HIGH (ref 19–32)
Calcium: 9.9 mg/dL (ref 8.4–10.5)
Chloride: 93 mEq/L — ABNORMAL LOW (ref 96–112)
Creatinine, Ser: 1.35 mg/dL (ref 0.40–1.50)
GFR: 49.09 mL/min — ABNORMAL LOW (ref >60.00)
Glucose, Bld: 121 mg/dL — ABNORMAL HIGH (ref 70–99)
Potassium: 3.9 mEq/L (ref 3.5–5.1)
Sodium: 138 mEq/L (ref 135–145)

## 2021-08-16 LAB — LIPID PANEL
Cholesterol: 88 mg/dL (ref 0–200)
HDL: 36.5 mg/dL — ABNORMAL LOW (ref >39.00)
LDL Cholesterol: 36 mg/dL (ref 0–99)
NonHDL: 51.07
Total CHOL/HDL Ratio: 2
Triglycerides: 73 mg/dL (ref 0.0–149.0)
VLDL: 14.6 mg/dL (ref 0.0–40.0)

## 2021-08-16 LAB — VITAMIN D 25 HYDROXY (VIT D DEFICIENCY, FRACTURES): VITD: 68.79 ng/mL (ref 30.00–100.00)

## 2021-08-16 LAB — HEMOGLOBIN A1C: Hgb A1c MFr Bld: 7.6 % — ABNORMAL HIGH (ref 4.6–6.5)

## 2021-08-16 MED ORDER — ALBUTEROL SULFATE HFA 108 (90 BASE) MCG/ACT IN AERS: 2.0000 | INHALATION_SPRAY | Freq: Four times a day (QID) | RESPIRATORY_TRACT | 11 refills | Status: DC | PRN

## 2021-08-16 NOTE — Patient Instructions (Signed)
You appear to have low oxygen today  We will order the Home Oxygen 2L continuous per [facility]  Please take all new medication as prescribed - the albuterol hfa inhaler as needed  Please continue all other medications as before, and refills have been done if requested.  Please have the pharmacy call with any other refills you may need.  Please continue your efforts at being more active, low cholesterol diet, and weight control.  Please keep your appointments with your specialists as you may have planned  You will be contacted regarding the referral for: CT scan of chest for the very small lung nodule last year  Please go to the LAB at the blood drawing area for the tests to be done  You will be contacted by phone if any changes need to be made immediately.  Otherwise, you will receive a letter about your results with an explanation, but please check with MyChart first.  Please remember to sign up for MyChart if you have not done so, as this will be important to you in the future with finding out test results, communicating by private email, and scheduling acute appointments online when needed.  Please make an Appointment to return in 3 months, or sooner if needed, to Dr Yetta Barre

## 2021-08-16 NOTE — Progress Notes (Signed)
Patient ID: MAREO PORTILLA, male   DOB: 1941-05-12, 80 y.o.   MRN: [id number]        Chief Complaint: follow up low oxygen at home, copd, lung nodule       HPI:  MIKIAH DEMOND is a 80 y.o. male here with son who mentions low o2 sats in the past 3 wks they have been checking by oximetry, often in low 80's.  Pt very stoic, denies all symptoms except mild sob/doe at 100 ft, such as walking in here from the parking lot.  Pt denies chest pain, wheezing, orthopnea, PND, increased LE swelling, palpitations, dizziness or syncope. Denies fever, ST, cough.   Pt denies wt loss, night sweats, loss of appetite, or other constitutional symptoms, though has lost significant wt recently for unclear reason, except food just doesn't taste good.  Did have CTA chest aug 2021 with 6 mm nodule and recommended for f/u.  Pt has not smoked for > 20 yrs.         Wt Readings from Last 3 Encounters:  08/16/21 144 lb (65.3 kg)  04/18/20 160 lb 12.8 oz (72.9 kg)  04/01/19 166 lb (75.3 kg)   BP Readings from Last 3 Encounters:  08/16/21 116/80  06/04/20 133/88  04/18/20 130/80         Past Medical History:  Diagnosis Date   Asthma    as a child   Carotid arterial disease (HCC)    a. 11/2015 Carotid U/S: LICA 40-59%, RICA 100 CTO, nl subclavian arteries--f/u 1 yr.   Coronary atherosclerosis of native coronary artery    a. 09/2001 inflat MI/PCI: RCA 82m (3.0x18 AVE S7 BMS); b. 07/2006 Cath: LM nl, LAD 50p, LCX min irregs, OM1 large, min irregs, OM2 min irregs, RCA 20 ISR;  c. 10/2012 MV: EF 74%, no ischemia.   Essential hypertension    Heart murmur    since a child   Hyperlipidemia    Myocardial infarction [facility]) 2002   Pneumonia    age 62   Right inguinal hernia    Past Surgical History:  Procedure Laterality Date   CARDIAC CATHETERIZATION     Carotid stents     COLONOSCOPY     FRACTURE SURGERY     HERNIA REPAIR     ventral   INGUINAL HERNIA REPAIR Right 11/01/2016   Procedure: RIGHT INGUINAL HERNIA REPAIR  WITH MESH;  Surgeon: Avel Peace, MD;  Location: [facility] OR;  Service: General;  Laterality: Right;   INSERTION OF MESH Right 11/01/2016   Procedure: INSERTION OF MESH;  Surgeon: Avel Peace, MD;  Location: [facility] OR;  Service: General;  Laterality: Right;   ORIF SHOULDER FRACTURE Right     reports that he quit smoking about 22 years ago. His smoking use included cigarettes. He has a 44.00 pack-year smoking history. He has quit using smokeless tobacco.  His smokeless tobacco use included chew. He reports that he does not drink alcohol and does not use drugs. family history includes Hypertension in an other family member. Allergies  Allergen Reactions   Penicillins Hives and Swelling    Has patient had a PCN reaction causing immediate rash, facial/tongue/throat swelling, SOB or lightheadedness with hypotension: Yes Has patient had a PCN reaction causing severe rash involving mucus membranes or skin necrosis: Yes Has patient had a PCN reaction that required hospitalization No Has patient had a PCN reaction occurring within the last 10 years: No If all of the above answers are "NO", then may proceed  with Cephalosporin use.    Current Outpatient Medications on File Prior to Visit  Medication Sig Dispense Refill   albuterol (PROVENTIL) (2.5 MG/3ML) 0.083% nebulizer solution USE 1 VIAL VIA NEBULIZER EVERY 6 HOURS AS NEEDED FOR WHEEZING OR SHORTNESS OF BREATH 75 mL 3   aspirin EC 81 MG EC tablet Take 1 tablet (81 mg total) by mouth daily. 30 tablet 0   ezetimibe (ZETIA) 10 MG tablet TAKE 1 TABLET(10 MG) BY MOUTH DAILY 90 tablet 0   hydrochlorothiazide (MICROZIDE) 12.5 MG capsule TAKE 1 CAPSULE BY MOUTH DAILY 90 capsule 0   potassium chloride SA (KLOR-CON) 20 MEQ tablet Take 1 tablet (20 mEq total) by mouth daily. 90 tablet 0   rosuvastatin (CRESTOR) 40 MG tablet TAKE 1 TABLET(40 MG) BY MOUTH DAILY 90 tablet 0   Tiotropium Bromide-Olodaterol (STIOLTO RESPIMAT) 2.5-2.5 MCG/ACT AERS Inhale 2 puffs into  the lungs daily. 3 Inhaler 1   azithromycin (ZITHROMAX) 250 MG tablet Take 1 tablet (250 mg total) by mouth daily. Take first 2 tablets together, then 1 every day until finished. (Patient not taking: Reported on 08/16/2021) 6 tablet 0   No current facility-administered medications on file prior to visit.        ROS:  All others reviewed and negative.  Objective        PE:  BP 116/80   Pulse 93   Ht 5\' 9"  (1.753 m)   Wt 144 lb (65.3 kg)   SpO2 (!) 82%   BMI 21.27 kg/m                 Constitutional: Pt appears in NAD, non toxic               HENT: Head: NCAT.                Right Ear: External ear normal.                 Left Ear: External ear normal.                Eyes: . Pupils are equal, round, and reactive to light. Conjunctivae and EOM are normal               Nose: without d/c or deformity               Neck: Neck supple. Gross normal ROM               Cardiovascular: Normal rate and regular rhythm.                 Pulmonary/Chest: Effort normal and breath sounds without rales or wheezing.                Abd:  Soft, NT, ND, + BS, no organomegaly               Neurological: Pt is alert. At baseline orientation, motor grossly intact               Skin: Skin is warm. No rashes, no other new lesions, LE edema - none               Psychiatric: Pt behavior is normal without agitation   Micro: none  Cardiac tracings I have personally interpreted today:  none  Pertinent Radiological findings (summarize): none   Lab Results  Component Value Date   WBC 14.2 (H) 08/16/2021   HGB 14.5 08/16/2021   HCT 44.7 08/16/2021   PLT  256.0 08/16/2021   GLUCOSE 121 (H) 08/16/2021   CHOL 88 08/16/2021   TRIG 73.0 08/16/2021   HDL 36.50 (L) 08/16/2021   LDLDIRECT 77.7 04/12/2008   LDLCALC 36 08/16/2021   ALT 16 08/16/2021   AST 24 08/16/2021   NA 138 08/16/2021   K 3.9 08/16/2021   CL 93 (L) 08/16/2021   CREATININE 1.35 08/16/2021   BUN 22 08/16/2021   CO2 39 (H) 08/16/2021   TSH  0.79 08/16/2021   HGBA1C 7.6 (H) 08/16/2021   Assessment/Plan:  PAWAN KNECHTEL is a 80 y.o. White or Caucasian [1] male with  has a past medical history of Asthma, Carotid arterial disease (HCC), Coronary atherosclerosis of native coronary artery, Essential hypertension, Heart murmur, Hyperlipidemia, Myocardial infarction (HCC) (2002), Pneumonia, and Right inguinal hernia.  COPD III/ hypercarbic Mild symptomatic, for cxr for albuterol hfa prn,  to f/u any worsening symptoms or concerns  Chronic hypoxemic respiratory failure (HCC) Ok for home o2 2L continuous - to start with order today to [facility]  Fatigue Etiology unclear, Exam otherwise benign, to check labs as documented, follow with expectant management  Pulmonary nodule 6 mm - ? Clinical significance, but for f/u CT chest given recent wt loss  Essential hypertension, benign BP Readings from Last 3 Encounters:  08/16/21 116/80  06/04/20 133/88  04/18/20 130/80   Stable, pt to continue medical treatment  - hct but consider d/c for persistent  Low normal BP given recent wt loss   Hyperlipidemia LDL goal <70 Lab Results  Component Value Date   LDLCALC 36 08/16/2021   Stable, pt to continue current zetia  Followup: Return in about 3 months (around 11/16/2021).  Oliver Barre, MD 08/19/2021 1:23 PM [facility]

## 2021-08-17 ENCOUNTER — Other Ambulatory Visit: Payer: Self-pay | Admitting: Internal Medicine

## 2021-08-17 MED ORDER — LEVOFLOXACIN 500 MG PO TABS: 500.0000 mg | ORAL_TABLET | Freq: Every day | ORAL | 0 refills | Status: AC

## 2021-08-17 NOTE — Telephone Encounter (Signed)
Tried calling both numbers listed in patient's chart and unable to reach patient or patient's family

## 2021-08-17 NOTE — Telephone Encounter (Signed)
Ok to contact pt or son  Cxr is c/w possible several small areas of infection  I sent Levaquin antibiotic to: [id number] - [place], [place] - [address]

## 2021-08-19 ENCOUNTER — Encounter: Payer: Self-pay | Admitting: Internal Medicine

## 2021-08-19 NOTE — Assessment & Plan Note (Addendum)
Mild symptomatic, for cxr for albuterol hfa prn,  to f/u any worsening symptoms or concerns

## 2021-08-19 NOTE — Assessment & Plan Note (Signed)
Lab Results  Component Value Date   LDLCALC 36 08/16/2021   Stable, pt to continue current zetia

## 2021-08-19 NOTE — Assessment & Plan Note (Signed)
Ok for home o2 2L continuous - to start with order today to [facility]

## 2021-08-19 NOTE — Assessment & Plan Note (Signed)
Etiology unclear, Exam otherwise benign, to check labs as documented, follow with expectant management  

## 2021-08-19 NOTE — Assessment & Plan Note (Signed)
BP Readings from Last 3 Encounters:  08/16/21 116/80  06/04/20 133/88  04/18/20 130/80   Stable, pt to continue medical treatment  - hct but consider d/c for persistent  Low normal BP given recent wt loss

## 2021-08-19 NOTE — Assessment & Plan Note (Signed)
6 mm - ? Clinical significance, but for f/u CT chest given recent wt loss

## 2021-08-23 NOTE — Progress Notes (Deleted)
HPI: FU coronary artery disease. His last cardiac catheterization on August 04, 2006, showed an ejection fraction of 65%. The distal LAD had a 50% stenosis at the origin of the diagonal. The right coronary artery stent was patent. There is a 20% in-stent restenosis. Nuclear study in January of 2014 showed an ejection fraction of 74% and no ischemia. Note an abdominal ultrasound in May 2005 showed no aneurysm. Last echocardiogram March 2018 showed normal LV function and grade 2 diastolic dysfunction.  CTA August 2021 showed 6 mm pulmonary nodule and follow-up recommended in 6 to 12 months.  Carotid Dopplers February 2022 showed 40-59% left and totally occluded right.  Follow-up study recommended 1 year.  Since I last saw him,   Current Outpatient Medications  Medication Sig Dispense Refill   albuterol (PROVENTIL) (2.5 MG/3ML) 0.083% nebulizer solution USE 1 VIAL VIA NEBULIZER EVERY 6 HOURS AS NEEDED FOR WHEEZING OR SHORTNESS OF BREATH 75 mL 3   albuterol (VENTOLIN HFA) 108 (90 Base) MCG/ACT inhaler Inhale 2 puffs into the lungs every 6 (six) hours as needed for wheezing or shortness of breath. 8 g 11   aspirin EC 81 MG EC tablet Take 1 tablet (81 mg total) by mouth daily. 30 tablet 0   azithromycin (ZITHROMAX) 250 MG tablet Take 1 tablet (250 mg total) by mouth daily. Take first 2 tablets together, then 1 every day until finished. (Patient not taking: Reported on 08/16/2021) 6 tablet 0   ezetimibe (ZETIA) 10 MG tablet TAKE 1 TABLET(10 MG) BY MOUTH DAILY 90 tablet 0   hydrochlorothiazide (MICROZIDE) 12.5 MG capsule TAKE 1 CAPSULE BY MOUTH DAILY 90 capsule 0   levofloxacin (LEVAQUIN) 500 MG tablet Take 1 tablet (500 mg total) by mouth daily for 10 days. 10 tablet 0   potassium chloride SA (KLOR-CON) 20 MEQ tablet Take 1 tablet (20 mEq total) by mouth daily. 90 tablet 0   rosuvastatin (CRESTOR) 40 MG tablet TAKE 1 TABLET(40 MG) BY MOUTH DAILY 90 tablet 0   Tiotropium Bromide-Olodaterol (STIOLTO  RESPIMAT) 2.5-2.5 MCG/ACT AERS Inhale 2 puffs into the lungs daily. 3 Inhaler 1   No current facility-administered medications for this visit.     Past Medical History:  Diagnosis Date   Asthma    as a child   Carotid arterial disease (HCC)    a. 11/2015 Carotid U/S: LICA 40-59%, RICA 100 CTO, nl subclavian arteries--f/u 1 yr.   Coronary atherosclerosis of native coronary artery    a. 09/2001 inflat MI/PCI: RCA 49m (3.0x18 AVE S7 BMS); b. 07/2006 Cath: LM nl, LAD 50p, LCX min irregs, OM1 large, min irregs, OM2 min irregs, RCA 20 ISR;  c. 10/2012 MV: EF 74%, no ischemia.   Essential hypertension    Heart murmur    since a child   Hyperlipidemia    Myocardial infarction [facility]) 2002   Pneumonia    age 55   Right inguinal hernia     Past Surgical History:  Procedure Laterality Date   CARDIAC CATHETERIZATION     Carotid stents     COLONOSCOPY     FRACTURE SURGERY     HERNIA REPAIR     ventral   INGUINAL HERNIA REPAIR Right 11/01/2016   Procedure: RIGHT INGUINAL HERNIA REPAIR WITH MESH;  Surgeon: Avel Peace, MD;  Location: [facility] OR;  Service: General;  Laterality: Right;   INSERTION OF MESH Right 11/01/2016   Procedure: INSERTION OF MESH;  Surgeon: Avel Peace, MD;  Location: [facility] OR;  Service: General;  Laterality: Right;   ORIF SHOULDER FRACTURE Right     Social History   Socioeconomic History   Marital status: Married    Spouse name: Not on file   Number of children: Not on file   Years of education: Not on file   Highest education level: Not on file  Occupational History    Employer: RETIRED  Tobacco Use   Smoking status: Former    Packs/day: 1.00    Years: 44.00    Pack years: 44.00    Types: Cigarettes    Quit date: 10/28/1998    Years since quitting: 22.8   Smokeless tobacco: Former    Types: Chew   Tobacco comments:    quit chewing tobacco 2001 ish  Substance and Sexual Activity   Alcohol use: No    Alcohol/week: 0.0 standard drinks   Drug use: No    Sexual activity: Not on file  Other Topics Concern   Not on file  Social History Narrative   Lives in [place] with his wife.   Social Determinants of Health   Financial Resource Strain: Not on file  Food Insecurity: Not on file  Transportation Needs: Not on file  Physical Activity: Not on file  Stress: Not on file  Social Connections: Not on file  Intimate Partner Violence: Not on file    Family History  Problem Relation Age of Onset   Hypertension Other     ROS: no fevers or chills, productive cough, hemoptysis, dysphasia, odynophagia, melena, hematochezia, dysuria, hematuria, rash, seizure activity, orthopnea, PND, pedal edema, claudication. Remaining systems are negative.  Physical Exam: Well-developed well-nourished in no acute distress.  Skin is warm and dry.  HEENT is normal.  Neck is supple.  Chest is clear to auscultation with normal expansion.  Cardiovascular exam is regular rate and rhythm.  Abdominal exam nontender or distended. No masses palpated. Extremities show no edema. neuro grossly intact  ECG- personally reviewed  A/P  1 coronary artery disease-patient denies chest pain.  Plan to continue aspirin and statin.  2 hypertension-patient's blood pressure is controlled.  Continue present medical regimen.  3 hyperlipidemia-continue statin.  4 carotid artery disease-patient will need follow-up carotid Dopplers February 2023.  5 history of lung nodule-  Olga Millers, MD

## 2021-08-28 ENCOUNTER — Ambulatory Visit: Payer: Medicare Other | Admitting: Cardiology

## 2021-09-11 ENCOUNTER — Ambulatory Visit
Admission: RE | Admit: 2021-09-11 | Discharge: 2021-09-11 | Disposition: A | Discharge status: Home / Self Care | Payer: Medicare Other | Source: Ambulatory Visit | Attending: Internal Medicine | Admitting: Internal Medicine

## 2021-09-11 ENCOUNTER — Other Ambulatory Visit: Payer: Self-pay

## 2021-09-11 DIAGNOSIS — R911 Solitary pulmonary nodule: Secondary | ICD-10-CM

## 2021-09-11 DIAGNOSIS — R0600 Dyspnea, unspecified: Secondary | ICD-10-CM | POA: Diagnosis not present

## 2021-09-11 DIAGNOSIS — J479 Bronchiectasis, uncomplicated: Secondary | ICD-10-CM | POA: Diagnosis not present

## 2021-09-11 DIAGNOSIS — J439 Emphysema, unspecified: Secondary | ICD-10-CM | POA: Diagnosis not present

## 2021-09-11 DIAGNOSIS — J449 Chronic obstructive pulmonary disease, unspecified: Secondary | ICD-10-CM | POA: Diagnosis not present

## 2021-09-11 DIAGNOSIS — I700 Atherosclerosis of aorta: Secondary | ICD-10-CM | POA: Diagnosis not present

## 2021-09-12 ENCOUNTER — Other Ambulatory Visit: Payer: Self-pay | Admitting: Cardiology

## 2021-09-12 DIAGNOSIS — E7800 Pure hypercholesterolemia, unspecified: Secondary | ICD-10-CM

## 2021-09-13 ENCOUNTER — Encounter: Payer: Self-pay | Admitting: Internal Medicine

## 2021-09-13 ENCOUNTER — Other Ambulatory Visit: Payer: Self-pay | Admitting: Internal Medicine

## 2021-09-13 DIAGNOSIS — R9389 Abnormal findings on diagnostic imaging of other specified body structures: Secondary | ICD-10-CM

## 2021-09-13 DIAGNOSIS — I700 Atherosclerosis of aorta: Secondary | ICD-10-CM | POA: Insufficient documentation

## 2021-09-13 DIAGNOSIS — K8020 Calculus of gallbladder without cholecystitis without obstruction: Secondary | ICD-10-CM | POA: Insufficient documentation

## 2021-09-13 DIAGNOSIS — J9611 Chronic respiratory failure with hypoxia: Secondary | ICD-10-CM

## 2021-09-19 ENCOUNTER — Encounter: Payer: Self-pay | Admitting: *Deleted

## 2021-10-04 ENCOUNTER — Ambulatory Visit (INDEPENDENT_AMBULATORY_CARE_PROVIDER_SITE_OTHER)
Admission: RE | Admit: 2021-10-04 | Discharge: 2021-10-04 | Disposition: A | Discharge status: Home / Self Care | Payer: Medicare Other | Source: Ambulatory Visit | Attending: Nurse Practitioner | Admitting: Nurse Practitioner

## 2021-10-04 ENCOUNTER — Encounter: Payer: Self-pay | Admitting: *Deleted

## 2021-10-04 ENCOUNTER — Ambulatory Visit (INDEPENDENT_AMBULATORY_CARE_PROVIDER_SITE_OTHER): Payer: Medicare Other | Admitting: Nurse Practitioner

## 2021-10-04 ENCOUNTER — Encounter: Payer: Self-pay | Admitting: Nurse Practitioner

## 2021-10-04 ENCOUNTER — Other Ambulatory Visit: Payer: Self-pay

## 2021-10-04 VITALS — BP 124/60 | HR 85 | Temp 97.9°F | Resp 14 | Ht 69.0 in | Wt 144.4 lb

## 2021-10-04 DIAGNOSIS — J449 Chronic obstructive pulmonary disease, unspecified: Secondary | ICD-10-CM | POA: Diagnosis not present

## 2021-10-04 DIAGNOSIS — R112 Nausea with vomiting, unspecified: Secondary | ICD-10-CM

## 2021-10-04 DIAGNOSIS — R600 Localized edema: Secondary | ICD-10-CM | POA: Diagnosis not present

## 2021-10-04 DIAGNOSIS — J441 Chronic obstructive pulmonary disease with (acute) exacerbation: Secondary | ICD-10-CM | POA: Diagnosis not present

## 2021-10-04 DIAGNOSIS — R0689 Other abnormalities of breathing: Secondary | ICD-10-CM

## 2021-10-04 DIAGNOSIS — R197 Diarrhea, unspecified: Secondary | ICD-10-CM

## 2021-10-04 DIAGNOSIS — E876 Hypokalemia: Secondary | ICD-10-CM | POA: Diagnosis not present

## 2021-10-04 DIAGNOSIS — R062 Wheezing: Secondary | ICD-10-CM | POA: Diagnosis not present

## 2021-10-04 LAB — CBC WITH DIFFERENTIAL/PLATELET
Basophils Absolute: 0 10*3/uL (ref 0.0–0.1)
Basophils Relative: 0.6 % (ref 0.0–3.0)
Eosinophils Absolute: 0 10*3/uL (ref 0.0–0.7)
Eosinophils Relative: 0.6 % (ref 0.0–5.0)
HCT: 43.4 % (ref 39.0–52.0)
Hemoglobin: 14.5 g/dL (ref 13.0–17.0)
Lymphocytes Relative: 17.8 % (ref 12.0–46.0)
Lymphs Abs: 1.2 10*3/uL (ref 0.7–4.0)
MCHC: 33.5 g/dL (ref 30.0–36.0)
MCV: 98.8 fl (ref 78.0–100.0)
Monocytes Absolute: 0.9 10*3/uL (ref 0.1–1.0)
Monocytes Relative: 13.7 % — ABNORMAL HIGH (ref 3.0–12.0)
Neutro Abs: 4.4 10*3/uL (ref 1.4–7.7)
Neutrophils Relative %: 67.3 % (ref 43.0–77.0)
Platelets: 203 10*3/uL (ref 150.0–400.0)
RBC: 4.39 Mil/uL (ref 4.22–5.81)
RDW: 14.3 % (ref 11.5–15.5)
WBC: 6.5 10*3/uL (ref 4.0–10.5)

## 2021-10-04 LAB — COMPREHENSIVE METABOLIC PANEL
ALT: 31 U/L (ref 0–53)
AST: 59 U/L — ABNORMAL HIGH (ref 0–37)
Albumin: 3.6 g/dL (ref 3.5–5.2)
Alkaline Phosphatase: 46 U/L (ref 39–117)
BUN: 20 mg/dL (ref 6–23)
CO2: 39 mEq/L — ABNORMAL HIGH (ref 19–32)
Calcium: 9.6 mg/dL (ref 8.4–10.5)
Chloride: 93 mEq/L — ABNORMAL LOW (ref 96–112)
Creatinine, Ser: 1.05 mg/dL (ref 0.40–1.50)
GFR: 66.31 mL/min (ref >60.00)
Glucose, Bld: 90 mg/dL (ref 70–99)
Potassium: 3.1 mEq/L — ABNORMAL LOW (ref 3.5–5.1)
Sodium: 137 mEq/L (ref 135–145)
Total Bilirubin: 0.6 mg/dL (ref 0.2–1.2)
Total Protein: 6.8 g/dL (ref 6.0–8.3)

## 2021-10-04 LAB — POC COVID19 BINAXNOW: SARS Coronavirus 2 Ag: NEGATIVE

## 2021-10-04 LAB — POCT INFLUENZA A/B
Influenza A, POC: NEGATIVE
Influenza B, POC: NEGATIVE

## 2021-10-04 LAB — BRAIN NATRIURETIC PEPTIDE: Pro B Natriuretic peptide (BNP): 27 pg/mL (ref 0.0–100.0)

## 2021-10-04 MED ORDER — METHYLPREDNISOLONE ACETATE 40 MG/ML IJ SUSP
40.0000 mg | Freq: Once | INTRAMUSCULAR | Status: AC
  Administered 2021-10-04: 40 mg via INTRAMUSCULAR

## 2021-10-04 MED ORDER — AZITHROMYCIN 250 MG PO TABS: ORAL_TABLET | ORAL | 0 refills | Status: AC

## 2021-10-04 MED ORDER — ALBUTEROL SULFATE HFA 108 (90 BASE) MCG/ACT IN AERS: 2.0000 | INHALATION_SPRAY | Freq: Four times a day (QID) | RESPIRATORY_TRACT | 11 refills | Status: DC | PRN

## 2021-10-04 MED ORDER — ALBUTEROL SULFATE (2.5 MG/3ML) 0.083% IN NEBU
2.5000 mg | INHALATION_SOLUTION | Freq: Once | RESPIRATORY_TRACT | Status: AC
Start: 2021-10-04 — End: 2021-10-04
  Administered 2021-10-04: 2.5 mg via RESPIRATORY_TRACT

## 2021-10-04 MED ORDER — POTASSIUM CHLORIDE CRYS ER 20 MEQ PO TBCR: 20.0000 meq | EXTENDED_RELEASE_TABLET | Freq: Every day | ORAL | 0 refills | Status: DC

## 2021-10-04 MED ORDER — METHYLPREDNISOLONE ACETATE 40 MG/ML IJ SUSP: 40.0000 mg | Freq: Once | INTRAMUSCULAR | Status: DC

## 2021-10-04 MED ORDER — PREDNISONE 20 MG PO TABS: ORAL_TABLET | ORAL | 0 refills | Status: DC

## 2021-10-04 MED ORDER — PREDNISONE 20 MG PO TABS: 40.0000 mg | ORAL_TABLET | Freq: Every day | ORAL | 0 refills | Status: AC

## 2021-10-04 NOTE — Patient Instructions (Signed)
Nice to see you today Will be in touch with xray results and lab results Continue to get food and water in as you can tolerate Follow up next week with your PCP

## 2021-10-04 NOTE — Assessment & Plan Note (Signed)
Per patient report has resolved

## 2021-10-04 NOTE — Assessment & Plan Note (Signed)
Patient found to have hyperkalemia.  Likely secondary to nausea vomiting and diarrhea this is resolved.  We will send in 20 mEq of potassium for 3 days to add to his already daily 20 mEq potassium.  Did discuss this with patient's son he also need to follow-up next week with his primary care provider Dr. Yetta Barre for recheck

## 2021-10-04 NOTE — Assessment & Plan Note (Signed)
Patient experiencing a COPD exacerbation.  We will cover him with prednisone and antibiotic.  Discussed this with patient strict precautions discussed with him and his son asked when to seek urgent or emergent health care acknowledged.

## 2021-10-04 NOTE — Progress Notes (Signed)
Acute Office Visit  Subjective:    Patient ID: [name], male    DOB: 04-19-1941, 80 y.o.   MRN: [id number]  Chief Complaint  Patient presents with   Diarrhea    X 2 days, vomited 2 times, chills, stuffy nose, cough-irritated throat. Covid test negativeon 10/03/21. Has been unsteady on his feet since this started.     Patient is in today for Diarrhea and vomiting  Night before last episode of diarrhea Has been feeling bad for the past 3-4 days Unsteady on his feet, last time he had an infection and was treated and symptoms resolved  No covid vaccine No flu vaccine No sick contact Approx 7 times for vomitng Diarrhea approx 5   Former smoker does have oxygen at home that he wears. 2L Past Medical History:  Diagnosis Date   Asthma    as a child   Carotid arterial disease (HCC)    a. 11/2015 Carotid U/S: LICA 40-59%, RICA 100 CTO, nl subclavian arteries--f/u 1 yr.   Coronary atherosclerosis of native coronary artery    a. 09/2001 inflat MI/PCI: RCA 8m (3.0x18 AVE S7 BMS); b. 07/2006 Cath: LM nl, LAD 50p, LCX min irregs, OM1 large, min irregs, OM2 min irregs, RCA 20 ISR;  c. 10/2012 MV: EF 74%, no ischemia.   Essential hypertension    Heart murmur    since a child   Hyperlipidemia    Myocardial infarction [facility]) 2002   Pneumonia    age 28   Right inguinal hernia     Past Surgical History:  Procedure Laterality Date   CARDIAC CATHETERIZATION     Carotid stents     COLONOSCOPY     FRACTURE SURGERY     HERNIA REPAIR     ventral   INGUINAL HERNIA REPAIR Right 11/01/2016   Procedure: RIGHT INGUINAL HERNIA REPAIR WITH MESH;  Surgeon: Avel Peace, MD;  Location: [facility] OR;  Service: General;  Laterality: Right;   INSERTION OF MESH Right 11/01/2016   Procedure: INSERTION OF MESH;  Surgeon: Avel Peace, MD;  Location: [facility] OR;  Service: General;  Laterality: Right;   ORIF SHOULDER FRACTURE Right     Family History  Problem Relation Age of Onset   Hypertension Other      Social History   Socioeconomic History   Marital status: Married    Spouse name: Not on file   Number of children: Not on file   Years of education: Not on file   Highest education level: Not on file  Occupational History    Employer: RETIRED  Tobacco Use   Smoking status: Former    Packs/day: 1.00    Years: 44.00    Pack years: 44.00    Types: Cigarettes    Quit date: 10/28/1998    Years since quitting: 22.9   Smokeless tobacco: Former    Types: Chew   Tobacco comments:    quit chewing tobacco 2001 ish  Substance and Sexual Activity   Alcohol use: No    Alcohol/week: 0.0 standard drinks   Drug use: No   Sexual activity: Not on file  Other Topics Concern   Not on file  Social History Narrative   Lives in [place] with his wife.   Social Determinants of Health   Financial Resource Strain: Not on file  Food Insecurity: Not on file  Transportation Needs: Not on file  Physical Activity: Not on file  Stress: Not on file  Social Connections: Not on  file  Intimate Partner Violence: Not on file    Outpatient Medications Prior to Visit  Medication Sig Dispense Refill   albuterol (PROVENTIL) (2.5 MG/3ML) 0.083% nebulizer solution USE 1 VIAL VIA NEBULIZER EVERY 6 HOURS AS NEEDED FOR WHEEZING OR SHORTNESS OF BREATH 75 mL 3   albuterol (VENTOLIN HFA) 108 (90 Base) MCG/ACT inhaler Inhale 2 puffs into the lungs every 6 (six) hours as needed for wheezing or shortness of breath. 8 g 11   aspirin EC 81 MG EC tablet Take 1 tablet (81 mg total) by mouth daily. 30 tablet 0   ezetimibe (ZETIA) 10 MG tablet TAKE 1 TABLET(10 MG) BY MOUTH DAILY 30 tablet 0   hydrochlorothiazide (MICROZIDE) 12.5 MG capsule TAKE 1 CAPSULE BY MOUTH DAILY 90 capsule 0   potassium chloride SA (KLOR-CON) 20 MEQ tablet Take 1 tablet (20 mEq total) by mouth daily. 90 tablet 0   rosuvastatin (CRESTOR) 40 MG tablet TAKE 1 TABLET(40 MG) BY MOUTH DAILY 90 tablet 0   Tiotropium Bromide-Olodaterol (STIOLTO  RESPIMAT) 2.5-2.5 MCG/ACT AERS Inhale 2 puffs into the lungs daily. 3 Inhaler 1   azithromycin (ZITHROMAX) 250 MG tablet Take 1 tablet (250 mg total) by mouth daily. Take first 2 tablets together, then 1 every day until finished. (Patient not taking: Reported on 08/16/2021) 6 tablet 0   No facility-administered medications prior to visit.    Allergies  Allergen Reactions   Penicillins Hives and Swelling    Has patient had a PCN reaction causing immediate rash, facial/tongue/throat swelling, SOB or lightheadedness with hypotension: Yes Has patient had a PCN reaction causing severe rash involving mucus membranes or skin necrosis: Yes Has patient had a PCN reaction that required hospitalization No Has patient had a PCN reaction occurring within the last 10 years: No If all of the above answers are "NO", then may proceed with Cephalosporin use.     Review of Systems  Constitutional:  Positive for chills. Negative for fatigue and fever.  HENT:  Negative for congestion, ear discharge, ear pain, sinus pressure, sinus pain and sore throat.   Respiratory:  Positive for cough. Negative for shortness of breath.   Cardiovascular:  Negative for chest pain.  Gastrointestinal:  Positive for diarrhea and vomiting. Negative for constipation and nausea.  Musculoskeletal:  Negative for arthralgias and myalgias.  Neurological:  Negative for dizziness, weakness, light-headedness, numbness and headaches.      Objective:    Physical Exam Vitals and nursing note reviewed.  HENT:     Right Ear: Tympanic membrane, ear canal and external ear normal.     Left Ear: Tympanic membrane, ear canal and external ear normal.     Nose:     Right Sinus: No maxillary sinus tenderness or frontal sinus tenderness.     Left Sinus: No maxillary sinus tenderness or frontal sinus tenderness.  Cardiovascular:     Rate and Rhythm: Normal rate and regular rhythm.     Pulses: Normal pulses.     Heart sounds: Normal heart  sounds.  Pulmonary:     Effort: Pulmonary effort is normal. No respiratory distress.     Breath sounds: Wheezing and rhonchi present.  Abdominal:     General: Bowel sounds are normal.  Musculoskeletal:     Right lower leg: Edema present.     Left lower leg: Edema present.  Neurological:     General: No focal deficit present.     Mental Status: He is alert and oriented to person, place, and time.  Cranial Nerves: No cranial nerve deficit.     Sensory: No sensory deficit.     Motor: No weakness.     Coordination: Coordination normal.     Gait: Gait normal.     Deep Tendon Reflexes: Reflexes normal.  Psychiatric:        Mood and Affect: Mood normal.        Behavior: Behavior normal.        Thought Content: Thought content normal.        Judgment: Judgment normal.    BP 124/60   Pulse 85   Temp 97.9 F (36.6 C)   Resp 14   Ht 5\' 9"  (1.753 m)   Wt 144 lb 6 oz (65.5 kg)   SpO2 (!) 88%   BMI 21.32 kg/m  Wt Readings from Last 3 Encounters:  10/04/21 144 lb 6 oz (65.5 kg)  08/16/21 144 lb (65.3 kg)  04/18/20 160 lb 12.8 oz (72.9 kg)    Health Maintenance Due  Topic Date Due   Zoster Vaccines- Shingrix (1 of 2) Never done   Pneumonia Vaccine 54+ Years old (2 - PCV) 03/31/2020    There are no preventive care reminders to display for this patient.   Lab Results  Component Value Date   TSH 0.79 08/16/2021   Lab Results  Component Value Date   WBC 14.2 (H) 08/16/2021   HGB 14.5 08/16/2021   HCT 44.7 08/16/2021   MCV 99.8 08/16/2021   PLT 256.0 08/16/2021   Lab Results  Component Value Date   NA 138 08/16/2021   K 3.9 08/16/2021   CO2 39 (H) 08/16/2021   GLUCOSE 121 (H) 08/16/2021   BUN 22 08/16/2021   CREATININE 1.35 08/16/2021   BILITOT 0.4 08/16/2021   ALKPHOS 57 08/16/2021   AST 24 08/16/2021   ALT 16 08/16/2021   PROT 6.7 08/16/2021   ALBUMIN 3.7 08/16/2021   CALCIUM 9.9 08/16/2021   ANIONGAP 15 06/04/2020   GFR 49.09 (L) 08/16/2021   Lab  Results  Component Value Date   CHOL 88 08/16/2021   Lab Results  Component Value Date   HDL 36.50 (L) 08/16/2021   Lab Results  Component Value Date   LDLCALC 36 08/16/2021   Lab Results  Component Value Date   TRIG 73.0 08/16/2021   Lab Results  Component Value Date   CHOLHDL 2 08/16/2021   Lab Results  Component Value Date   HGBA1C 7.6 (H) 08/16/2021       Assessment & Plan:   Problem List Items Addressed This Visit       Respiratory   COPD exacerbation (HCC)    Patient experiencing a COPD exacerbation.  We will cover him with prednisone and antibiotic.  Discussed this with patient strict precautions discussed with him and his son asked when to seek urgent or emergent health care acknowledged.      Relevant Medications   albuterol (VENTOLIN HFA) 108 (90 Base) MCG/ACT inhaler   predniSONE (DELTASONE) 20 MG tablet   azithromycin (ZITHROMAX) 250 MG tablet     Digestive   Nausea and vomiting - Primary    Per patient report has resolved      Relevant Orders   Comprehensive metabolic panel (Completed)   CBC with Differential/Platelet (Completed)   POC COVID-19 (Completed)   POCT Influenza A/B (Completed)     Other   Hypokalemia    Patient found to have hyperkalemia.  Likely secondary to nausea vomiting and diarrhea this is  resolved.  We will send in 20 mEq of potassium for 3 days to add to his already daily 20 mEq potassium.  Did discuss this with patient's son he also need to follow-up next week with his primary care provider Dr. Yetta Barre for recheck      Relevant Medications   potassium chloride SA (KLOR-CON M) 20 MEQ tablet   Diarrhea    Per patient report has resolved      Relevant Orders   Comprehensive metabolic panel (Completed)   CBC with Differential/Platelet (Completed)   POC COVID-19 (Completed)   POCT Influenza A/B (Completed)   Bilateral lower extremity edema   Relevant Orders   Brain natriuretic peptide (Completed)   Other Visit  Diagnoses     Adventitious breath sounds       Relevant Medications   albuterol (PROVENTIL) (2.5 MG/3ML) 0.083% nebulizer solution 2.5 mg (Completed)   albuterol (VENTOLIN HFA) 108 (90 Base) MCG/ACT inhaler   methylPREDNISolone acetate (DEPO-MEDROL) injection 40 mg (Completed)   predniSONE (DELTASONE) 20 MG tablet   Other Relevant Orders   DG Chest 2 View (Completed)        No orders of the defined types were placed in this encounter.  This visit occurred during the SARS-CoV-2 public health emergency.  Safety protocols were in place, including screening questions prior to the visit, additional usage of staff PPE, and extensive cleaning of exam room while observing appropriate contact time as indicated for disinfecting solutions.   Audria Nine, NP

## 2021-10-10 ENCOUNTER — Other Ambulatory Visit: Payer: Self-pay | Admitting: Cardiology

## 2021-10-11 ENCOUNTER — Other Ambulatory Visit: Payer: Self-pay | Admitting: Internal Medicine

## 2021-10-11 DIAGNOSIS — J449 Chronic obstructive pulmonary disease, unspecified: Secondary | ICD-10-CM

## 2021-11-06 ENCOUNTER — Other Ambulatory Visit: Payer: Self-pay

## 2021-11-06 ENCOUNTER — Encounter: Payer: Self-pay | Admitting: Pulmonary Disease

## 2021-11-06 ENCOUNTER — Ambulatory Visit (INDEPENDENT_AMBULATORY_CARE_PROVIDER_SITE_OTHER): Payer: Medicare Other | Admitting: Pulmonary Disease

## 2021-11-06 VITALS — BP 126/64 | HR 75 | Temp 98.4°F | Ht 69.0 in | Wt 141.8 lb

## 2021-11-06 DIAGNOSIS — J441 Chronic obstructive pulmonary disease with (acute) exacerbation: Secondary | ICD-10-CM

## 2021-11-06 DIAGNOSIS — J9611 Chronic respiratory failure with hypoxia: Secondary | ICD-10-CM

## 2021-11-09 ENCOUNTER — Other Ambulatory Visit: Payer: Self-pay | Admitting: Cardiology

## 2021-11-15 NOTE — Progress Notes (Signed)
@Patient  ID: [name], male    DOB: Oct 11, 1941, 81 y.o.   MRN: [id number]  Chief Complaint  Patient presents with   Consult    Consult for low oxygen levels that have been occurring for off and on for 2 years. Has had a recent CT scan in November.     Referring provider: Etta Grandchild, MD  HPI:   81 y.o. man with severe COPD based on PFTs 09/2016 whom we are seeing in consultation for evaluation of hypoxemic respiratory failure.  Most recent pulmonary note 2017 reviewed.  Note from referring provider reviewed.  Patient with less underlying stabilized here.  Does not seem to be very accepting of medical advice today.  Accompanied by son.  Has been on oxygen for a while.  Uses, with exertion.  Not routinely.  Family feels like it.  Dyspnea at baseline.  No worse.  Not particularly bothersome.  Worse on inclines or stairs.  Recently seen in acute visit at PCP office 10/04/2021.  Note reviewed.  Presumed COPD exacerbation at time.  Placed on prednisone and azithromycin.  Prescribed albuterol inhaler.  Denies using maintenance inhaler.  Most recent chest imaging 10/04/2021 chest x-ray reviewed interpreted clear lungs bilaterally.  Most recent CT chest 09/11/2021 reviewed interpreted as severe emphysema throughout.  PMH: Tobacco abuse in remission, hypertension, hyperlipidemia Surgical history: Cardiac stent, inguinal hernia repair Family history: No significant Restoril severity relatives per his report Social history: Lives in [place], former smoker, 45-pack-year history   Questionaires / Pulmonary Flowsheets:   ACT:  No flowsheet data found.  MMRC: No flowsheet data found.  Epworth:  No flowsheet data found.  Tests:   FENO:  No results found for: NITRICOXIDE  PFT: Personally reviewed and remains with severe mixed obstruction on spirometry  WALK:  No flowsheet data found.  Imaging: Personally reviewed and as per EMR discussion this note   Lab  Results: Personally reviewed CBC    Component Value Date/Time   WBC 6.5 10/04/2021 0916   RBC 4.39 10/04/2021 0916   HGB 14.5 10/04/2021 0916   HCT 43.4 10/04/2021 0916   PLT 203.0 10/04/2021 0916   MCV 98.8 10/04/2021 0916   MCH 32.6 06/04/2020 1620   MCHC 33.5 10/04/2021 0916   RDW 14.3 10/04/2021 0916   LYMPHSABS 1.2 10/04/2021 0916   MONOABS 0.9 10/04/2021 0916   EOSABS 0.0 10/04/2021 0916   BASOSABS 0.0 10/04/2021 0916    BMET    Component Value Date/Time   NA 137 10/04/2021 0916   NA 142 04/26/2020 1417   K 3.1 (L) 10/04/2021 0916   CL 93 (L) 10/04/2021 0916   CO2 39 (H) 10/04/2021 0916   GLUCOSE 90 10/04/2021 0916   BUN 20 10/04/2021 0916   BUN 19 04/26/2020 1417   CREATININE 1.05 10/04/2021 0916   CREATININE 0.96 11/27/2015 1118   CALCIUM 9.6 10/04/2021 0916   GFRNONAA 49 (L) 06/04/2020 1617   GFRNONAA 72 11/30/2014 0840   GFRAA 57 (L) 06/04/2020 1617   GFRAA 83 11/30/2014 0840    BNP No results found for: BNP  ProBNP    Component Value Date/Time   PROBNP 27.0 10/04/2021 0916    Specialty Problems       Pulmonary Problems   Moderate persistent asthma without complication    Chronic respiratory alkalosis with compensation as evidenced by an elevated bicarbonate      Chronic respiratory failure with hypercapnia (HCC)    HC03  09/04/16  = 37  COPD III/ hypercarbic    Spirometry 10/07/2016  FEV1 1.33 (47%)  Ratio 50   - 10/07/2016  After extensive coaching HFA effectiveness =   75% > stiolto trial       Chronic hypoxemic respiratory failure (HCC)   Pulmonary nodule   COPD exacerbation (HCC)    Allergies  Allergen Reactions   Penicillins Hives and Swelling    Has patient had a PCN reaction causing immediate rash, facial/tongue/throat swelling, SOB or lightheadedness with hypotension: Yes Has patient had a PCN reaction causing severe rash involving mucus membranes or skin necrosis: Yes Has patient had a PCN reaction that required  hospitalization No Has patient had a PCN reaction occurring within the last 10 years: No If all of the above answers are "NO", then may proceed with Cephalosporin use.     Immunization History  Administered Date(s) Administered   Pneumococcal Polysaccharide-23 04/01/2019   Td 12/30/2000   Tdap 04/01/2019    Past Medical History:  Diagnosis Date   Asthma    as a child   Carotid arterial disease (HCC)    a. 11/2015 Carotid U/S: LICA 40-59%, RICA 100 CTO, nl subclavian arteries--f/u 1 yr.   Coronary atherosclerosis of native coronary artery    a. 09/2001 inflat MI/PCI: RCA 75m (3.0x18 AVE S7 BMS); b. 07/2006 Cath: LM nl, LAD 50p, LCX min irregs, OM1 large, min irregs, OM2 min irregs, RCA 20 ISR;  c. 10/2012 MV: EF 74%, no ischemia.   Essential hypertension    Heart murmur    since a child   Hyperlipidemia    Myocardial infarction (HCC) 2002   Pneumonia    age 70   Right inguinal hernia     Tobacco History: Social History   Tobacco Use  Smoking Status Former   Packs/day: 1.00   Years: 44.00   Pack years: 44.00   Types: Cigarettes   Quit date: 10/28/1998   Years since quitting: 23.0  Smokeless Tobacco Former   Types: Chew  Tobacco Comments   quit chewing tobacco 2001 ish   Counseling given: Not Answered Tobacco comments: quit chewing tobacco 2001 ish   Continue to not smoke  Outpatient Encounter Medications as of 11/06/2021  Medication Sig   albuterol (PROVENTIL) (2.5 MG/3ML) 0.083% nebulizer solution INHALE THE CONTENTS OF 1 VIAL INTO THE LUNGS VIA NEBULIZER EVERY 6 HOURS AS NEEDED FOR WHEEZING OR SHORTNESS OF BREATH   albuterol (VENTOLIN HFA) 108 (90 Base) MCG/ACT inhaler Inhale 2 puffs into the lungs every 6 (six) hours as needed for wheezing or shortness of breath.   aspirin EC 81 MG EC tablet Take 1 tablet (81 mg total) by mouth daily.   ezetimibe (ZETIA) 10 MG tablet TAKE 1 TABLET(10 MG) BY MOUTH DAILY   hydrochlorothiazide (MICROZIDE) 12.5 MG capsule TAKE 1  CAPSULE BY MOUTH DAILY   rosuvastatin (CRESTOR) 40 MG tablet TAKE 1 TABLET(40 MG) BY MOUTH DAILY   [DISCONTINUED] potassium chloride SA (KLOR-CON) 20 MEQ tablet Take 1 tablet (20 mEq total) by mouth daily.   [DISCONTINUED] Tiotropium Bromide-Olodaterol (STIOLTO RESPIMAT) 2.5-2.5 MCG/ACT AERS Inhale 2 puffs into the lungs daily.   [DISCONTINUED] potassium chloride SA (KLOR-CON M) 20 MEQ tablet Take 1 tablet (20 mEq total) by mouth daily for 3 days. If you take the other potassium in the morning take this on in the evening   No facility-administered encounter medications on file as of 11/06/2021.     Review of Systems  Review of Systems  No chest pain with  exertion.  No orthopnea or PND.  Comprehensive review of systems otherwise negative. Physical Exam  BP 126/64 (BP Location: Left Arm, Patient Position: Sitting, Cuff Size: Normal)    Pulse 75    Temp 98.4 F (36.9 C) (Oral)    Ht 5\' 9"  (1.753 m)    Wt 141 lb 12.8 oz (64.3 kg)    SpO2 94%    BMI 20.94 kg/m   Wt Readings from Last 5 Encounters:  11/06/21 141 lb 12.8 oz (64.3 kg)  10/04/21 144 lb 6 oz (65.5 kg)  08/16/21 144 lb (65.3 kg)  04/18/20 160 lb 12.8 oz (72.9 kg)  04/01/19 166 lb (75.3 kg)    BMI Readings from Last 5 Encounters:  11/06/21 20.94 kg/m  10/04/21 21.32 kg/m  08/16/21 21.27 kg/m  04/18/20 23.75 kg/m  04/01/19 24.51 kg/m     Physical Exam General: Well-appearing, no acute distress Eyes: EOMI, icterus Neck: Supple, no JVP Pulmonary: Clear, distant, no work of breathing Cardiovascular: Regular in rhythm, no murmur Abdomen: Nondistended, bowel sounds present MSK: No synovitis, no joint effusion Neuro: Normal gait, no weakness Psych: Normal mood, full affect   Assessment & Plan:    Chronic hypoxemic respiratory failure: counseled at length needed to keep oxygen saturation greater than 88% at rest and exertion.   COPD: Based on PFTs from 02/2016, severe.  Recent exacerbation.  Counseled on  importance of triple inhaled therapy to prevent exacerbation in the future.  He declines additional medications today  No follow-ups on file.  Patient does not desire to follow up in pulmonary Clinic at this time.   03/2016, MD 11/15/2021

## 2021-11-29 ENCOUNTER — Other Ambulatory Visit (HOSPITAL_COMMUNITY): Payer: Self-pay | Admitting: Cardiology

## 2021-11-29 DIAGNOSIS — I6523 Occlusion and stenosis of bilateral carotid arteries: Secondary | ICD-10-CM

## 2021-12-06 ENCOUNTER — Other Ambulatory Visit: Payer: Self-pay

## 2021-12-06 DIAGNOSIS — E7800 Pure hypercholesterolemia, unspecified: Secondary | ICD-10-CM

## 2021-12-06 MED ORDER — EZETIMIBE 10 MG PO TABS: ORAL_TABLET | ORAL | 0 refills | Status: DC

## 2021-12-13 ENCOUNTER — Other Ambulatory Visit: Payer: Self-pay

## 2021-12-13 ENCOUNTER — Ambulatory Visit (HOSPITAL_COMMUNITY)
Admission: RE | Admit: 2021-12-13 | Discharge: 2021-12-13 | Disposition: A | Discharge status: Home / Self Care | Payer: Medicare Other | Source: Ambulatory Visit | Attending: Cardiology | Admitting: Cardiology

## 2021-12-13 DIAGNOSIS — I6523 Occlusion and stenosis of bilateral carotid arteries: Secondary | ICD-10-CM | POA: Diagnosis not present

## 2021-12-17 ENCOUNTER — Encounter: Payer: Self-pay | Admitting: *Deleted

## 2022-01-14 ENCOUNTER — Other Ambulatory Visit: Payer: Self-pay

## 2022-01-14 MED ORDER — ROSUVASTATIN CALCIUM 40 MG PO TABS: ORAL_TABLET | ORAL | 0 refills | Status: DC

## 2022-02-05 ENCOUNTER — Other Ambulatory Visit: Payer: Self-pay | Admitting: *Deleted

## 2022-02-05 DIAGNOSIS — R911 Solitary pulmonary nodule: Secondary | ICD-10-CM

## 2022-02-11 ENCOUNTER — Other Ambulatory Visit: Payer: Self-pay | Admitting: Internal Medicine

## 2022-02-11 DIAGNOSIS — J449 Chronic obstructive pulmonary disease, unspecified: Secondary | ICD-10-CM

## 2022-04-27 ENCOUNTER — Other Ambulatory Visit: Payer: Self-pay

## 2022-04-27 ENCOUNTER — Emergency Department (HOSPITAL_COMMUNITY): Payer: Medicare Other

## 2022-04-27 ENCOUNTER — Inpatient Hospital Stay (HOSPITAL_COMMUNITY): Payer: Medicare Other

## 2022-04-27 ENCOUNTER — Inpatient Hospital Stay (HOSPITAL_COMMUNITY)
Admission: EM | Admit: 2022-04-27 | Discharge: 2022-05-03 | DRG: 981 | Disposition: A | Discharge status: Home Health | Payer: Medicare Other | Attending: Family Medicine | Admitting: Family Medicine

## 2022-04-27 DIAGNOSIS — S31119A Laceration without foreign body of abdominal wall, unspecified quadrant without penetration into peritoneal cavity, initial encounter: Secondary | ICD-10-CM | POA: Diagnosis present

## 2022-04-27 DIAGNOSIS — W19XXXA Unspecified fall, initial encounter: Secondary | ICD-10-CM

## 2022-04-27 DIAGNOSIS — E876 Hypokalemia: Secondary | ICD-10-CM | POA: Diagnosis not present

## 2022-04-27 DIAGNOSIS — E860 Dehydration: Secondary | ICD-10-CM | POA: Diagnosis present

## 2022-04-27 DIAGNOSIS — I602 Nontraumatic subarachnoid hemorrhage from anterior communicating artery: Secondary | ICD-10-CM | POA: Diagnosis present

## 2022-04-27 DIAGNOSIS — W010XXA Fall on same level from slipping, tripping and stumbling without subsequent striking against object, initial encounter: Secondary | ICD-10-CM | POA: Diagnosis present

## 2022-04-27 DIAGNOSIS — Y92009 Unspecified place in unspecified non-institutional (private) residence as the place of occurrence of the external cause: Secondary | ICD-10-CM

## 2022-04-27 DIAGNOSIS — I739 Peripheral vascular disease, unspecified: Secondary | ICD-10-CM | POA: Diagnosis present

## 2022-04-27 DIAGNOSIS — F17210 Nicotine dependence, cigarettes, uncomplicated: Secondary | ICD-10-CM | POA: Diagnosis present

## 2022-04-27 DIAGNOSIS — I10 Essential (primary) hypertension: Secondary | ICD-10-CM | POA: Diagnosis present

## 2022-04-27 DIAGNOSIS — I252 Old myocardial infarction: Secondary | ICD-10-CM

## 2022-04-27 DIAGNOSIS — R270 Ataxia, unspecified: Secondary | ICD-10-CM | POA: Diagnosis present

## 2022-04-27 DIAGNOSIS — I6521 Occlusion and stenosis of right carotid artery: Secondary | ICD-10-CM | POA: Diagnosis not present

## 2022-04-27 DIAGNOSIS — G9341 Metabolic encephalopathy: Secondary | ICD-10-CM | POA: Diagnosis not present

## 2022-04-27 DIAGNOSIS — R4182 Altered mental status, unspecified: Secondary | ICD-10-CM

## 2022-04-27 DIAGNOSIS — Z20822 Contact with and (suspected) exposure to covid-19: Secondary | ICD-10-CM | POA: Diagnosis present

## 2022-04-27 DIAGNOSIS — Z634 Disappearance and death of family member: Secondary | ICD-10-CM

## 2022-04-27 DIAGNOSIS — E8809 Other disorders of plasma-protein metabolism, not elsewhere classified: Secondary | ICD-10-CM | POA: Diagnosis present

## 2022-04-27 DIAGNOSIS — Z720 Tobacco use: Secondary | ICD-10-CM

## 2022-04-27 DIAGNOSIS — L89152 Pressure ulcer of sacral region, stage 2: Secondary | ICD-10-CM | POA: Diagnosis present

## 2022-04-27 DIAGNOSIS — Z681 Body mass index (BMI) 19 or less, adult: Secondary | ICD-10-CM

## 2022-04-27 DIAGNOSIS — J441 Chronic obstructive pulmonary disease with (acute) exacerbation: Principal | ICD-10-CM | POA: Diagnosis present

## 2022-04-27 DIAGNOSIS — Z950 Presence of cardiac pacemaker: Secondary | ICD-10-CM | POA: Diagnosis not present

## 2022-04-27 DIAGNOSIS — E785 Hyperlipidemia, unspecified: Secondary | ICD-10-CM | POA: Diagnosis present

## 2022-04-27 DIAGNOSIS — E43 Unspecified severe protein-calorie malnutrition: Secondary | ICD-10-CM | POA: Diagnosis present

## 2022-04-27 DIAGNOSIS — F05 Delirium due to known physiological condition: Secondary | ICD-10-CM | POA: Diagnosis not present

## 2022-04-27 DIAGNOSIS — I6523 Occlusion and stenosis of bilateral carotid arteries: Secondary | ICD-10-CM | POA: Diagnosis present

## 2022-04-27 DIAGNOSIS — R531 Weakness: Secondary | ICD-10-CM | POA: Diagnosis not present

## 2022-04-27 DIAGNOSIS — I442 Atrioventricular block, complete: Secondary | ICD-10-CM | POA: Diagnosis not present

## 2022-04-27 DIAGNOSIS — L602 Onychogryphosis: Secondary | ICD-10-CM | POA: Diagnosis not present

## 2022-04-27 DIAGNOSIS — R2990 Unspecified symptoms and signs involving the nervous system: Secondary | ICD-10-CM

## 2022-04-27 DIAGNOSIS — N179 Acute kidney failure, unspecified: Secondary | ICD-10-CM | POA: Diagnosis not present

## 2022-04-27 DIAGNOSIS — Z7982 Long term (current) use of aspirin: Secondary | ICD-10-CM

## 2022-04-27 DIAGNOSIS — F32A Depression, unspecified: Secondary | ICD-10-CM | POA: Diagnosis present

## 2022-04-27 DIAGNOSIS — L8990 Pressure ulcer of unspecified site, unspecified stage: Secondary | ICD-10-CM | POA: Insufficient documentation

## 2022-04-27 DIAGNOSIS — G9340 Encephalopathy, unspecified: Secondary | ICD-10-CM | POA: Diagnosis present

## 2022-04-27 DIAGNOSIS — Z880 Allergy status to penicillin: Secondary | ICD-10-CM

## 2022-04-27 DIAGNOSIS — I2510 Atherosclerotic heart disease of native coronary artery without angina pectoris: Secondary | ICD-10-CM | POA: Diagnosis present

## 2022-04-27 DIAGNOSIS — D638 Anemia in other chronic diseases classified elsewhere: Secondary | ICD-10-CM | POA: Diagnosis present

## 2022-04-27 DIAGNOSIS — E7800 Pure hypercholesterolemia, unspecified: Secondary | ICD-10-CM

## 2022-04-27 DIAGNOSIS — R627 Adult failure to thrive: Secondary | ICD-10-CM | POA: Diagnosis present

## 2022-04-27 DIAGNOSIS — J439 Emphysema, unspecified: Secondary | ICD-10-CM | POA: Diagnosis not present

## 2022-04-27 DIAGNOSIS — I6501 Occlusion and stenosis of right vertebral artery: Secondary | ICD-10-CM | POA: Diagnosis not present

## 2022-04-27 DIAGNOSIS — R296 Repeated falls: Secondary | ICD-10-CM | POA: Diagnosis not present

## 2022-04-27 DIAGNOSIS — Z66 Do not resuscitate: Secondary | ICD-10-CM | POA: Diagnosis present

## 2022-04-27 DIAGNOSIS — E875 Hyperkalemia: Secondary | ICD-10-CM | POA: Diagnosis present

## 2022-04-27 DIAGNOSIS — G319 Degenerative disease of nervous system, unspecified: Secondary | ICD-10-CM | POA: Diagnosis not present

## 2022-04-27 DIAGNOSIS — Z9981 Dependence on supplemental oxygen: Secondary | ICD-10-CM

## 2022-04-27 DIAGNOSIS — R319 Hematuria, unspecified: Secondary | ICD-10-CM | POA: Diagnosis not present

## 2022-04-27 DIAGNOSIS — E119 Type 2 diabetes mellitus without complications: Secondary | ICD-10-CM | POA: Diagnosis present

## 2022-04-27 DIAGNOSIS — R29818 Other symptoms and signs involving the nervous system: Secondary | ICD-10-CM | POA: Diagnosis not present

## 2022-04-27 DIAGNOSIS — Z8249 Family history of ischemic heart disease and other diseases of the circulatory system: Secondary | ICD-10-CM

## 2022-04-27 DIAGNOSIS — Z87891 Personal history of nicotine dependence: Secondary | ICD-10-CM

## 2022-04-27 DIAGNOSIS — Z95828 Presence of other vascular implants and grafts: Secondary | ICD-10-CM

## 2022-04-27 DIAGNOSIS — Z955 Presence of coronary angioplasty implant and graft: Secondary | ICD-10-CM

## 2022-04-27 DIAGNOSIS — Z91199 Patient's noncompliance with other medical treatment and regimen due to unspecified reason: Secondary | ICD-10-CM

## 2022-04-27 DIAGNOSIS — R0902 Hypoxemia: Secondary | ICD-10-CM | POA: Diagnosis not present

## 2022-04-27 DIAGNOSIS — R001 Bradycardia, unspecified: Secondary | ICD-10-CM

## 2022-04-27 DIAGNOSIS — Z79899 Other long term (current) drug therapy: Secondary | ICD-10-CM

## 2022-04-27 LAB — RAPID URINE DRUG SCREEN, HOSP PERFORMED
Amphetamines: NOT DETECTED
Barbiturates: NOT DETECTED
Benzodiazepines: NOT DETECTED
Cocaine: NOT DETECTED
Opiates: NOT DETECTED
Tetrahydrocannabinol: NOT DETECTED

## 2022-04-27 LAB — CK: Total CK: 838 U/L — ABNORMAL HIGH (ref 49–397)

## 2022-04-27 LAB — CBC
HCT: 41.9 % (ref 39.0–52.0)
Hemoglobin: 13.9 g/dL (ref 13.0–17.0)
MCH: 31.2 pg (ref 26.0–34.0)
MCHC: 33.2 g/dL (ref 30.0–36.0)
MCV: 94.2 fL (ref 80.0–100.0)
Platelets: 242 10*3/uL (ref 150–400)
RBC: 4.45 MIL/uL (ref 4.22–5.81)
RDW: 16.2 % — ABNORMAL HIGH (ref 11.5–15.5)
WBC: 5.3 10*3/uL (ref 4.0–10.5)
nRBC: 0 % (ref 0.0–0.2)

## 2022-04-27 LAB — COMPREHENSIVE METABOLIC PANEL
ALT: 27 U/L (ref 0–44)
AST: 41 U/L (ref 15–41)
Albumin: 2.2 g/dL — ABNORMAL LOW (ref 3.5–5.0)
Alkaline Phosphatase: 84 U/L (ref 38–126)
Anion gap: 14 (ref 5–15)
BUN: 15 mg/dL (ref 8–23)
CO2: 27 mmol/L (ref 22–32)
Calcium: 8.4 mg/dL — ABNORMAL LOW (ref 8.9–10.3)
Chloride: 96 mmol/L — ABNORMAL LOW (ref 98–111)
Creatinine, Ser: 1.17 mg/dL (ref 0.61–1.24)
GFR, Estimated: >60 mL/min (ref >60)
Glucose, Bld: 96 mg/dL (ref 70–99)
Potassium: 5.5 mmol/L — ABNORMAL HIGH (ref 3.5–5.1)
Sodium: 137 mmol/L (ref 135–145)
Total Bilirubin: 1.9 mg/dL — ABNORMAL HIGH (ref 0.3–1.2)
Total Protein: 5.5 g/dL — ABNORMAL LOW (ref 6.5–8.1)

## 2022-04-27 LAB — I-STAT CHEM 8, ED
BUN: 23 mg/dL (ref 8–23)
Calcium, Ion: 0.88 mmol/L — CL (ref 1.15–1.40)
Chloride: 97 mmol/L — ABNORMAL LOW (ref 98–111)
Creatinine, Ser: 1.1 mg/dL (ref 0.61–1.24)
Glucose, Bld: 91 mg/dL (ref 70–99)
HCT: 47 % (ref 39.0–52.0)
Hemoglobin: 16 g/dL (ref 13.0–17.0)
Potassium: 5.5 mmol/L — ABNORMAL HIGH (ref 3.5–5.1)
Sodium: 134 mmol/L — ABNORMAL LOW (ref 135–145)
TCO2: 32 mmol/L (ref 22–32)

## 2022-04-27 LAB — URINALYSIS, ROUTINE W REFLEX MICROSCOPIC
Bacteria, UA: NONE SEEN
Bilirubin Urine: NEGATIVE
Glucose, UA: NEGATIVE mg/dL
Ketones, ur: 20 mg/dL — AB
Leukocytes,Ua: NEGATIVE
Nitrite: NEGATIVE
Protein, ur: 100 mg/dL — AB
RBC / HPF: >50 RBC/hpf — ABNORMAL HIGH (ref 0–5)
Specific Gravity, Urine: 1.045 — ABNORMAL HIGH (ref 1.005–1.030)
pH: 5 (ref 5.0–8.0)

## 2022-04-27 LAB — DIFFERENTIAL
Abs Immature Granulocytes: 0.02 10*3/uL (ref 0.00–0.07)
Basophils Absolute: 0 10*3/uL (ref 0.0–0.1)
Basophils Relative: 0 %
Eosinophils Absolute: 0 10*3/uL (ref 0.0–0.5)
Eosinophils Relative: 1 %
Immature Granulocytes: 0 %
Lymphocytes Relative: 12 %
Lymphs Abs: 0.7 10*3/uL (ref 0.7–4.0)
Monocytes Absolute: 0.3 10*3/uL (ref 0.1–1.0)
Monocytes Relative: 6 %
Neutro Abs: 4.3 10*3/uL (ref 1.7–7.7)
Neutrophils Relative %: 81 %

## 2022-04-27 LAB — PROTIME-INR
INR: 1.3 — ABNORMAL HIGH (ref 0.8–1.2)
Prothrombin Time: 15.8 seconds — ABNORMAL HIGH (ref 11.4–15.2)

## 2022-04-27 LAB — RESP PANEL BY RT-PCR (FLU A&B, COVID) ARPGX2
Influenza A by PCR: NEGATIVE
Influenza B by PCR: NEGATIVE
SARS Coronavirus 2 by RT PCR: NEGATIVE

## 2022-04-27 LAB — AMMONIA: Ammonia: 16 umol/L (ref 9–35)

## 2022-04-27 LAB — APTT: aPTT: 38 seconds — ABNORMAL HIGH (ref 24–36)

## 2022-04-27 LAB — ETHANOL: Alcohol, Ethyl (B): <10 mg/dL (ref <10)

## 2022-04-27 MED ORDER — NICOTINE 21 MG/24HR TD PT24
21.0000 mg | MEDICATED_PATCH | Freq: Every day | TRANSDERMAL | Status: DC
Start: 2022-04-27 — End: 2022-05-03
  Administered 2022-04-27 – 2022-05-02 (×6): 21 mg via TRANSDERMAL
  Filled 2022-04-27 (×7): qty 1

## 2022-04-27 MED ORDER — ALBUTEROL SULFATE (2.5 MG/3ML) 0.083% IN NEBU: 2.5000 mg | INHALATION_SOLUTION | Freq: Four times a day (QID) | RESPIRATORY_TRACT | Status: DC | PRN

## 2022-04-27 MED ORDER — SODIUM ZIRCONIUM CYCLOSILICATE 10 G PO PACK
10.0000 g | PACK | Freq: Once | ORAL | Status: DC
Start: 2022-04-27 — End: 2022-04-29

## 2022-04-27 MED ORDER — ONDANSETRON HCL 4 MG PO TABS: 4.0000 mg | ORAL_TABLET | Freq: Four times a day (QID) | ORAL | Status: DC | PRN

## 2022-04-27 MED ORDER — PREDNISONE 20 MG PO TABS
40.0000 mg | ORAL_TABLET | Freq: Every day | ORAL | Status: AC
  Administered 2022-04-28 – 2022-05-01 (×4): 40 mg via ORAL
  Filled 2022-04-27 (×4): qty 2

## 2022-04-27 MED ORDER — HYDROCHLOROTHIAZIDE 12.5 MG PO TABS
12.5000 mg | ORAL_TABLET | Freq: Every day | ORAL | Status: DC
  Administered 2022-04-28 – 2022-05-02 (×5): 12.5 mg via ORAL
  Filled 2022-04-27 (×7): qty 1

## 2022-04-27 MED ORDER — ALBUTEROL SULFATE HFA 108 (90 BASE) MCG/ACT IN AERS
2.0000 | INHALATION_SPRAY | Freq: Four times a day (QID) | RESPIRATORY_TRACT | Status: DC | PRN
Start: 2022-04-27 — End: 2022-04-27

## 2022-04-27 MED ORDER — ASPIRIN 81 MG PO TBEC
81.0000 mg | DELAYED_RELEASE_TABLET | Freq: Every day | ORAL | Status: DC
  Administered 2022-04-28 – 2022-05-02 (×5): 81 mg via ORAL
  Filled 2022-04-27 (×6): qty 1

## 2022-04-27 MED ORDER — ONDANSETRON HCL 4 MG/2ML IJ SOLN: 4.0000 mg | Freq: Four times a day (QID) | INTRAMUSCULAR | Status: DC | PRN

## 2022-04-27 MED ORDER — METHYLPREDNISOLONE SODIUM SUCC 40 MG IJ SOLR
40.0000 mg | Freq: Two times a day (BID) | INTRAMUSCULAR | Status: AC
  Administered 2022-04-27 – 2022-04-28 (×2): 40 mg via INTRAVENOUS
  Filled 2022-04-27: qty 1

## 2022-04-27 MED ORDER — EZETIMIBE 10 MG PO TABS
10.0000 mg | ORAL_TABLET | Freq: Every day | ORAL | Status: DC
  Administered 2022-04-28 – 2022-05-02 (×5): 10 mg via ORAL
  Filled 2022-04-27 (×6): qty 1

## 2022-04-27 MED ORDER — IOHEXOL 350 MG/ML SOLN
100.0000 mL | Freq: Once | INTRAVENOUS | Status: AC | PRN
  Administered 2022-04-27: 100 mL via INTRAVENOUS

## 2022-04-27 MED ORDER — ALBUTEROL SULFATE (2.5 MG/3ML) 0.083% IN NEBU
2.5000 mg | INHALATION_SOLUTION | Freq: Four times a day (QID) | RESPIRATORY_TRACT | Status: DC
Start: 2022-04-27 — End: 2022-04-28
  Administered 2022-04-27 – 2022-04-28 (×4): 2.5 mg via RESPIRATORY_TRACT
  Filled 2022-04-27 (×4): qty 3

## 2022-04-27 MED ORDER — MOMETASONE FURO-FORMOTEROL FUM 200-5 MCG/ACT IN AERO
2.0000 | INHALATION_SPRAY | Freq: Two times a day (BID) | RESPIRATORY_TRACT | Status: DC
  Administered 2022-04-28 – 2022-05-03 (×11): 2 via RESPIRATORY_TRACT
  Filled 2022-04-27: qty 8.8

## 2022-04-27 MED ORDER — ENOXAPARIN SODIUM 40 MG/0.4ML IJ SOSY
40.0000 mg | PREFILLED_SYRINGE | INTRAMUSCULAR | Status: AC
  Administered 2022-04-27 – 2022-04-30 (×4): 40 mg via SUBCUTANEOUS
  Filled 2022-04-27 (×4): qty 0.4

## 2022-04-27 MED ORDER — SODIUM CHLORIDE 0.9 % IV BOLUS
1000.0000 mL | Freq: Once | INTRAVENOUS | Status: AC
  Administered 2022-04-27: 1000 mL via INTRAVENOUS

## 2022-04-27 MED ORDER — ROSUVASTATIN CALCIUM 20 MG PO TABS
40.0000 mg | ORAL_TABLET | Freq: Every day | ORAL | Status: DC
  Administered 2022-04-28 – 2022-05-02 (×5): 40 mg via ORAL
  Filled 2022-04-27 (×6): qty 2

## 2022-04-27 MED ORDER — SODIUM CHLORIDE 0.9 % IV SOLN: INTRAVENOUS | Status: AC

## 2022-04-27 MED ORDER — BISACODYL 5 MG PO TBEC: 5.0000 mg | DELAYED_RELEASE_TABLET | Freq: Every day | ORAL | Status: DC | PRN

## 2022-04-27 NOTE — ED Notes (Signed)
Pt transported to MRI 

## 2022-04-27 NOTE — ED Notes (Signed)
Patient denies pain and is resting comfortably.  

## 2022-04-27 NOTE — ED Notes (Signed)
Dr,hong shown results of Chem8 lab. ED-Lab.

## 2022-04-27 NOTE — ED Notes (Signed)
Admitting provider at bedside.

## 2022-04-27 NOTE — H&P (Signed)
History and Physical    [name] VQQ:[id number] DOB: 1941-01-21 DOA: 04/27/2022  PCP: Etta Grandchild, MD (Confirm with patient/family/NH records and if not entered, this has to be entered at [facility] point of entry) Patient coming from: Home  I have personally briefly reviewed patient's old medical records in [facility]  Chief Complaint: I fell  HPI: [name] is a 81 y.o. male with medical history significant of COPD Gold stage III, chronic hypoxic failure, noncompliant with home O2 and COPD medications, HTN, HLD, CAD status post stenting, cigarette smoke, presented with fall and altered mentations.  Patient was found on the garage floor this morning by family member, awake and confused.  At baseline, patient has poorly controlled COPD with ongoing smoking, and chronic ambulation dysfunction but refused to use cane/walker.  He has had multiple falls this year.  No history of head injury.  He is not on blood thinner.  Family also reported patient appeared to feel depressed since October 2022 after his wife was sent to nursing home.  Since then, patient has picked up cigarette smoke again, and his appetite has decreased significantly and had a significant weight loss since last year.  Meantime, patient also refused to use home O2 and nebulizer and maintenance Advair.  Patient himself remembered that he tripped in the garage and fell down.  He does admitted episode of lightheadedness when standing up or getting up too quickly and fell before.  But he does not remember whether that was what happened this time.  He denies any numbness weakness of any of the limbs.    ED Course: Once, no hypotension no tachycardia afebrile, no significant hypoxia.  CT head negative for acute findings, CT angiogram showed right ICA occlusion with reconstitution at the distal right PCA.  Neurology consulted code stroke called, and patient is being shifted to MRI.  Blood work showed K5.5, bicarb 27, total  protein 5.5, albumin 2.2  Review of Systems: Able to perform, patient confused.  Past Medical History:  Diagnosis Date   Asthma    as a child   Carotid arterial disease (HCC)    a. 11/2015 Carotid U/S: LICA 40-59%, RICA 100 CTO, nl subclavian arteries--f/u 1 yr.   Coronary atherosclerosis of native coronary artery    a. 09/2001 inflat MI/PCI: RCA 19m (3.0x18 AVE S7 BMS); b. 07/2006 Cath: LM nl, LAD 50p, LCX min irregs, OM1 large, min irregs, OM2 min irregs, RCA 20 ISR;  c. 10/2012 MV: EF 74%, no ischemia.   Essential hypertension    Heart murmur    since a child   Hyperlipidemia    Myocardial infarction [facility]) 2002   Pneumonia    age 72   Right inguinal hernia     Past Surgical History:  Procedure Laterality Date   CARDIAC CATHETERIZATION     Carotid stents     COLONOSCOPY     FRACTURE SURGERY     HERNIA REPAIR     ventral   INGUINAL HERNIA REPAIR Right 11/01/2016   Procedure: RIGHT INGUINAL HERNIA REPAIR WITH MESH;  Surgeon: Avel Peace, MD;  Location: [facility] OR;  Service: General;  Laterality: Right;   INSERTION OF MESH Right 11/01/2016   Procedure: INSERTION OF MESH;  Surgeon: Avel Peace, MD;  Location: [facility] OR;  Service: General;  Laterality: Right;   ORIF SHOULDER FRACTURE Right      reports that he quit smoking about 23 years ago. His smoking use included cigarettes. He has a  44.00 pack-year smoking history. He has quit using smokeless tobacco.  His smokeless tobacco use included chew. He reports that he does not drink alcohol and does not use drugs.  Allergies  Allergen Reactions   Penicillins Hives and Swelling    Has patient had a PCN reaction causing immediate rash, facial/tongue/throat swelling, SOB or lightheadedness with hypotension: Yes Has patient had a PCN reaction causing severe rash involving mucus membranes or skin necrosis: Yes Has patient had a PCN reaction that required hospitalization No Has patient had a PCN reaction occurring within the last 10 years:  No If all of the above answers are "NO", then may proceed with Cephalosporin use.     Family History  Problem Relation Age of Onset   Hypertension Other      Prior to Admission medications   Medication Sig Start Date End Date Taking? Authorizing Provider  albuterol (PROVENTIL) (2.5 MG/3ML) 0.083% nebulizer solution INHALE THE CONTENTS OF 1 VIAL INTO THE LUNGS VIA NEBULIZER EVERY 6 HOURS AS NEEDED FOR WHEEZING OR SHORTNESS OF BREATH Patient taking differently: Take 2.5 mg by nebulization every 6 (six) hours as needed for wheezing or shortness of breath. INHALE THE CONTENTS OF 1 VIAL INTO THE LUNGS VIA NEBULIZER EVERY 6 HOURS AS NEEDED FOR WHEEZING OR SHORTNESS OF BREATH 02/11/22   Janith Lima, MD  albuterol (VENTOLIN HFA) 108 (90 Base) MCG/ACT inhaler Inhale 2 puffs into the lungs every 6 (six) hours as needed for wheezing or shortness of breath. 10/04/21   Michela Pitcher, NP  aspirin EC 81 MG EC tablet Take 1 tablet (81 mg total) by mouth daily. 01/25/17   Regalado, Belkys A, MD  ezetimibe (ZETIA) 10 MG tablet TAKE 1 TABLET(10 MG) BY MOUTH DAILY. Schedule an appointment with cardiology for future refills, 2nd attempt Patient taking differently: Take 10 mg by mouth daily. TAKE 1 TABLET(10 MG) BY MOUTH DAILY. Schedule an appointment with cardiology for future refills, 2nd attempt 12/06/21   Lelon Perla, MD  hydrochlorothiazide (MICROZIDE) 12.5 MG capsule TAKE 1 CAPSULE BY MOUTH DAILY Patient taking differently: Take 12.5 mg by mouth daily. 05/16/21   Lelon Perla, MD  rosuvastatin (CRESTOR) 40 MG tablet PATIENT MUST SCHEDULE APPOINTMENT FOR FUTURE REFILLS. LAST ATTEMPT. Patient taking differently: Take 40 mg by mouth daily. PATIENT MUST SCHEDULE APPOINTMENT FOR FUTURE REFILLS. LAST ATTEMPT. 01/14/22   Lelon Perla, MD    Physical Exam: Vitals:   04/27/22 1430 04/27/22 1617 04/27/22 1630 04/27/22 1645  BP: (!) 117/56 (!) 118/53 125/77 (!) 115/57  Pulse: 68 73 (!) 58 63  Resp:  18 17 15 15   Temp:      TempSrc:      SpO2: 97% 99% 99% 95%  Weight:        Constitutional: NAD, calm, comfortable Vitals:   04/27/22 1430 04/27/22 1617 04/27/22 1630 04/27/22 1645  BP: (!) 117/56 (!) 118/53 125/77 (!) 115/57  Pulse: 68 73 (!) 58 63  Resp: 18 17 15 15   Temp:      TempSrc:      SpO2: 97% 99% 99% 95%  Weight:       Eyes: PERRL, lids and conjunctivae normal ENMT: Mucous membranes are dry. Posterior pharynx clear of any exudate or lesions.Normal dentition.  Neck: normal, supple, no masses, no thyromegaly Respiratory: Diminished breathing sound bilaterally, scattered wheezing no crackles, increasing breathing effort, talking in broken sentences. No accessory muscle use.  Cardiovascular: Regular rate and rhythm, no murmurs / rubs / gallops.  No extremity edema. 2+ pedal pulses. No carotid bruits.  Abdomen: no tenderness, no masses palpated. No hepatosplenomegaly. Bowel sounds positive.  Musculoskeletal: no clubbing / cyanosis. No joint deformity upper and lower extremities. Good ROM, no contractures. Normal muscle tone.  Skin: no rashes, lesions, ulcers. No induration Neurologic: No facial droops, moving all limbs, following second, Psychiatric: Oriented to himself, confused about time and place   Labs on Admission: I have personally reviewed following labs and imaging studies  CBC: Recent Labs  Lab 04/27/22 1138 04/27/22 1143  WBC  --  5.3  NEUTROABS  --  4.3  HGB 16.0 13.9  HCT 47.0 41.9  MCV  --  94.2  PLT  --  [id number]   Basic Metabolic Panel: Recent Labs  Lab 04/27/22 1138 04/27/22 1143  NA 134* 137  K 5.5* 5.5*  CL 97* 96*  CO2  --  27  GLUCOSE 91 96  BUN 23 15  CREATININE 1.10 1.17  CALCIUM  --  8.4*   GFR: Estimated Creatinine Clearance: 39.7 mL/min (by C-G formula based on SCr of 1.17 mg/dL). Liver Function Tests: Recent Labs  Lab 04/27/22 1143  AST 41  ALT 27  ALKPHOS 84  BILITOT 1.9*  PROT 5.5*  ALBUMIN 2.2*   No results for  input(s): "LIPASE", "AMYLASE" in the last 168 hours. No results for input(s): "AMMONIA" in the last 168 hours. Coagulation Profile: Recent Labs  Lab 04/27/22 1143  INR 1.3*   Cardiac Enzymes: No results for input(s): "CKTOTAL", "CKMB", "CKMBINDEX", "TROPONINI" in the last 168 hours. BNP (last 3 results) Recent Labs    10/04/21 0916  PROBNP 27.0   HbA1C: No results for input(s): "HGBA1C" in the last 72 hours. CBG: No results for input(s): "GLUCAP" in the last 168 hours. Lipid Profile: No results for input(s): "CHOL", "HDL", "LDLCALC", "TRIG", "CHOLHDL", "LDLDIRECT" in the last 72 hours. Thyroid Function Tests: No results for input(s): "TSH", "T4TOTAL", "FREET4", "T3FREE", "THYROIDAB" in the last 72 hours. Anemia Panel: No results for input(s): "VITAMINB12", "FOLATE", "FERRITIN", "TIBC", "IRON", "RETICCTPCT" in the last 72 hours. Urine analysis:    Component Value Date/Time   COLORURINE AMBER (A) 04/27/2022 1630   APPEARANCEUR HAZY (A) 04/27/2022 1630   LABSPEC 1.045 (H) 04/27/2022 1630   PHURINE 5.0 04/27/2022 1630   GLUCOSEU NEGATIVE 04/27/2022 1630   GLUCOSEU NEGATIVE 08/16/2021 1439   HGBUR LARGE (A) 04/27/2022 1630   BILIRUBINUR NEGATIVE 04/27/2022 1630   KETONESUR 20 (A) 04/27/2022 1630   PROTEINUR 100 (A) 04/27/2022 1630   UROBILINOGEN 1.0 08/16/2021 1439   NITRITE NEGATIVE 04/27/2022 1630   LEUKOCYTESUR NEGATIVE 04/27/2022 1630    Radiological Exams on Admission: CT ANGIO HEAD NECK W WO CM W PERF (CODE STROKE)  Result Date: 04/27/2022 CLINICAL DATA:  Code stroke. Question left parietal infarction. EXAM: CT ANGIOGRAPHY HEAD AND NECK CT PERFUSION BRAIN TECHNIQUE: Multidetector CT imaging of the head and neck was performed using the standard protocol during bolus administration of intravenous contrast. Multiplanar CT image reconstructions and MIPs were obtained to evaluate the vascular anatomy. Carotid stenosis measurements (when applicable) are obtained utilizing  NASCET criteria, using the distal internal carotid diameter as the denominator. Multiphase CT imaging of the brain was performed following IV bolus contrast injection. Subsequent parametric perfusion maps were calculated using RAPID software. RADIATION DOSE REDUCTION: This exam was performed according to the departmental dose-optimization program which includes automated exposure control, adjustment of the mA and/or kV according to patient size and/or use of iterative reconstruction technique. CONTRAST:  OMNIPAQUE IOHEXOL 350 MG/ML SOLN COMPARISON:  CT head without contrast 04/27/2022. FINDINGS: CTA NECK FINDINGS Aortic arch: Atherosclerotic calcifications are present at the aortic arch and great vessel origins without focal aneurysm or stenosis. Right carotid system: Atherosclerotic calcifications are present in the distal right common carotid artery. The right internal carotid artery is occluded. No reconstitution in the neck. Left carotid system: The left common carotid artery demonstrates some distal calcification. Dense calcifications are present at the bifurcation. The lumen is narrowed to 2.3 mm, less than 50% stenosis relative to the more normal distal left ICA. Vertebral arteries: The vertebral arteries are codominant. Both vertebral arteries originate from the subclavian arteries without significant stenosis. Moderate narrowing of up to 50% is present in the right vertebral artery as it enters the spinal canal at C6. No other significant focal stenosis is present in either vertebral artery in the neck. Skeleton: Degenerative changes are present in the cervical spine with grade 1 anterolisthesis at C3-4 and C4-5. Marked endplate changes are present at C5-6 and C6-7. No focal osseous lesions are present. The patient is edentulous. Other neck: Diffuse subcutaneous edema is present in the neck. No discrete lesion or abscess is present. No significant adenopathy is present. The submandibular and parotid  glands and ducts are within normal limits. No focal mucosal lesions are present. The thyroid is normal. Upper chest: Centrilobular emphysematous changes are present. Scarring is present at both lung apices. Thoracic inlet is within normal limits. Review of the MIP images confirms the above findings CTA HEAD FINDINGS Anterior circulation: The right internal carotid artery is reconstituted at the level of the posterior communicating artery. Atherosclerotic calcifications are present within the cavernous left ICA without a significant stenosis relative to the ICA terminus. The left A1 segment is dominant. The anterior communicating artery is patent. The MCA scratched at the M1 segments are normal bilaterally. MCA bifurcations are within normal limits. The ACA and MCA branch vessels are within normal limits. Posterior circulation: Moderate stenosis is present at the dural margin of the right vertebral artery. The intracranial left vertebral artery is the dominant vessel. PICA origins are visualized and normal. Vertebrobasilar junction is normal. Basilar artery is normal. Both posterior cerebral arteries originate from basilar tip. Moderate stenosis is present in the proximal left posterior cerebral artery. Pial collateral vessels contribute. Venous sinuses: The dural sinuses are patent. The straight sinus deep cerebral veins are intact. Cortical veins are within normal limits. No significant vascular malformation is evident. Anatomic variants: Prominent right posterior communicating artery reconstituting the right ICA. Review of the MIP images confirms the above findings CT Brain Perfusion Findings: ASPECTS: 10/10 CBF (<30%) Volume: 56mL Perfusion (Tmax>6.0s) volume: 23mL Mismatch Volume: 57mL Infarction Location:N/A IMPRESSION: 1. CT perfusion demonstrates no acute infarct or significant area of ischemia. 2. The right internal carotid artery is occluded at the bifurcation with reconstitution at the level of the right  posterior communicating artery. 3. Less than 50% stenosis of the left internal carotid artery at the bifurcation. 4. Moderate stenosis of up to 70% in the right vertebral artery as it enters the spinal canal at C6. 5. Moderate stenosis of the proximal left posterior cerebral artery with pial collaterals filling the territory. 6. Aortic Atherosclerosis (ICD10-I70.0) and Emphysema (ICD10-J43.9). Electronically Signed   By: Marin Roberts M.D.   On: 04/27/2022 12:36   CT HEAD CODE STROKE WO CONTRAST`  Result Date: 04/27/2022 CLINICAL DATA:  Code stroke.  New onset of leg weakness. Fell. EXAM: CT HEAD WITHOUT  CONTRAST TECHNIQUE: Contiguous axial images were obtained from the base of the skull through the vertex without intravenous contrast. RADIATION DOSE REDUCTION: This exam was performed according to the departmental dose-optimization program which includes automated exposure control, adjustment of the mA and/or kV according to patient size and/or use of iterative reconstruction technique. COMPARISON:  None Available. FINDINGS: Brain: No acute infarct, hemorrhage, or mass lesion is present. Moderate atrophy and white matter changes are present bilaterally. Basal ganglia are intact. Insular ribbon is normal. No acute or focal cortical abnormality is present. The ventricles are proportionate to the degree of atrophy. No significant extraaxial fluid collection is present. The brainstem and cerebellum are within normal limits. Vascular: Atherosclerotic calcifications are present within the cavernous internal carotid arteries. Hyperdense vessel is present. Skull: Calvarium is intact. No focal lytic or blastic lesions are present. No significant extracranial soft tissue lesion is present. Sinuses/Orbits: Shrunken maxillary sinuses are present bilaterally. No acute sinus disease is present. The globes and orbits are within normal limits. ASPECTS [facility] Stroke Program Early CT Score) - Ganglionic level infarction  (caudate, lentiform nuclei, internal capsule, insula, M1-M3 cortex): 7/7 - Supraganglionic infarction (M4-M6 cortex): 3/3 Total score (0-10 with 10 being normal): 10/10 IMPRESSION: 1. No acute intracranial abnormality. 2. Moderate atrophy and white matter disease likely reflects the sequela of chronic microvascular ischemia. 3. ASPECTS is 10/10. * Electronically Signed   By: San Morelle M.D.   On: 04/27/2022 12:03    EKG: Independently reviewed.  Sinus rhythm, with clearly P wave before each QRS, no acute ST changes.  Assessment/Plan Principal Problem:   Encephalopathy Active Problems:   Acute encephalopathy  (please populate well all problems here in Problem List. (For example, if patient is on BP meds at home and you resume or decide to hold them, it is a problem that needs to be her. Same for CAD, COPD, HLD and so on)  Acute metabolic encephalopathy -Suspect secondary to acute COPD exacerbation, acute hypoxia and or acute hypercapnic respiratory failure. -O2 saturation satisfactory, will check VBG to rule out CO2 retention -Other DDx, MRI to rule out stroke as per neurologist recommendation.  Urine appears to clean no significant UTI.  Chest x-ray showed no clear infiltrates. -We will also check TSH, ammonium level, and B12 -N.p.o. for now, speech evaluation tomorrow  Acute COPD exacerbation -IV Solu-Medrol and p.o. steroid -Around-the-clock DuoNebs and as needed albuterol -Incentive spirometry  Severe protein calorie malnutrition -BMI= 18.7, with about 7 kg weight loss in last 6 months -We will have nutrition evaluation -Start ensure  Hypoalbuminemia -Probably secondary to poor nutrition status, check prealbumin  Severe dehydration -Poor skin turgor, and dry mucous membranes, from poor oral intake.  Continue maintenance IV fluid reevaluate in the morning.  Hyperkalemia -No ST changes done EKG, 1 dose of Lokelma -Kidney function within normal limits, check CK level.   Hydration as above  Depression -Likely related to patient's overall deterioration conditions and picking up smoking and noncompliant with home medications and home O2. -Consult psychiatry  HTN -Stable continue home meds.  Cigarette smoker -Postpone cessation consultation given his mentation changes -Nicotine patch  Deconditioning and fall -PT evaluation  DVT prophylaxis: Lovenox Code Status: DNR Family Communication: Daughter and son at bedside Disposition Plan: Patient sick with altered mentations with unclear etiology, also has COPD exacerbation requiring IV medications, expect more than 2 midnight hospital stay Consults called: None Admission status: Telemetry admission   Lequita Halt MD [facility] Pager [phone]  04/27/2022, 5:29 PM

## 2022-04-27 NOTE — ED Provider Notes (Signed)
[name]ERG[facility]Y DE[facility]MENT Provider Note   CSN: [id number] Arrival date & time: 04/27/22  1139  An emergency department physician performed an initial assessment on this suspected stroke patient at 1121.  History  Chief Complaint  Patient presents with   Code Stroke    [name] is a 81 y.o. male.  AroundPatient presents to the ER as a stroke alert activation.  Family is last saw him last night on 6 PM.  They state that he was able to be his normal self and get himself into bed.  However they checked on him this morning and found him on the ground unable to get up.  He seemed weak and confused.  Brought to the ER for evaluation.  Unable to give additional history per patient himself due to his new confusion and weakness.       Home Medications Prior to Admission medications   Medication Sig Start Date End Date Taking? Authorizing Provider  albuterol (PROVENTIL) (2.5 MG/3ML) 0.083% nebulizer solution INHALE THE CONTENTS OF 1 VIAL INTO THE LUNGS VIA NEBULIZER EVERY 6 HOURS AS NEEDED FOR WHEEZING OR SHORTNESS OF BREATH 02/11/22   Janith Lima, MD  albuterol (VENTOLIN HFA) 108 (90 Base) MCG/ACT inhaler Inhale 2 puffs into the lungs every 6 (six) hours as needed for wheezing or shortness of breath. 10/04/21   Michela Pitcher, NP  aspirin EC 81 MG EC tablet Take 1 tablet (81 mg total) by mouth daily. 01/25/17   Regalado, Belkys A, MD  ezetimibe (ZETIA) 10 MG tablet TAKE 1 TABLET(10 MG) BY MOUTH DAILY. Schedule an appointment with cardiology for future refills, 2nd attempt 12/06/21   Lelon Perla, MD  hydrochlorothiazide (MICROZIDE) 12.5 MG capsule TAKE 1 CAPSULE BY MOUTH DAILY 05/16/21   Lelon Perla, MD  rosuvastatin (CRESTOR) 40 MG tablet PATIENT MUST SCHEDULE APPOINTMENT FOR FUTURE REFILLS. LAST ATTEMPT. 01/14/22   Lelon Perla, MD      Allergies    Penicillins    Review of Systems   Review of Systems  Unable to perform ROS: Mental status  change    Physical Exam Updated Vital Signs BP (!) 122/59   Pulse 60   Temp 98.6 F (37 C) (Oral)   Resp 16   Wt 57.6 kg   SpO2 97%   BMI 18.75 kg/m  Physical Exam Constitutional:      Appearance: He is well-developed.  HENT:     Head: Normocephalic.     Nose: Nose normal.  Eyes:     Extraocular Movements: Extraocular movements intact.  Cardiovascular:     Rate and Rhythm: Normal rate.  Pulmonary:     Effort: Pulmonary effort is normal.  Musculoskeletal:     Comments: Appears to be moving all extremities, no facial droop noted.  Questionable weakness in the right upper and right lower extremity compared to left.  No gaze deviance.  Extraocular motion otherwise intact.  Skin:    Coloration: Skin is not jaundiced.  Neurological:     Mental Status: He is alert.     Comments: Questionable weakness in the right upper right lower extremity.  No facial droop no slurred speech extraocular motion intact.  Patient following commands, but appears somewhat confused.     ED Results / Procedures / Treatments   Labs (all labs ordered are listed, but only abnormal results are displayed) Labs Reviewed  PROTIME-INR - Abnormal; Notable for the following components:      Result Value  Prothrombin Time 15.8 (*)    INR 1.3 (*)    All other components within normal limits  APTT - Abnormal; Notable for the following components:   aPTT 38 (*)    All other components within normal limits  CBC - Abnormal; Notable for the following components:   RDW 16.2 (*)    All other components within normal limits  COMPREHENSIVE METABOLIC PANEL - Abnormal; Notable for the following components:   Potassium 5.5 (*)    Chloride 96 (*)    Calcium 8.4 (*)    Total Protein 5.5 (*)    Albumin 2.2 (*)    Total Bilirubin 1.9 (*)    All other components within normal limits  I-STAT CHEM 8, ED - Abnormal; Notable for the following components:   Sodium 134 (*)    Potassium 5.5 (*)    Chloride 97 (*)     Calcium, Ion 0.88 (*)    All other components within normal limits  RESP PANEL BY RT-PCR (FLU A&B, COVID) ARPGX2  ETHANOL  DIFFERENTIAL  RAPID URINE DRUG SCREEN, HOSP PERFORMED  URINALYSIS, ROUTINE W REFLEX MICROSCOPIC    EKG None  Radiology CT ANGIO HEAD NECK W WO CM W PERF (CODE STROKE)  Result Date: 04/27/2022 CLINICAL DATA:  Code stroke. Question left parietal infarction. EXAM: CT ANGIOGRAPHY HEAD AND NECK CT PERFUSION BRAIN TECHNIQUE: Multidetector CT imaging of the head and neck was performed using the standard protocol during bolus administration of intravenous contrast. Multiplanar CT image reconstructions and MIPs were obtained to evaluate the vascular anatomy. Carotid stenosis measurements (when applicable) are obtained utilizing NASCET criteria, using the distal internal carotid diameter as the denominator. Multiphase CT imaging of the brain was performed following IV bolus contrast injection. Subsequent parametric perfusion maps were calculated using RAPID software. RADIATION DOSE REDUCTION: This exam was performed according to the departmental dose-optimization program which includes automated exposure control, adjustment of the mA and/or kV according to patient size and/or use of iterative reconstruction technique. CONTRAST:  OMNIPAQUE IOHEXOL 350 MG/ML SOLN COMPARISON:  CT head without contrast 04/27/2022. FINDINGS: CTA NECK FINDINGS Aortic arch: Atherosclerotic calcifications are present at the aortic arch and great vessel origins without focal aneurysm or stenosis. Right carotid system: Atherosclerotic calcifications are present in the distal right common carotid artery. The right internal carotid artery is occluded. No reconstitution in the neck. Left carotid system: The left common carotid artery demonstrates some distal calcification. Dense calcifications are present at the bifurcation. The lumen is narrowed to 2.3 mm, less than 50% stenosis relative to the more normal distal  left ICA. Vertebral arteries: The vertebral arteries are codominant. Both vertebral arteries originate from the subclavian arteries without significant stenosis. Moderate narrowing of up to 50% is present in the right vertebral artery as it enters the spinal canal at C6. No other significant focal stenosis is present in either vertebral artery in the neck. Skeleton: Degenerative changes are present in the cervical spine with grade 1 anterolisthesis at C3-4 and C4-5. Marked endplate changes are present at C5-6 and C6-7. No focal osseous lesions are present. The patient is edentulous. Other neck: Diffuse subcutaneous edema is present in the neck. No discrete lesion or abscess is present. No significant adenopathy is present. The submandibular and parotid glands and ducts are within normal limits. No focal mucosal lesions are present. The thyroid is normal. Upper chest: Centrilobular emphysematous changes are present. Scarring is present at both lung apices. Thoracic inlet is within normal limits. Review of  the MIP images confirms the above findings CTA HEAD FINDINGS Anterior circulation: The right internal carotid artery is reconstituted at the level of the posterior communicating artery. Atherosclerotic calcifications are present within the cavernous left ICA without a significant stenosis relative to the ICA terminus. The left A1 segment is dominant. The anterior communicating artery is patent. The MCA scratched at the M1 segments are normal bilaterally. MCA bifurcations are within normal limits. The ACA and MCA branch vessels are within normal limits. Posterior circulation: Moderate stenosis is present at the dural margin of the right vertebral artery. The intracranial left vertebral artery is the dominant vessel. PICA origins are visualized and normal. Vertebrobasilar junction is normal. Basilar artery is normal. Both posterior cerebral arteries originate from basilar tip. Moderate stenosis is present in the  proximal left posterior cerebral artery. Pial collateral vessels contribute. Venous sinuses: The dural sinuses are patent. The straight sinus deep cerebral veins are intact. Cortical veins are within normal limits. No significant vascular malformation is evident. Anatomic variants: Prominent right posterior communicating artery reconstituting the right ICA. Review of the MIP images confirms the above findings CT Brain Perfusion Findings: ASPECTS: 10/10 CBF (<30%) Volume: 64mL Perfusion (Tmax>6.0s) volume: 76mL Mismatch Volume: 20mL Infarction Location:N/A IMPRESSION: 1. CT perfusion demonstrates no acute infarct or significant area of ischemia. 2. 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 3. Less than 50% stenosis of the left internal carotid artery at the bifurcation. 4. Moderate stenosis of up to 70% in the right vertebral artery as it enters the spinal canal at C6. 5. Moderate stenosis of the proximal left posterior cerebral artery with pial collaterals filling the territory. 6. Aortic Atherosclerosis (ICD10-I70.0) and Emphysema (ICD10-J43.9). Electronically Signed   By: Marin Roberts M.D.   On: 04/27/2022 12:36   CT HEAD CODE STROKE WO CONTRAST`  Result Date: 04/27/2022 CLINICAL DATA:  Code stroke.  New onset of leg weakness. Fell. EXAM: CT HEAD WITHOUT CONTRAST TECHNIQUE: Contiguous axial images were obtained from the base of the skull through the vertex without intravenous contrast. RADIATION DOSE REDUCTION: This exam was performed according to the departmental dose-optimization program which includes automated exposure control, adjustment of the mA and/or kV according to patient size and/or use of iterative reconstruction technique. COMPARISON:  None Available. FINDINGS: Brain: No acute infarct, hemorrhage, or mass lesion is present. Moderate atrophy and white matter changes are present bilaterally.  Basal ganglia are intact. Insular ribbon is normal. No acute or focal cortical abnormality is present. The ventricles are proportionate to the degree of atrophy. No significant extraaxial fluid collection is present. The brainstem and cerebellum are within normal limits. Vascular: Atherosclerotic calcifications are present within the cavernous internal carotid arteries. Hyperdense vessel is present. Skull: Calvarium is intact. No focal lytic or blastic lesions are present. No significant extracranial soft tissue lesion is present. Sinuses/Orbits: Shrunken maxillary sinuses are present bilaterally. No acute sinus disease is present. The globes and orbits are within normal limits. ASPECTS [facility] Stroke Program Early CT Score) - Ganglionic level infarction (caudate, lentiform nuclei, internal capsule, insula, M1-M3 cortex): 7/7 - Supraganglionic infarction (M4-M6 cortex): 3/3 Total score (0-10 with 10 being normal): 10/10 IMPRESSION: 1. No acute intracranial abnormality. 2. Moderate atrophy and white matter disease likely reflects the sequela of chronic microvascular ischemia. 3. ASPECTS is 10/10. * Electronically Signed   By: Marin Roberts M.D.   On: 04/27/2022 12:03    Procedures Procedures  Medications Ordered in ED Medications  iohexol (OMNIPAQUE) 350 MG/ML injection 100 mL (100 mLs Intravenous Contrast Given 04/27/22 1207)    ED Course/ Medical Decision Making/ A&P                           Medical Decision Making Amount and/or Complexity of Data Reviewed Labs: ordered.   Review of record shows study of his carotids December 13, 2021 showing carotid stenosis.  Cardiac monitoring showing sinus rhythm.  Patient evaluate by the stroke team.  CT head and CT perfusion are unremarkable.  Awaiting recommendation from neurology.  Will be signed out to oncoming provider.        Final Clinical Impression(s) / ED Diagnoses Final diagnoses:  Stroke-like episode    Rx / DC  Orders ED Discharge Orders     None         Cheryll Cockayne, MD 04/27/22 1500

## 2022-04-27 NOTE — Code Documentation (Signed)
Stroke Response Nurse Documentation Code Documentation  [name] is a 81 y.o. male arriving to [facility]  via [facility] on 04/27/2022 with past medical hx of CAD, COPD, HLD, heart murmur, current smoker, and recent weight loss. On aspirin 81 mg daily. Code stroke was activated by EMS.   Patient from home where he was LKW at yesterday 04/26/2022 at 1800 and now complaining of right sided weakness and right facial droop. His family brought him breakfast yesterday morning and he was in his normal state of health. He usually leaves his house around 1700/1800 to visit his wife who is in a nursing home. He states that yesterday he fell when he was leaving the house to visit his wife.  This morning when they returned, they found him face down outside on the concrete. He was noted to have feces and urine on him.    Stroke team at the bedside on patient arrival. Labs drawn and patient cleared for CT by Dr. Audley Hose. Patient to CT with team. NIHSS 6, see documentation for details and code stroke times. Patient with disoriented, right arm weakness, right leg weakness, right limb ataxia, Global aphasia , and dysarthria  on exam. The following imaging was completed:  CT Head, CTA, and CTP. Patient is not a candidate for IV Thrombolytic due to LKW >4.5 hours. Patient is not a candidate for IR due to negative for LVO.   Care Plan:  -Hospitalist admit -Q2H NIHSS and VS -Stroke workup  Bedside handoff with ED RN Alexa  Mitzi Davenport L Valissa Lyvers  Rapid Response RN

## 2022-04-27 NOTE — ED Provider Notes (Signed)
81 yo male presenting as code stroke, found down with unilateral weakness.  Family reports patient confused off baseline level  Pending neurology recommendations, pending admit for AMS  *  Neurologist Dr Otelia Limes is recommending MRI brain and medical admission.  Patient does not demonstrate clear evidence of infection on UA.  I updated the patient and his family at bedside regarding admission for altered mental status and MRI, and they are in agreement.  I do not see evidence of sepsis at this time   Terald Sleeper, MD 04/27/22 [id number]

## 2022-04-27 NOTE — Consult Note (Signed)
NEURO HOSPITALIST CONSULT NOTE   Requestig physician: Dr. Audley Hose  Reason for Consult: Found face down with confusion  History obtained from:  EMS and Chart     HPI:                                                                                                                                          [name] is an 81 y.o. male with PMHx of carotic artery disease s/p stenting, CAD, HTN, heart murmur, HLD and current heavy smoking (3 ppd) who presented from home via EMS as a Code Stroke after he was found face down on the concrete outside of his home. The patient's family visits him 2 times a day, bringing him food, which has been required since his wife passed away last fall. He was normal last night and was able to get himself in bed. He woke up this morning having difficulty with his legs. He walked outside and fell facedown on the concrete. His family found him down and called EMS. LKN yesterday at 1800.   On arrival to the ED, the patient had redness to the face and a 2 inch laceration to the right upper abdomen. He denied any pain. EMS vitals were 120/88 BP, 84 HR, 111 CBG and 94% O2 sats.   Past Medical History:  Diagnosis Date   Asthma    as a child   Carotid arterial disease (HCC)    a. 11/2015 Carotid U/S: LICA 40-59%, RICA 100 CTO, nl subclavian arteries--f/u 1 yr.   Coronary atherosclerosis of native coronary artery    a. 09/2001 inflat MI/PCI: RCA 63m (3.0x18 AVE S7 BMS); b. 07/2006 Cath: LM nl, LAD 50p, LCX min irregs, OM1 large, min irregs, OM2 min irregs, RCA 20 ISR;  c. 10/2012 MV: EF 74%, no ischemia.   Essential hypertension    Heart murmur    since a child   Hyperlipidemia    Myocardial infarction [facility]) 2002   Pneumonia    age 71   Right inguinal hernia     Past Surgical History:  Procedure Laterality Date   CARDIAC CATHETERIZATION     Carotid stents     COLONOSCOPY     FRACTURE SURGERY     HERNIA REPAIR     ventral   INGUINAL HERNIA  REPAIR Right 11/01/2016   Procedure: RIGHT INGUINAL HERNIA REPAIR WITH MESH;  Surgeon: Avel Peace, MD;  Location: [facility] OR;  Service: General;  Laterality: Right;   INSERTION OF MESH Right 11/01/2016   Procedure: INSERTION OF MESH;  Surgeon: Avel Peace, MD;  Location: [facility] OR;  Service: General;  Laterality: Right;   ORIF SHOULDER FRACTURE Right     Family History  Problem Relation Age of Onset   Hypertension Other  Social History:  reports that he quit smoking about 23 years ago. His smoking use included cigarettes. He has a 44.00 pack-year smoking history. He has quit using smokeless tobacco.  His smokeless tobacco use included chew. He reports that he does not drink alcohol and does not use drugs.  Allergies  Allergen Reactions   Penicillins Hives and Swelling    Has patient had a PCN reaction causing immediate rash, facial/tongue/throat swelling, SOB or lightheadedness with hypotension: Yes Has patient had a PCN reaction causing severe rash involving mucus membranes or skin necrosis: Yes Has patient had a PCN reaction that required hospitalization No Has patient had a PCN reaction occurring within the last 10 years: No If all of the above answers are "NO", then may proceed with Cephalosporin use.     MEDICATIONS:                                                                                                                     Prior to Admission:  Medications Prior to Admission  Medication Sig Dispense Refill Last Dose   albuterol (PROVENTIL) (2.5 MG/3ML) 0.083% nebulizer solution INHALE THE CONTENTS OF 1 VIAL INTO THE LUNGS VIA NEBULIZER EVERY 6 HOURS AS NEEDED FOR WHEEZING OR SHORTNESS OF BREATH (Patient taking differently: Take 2.5 mg by nebulization every 6 (six) hours as needed for wheezing or shortness of breath. INHALE THE CONTENTS OF 1 VIAL INTO THE LUNGS VIA NEBULIZER EVERY 6 HOURS AS NEEDED FOR WHEEZING OR SHORTNESS OF BREATH) 75 mL 3    albuterol (VENTOLIN  HFA) 108 (90 Base) MCG/ACT inhaler Inhale 2 puffs into the lungs every 6 (six) hours as needed for wheezing or shortness of breath. 8 g 11    aspirin EC 81 MG EC tablet Take 1 tablet (81 mg total) by mouth daily. 30 tablet 0    ezetimibe (ZETIA) 10 MG tablet TAKE 1 TABLET(10 MG) BY MOUTH DAILY. Schedule an appointment with cardiology for future refills, 2nd attempt (Patient taking differently: Take 10 mg by mouth daily. TAKE 1 TABLET(10 MG) BY MOUTH DAILY. Schedule an appointment with cardiology for future refills, 2nd attempt) 15 tablet 0    hydrochlorothiazide (MICROZIDE) 12.5 MG capsule TAKE 1 CAPSULE BY MOUTH DAILY (Patient taking differently: Take 12.5 mg by mouth daily.) 90 capsule 0    rosuvastatin (CRESTOR) 40 MG tablet PATIENT MUST SCHEDULE APPOINTMENT FOR FUTURE REFILLS. LAST ATTEMPT. (Patient taking differently: Take 40 mg by mouth daily. PATIENT MUST SCHEDULE APPOINTMENT FOR FUTURE REFILLS. LAST ATTEMPT.) 15 tablet 0      ROS:  Unable to obtain a detailed ROS due to dysarthria and confusion.    Weight 57.6 kg.   General Examination:                                                                                                       Physical Exam  HEENT-  Normocephalic. Some facial redness and edema around the eyes.   Lungs- Respirations unlabored Extremities- Wrinkling to skin below knees is prominent in the context of some residual pitting edema.   Neurological Examination Mental Status: Lethargic with increased latencies of verbal responses and hypophonic, dysarhric speech. Poor orientation. Speech output is sparse with short phrases only and mildly dysphasic. In the context of being [facility], he follows all simple commands. Answers one LOC question incorrectly.  Cranial Nerves: II: Temporal visual fields intact with no extinction to DSS. PERRL.   III,IV, VI: EOMI. No nystagmus.  V,VII: Reacts to touch bilaterally. Smile symmetric VIII: HOH IX,X: Hypophonic speech XI: Symmetric  XII: Midline tongue extension Motor: LUE and LLE 5/5 RUE and RLE 4+/5 with drift of both limbs Sensory: Grossly intact to touch x 4. No extinction to DSS.  Deep Tendon Reflexes: Unremarkable with no asymmetry noted.  Plantars: Equivocal bilaterally  Cerebellar: Mild right sided limb ataxia Gait: Deferred  NIHSS: 6   Lab Results: Basic Metabolic Panel: No results for input(s): "NA", "K", "CL", "CO2", "GLUCOSE", "BUN", "CREATININE", "CALCIUM", "MG", "PHOS" in the last 168 hours.  CBC: No results for input(s): "WBC", "NEUTROABS", "HGB", "HCT", "MCV", "PLT" in the last 168 hours.  Cardiac Enzymes: No results for input(s): "CKTOTAL", "CKMB", "CKMBINDEX", "TROPONINI" in the last 168 hours.  Lipid Panel: No results for input(s): "CHOL", "TRIG", "HDL", "CHOLHDL", "VLDL", "LDLCALC" in the last 168 hours.  Imaging: No results found.   Assessment: 81 y.o. male with PMHx of carotic artery disease s/p stenting, CAD, HTN, heart murmur, HLD and current heavy smoking (3 ppd) who presented from home via EMS as a Code Stroke after he was found face down on the concrete outside of his home. The patient's family visits him 2 times a day, bringing him food, which has been required since his wife passed away last fall. He was normal last night and was able to get himself in bed. He woke up this morning having difficulty with his legs. He walked outside and fell facedown on the concrete. His family found him down and called EMS. LKN yesterday at 1800.  1. Exam reveals mild right sided motor deficits, mild ataxia and cognitive/communication deficits. NIHSS 6.  2. CT head: No acute intracranial abnormality. Moderate atrophy and white matter disease likely reflects the sequela of chronic microvascular ischemia. ASPECTS is 10/10. 3. CTA of head and neck: The right internal  carotid artery is occluded at the bifurcation with reconstitution at the level of the right posterior communicating artery; per telephone communication with Radiology, this appears most likely to be chronic. Less than 50% stenosis of the left internal carotid artery at the bifurcation. Moderate stenosis of up to 70% in the right vertebral artery as it enters  the spinal canal at C6. Moderate stenosis of the proximal left posterior cerebral artery with pial collaterals filling the territory. Aortic atherosclerosis.  4. CT perfusion demonstrates no acute infarct or significant area of ischemia. 5. DDx for presentation includes AMS secondary to poor hydration, syncope with LOC and concussion, stroke, TIA, seizure with postictal state and toxic/metabolic/infectious encephalopathy.    Recommendations: 1. Full stroke work up to include MRI brain, TTE and cardiac telemetry.  2. Toxic/metabolic/infectious work up.  3. IVF 4. Permissive HTN x 24 hours. Use modified parameters given advanced age: treat SBP if > 180.  5. HgbA1c, fasting lipid panel 6. PT consult, OT consult, Speech consult 7. Continue home ASA. Decision on whether or not to add Plavix per Stroke Team given that he presented with a fall which is consistent with a higher bleeding risk 8. Risk factor modification to include smoking cessation counseling. Patient did not seem receptive to this when MD attempted to discuss 9. Frequent neuro checks 10. NPO until passes stroke swallow screen    Electronically signed: Dr. Caryl Pina 04/27/2022, 11:41 AM

## 2022-04-27 NOTE — ED Triage Notes (Signed)
Pt bib [facility] from home for possible stroke. Pt family visits him 2 times a day. Pt was normal last night and was able to get himself in bed. Pt woke up this am having difficulty with his legs. Pt walked outside and fell facedown on the concrete. Pt family found him. Pt has redness to the face and a 2inch lac to the right upper abd. Pt denies any pain.   EMS vitals 120/88 BP 84 HR 111CBG 94 O2

## 2022-04-28 ENCOUNTER — Encounter (HOSPITAL_COMMUNITY): Payer: Self-pay | Admitting: Internal Medicine

## 2022-04-28 ENCOUNTER — Inpatient Hospital Stay (HOSPITAL_COMMUNITY): Payer: Medicare Other

## 2022-04-28 DIAGNOSIS — N179 Acute kidney failure, unspecified: Secondary | ICD-10-CM | POA: Insufficient documentation

## 2022-04-28 DIAGNOSIS — I10 Essential (primary) hypertension: Secondary | ICD-10-CM

## 2022-04-28 DIAGNOSIS — E43 Unspecified severe protein-calorie malnutrition: Secondary | ICD-10-CM

## 2022-04-28 DIAGNOSIS — W19XXXA Unspecified fall, initial encounter: Secondary | ICD-10-CM

## 2022-04-28 DIAGNOSIS — G9340 Encephalopathy, unspecified: Secondary | ICD-10-CM | POA: Diagnosis not present

## 2022-04-28 DIAGNOSIS — G9341 Metabolic encephalopathy: Secondary | ICD-10-CM

## 2022-04-28 DIAGNOSIS — Y92009 Unspecified place in unspecified non-institutional (private) residence as the place of occurrence of the external cause: Secondary | ICD-10-CM

## 2022-04-28 DIAGNOSIS — R2990 Unspecified symptoms and signs involving the nervous system: Secondary | ICD-10-CM

## 2022-04-28 DIAGNOSIS — R319 Hematuria, unspecified: Secondary | ICD-10-CM

## 2022-04-28 DIAGNOSIS — L602 Onychogryphosis: Secondary | ICD-10-CM

## 2022-04-28 DIAGNOSIS — E875 Hyperkalemia: Secondary | ICD-10-CM

## 2022-04-28 DIAGNOSIS — F32A Depression, unspecified: Secondary | ICD-10-CM

## 2022-04-28 DIAGNOSIS — Z720 Tobacco use: Secondary | ICD-10-CM

## 2022-04-28 LAB — BASIC METABOLIC PANEL
Anion gap: 10 (ref 5–15)
BUN: 5 mg/dL — ABNORMAL LOW (ref 8–23)
CO2: 19 mmol/L — ABNORMAL LOW (ref 22–32)
Calcium: 9.1 mg/dL (ref 8.9–10.3)
Chloride: 106 mmol/L (ref 98–111)
Creatinine, Ser: 0.65 mg/dL (ref 0.61–1.24)
GFR, Estimated: >60 mL/min (ref >60)
Glucose, Bld: 129 mg/dL — ABNORMAL HIGH (ref 70–99)
Potassium: 3.8 mmol/L (ref 3.5–5.1)
Sodium: 135 mmol/L (ref 135–145)

## 2022-04-28 LAB — BLOOD GAS, VENOUS
Acid-Base Excess: 5 mmol/L — ABNORMAL HIGH (ref 0.0–2.0)
Bicarbonate: 31.8 mmol/L — ABNORMAL HIGH (ref 20.0–28.0)
O2 Saturation: 54 %
Patient temperature: 37
pCO2, Ven: 55 mmHg (ref 44–60)
pH, Ven: 7.37 (ref 7.25–7.43)
pO2, Ven: 33 mmHg (ref 32–45)

## 2022-04-28 LAB — CBC
HCT: 35.6 % — ABNORMAL LOW (ref 39.0–52.0)
Hemoglobin: 11.9 g/dL — ABNORMAL LOW (ref 13.0–17.0)
MCH: 31.1 pg (ref 26.0–34.0)
MCHC: 33.4 g/dL (ref 30.0–36.0)
MCV: 93 fL (ref 80.0–100.0)
Platelets: 172 10*3/uL (ref 150–400)
RBC: 3.83 MIL/uL — ABNORMAL LOW (ref 4.22–5.81)
RDW: 16 % — ABNORMAL HIGH (ref 11.5–15.5)
WBC: 4.9 10*3/uL (ref 4.0–10.5)
nRBC: 0 % (ref 0.0–0.2)

## 2022-04-28 LAB — ECHOCARDIOGRAM COMPLETE
Area-P 1/2: 3.58 cm2
Height: 71 in
S' Lateral: 3.1 cm
Weight: 1960 oz

## 2022-04-28 LAB — ALBUMIN: Albumin: 2.8 g/dL — ABNORMAL LOW (ref 3.5–5.0)

## 2022-04-28 LAB — VITAMIN B12: Vitamin B-12: 448 pg/mL (ref 180–914)

## 2022-04-28 LAB — TSH: TSH: 0.878 u[IU]/mL (ref 0.350–4.500)

## 2022-04-28 LAB — CK: Total CK: 326 U/L (ref 49–397)

## 2022-04-28 LAB — PREALBUMIN: Prealbumin: 9.5 mg/dL — ABNORMAL LOW (ref 18–38)

## 2022-04-28 MED ORDER — OLANZAPINE 10 MG IM SOLR: 2.5000 mg | Freq: Every day | INTRAMUSCULAR | Status: DC | PRN

## 2022-04-28 MED ORDER — OLANZAPINE 2.5 MG PO TABS: 2.5000 mg | ORAL_TABLET | Freq: Every day | ORAL | Status: DC | PRN

## 2022-04-28 MED ORDER — HYDROCERIN EX CREA
TOPICAL_CREAM | Freq: Two times a day (BID) | CUTANEOUS | Status: DC
  Filled 2022-04-28: qty 113

## 2022-04-28 MED ORDER — DOXYCYCLINE HYCLATE 100 MG PO TABS
100.0000 mg | ORAL_TABLET | Freq: Two times a day (BID) | ORAL | Status: DC
  Administered 2022-04-28 – 2022-05-02 (×9): 100 mg via ORAL
  Filled 2022-04-28 (×10): qty 1

## 2022-04-28 MED ORDER — HALOPERIDOL LACTATE 5 MG/ML IJ SOLN
2.0000 mg | Freq: Once | INTRAMUSCULAR | Status: AC | PRN
Start: 2022-04-28 — End: 2022-04-28
  Administered 2022-04-28: 2 mg via INTRAVENOUS
  Filled 2022-04-28: qty 1

## 2022-04-28 MED ORDER — NICOTINE POLACRILEX 2 MG MT GUM
2.0000 mg | CHEWING_GUM | OROMUCOSAL | Status: DC | PRN
Start: 2022-04-28 — End: 2022-05-03

## 2022-04-28 MED ORDER — ALBUTEROL SULFATE (2.5 MG/3ML) 0.083% IN NEBU
2.5000 mg | INHALATION_SOLUTION | Freq: Three times a day (TID) | RESPIRATORY_TRACT | Status: DC
  Administered 2022-04-29 – 2022-04-30 (×4): 2.5 mg via RESPIRATORY_TRACT
  Filled 2022-04-28 (×4): qty 3

## 2022-04-28 NOTE — Assessment & Plan Note (Signed)
HCTZ 

## 2022-04-28 NOTE — Assessment & Plan Note (Signed)
Follow urine cx, needs additional w/u

## 2022-04-28 NOTE — Evaluation (Signed)
Clinical/Bedside Swallow Evaluation Patient Details  Name: [name] MRN: [id number] Date of Birth: [birth year]  Today's Date: 04/28/2022 Time: SLP Start Time (ACUTE ONLY): 0935 SLP Stop Time (ACUTE ONLY): 0950 SLP Time Calculation (min) (ACUTE ONLY): 15 min  Past Medical History:  Past Medical History:  Diagnosis Date   Asthma    as a child   Carotid arterial disease (HCC)    a. 11/2015 Carotid U/S: LICA 40-59%, RICA 100 CTO, nl subclavian arteries--f/u 1 yr.   Coronary atherosclerosis of native coronary artery    a. 09/2001 inflat MI/PCI: RCA 19m (3.[address] S7 BMS); b. 07/2006 Cath: LM nl, LAD 50p, LCX min irregs, OM1 large, min irregs, OM2 min irregs, RCA 20 ISR;  c. 10/2012 MV: EF 74%, no ischemia.   Essential hypertension    Heart murmur    since a child   Hyperlipidemia    Myocardial infarction [facility]) 2002   Pneumonia    age 44   Right inguinal hernia    Past Surgical History:  Past Surgical History:  Procedure Laterality Date   CARDIAC CATHETERIZATION     Carotid stents     COLONOSCOPY     FRACTURE SURGERY     HERNIA REPAIR     ventral   INGUINAL HERNIA REPAIR Right 11/01/2016   Procedure: RIGHT INGUINAL HERNIA REPAIR WITH MESH;  Surgeon: Avel Peace, MD;  Location: [facility] OR;  Service: General;  Laterality: Right;   INSERTION OF MESH Right 11/01/2016   Procedure: INSERTION OF MESH;  Surgeon: Avel Peace, MD;  Location: [facility] OR;  Service: General;  Laterality: Right;   ORIF SHOULDER FRACTURE Right    HPI:  Patient is an 81 y.o. male with PMH: HLD, CAD s/p stenting, HTN, COPD gold stage III, chronic hypoxic failure, non compliant with home oxygen and COPD medications, continues to smoke cigarettes. He presented to the hospital from home after being found down on garage floor by a family member, awake and confused. Family reported that patient seems to be depressed, which started in October of 2022 when his wife had to transition to living in a memory care unit. He has  had multiple falls this year but no h/o head injury and he is not on blood thinners. He refuses to use a cane or walker. He was made NPO awaiting speech swallow evaluation.    Assessment / Plan / Recommendation  Clinical Impression  Patient currently presenting with an oropharyngeal swallow that appears to be [facility] as per this bedside/clinical swallow evaluation. He did not exhibit any immediate coughing or throat clearing after drinking thin liquids (water, juice) and eating solids (saltine cracker). He did exhibit a couple instances of delayed coughing however this appeared related to like secretions/phlegm as opposed to penetration or aspiration. Patient and son both deny any h/o dysphagia and per son, patient eats throughout the day. SLP is not recommending further skilled intervention/assessment of swallow function at this time and recommending initiate regular texture solids, thin liquids. Please reorder if patient exhibiting s/s concerning for dysphagia. Thank you for this referral! SLP Visit Diagnosis: Dysphagia, unspecified (R13.10)    Aspiration Risk  No limitations    Diet Recommendation Regular;Thin liquid   Liquid Administration via: Straw;Cup Medication Administration: Whole meds with liquid Supervision: Patient able to self feed Compensations: Slow rate;Small sips/bites    Other  Recommendations Oral Care Recommendations: Oral care BID    Recommendations for follow up therapy are one component of a multi-disciplinary discharge planning process, led  by the attending physician.  Recommendations may be updated based on patient status, additional functional criteria and insurance authorization.  Follow up Recommendations No SLP follow up      Assistance Recommended at Discharge Frequent or constant Supervision/Assistance  Functional Status Assessment Patient has had a recent decline in their functional status and demonstrates the ability to make significant improvements in function  in a reasonable and predictable amount of time.  Frequency and Duration   N/A         Prognosis   N/A     Swallow Study   General Date of Onset: 04/27/22 HPI: Patient is an 81 y.o. male with PMH: HLD, CAD s/p stenting, HTN, COPD gold stage III, chronic hypoxic failure, non compliant with home oxygen and COPD medications, continues to smoke cigarettes. He presented to the hospital from home after being found down on garage floor by a family member, awake and confused. Family reported that patient seems to be depressed, which started in October of 2022 when his wife had to transition to living in a memory care unit. He has had multiple falls this year but no h/o head injury and he is not on blood thinners. He refuses to use a cane or walker. He was made NPO awaiting speech swallow evaluation. Type of Study: Bedside Swallow Evaluation Previous Swallow Assessment: none found Diet Prior to this Study: NPO Temperature Spikes Noted: No Respiratory Status: Room air History of Recent Intubation: No Behavior/Cognition: Alert;Cooperative;Lethargic/Drowsy Oral Care Completed by SLP: No Oral Cavity - Dentition: Dentures, top;Dentures, bottom Vision: Functional for self-feeding Self-Feeding Abilities: Able to feed self Patient Positioning: Upright in bed Baseline Vocal Quality: Normal;Hoarse Volitional Cough: Strong Volitional Swallow: Able to elicit    Oral/Motor/Sensory Function Overall Oral Motor/Sensory Function: Within functional limits   Ice Chips     Thin Liquid Thin Liquid: Impaired Presentation: Straw;Self Fed Pharyngeal  Phase Impairments: Cough - Delayed    Nectar Thick     Honey Thick     Puree Puree: Not tested   Solid     Solid: Within functional limits Presentation: Self Fed     Angela Nevin, MA, CCC-SLP Speech Therapy

## 2022-04-28 NOTE — Assessment & Plan Note (Signed)
Needs podiatry follow up

## 2022-04-28 NOTE — Assessment & Plan Note (Addendum)
Wheezing noted on admission, continues to improve at this time Continue scheduled and prn nebs Steroids, abx

## 2022-04-28 NOTE — Evaluation (Signed)
Physical Therapy Evaluation  Patient Details Name: [name] MRN: [id number] DOB: Feb 04, 1941 Today's Date: 04/28/2022  History of Present Illness  Pt is an 81 y/o male who presents 04/27/22 after being found down on the garage floor, awake and confused. MRI negative for acute changes. PMH significant for asthma, CAD, essential HTN, heart murmur, MI, R inguinal hernia s/p repair 11/01/2016, ventral hernia repair, ORIF shoulder fracture, multiple falls.   Clinical Impression  Pt admitted with above diagnosis. Pt currently with functional limitations due to the deficits listed below (see PT Problem List). At the time of PT eval pt was able to perform transfers and ambulation with up to +2 mod assist, mainly for safety. Pt's son present at beginning of session who reports family is willing and able to provide 24 hour support and/or hire caregivers to come in to assist pt. Pt demonstrating a decline in function and will require increased supervision/assist to return home at this time. Discussed with pt at end of session and he was agreeable to this. Recommend home health therapies to maximize functional independence, and decrease risk for falls at home. Acutely, pt will benefit from skilled PT to increase their independence and safety with mobility to allow discharge to the venue listed below.          Recommendations for follow up therapy are one component of a multi-disciplinary discharge planning process, led by the attending physician.  Recommendations may be updated based on patient status, additional functional criteria and insurance authorization.  Follow Up Recommendations Home health PT      Assistance Recommended at Discharge Frequent or constant Supervision/Assistance  Patient can return home with the following  A lot of help with walking and/or transfers;A lot of help with bathing/dressing/bathroom;Assistance with cooking/housework;Direct supervision/assist for medications  management;Direct supervision/assist for financial management;Assist for transportation;Help with stairs or ramp for entrance    Equipment Recommendations Rolling walker (2 wheels)  Recommendations for Other Services       Functional Status Assessment Patient has had a recent decline in their functional status and demonstrates the ability to make significant improvements in function in a reasonable and predictable amount of time.     Precautions / Restrictions Precautions Precautions: Fall Restrictions Weight Bearing Restrictions: No      Mobility  Bed Mobility Overal bed mobility: Needs Assistance Bed Mobility: Supine to Sit     Supine to sit: Mod assist, +2 for physical assistance, HOB elevated     General bed mobility comments: Pt initiating transition to EOB, however unable to elevate trunk to full sitting position. Pt reaching out for HHA and required up to +2 mod assist to get to EOB and get feet on floor.    Transfers Overall transfer level: Needs assistance Equipment used: 1 person hand held assist, Rolling walker (2 wheels) Transfers: Sit to/from Stand Sit to Stand: Min assist           General transfer comment: Heavy min assist to power up to full stand without AD. Increased time to gain/maintain standing balance as he demonstrated a strong posterior lean. With RW, still required min assist however pt able to gain standing balance easier. VC's each time for hand placement on seated surface for safety.    Ambulation/Gait Ambulation/Gait assistance: Min assist, Mod assist Gait Distance (Feet): 150 Feet Assistive device: Rolling walker (2 wheels), 1 person hand held assist Gait Pattern/deviations: Decreased stride length, Shuffle, Step-through pattern, Trunk flexed, Drifts right/left Gait velocity: Decreased Gait velocity interpretation: 1.31 -  2.62 ft/sec, indicative of limited community ambulator   General Gait Details: HHA to the sink in room, and pt with  shuffling gait pattern. With RW, shuffling improved. Up to mod assist required for balance and walker management.  Stairs            Wheelchair Mobility    Modified Rankin (Stroke Patients Only)       Balance Overall balance assessment: Needs assistance Sitting-balance support: Feet supported, No upper extremity supported Sitting balance-Leahy Scale: Poor   Postural control: Posterior lean Standing balance support: Bilateral upper extremity supported, During functional activity, Reliant on assistive device for balance Standing balance-Leahy Scale: Poor Standing balance comment: Posterior lean and R lateral lean                             Pertinent Vitals/Pain Pain Assessment Pain Assessment: No/denies pain    Home Living Family/patient expects to be discharged to:: Private residence Living Arrangements: Alone Available Help at Discharge: Family;Available 24 hours/day Type of Home: House Home Access: Stairs to enter Entrance Stairs-Rails: Right Entrance Stairs-Number of Steps: 2   Home Layout: One level Home Equipment: Shower seat;Cane - single point (Portable O2)      Prior Function Prior Level of Function : Independent/Modified Independent;Driving             Mobility Comments: Son reports ongoing ambulation dysfunction but refuses cane. Pt drives to [employer] or to [employer] to see his wife. Son takes him anywhere else he needs to go. ADLs Comments: Children pick up most of his groceries. He goes to the store mainly for cigarettes. Still indpendent with ADL's.     Hand Dominance   Dominant Hand: Right    Extremity/Trunk Assessment   Upper Extremity Assessment Upper Extremity Assessment: Defer to OT evaluation    Lower Extremity Assessment Lower Extremity Assessment: Generalized weakness;RLE deficits/detail;LLE deficits/detail RLE Deficits / Details: Bilaterally, pt with 4/5 strength in hip flexors, quads, hamstrings, and ankle  dorsiflexors.    Cervical / Trunk Assessment Cervical / Trunk Assessment: Other exceptions Cervical / Trunk Exceptions: Forward head posture with rounded shoulders  Communication   Communication: HOH  Cognition Arousal/Alertness: Awake/alert Behavior During Therapy: Flat affect Overall Cognitive Status: Impaired/Different from baseline Area of Impairment: Orientation, Attention, Following commands, Memory, Safety/judgement, Awareness, Problem solving                 Orientation Level: Disoriented to, Time, Situation Current Attention Level: Sustained Memory: Decreased short-term memory Following Commands: Follows one step commands consistently, Follows one step commands with increased time, Follows multi-step commands inconsistently Safety/Judgement: Decreased awareness of safety, Decreased awareness of deficits Awareness: Intellectual Problem Solving: Slow processing, Difficulty sequencing, Decreased initiation, Requires verbal cues          General Comments      Exercises     Assessment/Plan    PT Assessment Patient needs continued PT services  PT Problem List Decreased strength;Decreased activity tolerance;Decreased balance;Decreased mobility;Decreased cognition;Decreased knowledge of use of DME;Decreased coordination;Decreased safety awareness;Decreased knowledge of precautions       PT Treatment Interventions DME instruction;Gait training;Stair training;Functional mobility training;Therapeutic activities;Therapeutic exercise;Balance training;Neuromuscular re-education;Cognitive remediation;Patient/family education    PT Goals (Current goals can be found in the Care Plan section)  Acute Rehab PT Goals Patient Stated Goal: Return to his home PT Goal Formulation: With patient/family Time For Goal Achievement: 05/12/22 Potential to Achieve Goals: Good    Frequency Min 3X/week  Co-evaluation PT/OT/SLP Co-Evaluation/Treatment: Yes Reason for Co-Treatment:  For patient/therapist safety;To address functional/ADL transfers PT goals addressed during session: Balance;Mobility/safety with mobility;Proper use of DME         AM-PAC PT "6 Clicks" Mobility  Outcome Measure Help needed turning from your back to your side while in a flat bed without using bedrails?: A Little Help needed moving from lying on your back to sitting on the side of a flat bed without using bedrails?: A Little Help needed moving to and from a bed to a chair (including a wheelchair)?: A Little Help needed standing up from a chair using your arms (e.g., wheelchair or bedside chair)?: A Little Help needed to walk in hospital room?: A Little Help needed climbing 3-5 steps with a railing? : A Little 6 Click Score: 18    End of Session Equipment Utilized During Treatment: Gait belt Activity Tolerance: Patient tolerated treatment well Patient left: in chair;with call bell/phone within reach Nurse Communication: Mobility status PT Visit Diagnosis: Unsteadiness on feet (R26.81);Other abnormalities of gait and mobility (R26.89);Difficulty in walking, not elsewhere classified (R26.2)    Time: 1012-1037 (Plus 12 minutes earlier in AM, session interrupted.) PT Time Calculation (min) (ACUTE ONLY): 25 min   Charges:   PT Evaluation $PT Eval Moderate Complexity: 1 Mod PT Treatments $Gait Training: 8-22 mins        Conni Slipper, PT, DPT Acute Rehabilitation Services Secure Chat Preferred Office: [phone]   Marylynn Pearson 04/28/2022, 11:19 AM

## 2022-04-28 NOTE — Evaluation (Signed)
Occupational Therapy Evaluation Patient Details Name: [name] MRN: [id number] DOB: 1941/05/07 Today's Date: 04/28/2022   History of Present Illness Pt is an 81 y/o male who presents 04/27/22 after being found down on the garage floor, awake and confused. MRI negative for acute changes. PMH significant for asthma, CAD, essential HTN, heart murmur, MI, R inguinal hernia s/p repair 11/01/2016, ventral hernia repair, ORIF shoulder fracture, multiple falls.   Clinical Impression   Pt independent at baseline with ADLs and functional mobility, however decreased participation in ADLs and "staggers" when walking. Pt currently min guard-mod A for ADLs, mod A +2 for bed mobility, and min A for transfers with RW and 1 person handheld assist. Pt with decreased ROM and strength in BUE R > L, and impaired coordination. Pt with difficulty performing fine motor tasks and standing at sink without UE support. Pt presenting with impairments listed below, will follow acutely. Recommend frequent supervision at home and HHOT upon d/c.     Recommendations for follow up therapy are one component of a multi-disciplinary discharge planning process, led by the attending physician.  Recommendations may be updated based on patient status, additional functional criteria and insurance authorization.   Follow Up Recommendations  Home health OT    Assistance Recommended at Discharge Frequent or constant Supervision/Assistance  Patient can return home with the following A little help with walking and/or transfers;A lot of help with bathing/dressing/bathroom;Assistance with cooking/housework;Direct supervision/assist for medications management;Direct supervision/assist for financial management;Assist for transportation;Help with stairs or ramp for entrance    Functional Status Assessment  Patient has had a recent decline in their functional status and demonstrates the ability to make significant improvements in function in a  reasonable and predictable amount of time.  Equipment Recommendations  BSC/3in1 (as shower seat)    Recommendations for Other Services       Precautions / Restrictions Precautions Precautions: Fall Restrictions Weight Bearing Restrictions: No      Mobility Bed Mobility Overal bed mobility: Needs Assistance Bed Mobility: Supine to Sit     Supine to sit: Mod assist, +2 for physical assistance, HOB elevated          Transfers Overall transfer level: Needs assistance Equipment used: 1 person hand held assist, Rolling walker (2 wheels) Transfers: Sit to/from Stand Sit to Stand: Min assist           General transfer comment: min to stand, increaseing to mod during dynamic standing task without UE support      Balance Overall balance assessment: Needs assistance Sitting-balance support: Feet supported, No upper extremity supported Sitting balance-Leahy Scale: Poor   Postural control: Posterior lean Standing balance support: Bilateral upper extremity supported, During functional activity, Reliant on assistive device for balance Standing balance-Leahy Scale: Poor Standing balance comment: Posterior lean and R lateral lean                           ADL either performed or assessed with clinical judgement   ADL Overall ADL's : Needs assistance/impaired Eating/Feeding: Set up;Sitting   Grooming: Min guard;Oral care;Standing   Upper Body Bathing: Minimal assistance;Sitting   Lower Body Bathing: Moderate assistance;Sitting/lateral leans   Upper Body Dressing : Minimal assistance;Sitting   Lower Body Dressing: Moderate assistance;Sitting/lateral leans   Toilet Transfer: Minimal assistance;Rolling walker (2 wheels);Ambulation   Toileting- Clothing Manipulation and Hygiene: Minimal assistance;Sitting/lateral lean;Sit to/from stand       Functional mobility during ADLs: Minimal assistance;Rolling walker (2 wheels)  Vision   Vision  Assessment?: No apparent visual deficits     Perception     Praxis      Pertinent Vitals/Pain Pain Assessment Pain Assessment: No/denies pain     Hand Dominance Right   Extremity/Trunk Assessment Upper Extremity Assessment Upper Extremity Assessment: Generalized weakness;RUE deficits/detail RUE Deficits / Details: ~25% shoulder flexion, weak grasp, son reports hx of shoulder injury RUE Coordination: decreased fine motor;decreased gross motor   Lower Extremity Assessment Lower Extremity Assessment: Defer to PT evaluation RLE Deficits / Details: Bilaterally, pt with 4/5 strength in hip flexors, quads, hamstrings, and ankle dorsiflexors.   Cervical / Trunk Assessment Cervical / Trunk Assessment: Other exceptions Cervical / Trunk Exceptions: Forward head posture with rounded shoulders   Communication Communication Communication: HOH   Cognition Arousal/Alertness: Awake/alert Behavior During Therapy: Flat affect Overall Cognitive Status: Impaired/Different from baseline Area of Impairment: Orientation, Attention, Following commands, Memory, Safety/judgement, Awareness, Problem solving                 Orientation Level: Disoriented to, Time, Situation Current Attention Level: Sustained Memory: Decreased short-term memory Following Commands: Follows one step commands consistently, Follows one step commands with increased time, Follows multi-step commands inconsistently Safety/Judgement: Decreased awareness of safety, Decreased awareness of deficits Awareness: Intellectual Problem Solving: Slow processing, Difficulty sequencing, Decreased initiation, Requires verbal cues       General Comments  VSS on RA, son present in room at start of session    Exercises     Shoulder Instructions      Home Living Family/patient expects to be discharged to:: Private residence Living Arrangements: Alone Available Help at Discharge: Family;Available 24 hours/day Type of Home:  House Home Access: Stairs to enter [employer] of Steps: 2 Entrance Stairs-Rails: Right Home Layout: One level     Bathroom Shower/Tub: Producer, television/film/video: Standard     Home Equipment: Shower seat;Cane - single point (portable O2)          Prior Functioning/Environment Prior Level of Function : Independent/Modified Independent;Driving             Mobility Comments: Son reports ongoing ambulation dysfunction but refuses cane. Pt drives to [employer] or to [employer] to see his wife. Son takes him anywhere else he needs to go. ADLs Comments: Children pick up most of his groceries. He goes to the store mainly for cigarettes. Still indpendent with ADL's but has not been completing them regularly        OT Problem List: Decreased strength;Decreased range of motion;Decreased activity tolerance;Impaired balance (sitting and/or standing);Decreased cognition;Decreased safety awareness;Decreased knowledge of use of DME or AE;Impaired UE functional use      OT Treatment/Interventions: Self-care/ADL training;Therapeutic exercise;Energy conservation;Therapeutic activities;Cognitive remediation/compensation;Visual/perceptual remediation/compensation;Patient/family education;Balance training    OT Goals(Current goals can be found in the care plan section) Acute Rehab OT Goals Patient Stated Goal: none stated OT Goal Formulation: With patient Time For Goal Achievement: 05/12/22 Potential to Achieve Goals: Good ADL Goals Pt Will Perform Upper Body Dressing: with supervision;sitting;standing Pt Will Perform Lower Body Dressing: with min assist;sitting/lateral leans;sit to/from stand Pt Will Transfer to Toilet: with min assist;ambulating;regular height toilet Pt Will Perform Tub/Shower Transfer: Tub transfer;Shower transfer;3 in 1;rolling walker;with min assist  OT Frequency: Min 2X/week    Co-evaluation PT/OT/SLP Co-Evaluation/Treatment: Yes Reason for  Co-Treatment: For patient/therapist safety;To address functional/ADL transfers PT goals addressed during session: Balance;Mobility/safety with mobility;Proper use of DME OT goals addressed during session: ADL's and self-care;Strengthening/ROM  AM-PAC OT "6 Clicks" Daily Activity     Outcome Measure Help from another person eating meals?: A Little Help from another person taking care of personal grooming?: A Little Help from another person toileting, which includes using toliet, bedpan, or urinal?: A Little Help from another person bathing (including washing, rinsing, drying)?: A Lot Help from another person to put on and taking off regular upper body clothing?: A Little Help from another person to put on and taking off regular lower body clothing?: A Lot 6 Click Score: 16   End of Session Equipment Utilized During Treatment: Gait belt;Rolling walker (2 wheels) Nurse Communication: Mobility status  Activity Tolerance: Patient tolerated treatment well Patient left: in chair;with call bell/phone within reach;with chair alarm set  OT Visit Diagnosis: Unsteadiness on feet (R26.81);Other abnormalities of gait and mobility (R26.89);Muscle weakness (generalized) (M62.81);Other symptoms and signs involving cognitive function                Time: 9373-4287 OT Time Calculation (min): 25 min Charges:  OT General Charges $OT Visit: 1 Visit OT Evaluation $OT Eval Moderate Complexity: 1 [address], OTD, OTR/L Acute Rehab [phone]) [phone]   [name] 04/28/2022, 11:34 AM

## 2022-04-28 NOTE — Hospital Course (Signed)
[name] is [name] 81 y.o. male with medical history significant of COPD Gold stage III, chronic hypoxic failure, noncompliant with home O2 and COPD medications, HTN, HLD, CAD status post stenting, cigarette smoke, presented with fall and altered mentations.   He was seen by neurology due to suspected stroke.  Symptoms thought possibly related to fall related to COPD exacerbation (confusion related to hypoxia or hypercarbia -> none documented on blood gas).  He had 7 second pause on 7/3 and EP is planning for pacemaker placement on 7/6.  See below for additional details

## 2022-04-28 NOTE — Assessment & Plan Note (Signed)
Encourage cessation Nicotine patch

## 2022-04-28 NOTE — Progress Notes (Signed)
Pt found on RA with sat of 85%.  RT added Gaffney at this time at 2LPM.

## 2022-04-28 NOTE — Assessment & Plan Note (Addendum)
Resolved at this point, at admission thought due to COPD exacerbation He's improved now, with treatment Delirium precautions B12, TSH wnl.  VBG without hypercarbia Pending B1 zyprexa daily prn per psych

## 2022-04-28 NOTE — Assessment & Plan Note (Signed)
resolved 

## 2022-04-28 NOTE — Assessment & Plan Note (Addendum)
Code stroke was called on admission, thought to have mild R sided motor deficits, mild ataxia/cognitive/communication deficits  Neurology recommended stroke workup MRI brain without acute abnormality. CTA head/neck with right ICA occluded at bifurcation with reconstitution at level of R posterior communicating artery, less than 50% stenosis of L ICA at bifurcation, moderate stenosis of up to 70% in the R vertebral artery as it enters spinal canal at C6, moderate stenosis of proximal L posterior cerebral artery Echo with EF 60-65%, grade II diastolic dysfunction  Appreciate neurology assistance -> recommending aspirin for stroke prevention

## 2022-04-28 NOTE — Progress Notes (Addendum)
STROKE TEAM PROGRESS NOTE   INTERVAL HISTORY No family at the bedside. Patient seen in room sitting in chair. He remembers his fall and states that he tripped over his feet. He denies hitting his head. He states that he feels just fine.   Vitals:   04/28/22 0030 04/28/22 0430 04/28/22 0716 04/28/22 0740  BP:  (!) 119/52 121/67   Pulse:  62 (!) 47   Resp:  16 18   Temp:  97.9 F (36.6 C) 98.3 F (36.8 C)   TempSrc:  Oral    SpO2:  90%  96%  Weight: 55.6 kg     Height: 5\' 11"  (1.803 m)      CBC:  Recent Labs  Lab 04/27/22 1143 04/28/22 0307  WBC 5.3 4.9  NEUTROABS 4.3  --   HGB 13.9 11.9*  HCT 41.9 35.6*  MCV 94.2 93.0  PLT 242 [id number]   Basic Metabolic Panel:  Recent Labs  Lab 04/27/22 1143 04/28/22 0027  NA 137 135  K 5.5* 3.8  CL 96* 106  CO2 27 19*  GLUCOSE 96 129*  BUN 15 5*  CREATININE 1.17 0.65  CALCIUM 8.4* 9.1   Lipid Panel: No results for input(s): "CHOL", "TRIG", "HDL", "CHOLHDL", "VLDL", "LDLCALC" in the last 168 hours. HgbA1c: No results for input(s): "HGBA1C" in the last 168 hours. Urine Drug Screen:  Recent Labs  Lab 04/27/22 1630  LABOPIA NONE DETECTED  COCAINSCRNUR NONE DETECTED  LABBENZ NONE DETECTED  AMPHETMU NONE DETECTED  THCU NONE DETECTED  LABBARB NONE DETECTED    Alcohol Level  Recent Labs  Lab 04/27/22 1143  ETH <10    IMAGING past 24 hours MR BRAIN WO CONTRAST  Result Date: 04/27/2022 CLINICAL DATA:  TIA. New onset of leg weakness. Question left parietal infarct. EXAM: MRI HEAD WITHOUT CONTRAST TECHNIQUE: Multiplanar, multiecho pulse sequences of the brain and surrounding structures were obtained without intravenous contrast. COMPARISON:  CT head without contrast and CTA head and neck 04/27/2022 FINDINGS: Brain: Atrophy and white matter changes are moderately advanced for age. The ventricles are proportionate to the degree of atrophy. No acute infarct, hemorrhage, or mass lesion is present. Remote lacunar infarct is present along  the posterior limb of the left external capsule. Basal ganglia are intact. Insular ribbon is normal. The internal auditory canals are within normal limits. The brainstem and cerebellum are within normal limits. Vascular: Flow is present in the major intracranial arteries. Skull and upper cervical spine: The craniocervical junction is normal. Upper cervical spine is within normal limits. Marrow signal is unremarkable. Sinuses/Orbits: Small mastoid effusions are present. No obstructing nasopharyngeal lesion is present. The paranasal sinuses and mastoid air cells are otherwise clear. The globes and orbits are within normal limits. Other: IMPRESSION: 1. No acute intracranial abnormality. 2. Atrophy and white matter disease is moderately advanced for age. This likely reflects the sequela of chronic microvascular ischemia. 3. Remote lacunar infarct of the posterior limb of the left external capsule. Electronically Signed   By: San Morelle M.D.   On: 04/27/2022 18:27   CT ANGIO HEAD NECK W WO CM W PERF (CODE STROKE)  Result Date: 04/27/2022 CLINICAL DATA:  Code stroke. Question left parietal infarction. EXAM: CT ANGIOGRAPHY HEAD AND NECK CT PERFUSION BRAIN TECHNIQUE: Multidetector CT imaging of the head and neck was performed using the standard protocol during bolus administration of intravenous contrast. Multiplanar CT image reconstructions and MIPs were obtained to evaluate the vascular anatomy. Carotid stenosis measurements (when applicable) are obtained  utilizing NASCET criteria, using the distal internal carotid diameter as the denominator. Multiphase CT imaging of the brain was performed following IV bolus contrast injection. Subsequent parametric perfusion maps were calculated using RAPID software. RADIATION DOSE REDUCTION: This exam was performed according to the departmental dose-optimization program which includes automated exposure control, adjustment of the mA and/or kV according to patient size  and/or use of iterative reconstruction technique. CONTRAST:  135mL OMNIPAQUE IOHEXOL 350 MG/ML SOLN COMPARISON:  CT head without contrast 04/27/2022. FINDINGS: CTA NECK FINDINGS Aortic arch: Atherosclerotic calcifications are present at the aortic arch and great vessel origins without focal aneurysm or stenosis. Right carotid system: Atherosclerotic calcifications are present in the distal right common carotid artery. The right internal carotid artery is occluded. No reconstitution in the neck. Left carotid system: The left common carotid artery demonstrates some distal calcification. Dense calcifications are present at the bifurcation. The lumen is narrowed to 2.3 mm, less than 50% stenosis relative to the more normal distal left ICA. Vertebral arteries: The vertebral arteries are codominant. Both vertebral arteries originate from the subclavian arteries without significant stenosis. Moderate narrowing of up to 50% is present in the right vertebral artery as it enters the spinal canal at C6. No other significant focal stenosis is present in either vertebral artery in the neck. Skeleton: Degenerative changes are present in the cervical spine with grade 1 anterolisthesis at C3-4 and C4-5. Marked endplate changes are present at C5-6 and C6-7. No focal osseous lesions are present. The patient is edentulous. Other neck: Diffuse subcutaneous edema is present in the neck. No discrete lesion or abscess is present. No significant adenopathy is present. The submandibular and parotid glands and ducts are within normal limits. No focal mucosal lesions are present. The thyroid is normal. Upper chest: Centrilobular emphysematous changes are present. Scarring is present at both lung apices. Thoracic inlet is within normal limits. Review of the MIP images confirms the above findings CTA HEAD FINDINGS Anterior circulation: The right internal carotid artery is reconstituted at the level of the posterior communicating artery.  Atherosclerotic calcifications are present within the cavernous left ICA without a significant stenosis relative to the ICA terminus. The left A1 segment is dominant. The anterior communicating artery is patent. The MCA scratched at the M1 segments are normal bilaterally. MCA bifurcations are within normal limits. The ACA and MCA branch vessels are within normal limits. Posterior circulation: Moderate stenosis is present at the dural margin of the right vertebral artery. The intracranial left vertebral artery is the dominant vessel. PICA origins are visualized and normal. Vertebrobasilar junction is normal. Basilar artery is normal. Both posterior cerebral arteries originate from basilar tip. Moderate stenosis is present in the proximal left posterior cerebral artery. Pial collateral vessels contribute. Venous sinuses: The dural sinuses are patent. The straight sinus deep cerebral veins are intact. Cortical veins are within normal limits. No significant vascular malformation is evident. Anatomic variants: Prominent right posterior communicating artery reconstituting the right ICA. Review of the MIP images confirms the above findings CT Brain Perfusion Findings: ASPECTS: 10/10 CBF (<30%) Volume: 27mL Perfusion (Tmax>6.0s) volume: 73mL Mismatch Volume: 64mL Infarction Location:N/A IMPRESSION: 1. CT perfusion demonstrates no acute infarct or significant area of ischemia. 2. 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 3. Less than 50% stenosis of the left internal carotid artery at the bifurcation. 4. Moderate stenosis of up to 70% in the right vertebral artery as it enters  the spinal canal at C6. 5. Moderate stenosis of the proximal left posterior cerebral artery with pial collaterals filling the territory. 6. Aortic Atherosclerosis (ICD10-I70.0) and Emphysema (ICD10-J43.9). Electronically Signed   By: Marin Roberts  M.D.   On: 04/27/2022 12:36   CT HEAD CODE STROKE WO CONTRAST`  Result Date: 04/27/2022 CLINICAL DATA:  Code stroke.  New onset of leg weakness. Fell. EXAM: CT HEAD WITHOUT CONTRAST TECHNIQUE: Contiguous axial images were obtained from the base of the skull through the vertex without intravenous contrast. RADIATION DOSE REDUCTION: This exam was performed according to the departmental dose-optimization program which includes automated exposure control, adjustment of the mA and/or kV according to patient size and/or use of iterative reconstruction technique. COMPARISON:  None Available. FINDINGS: Brain: No acute infarct, hemorrhage, or mass lesion is present. Moderate atrophy and white matter changes are present bilaterally. Basal ganglia are intact. Insular ribbon is normal. No acute or focal cortical abnormality is present. The ventricles are proportionate to the degree of atrophy. No significant extraaxial fluid collection is present. The brainstem and cerebellum are within normal limits. Vascular: Atherosclerotic calcifications are present within the cavernous internal carotid arteries. Hyperdense vessel is present. Skull: Calvarium is intact. No focal lytic or blastic lesions are present. No significant extracranial soft tissue lesion is present. Sinuses/Orbits: Shrunken maxillary sinuses are present bilaterally. No acute sinus disease is present. The globes and orbits are within normal limits. ASPECTS [facility] Stroke Program Early CT Score) - Ganglionic level infarction (caudate, lentiform nuclei, internal capsule, insula, M1-M3 cortex): 7/7 - Supraganglionic infarction (M4-M6 cortex): 3/3 Total score (0-10 with 10 being normal): 10/10 IMPRESSION: 1. No acute intracranial abnormality. 2. Moderate atrophy and white matter disease likely reflects the sequela of chronic microvascular ischemia. 3. ASPECTS is 10/10. * Electronically Signed   By: Marin Roberts M.D.   On: 04/27/2022 12:03    PHYSICAL  EXAM Mental Status: Lethargic with increased latencies of verbal responses and hypophonic, dysarhric speech. Poor orientation. Speech output is sparse with short phrases only and mildly dysphasic. In the context of being [facility], he follows all simple commands. Answers one LOC question incorrectly.  Cranial Nerves: II: Temporal visual fields intact with no extinction to DSS. PERRL.  III,IV, VI: EOMI. No nystagmus.  V,VII: Reacts to touch bilaterally. Smile symmetric VIII: HOH IX,X: Hypophonic speech XI: Symmetric  XII: Midline tongue extension Motor: LUE and LLE 5/5 RUE and RLE 4+/5 with drift of both limbs Sensory: Grossly intact to touch x 4. No extinction to DSS.  Deep Tendon Reflexes: Unremarkable with no asymmetry noted.  Plantars: Equivocal bilaterally  Cerebellar: Mild right sided limb ataxia Gait: Deferred  ASSESSMENT/PLAN [name] is a 81 y.o. male with history of  COPD Gold stage III, chronic hypoxic failure, noncompliant with home O2 and COPD medications, HTN, HLD, CAD status post stenting, cigarette smoke, presented with fall and altered mental status. MRI negative. HgbA1C is elevtated at 7.6, will need to follow up with PCP.   Fall and altered mental status  Code Stroke CT head No acute abnormality. Small vessel disease. Atrophy. ASPECTS 10.    CTA head & neck - The right internal carotid artery is occluded at the bifurcation with reconstitution at the level of the right posterior communicating artery. Less than 50% stenosis of the left internal carotid artery at the bifurcation. Moderate stenosis of up to 70% in the right vertebral artery as it enters the spinal canal at C6. MRI  No acute intracranial abnormality 2D  Echo Pending LDL 36 HgbA1c 7.6 VTE prophylaxis - lovenox    Diet   Diet Heart Room service appropriate? Yes; Fluid consistency: Thin   aspirin 81 mg daily prior to admission, now on aspirin 81 mg daily.  Therapy recommendations:  Pending Disposition:   Pending  Hypertension Home meds:  hydrochlorothiazide Stable Permissive hypertension (OK if < 220/120) but gradually normalize in 5-7 days Long-term BP goal normotensive  Hyperlipidemia Home meds:  crestor 40mg  and zetia, resumed in hospital LDL 36, goal < 70 Continue statin at discharge  Diabetes type II Controlled Home meds:  None HgbA1c 7.6, goal < 7.0 CBGs No results for input(s): "GLUCAP" in the last 72 hours.  SSI  Other Stroke Risk Factors Advanced Age >/= 63  Coronary artery disease  Other Active Problems COPD  Hospital day # 1  Patient seen and examined by NP/APP with MD. MD to update note as needed.   76, DNP, FNP-BC [facility] Pager: [phone]   ATTENDING ATTESTATION:  81 year old gentleman who fell admitted for stroke work-up.  MRI is negative except for atrophy and remote infarct.  CTA shows chronic right ICA occlusion.  Echo is pending.  Continue aspirin for stroke prevention.  Neurology will sign off please call if echo is abnormal.  ADDENDUM: Echo 60-65%. Normal.   Dr. 97 evaluated pt independently, reviewed imaging, chart, labs. Discussed and formulated plan with the APP. Please see APP note above for details.   Total 36 minutes spent on counseling patient and coordinating care, writing notes and reviewing chart.   Tallia Moehring,MD    To contact Stroke Continuity provider, please refer to Viviann Spare. After hours, contact General Neurology

## 2022-04-28 NOTE — Assessment & Plan Note (Signed)
noted 

## 2022-04-28 NOTE — Consult Note (Signed)
[facility] Psychiatry New Face-to-Face Psychiatric Evaluation   Service Date: April 28, 2022 LOS:  LOS: 1 day    Assessment  [name] is a 81 y.o. male admitted medically for 04/27/2022 11:39 AM for AMS. He carries the psychiatric diagnoses of ?remote hx depression and has a past medical history of  asthma, CAD, HTN, heart murmur, hyperlipidimia, MI.Psychiatry was consulted for depression by Dr. Lowell Guitar.    Pt likely was depressed at home based on history and report, however has ongoing s/s of encephalopathy which currently make the risk of starting antidepressants higher than the benefit - in the immediate time frame likely to worsen delirium and would not help for depression for at least 2-4 weeks. Will reassess after further treatment of underlying causes of delirium (primary currently optimizing COPD tx, hydration, nutrition) and likely start an antidepressant (most likely sertraline vs escitalopram vs mirtazapine) at that time. Was not able to get in touch with family although pt assented to son being contacted - son's phone # went to VM.   Diagnoses:  Active Hospital problems: Principal Problem:   Encephalopathy Active Problems:   Acute encephalopathy   Hematuria     Plan  ## Safety and Observation Level:  - Based on my clinical evaluation, I estimate the patient to be at low risk of self harm in the current setting - At this time, we recommend a routine level of observation. This decision is based on my review of the chart including patient's history and current presentation, interview of the patient, mental status examination, and consideration of suicide risk including evaluating suicidal ideation, plan, intent, suicidal or self-harm behaviors, risk factors, and protective factors. This judgment is based on our ability to directly address suicide risk, implement suicide prevention strategies and develop a safety plan while the patient is in the clinical setting. Please  contact our team if there is a concern that risk level has changed.   ## Medications:  -- starting oral thiamine -- will put in olanzapine 2.5 once daily for agitation - getting fairly high dose prednisone.   ## Medical Decision Making Capacity:  Not formally assessed  ## Further Work-up:  -- appreciate thorough w/u done by primary team    -- most recent EKG on 7/1 had QtC of 438 -- Pertinent labwork reviewed earlier this admission includes:  Albumin - low 7/2 - prealbumin also low TSH wnl 7/2 CBC with anemia Ammonia wnl  ## Disposition:  -- per primary  ## Behavioral / Environmental:  -- delirium precautions ordered  ##Legal Status   Thank you for this consult request. Recommendations have been communicated to the primary team.  We will continue to follow at this time.   Sruthi Maurer A Macyn Remmert   New history  Relevant Aspects of Hospital Course:  Admitted on 04/27/2022 for AMS - family reported concerns for depression as wife has been put in a nursing home.  Patient Report:  Patient seen in AM.  He is somewhat surprised that psychiatry has been consulted.  He was more oriented than on prior assessments-new his name and that he was in the hospital.  Thinks he is in the hospital because of a cold and that is 2082, but oriented to season (summer).  Denies any history or knowledge of recent depression but also states that he lives by himself with wife-when prompted states she has been under doctor's care for the past couple of weeks and should be home soon (per chart she was put  in nursing home in October 2022).  States his appetite has been fine recently (has lost significant amount of weight) and also reports good sleep.  He denies current suicidal ideations, homicidal ideations and auditory/visual hallucinations.  Did not formally test attention but patient frequently fell asleep or lost his train of thought and overall attention quite poor through interview.  Patient gave  permission to call his son.  Pt does have guns in the home which will likely need to be removed.   ROS:  Level 5 caveat, AMS  Collateral information:  Called son x2; direct to VM.   Psychiatric History:  Information collected from pt  Patient reports seeing a psychiatrist when he was younger.  States he had to go for work but is unable to stay to for the reason.  Unclear if he understood the question.  He did report a prior history of suicidal ideations that he has "got over"; reportedly did not see a psychiatrist during this time.  Denies any history of psychotropic medications.  Family psych history: unknown   Social History:  Lives at home alone. Wife in nursing home since 10/22. At least 1 daughter and 1 son listed in chart  Tobacco use: reported increase Family History:  The patient's family history includes Hypertension in an other family member.  Medical History: Past Medical History:  Diagnosis Date   Asthma    as a child   Carotid arterial disease (HCC)    a. 11/2015 Carotid U/S: LICA 40-59%, RICA 100 CTO, nl subclavian arteries--f/u 1 yr.   Coronary atherosclerosis of native coronary artery    a. 09/2001 inflat MI/PCI: RCA 1m (3.0x18 AVE S7 BMS); b. 07/2006 Cath: LM nl, LAD 50p, LCX min irregs, OM1 large, min irregs, OM2 min irregs, RCA 20 ISR;  c. 10/2012 MV: EF 74%, no ischemia.   Essential hypertension    Heart murmur    since a child   Hyperlipidemia    Myocardial infarction [facility]) 2002   Pneumonia    age 34   Right inguinal hernia     Surgical History: Past Surgical History:  Procedure Laterality Date   CARDIAC CATHETERIZATION     Carotid stents     COLONOSCOPY     FRACTURE SURGERY     HERNIA REPAIR     ventral   INGUINAL HERNIA REPAIR Right 11/01/2016   Procedure: RIGHT INGUINAL HERNIA REPAIR WITH MESH;  Surgeon: Avel Peace, MD;  Location: [facility] OR;  Service: General;  Laterality: Right;   INSERTION OF MESH Right 11/01/2016   Procedure: INSERTION OF  MESH;  Surgeon: Avel Peace, MD;  Location: [facility] OR;  Service: General;  Laterality: Right;   ORIF SHOULDER FRACTURE Right     Medications:   Current Facility-Administered Medications:    albuterol (PROVENTIL) (2.5 MG/3ML) 0.083% nebulizer solution 2.5 mg, 2.5 mg, Nebulization, Q6H, Zhang, Ping T, MD, 2.5 mg at 04/28/22 1330   albuterol (PROVENTIL) (2.5 MG/3ML) 0.083% nebulizer solution 2.5 mg, 2.5 mg, Nebulization, Q6H PRN, Mikey College T, MD   aspirin EC tablet 81 mg, 81 mg, Oral, Daily, Chipper Herb, Ping T, MD, 81 mg at 04/28/22 1118   bisacodyl (DULCOLAX) EC tablet 5 mg, 5 mg, Oral, Daily PRN, Mikey College T, MD   enoxaparin (LOVENOX) injection 40 mg, 40 mg, Subcutaneous, Q24H, Chipper Herb, Ping T, MD, 40 mg at 04/27/22 2200   ezetimibe (ZETIA) tablet 10 mg, 10 mg, Oral, Daily, Mikey College T, MD, 10 mg at 04/28/22 1118   hydrochlorothiazide (MICROZIDE)  capsule 12.5 mg, 12.5 mg, Oral, Daily, Mikey College T, MD   methylPREDNISolone sodium succinate (SOLU-MEDROL) 40 mg/mL injection 40 mg, 40 mg, Intravenous, Q12H, 40 mg at 04/27/22 1846 **FOLLOWED BY** predniSONE (DELTASONE) tablet 40 mg, 40 mg, Oral, Q breakfast, Zhang, Ilda Foil T, MD   mometasone-formoterol [facility]) 200-5 MCG/ACT inhaler 2 puff, 2 puff, Inhalation, BID, Mikey College T, MD, 2 puff at 04/28/22 9024   nicotine (NICODERM CQ - dosed in mg/24 hours) patch 21 mg, 21 mg, Transdermal, Daily, Mikey College T, MD, 21 mg at 04/28/22 1116   nicotine polacrilex (NICORETTE) gum 2 mg, 2 mg, Oral, PRN, Zigmund Daniel., MD   ondansetron [facility]) tablet 4 mg, 4 mg, Oral, Q6H PRN **OR** ondansetron (ZOFRAN) injection 4 mg, 4 mg, Intravenous, Q6H PRN, Mikey College T, MD   rosuvastatin (CRESTOR) tablet 40 mg, 40 mg, Oral, Daily, Mikey College T, MD, 40 mg at 04/28/22 1118   sodium zirconium cyclosilicate (LOKELMA) packet 10 g, 10 g, Oral, Once, Emeline General, MD  Allergies: Allergies  Allergen Reactions   Penicillins Hives and Swelling    Has patient had a  PCN reaction causing immediate rash, facial/tongue/throat swelling, SOB or lightheadedness with hypotension: Yes Has patient had a PCN reaction causing severe rash involving mucus membranes or skin necrosis: Yes Has patient had a PCN reaction that required hospitalization No Has patient had a PCN reaction occurring within the last 10 years: No If all of the above answers are "NO", then may proceed with Cephalosporin use.        Objective  Vital signs:  Temp:  [97.9 F (36.6 C)-98.4 F (36.9 C)] 98.3 F (36.8 C) (07/02 0716) Pulse Rate:  [47-82] 47 (07/02 0716) Resp:  [13-20] 18 (07/02 0716) BP: (109-138)/(52-107) 121/67 (07/02 0716) SpO2:  [90 %-99 %] 96 % (07/02 0740) Weight:  [55.6 kg] 55.6 kg (07/02 0030)  Psychiatric Specialty Exam:  Presentation  General Appearance: -- (ill appearing) Eye Contact:Poor (frequently falls asleep) Speech:Clear and Coherent Speech Volume:Normal Handedness:No data recorded  Mood and Affect  Mood:-- (States good) Affect:-- (sleepy)  Thought Process  Thought Processes:Coherent (minimal spontaneous thought, short answers to most ?s) Descriptions of Associations:Intact  Orientation:Full (Time, Place and Person)  Thought Content:-- (minimal)  History of Schizophrenia/Schizoaffective disorder:No data recorded Duration of Psychotic Symptoms:No data recorded Hallucinations:Hallucinations: None  Ideas of Reference:None  Suicidal Thoughts:Suicidal Thoughts: No  Homicidal Thoughts:Homicidal Thoughts: No   Sensorium  Memory:Immediate Poor; Recent Poor; Remote Poor  Judgment:Poor  Insight:Fair   Executive Functions  Concentration:Poor  Attention Span:Poor  Recall:Poor  Fund of Knowledge:Poor  Language:Poor   Psychomotor Activity  Psychomotor Activity:Psychomotor Activity: -- (slowed)   Assets  Assets:Leisure Time; Housing; Social Support   Sleep  Sleep:Sleep: Good    Physical Exam: Physical  Exam Constitutional:      Comments: Tired, fatigued, ill appearing   HENT:     Head: Normocephalic.  Pulmonary:     Effort: Pulmonary effort is normal.  Skin:    Comments: Multiple small ecchymoses   Neurological:     Mental Status: He is disoriented.    Blood pressure 121/67, pulse (!) 47, temperature 98.3 F (36.8 C), resp. rate 18, height 5\' 11"  (1.803 m), weight 55.6 kg, SpO2 96 %. Body mass index is 17.09 kg/m.

## 2022-04-28 NOTE — Assessment & Plan Note (Addendum)
Found down on garage floor, unclear how long he'd been there Suspected related to COPD exacerbation/deconditioning

## 2022-04-28 NOTE — Progress Notes (Addendum)
Received pt from ED, pt restless, alert, oriented to name otherwise disoriented times 3. Attempted to restart IV, unsuccessful. Pt trying to get OOB and "go home" reminded pt of current location. Will cont to monitor. SRP, RN

## 2022-04-28 NOTE — Progress Notes (Signed)
Echocardiogram 2D Echocardiogram has been performed.  Warren Lacy Robertta Halfhill RDCS 04/28/2022, 4:00 PM

## 2022-04-28 NOTE — Assessment & Plan Note (Addendum)
Dehydration Improved with IVF

## 2022-04-28 NOTE — Assessment & Plan Note (Signed)
Psychiatry c/s Oral thiamine, olanzapine daily prn agitation

## 2022-04-28 NOTE — Progress Notes (Signed)
PROGRESS NOTE    [name]  [id number] DOB: 10/13/1941 DOA: 04/27/2022 PCP: Janith Lima, MD  Chief Complaint  Patient presents with   Code Stroke    Brief Narrative:  [name] is [name] 81 y.o. male with medical history significant of COPD Gold stage III, chronic hypoxic failure, noncompliant with home O2 and COPD medications, HTN, HLD, CAD status post stenting, cigarette smoke, presented with fall and altered mentations.      Assessment & Plan:   Principal Problem:   Encephalopathy Active Problems:   Acute metabolic encephalopathy   COPD with acute exacerbation ([name])   Fall at home, initial encounter   Stroke-like symptoms   Hematuria   Hyperkalemia   AKI (acute kidney injury) ([name])   Protein-calorie malnutrition, severe ([name])   Essential hypertension   Depression   Tobacco abuse   Hypertrophic toenail   Acute encephalopathy   Assessment and Plan: Acute metabolic encephalopathy Resolved at this point, at admission thought due to COPD exacerbation He's improved now, with treatment Delirium precautions B12, TSH wnl.  VBG without hypercarbia Pending B1  Stroke-like symptoms Code stroke was called on admission, thought to have mild R sided motor deficits, mild ataxia/cognitive/communication deficits  Neurology recommended stroke workup MRI brain without acute abnormality. CTA head/neck with right ICA occluded at bifurcation with reconstitution at level of R posterior communicating artery, less than 50% stenosis of L ICA at bifurcation, moderate stenosis of up to 70% in the R vertebral artery as it enters spinal canal at C6, moderate stenosis of proximal L posterior cerebral artery Appreciate neurology assistance -> TIA vs encephalopathy -> pending consult note today  Fall at home, initial encounter Found down on garage floor, unclear how long he'd been there Suspected related to COPD exacerbation/deconditioning      COPD with acute exacerbation  ([name]) Wheezing noted on admission, improved at this time Continue scheduled and prn nebs Steroids, abx  Hyperkalemia resolved  Hematuria Follow urine cx, needs additional w/u  AKI (acute kidney injury) ([name]) Dehydration Improved with IVF  Protein-calorie malnutrition, severe ([name]) noted  Essential hypertension HCTZ  Depression Psychiatry c/s Oral thiamine, olanzapine daily prn agitation   Tobacco abuse Encourage cessation Nicotine patch  Hypertrophic toenail Needs podiatry follow up     DVT prophylaxis: lovenox Code Status: DNR Family Communication: son Disposition:   Status is: Inpatient Remains inpatient appropriate because: need for therapy eval and safe d/c plan   Consultants:  Psych neurology  Procedures:  Pending echo **  Antimicrobials:  Anti-infectives (From admission, onward)    Start     Dose/Rate Route Frequency Ordered Stop   04/28/22 2200  doxycycline (VIBRA-TABS) tablet 100 mg        100 mg Oral Every 12 hours 04/28/22 1521         Subjective: No complaints  Objective: Vitals:   04/28/22 0030 04/28/22 0430 04/28/22 0716 04/28/22 0740  BP:  (!) 119/52 121/67   Pulse:  62 (!) 47   Resp:  16 18   Temp:  97.9 F (36.6 C) 98.3 F (36.8 C)   TempSrc:  Oral    SpO2:  90% 98% 96%  Weight: 55.6 kg     Height: 5\' 11"  (1.803 m)       Intake/Output Summary (Last 24 hours) at 04/28/2022 1539 Last data filed at 04/28/2022 0549 Gross per 24 hour  Intake 323.63 ml  Output 0 ml  Net 323.63 ml   Filed Weights   04/27/22  1136 04/28/22 0030  Weight: 57.6 kg 55.6 kg    Examination:  General exam: Appears calm and comfortable  Respiratory system: unlabored, no wheezing noted Cardiovascular system: S1 & S2 heard, RRR. Gastrointestinal system: Abdomen is nondistended, soft and nontender. Central nervous system: Alert and oriented. No focal neurological deficits. Extremities: no LEE    Data Reviewed: I have personally reviewed  following labs and imaging studies  CBC: Recent Labs  Lab 04/27/22 1138 04/27/22 1143 04/28/22 0307  WBC  --  5.3 4.9  NEUTROABS  --  4.3  --   HGB 16.0 13.9 11.9*  HCT 47.0 41.9 35.6*  MCV  --  94.2 93.0  PLT  --  242 172    Basic Metabolic Panel: Recent Labs  Lab 04/27/22 1138 04/27/22 1143 04/28/22 0027  NA 134* 137 135  K 5.5* 5.5* 3.8  CL 97* 96* 106  CO2  --  27 19*  GLUCOSE 91 96 129*  BUN 23 15 5*  CREATININE 1.10 1.17 0.65  CALCIUM  --  8.4* 9.1    GFR: Estimated Creatinine Clearance: 56 mL/min (by C-G formula based on SCr of 0.65 mg/dL).  Liver Function Tests: Recent Labs  Lab 04/27/22 1143 04/28/22 0027  AST 41  --   ALT 27  --   ALKPHOS 84  --   BILITOT 1.9*  --   PROT 5.5*  --   ALBUMIN 2.2* 2.8*    CBG: No results for input(s): "GLUCAP" in the last 168 hours.   Recent Results (from the past 240 hour(s))  Resp Panel by RT-PCR (Flu [name]&B, Covid) Anterior Nasal Swab     Status: None   Collection Time: 04/27/22  8:25 PM   Specimen: Anterior Nasal Swab  Result Value Ref Range Status   SARS Coronavirus 2 by RT PCR NEGATIVE NEGATIVE Final    Comment: (NOTE) SARS-CoV-2 target nucleic acids are NOT DETECTED.  The SARS-CoV-2 RNA is generally detectable in upper respiratory specimens during the acute phase of infection. The lowest concentration of SARS-CoV-2 viral copies this assay can detect is 138 copies/mL. [name] negative result does not preclude SARS-Cov-2 infection and should not be used as the sole basis for treatment or other patient management decisions. [name] negative result may occur with  improper specimen collection/handling, submission of specimen other than nasopharyngeal swab, presence of viral mutation(s) within the areas targeted by this assay, and inadequate number of viral copies(<138 copies/mL). [name] negative result must be combined with clinical observations, patient history, and epidemiological information. The expected result is  Negative.  Fact Sheet for Patients:  BloggerCourse.com  Fact Sheet for Healthcare Providers:  SeriousBroker.it  This test is no t yet approved or cleared by the [place] FDA and  has been authorized for detection and/or diagnosis of SARS-CoV-2 by FDA under an Emergency Use Authorization (EUA). This EUA will remain  in effect (meaning this test can be used) for the duration of the COVID-19 declaration under Section 564(b)(1) of the Act, 21 U.S.C.section 360bbb-3(b)(1), unless the authorization is terminated  or revoked sooner.       Influenza Abriana Saltos by PCR NEGATIVE NEGATIVE Final   Influenza B by PCR NEGATIVE NEGATIVE Final    Comment: (NOTE) The Xpert Xpress SARS-CoV-2/FLU/RSV plus assay is intended as an aid in the diagnosis of influenza from Nasopharyngeal swab specimens and should not be used as Flower Franko sole basis for treatment. Nasal washings and aspirates are unacceptable for Xpert Xpress SARS-CoV-2/FLU/RSV testing.  Fact Sheet for Patients:  EntrepreneurPulse.com.au  Fact Sheet for Healthcare Providers: IncredibleEmployment.be  This test is not yet approved or cleared by the [place] FDA and has been authorized for detection and/or diagnosis of SARS-CoV-2 by FDA under an Emergency Use Authorization (EUA). This EUA will remain in effect (meaning this test can be used) for the duration of the COVID-19 declaration under Section 564(b)(1) of the Act, 21 U.S.C. section 360bbb-3(b)(1), unless the authorization is terminated or revoked.  Performed at [facility], [place] [address], [place], [place] [address]          Radiology Studies: MR BRAIN WO CONTRAST  Result Date: 04/27/2022 CLINICAL DATA:  TIA. New onset of leg weakness. Question left parietal infarct. EXAM: MRI HEAD WITHOUT CONTRAST TECHNIQUE: Multiplanar, multiecho pulse sequences of the brain and surrounding structures  were obtained without intravenous contrast. COMPARISON:  CT head without contrast and CTA head and neck 04/27/2022 FINDINGS: Brain: Atrophy and white matter changes are moderately advanced for age. The ventricles are proportionate to the degree of atrophy. No acute infarct, hemorrhage, or mass lesion is present. Remote lacunar infarct is present along the posterior limb of the left external capsule. Basal ganglia are intact. Insular ribbon is normal. The internal auditory canals are within normal limits. The brainstem and cerebellum are within normal limits. Vascular: Flow is present in the major intracranial arteries. Skull and upper cervical spine: The craniocervical junction is normal. Upper cervical spine is within normal limits. Marrow signal is unremarkable. Sinuses/Orbits: Small mastoid effusions are present. No obstructing nasopharyngeal lesion is present. The paranasal sinuses and mastoid air cells are otherwise clear. The globes and orbits are within normal limits. Other: IMPRESSION: 1. No acute intracranial abnormality. 2. Atrophy and white matter disease is moderately advanced for age. This likely reflects the sequela of chronic microvascular ischemia. 3. Remote lacunar infarct of the posterior limb of the left external capsule. Electronically Signed   By: San Morelle M.D.   On: 04/27/2022 18:27   CT ANGIO HEAD NECK W WO CM W PERF (CODE STROKE)  Result Date: 04/27/2022 CLINICAL DATA:  Code stroke. Question left parietal infarction. EXAM: CT ANGIOGRAPHY HEAD AND NECK CT PERFUSION BRAIN TECHNIQUE: Multidetector CT imaging of the head and neck was performed using the standard protocol during bolus administration of intravenous contrast. Multiplanar CT image reconstructions and MIPs were obtained to evaluate the vascular anatomy. Carotid stenosis measurements (when applicable) are obtained utilizing NASCET criteria, using the distal internal carotid diameter as the denominator. Multiphase CT  imaging of the brain was performed following IV bolus contrast injection. Subsequent parametric perfusion maps were calculated using RAPID software. RADIATION DOSE REDUCTION: This exam was performed according to the departmental dose-optimization program which includes automated exposure control, adjustment of the mA and/or kV according to patient size and/or use of iterative reconstruction technique. CONTRAST:  152mL OMNIPAQUE IOHEXOL 350 MG/ML SOLN COMPARISON:  CT head without contrast 04/27/2022. FINDINGS: CTA NECK FINDINGS Aortic arch: Atherosclerotic calcifications are present at the aortic arch and great vessel origins without focal aneurysm or stenosis. Right carotid system: Atherosclerotic calcifications are present in the distal right common carotid artery. The right internal carotid artery is occluded. No reconstitution in the neck. Left carotid system: The left common carotid artery demonstrates some distal calcification. Dense calcifications are present at the bifurcation. The lumen is narrowed to 2.3 mm, less than 50% stenosis relative to the more normal distal left ICA. Vertebral arteries: The vertebral arteries are codominant. Both vertebral arteries originate from the subclavian arteries  without significant stenosis. Moderate narrowing of up to 50% is present in the right vertebral artery as it enters the spinal canal at C6. No other significant focal stenosis is present in either vertebral artery in the neck. Skeleton: Degenerative changes are present in the cervical spine with grade 1 anterolisthesis at C3-4 and C4-5. Marked endplate changes are present at C5-6 and C6-7. No focal osseous lesions are present. The patient is edentulous. Other neck: Diffuse subcutaneous edema is present in the neck. No discrete lesion or abscess is present. No significant adenopathy is present. The submandibular and parotid glands and ducts are within normal limits. No focal mucosal lesions are present. The thyroid is  normal. Upper chest: Centrilobular emphysematous changes are present. Scarring is present at both lung apices. Thoracic inlet is within normal limits. Review of the MIP images confirms the above findings CTA HEAD FINDINGS Anterior circulation: The right internal carotid artery is reconstituted at the level of the posterior communicating artery. Atherosclerotic calcifications are present within the cavernous left ICA without Phil Michels significant stenosis relative to the ICA terminus. The left A1 segment is dominant. The anterior communicating artery is patent. The MCA scratched at the M1 segments are normal bilaterally. MCA bifurcations are within normal limits. The ACA and MCA branch vessels are within normal limits. Posterior circulation: Moderate stenosis is present at the dural margin of the right vertebral artery. The intracranial left vertebral artery is the dominant vessel. PICA origins are visualized and normal. Vertebrobasilar junction is normal. Basilar artery is normal. Both posterior cerebral arteries originate from basilar tip. Moderate stenosis is present in the proximal left posterior cerebral artery. Pial collateral vessels contribute. Venous sinuses: The dural sinuses are patent. The straight sinus deep cerebral veins are intact. Cortical veins are within normal limits. No significant vascular malformation is evident. Anatomic variants: Prominent right posterior communicating artery reconstituting the right ICA. Review of the MIP images confirms the above findings CT Brain Perfusion Findings: ASPECTS: 10/10 CBF (<30%) Volume: 9mL Perfusion (Tmax>6.0s) volume: 77mL Mismatch Volume: 63mL Infarction Location:N/Tamzin Bertling IMPRESSION: 1. CT perfusion demonstrates no acute infarct or significant area of ischemia. 2. 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 3. Less than 50% stenosis of the left internal carotid artery at the  bifurcation. 4. Moderate stenosis of up to 70% in the right vertebral artery as it enters the spinal canal at C6. 5. Moderate stenosis of the proximal left posterior cerebral artery with pial collaterals filling the territory. 6. Aortic Atherosclerosis (ICD10-I70.0) and Emphysema (ICD10-J43.9). Electronically Signed   By: San Morelle M.D.   On: 04/27/2022 12:36   CT HEAD CODE STROKE WO CONTRAST`  Result Date: 04/27/2022 CLINICAL DATA:  Code stroke.  New onset of leg weakness. Fell. EXAM: CT HEAD WITHOUT CONTRAST TECHNIQUE: Contiguous axial images were obtained from the base of the skull through the vertex without intravenous contrast. RADIATION DOSE REDUCTION: This exam was performed according to the departmental dose-optimization program which includes automated exposure control, adjustment of the mA and/or kV according to patient size and/or use of iterative reconstruction technique. COMPARISON:  None Available. FINDINGS: Brain: No acute infarct, hemorrhage, or mass lesion is present. Moderate atrophy and white matter changes are present bilaterally. Basal ganglia are intact. Insular ribbon is normal. No acute or focal cortical abnormality is present. The ventricles are proportionate to the degree of atrophy. No significant extraaxial fluid collection is present. The brainstem and cerebellum are within  normal limits. Vascular: Atherosclerotic calcifications are present within the cavernous internal carotid arteries. Hyperdense vessel is present. Skull: Calvarium is intact. No focal lytic or blastic lesions are present. No significant extracranial soft tissue lesion is present. Sinuses/Orbits: Shrunken maxillary sinuses are present bilaterally. No acute sinus disease is present. The globes and orbits are within normal limits. ASPECTS [facility] Stroke Program Early CT Score) - Ganglionic level infarction (caudate, lentiform nuclei, internal capsule, insula, M1-M3 cortex): 7/7 - Supraganglionic infarction  (M4-M6 cortex): 3/3 Total score (0-10 with 10 being normal): 10/10 IMPRESSION: 1. No acute intracranial abnormality. 2. Moderate atrophy and white matter disease likely reflects the sequela of chronic microvascular ischemia. 3. ASPECTS is 10/10. * Electronically Signed   By: Marin Roberts M.D.   On: 04/27/2022 12:03        Scheduled Meds:  albuterol  2.5 mg Nebulization Q6H   aspirin EC  81 mg Oral Daily   doxycycline  100 mg Oral Q12H   enoxaparin (LOVENOX) injection  40 mg Subcutaneous Q24H   ezetimibe  10 mg Oral Daily   hydrochlorothiazide  12.5 mg Oral Daily   methylPREDNISolone (SOLU-MEDROL) injection  40 mg Intravenous Q12H   Followed by   predniSONE  40 mg Oral Q breakfast   mometasone-formoterol  2 puff Inhalation BID   nicotine  21 mg Transdermal Daily   rosuvastatin  40 mg Oral Daily   sodium zirconium cyclosilicate  10 g Oral Once   Continuous Infusions:   LOS: 1 day    Time spent: over 30 min     Lacretia Nicks, MD [facility]   To contact the attending provider between 7A-7P or the covering provider during after hours 7P-7A, please log into the web site [email or web address] and access using universal [place] password for that web site. If you do not have the password, please call the hospital operator.  04/28/2022, 3:39 PM

## 2022-04-29 DIAGNOSIS — I442 Atrioventricular block, complete: Secondary | ICD-10-CM | POA: Insufficient documentation

## 2022-04-29 DIAGNOSIS — R001 Bradycardia, unspecified: Secondary | ICD-10-CM

## 2022-04-29 DIAGNOSIS — G9340 Encephalopathy, unspecified: Secondary | ICD-10-CM | POA: Diagnosis not present

## 2022-04-29 LAB — GLUCOSE, CAPILLARY: Glucose-Capillary: 91 mg/dL (ref 70–99)

## 2022-04-29 LAB — COMPREHENSIVE METABOLIC PANEL
ALT: 18 U/L (ref 0–44)
AST: 41 U/L (ref 15–41)
Albumin: 2 g/dL — ABNORMAL LOW (ref 3.5–5.0)
Alkaline Phosphatase: 70 U/L (ref 38–126)
Anion gap: 9 (ref 5–15)
BUN: 23 mg/dL (ref 8–23)
CO2: 31 mmol/L (ref 22–32)
Calcium: 8.4 mg/dL — ABNORMAL LOW (ref 8.9–10.3)
Chloride: 98 mmol/L (ref 98–111)
Creatinine, Ser: 1.13 mg/dL (ref 0.61–1.24)
GFR, Estimated: >60 mL/min (ref >60)
Glucose, Bld: 150 mg/dL — ABNORMAL HIGH (ref 70–99)
Potassium: 2.8 mmol/L — ABNORMAL LOW (ref 3.5–5.1)
Sodium: 138 mmol/L (ref 135–145)
Total Bilirubin: 0.4 mg/dL (ref 0.3–1.2)
Total Protein: 4.5 g/dL — ABNORMAL LOW (ref 6.5–8.1)

## 2022-04-29 LAB — CBC WITH DIFFERENTIAL/PLATELET
Abs Immature Granulocytes: 0.04 10*3/uL (ref 0.00–0.07)
Basophils Absolute: 0 10*3/uL (ref 0.0–0.1)
Basophils Relative: 0 %
Eosinophils Absolute: 0 10*3/uL (ref 0.0–0.5)
Eosinophils Relative: 0 %
HCT: 34.3 % — ABNORMAL LOW (ref 39.0–52.0)
Hemoglobin: 11.3 g/dL — ABNORMAL LOW (ref 13.0–17.0)
Immature Granulocytes: 1 %
Lymphocytes Relative: 16 %
Lymphs Abs: 1.3 10*3/uL (ref 0.7–4.0)
MCH: 31 pg (ref 26.0–34.0)
MCHC: 32.9 g/dL (ref 30.0–36.0)
MCV: 94.2 fL (ref 80.0–100.0)
Monocytes Absolute: 0.7 10*3/uL (ref 0.1–1.0)
Monocytes Relative: 9 %
Neutro Abs: 5.7 10*3/uL (ref 1.7–7.7)
Neutrophils Relative %: 74 %
Platelets: 220 10*3/uL (ref 150–400)
RBC: 3.64 MIL/uL — ABNORMAL LOW (ref 4.22–5.81)
RDW: 16.3 % — ABNORMAL HIGH (ref 11.5–15.5)
WBC: 7.8 10*3/uL (ref 4.0–10.5)
nRBC: 0 % (ref 0.0–0.2)

## 2022-04-29 LAB — PHOSPHORUS: Phosphorus: 1.8 mg/dL — ABNORMAL LOW (ref 2.5–4.6)

## 2022-04-29 LAB — MAGNESIUM: Magnesium: 2 mg/dL (ref 1.7–2.4)

## 2022-04-29 MED ORDER — K PHOS MONO-SOD PHOS DI & MONO 155-852-130 MG PO TABS
500.0000 mg | ORAL_TABLET | Freq: Three times a day (TID) | ORAL | Status: AC
  Administered 2022-04-29 (×4): 500 mg via ORAL
  Filled 2022-04-29 (×4): qty 2

## 2022-04-29 MED ORDER — ENSURE ENLIVE PO LIQD
237.0000 mL | Freq: Two times a day (BID) | ORAL | Status: DC
  Administered 2022-04-30 – 2022-05-01 (×3): 237 mL via ORAL

## 2022-04-29 MED ORDER — THIAMINE HCL 100 MG PO TABS
100.0000 mg | ORAL_TABLET | Freq: Every day | ORAL | Status: DC
Start: 2022-04-29 — End: 2022-05-03
  Administered 2022-04-29 – 2022-05-02 (×4): 100 mg via ORAL
  Filled 2022-04-29 (×5): qty 1

## 2022-04-29 MED ORDER — SODIUM CHLORIDE 0.9% FLUSH
3.0000 mL | Freq: Two times a day (BID) | INTRAVENOUS | Status: DC
  Administered 2022-04-29 – 2022-05-02 (×6): 3 mL via INTRAVENOUS

## 2022-04-29 MED ORDER — ADULT MULTIVITAMIN W/MINERALS CH
1.0000 | ORAL_TABLET | Freq: Every day | ORAL | Status: DC
  Administered 2022-04-29 – 2022-05-02 (×4): 1 via ORAL
  Filled 2022-04-29 (×5): qty 1

## 2022-04-29 MED ORDER — POTASSIUM CHLORIDE CRYS ER 20 MEQ PO TBCR
40.0000 meq | EXTENDED_RELEASE_TABLET | ORAL | Status: AC
  Administered 2022-04-29 (×2): 40 meq via ORAL
  Filled 2022-04-29 (×2): qty 2

## 2022-04-29 NOTE — Progress Notes (Signed)
Pt has difficulty ambulating to the restroom in a timely manner. He also needs the height of the 3 in 1 to assist with getting up and down on the toilet.

## 2022-04-29 NOTE — Consult Note (Signed)
Cardiology Consultation:   Patient ID: [name] MRN: [id number]; DOB: October 03, 1941  Admit date: 04/27/2022 Date of Consult: 04/29/2022  PCP:  Janith Lima, MD   [facility] Providers Cardiologist:  Kirk Ruths, MD   {   Patient Profile:   [name] is a 81 y.o. male with a hx of PVD (known occl R ICA), CAD,  CAD (non-obstructive by cath 2007 with hx of prior PCI to RCA), HTN, HLD, COPD, who is being seen 04/29/2022 for the evaluation of an episode of CHB w/7 second V pause at the request of Dr. Harrington Challenger.  History of Present Illness:   [name] was admitted 04/27/22 seems after a fall w/some AMS, apparently found on the garage floor by family awake, but confused. Family reported depressed since his wife had to transition to a NH,m started smoking again., reduce appetite and PO intake with associated weight loss.  Mechanism of his fall is unknown, he has been firm that he tripped and did not faint. Initially felt to be metabolic encephalopathy, [id number] severe dehydration,  malnutrition, COPD exacerbation  Psych was also brought on board  Cardiology called today for a 7 second pause on telemetry, with no nodal blocking agents EP was asked to see  LABS K+ 5.5 > 5.5 > 3.8 > 2.8 (without a reversal agent given, now is being replaced) Mag 2.0 BUN/Creat 23/1.1 > 0.65 > 1.13 CK 838 > 326 WBC 7.8 H/H 11/34 Plts 220 TSH 0/878    Past Medical History:  Diagnosis Date   Asthma    as a child   Carotid arterial disease ([place])    a. [id number] Carotid U/S: LICA 123456, RICA 123XX123 CTO, nl subclavian arteries--f/u 1 yr.   Coronary atherosclerosis of native coronary artery    a. 09/2001 inflat MI/PCI: RCA 56m (3.0[address] S7 BMS); b. 07/2006 Cath: LM nl, LAD 50p, LCX min irregs, OM1 large, min irregs, OM2 min irregs, RCA 20 ISR;  c. 10/2012 MV: EF 74%, no ischemia.   Essential hypertension    Heart murmur    since a child   Hyperlipidemia    Myocardial infarction [facility]) 2002   Pneumonia     age 65   Right inguinal hernia     Past Surgical History:  Procedure Laterality Date   CARDIAC CATHETERIZATION     Carotid stents     COLONOSCOPY     FRACTURE SURGERY     HERNIA REPAIR     ventral   INGUINAL HERNIA REPAIR Right 11/01/2016   Procedure: RIGHT INGUINAL HERNIA REPAIR WITH MESH;  Surgeon: Jackolyn Confer, MD;  Location: [place];  Service: General;  Laterality: Right;   INSERTION OF MESH Right 11/01/2016   Procedure: INSERTION OF MESH;  Surgeon: Jackolyn Confer, MD;  Location: [place];  Service: General;  Laterality: Right;   ORIF SHOULDER FRACTURE Right      Home Medications:  Prior to Admission medications   Medication Sig Start Date End Date Taking? Authorizing Provider  albuterol (PROVENTIL) (2.5 MG/3ML) 0.083% nebulizer solution INHALE THE CONTENTS OF 1 VIAL INTO THE LUNGS VIA NEBULIZER EVERY 6 HOURS AS NEEDED FOR WHEEZING OR SHORTNESS OF BREATH Patient taking differently: Take 2.5 mg by nebulization every 6 (six) hours as needed for wheezing or shortness of breath. INHALE THE CONTENTS OF 1 VIAL INTO THE LUNGS VIA NEBULIZER EVERY 6 HOURS AS NEEDED FOR WHEEZING OR SHORTNESS OF BREATH 02/11/22  Yes Janith Lima, MD  albuterol (VENTOLIN HFA) 108 (90  Base) MCG/ACT inhaler Inhale 2 puffs into the lungs every 6 (six) hours as needed for wheezing or shortness of breath. 10/04/21  Yes Michela Pitcher, NP  aspirin EC 81 MG EC tablet Take 1 tablet (81 mg total) by mouth daily. Patient not taking: Reported on 04/28/2022 01/25/17   Regalado, Jerald Kief A, MD  ezetimibe (ZETIA) 10 MG tablet TAKE 1 TABLET(10 MG) BY MOUTH DAILY. Schedule an appointment with cardiology for future refills, 2nd attempt Patient not taking: Reported on 04/28/2022 12/06/21   Lelon Perla, MD  hydrochlorothiazide (MICROZIDE) 12.5 MG capsule TAKE 1 CAPSULE BY MOUTH DAILY Patient not taking: Reported on 04/28/2022 05/16/21   Lelon Perla, MD  rosuvastatin (CRESTOR) 40 MG tablet PATIENT MUST SCHEDULE APPOINTMENT FOR  FUTURE REFILLS. LAST ATTEMPT. Patient not taking: Reported on 04/28/2022 01/14/22   Lelon Perla, MD    Inpatient Medications: Scheduled Meds:  albuterol  2.5 mg Nebulization TID   aspirin EC  81 mg Oral Daily   doxycycline  100 mg Oral Q12H   enoxaparin (LOVENOX) injection  40 mg Subcutaneous Q24H   ezetimibe  10 mg Oral Daily   hydrocerin   Topical BID   hydrochlorothiazide  12.5 mg Oral Daily   mometasone-formoterol  2 puff Inhalation BID   nicotine  21 mg Transdermal Daily   phosphorus  500 mg Oral TID AC & HS   predniSONE  40 mg Oral Q breakfast   rosuvastatin  40 mg Oral Daily   Continuous Infusions:  PRN Meds: albuterol, bisacodyl, nicotine polacrilex, OLANZapine **OR** OLANZapine, ondansetron **OR** ondansetron (ZOFRAN) IV  Allergies:    Allergies  Allergen Reactions   Penicillins Hives and Swelling    Has patient had a PCN reaction causing immediate rash, facial/tongue/throat swelling, SOB or lightheadedness with hypotension: Yes Has patient had a PCN reaction causing severe rash involving mucus membranes or skin necrosis: Yes Has patient had a PCN reaction that required hospitalization No Has patient had a PCN reaction occurring within the last 10 years: No If all of the above answers are "NO", then may proceed with Cephalosporin use.     Social History:   Social History   Socioeconomic History   Marital status: Married    Spouse name: Not on file   Number of children: Not on file   Years of education: Not on file   Highest education level: Not on file  Occupational History    Employer: RETIRED  Tobacco Use   Smoking status: Former    Packs/day: 1.00    Years: 44.00    Total pack years: 44.00    Types: Cigarettes    Quit date: 10/28/1998    Years since quitting: 23.5   Smokeless tobacco: Former    Types: Chew   Tobacco comments:    quit chewing tobacco 2001 ish  Substance and Sexual Activity   Alcohol use: No    Alcohol/week: 0.0 standard drinks  of alcohol   Drug use: No   Sexual activity: Not on file  Other Topics Concern   Not on file  Social History Narrative   Lives in [place] with his wife.   Social Determinants of Health   Financial Resource Strain: Not on file  Food Insecurity: Not on file  Transportation Needs: Not on file  Physical Activity: Not on file  Stress: Not on file  Social Connections: Not on file  Intimate Partner Violence: Not on file    Family History:   Family History  Problem Relation Age of Onset   Hypertension Other      ROS:  Please see the history of present illness.  All other ROS reviewed and negative.     Physical Exam/Data:   Vitals:   04/29/22 0807 04/29/22 1205 04/29/22 1407 04/29/22 1500  BP: 118/64 119/68    Pulse: 79 65    Resp: 18 18    Temp: 97.8 F (36.6 C) 98 F (36.7 C)    TempSrc: Oral Oral    SpO2: 93% 100% 100%   Weight:      Height:    5\' 9"  (1.753 m)   No intake or output data in the 24 hours ending 04/29/22 1530    04/28/2022   12:30 AM 04/27/2022   11:36 AM 11/06/2021    2:51 PM  Last 3 Weights  Weight (lbs) 122 lb 8 oz 126 lb 15.8 oz 141 lb 12.8 oz  Weight (kg) 55.566 kg 57.6 kg 64.32 kg     Body mass index is 18.09 kg/m.  General:  Well nourished, well developed, in no acute distress, appears chronically ill HEENT: normal Neck: no JVD Vascular: No carotid bruits; Distal pulses 2+ bilaterally Cardiac:  RRR; no murmurs, gallops or rubs Lungs:  CTA b/l, no wheezing, rhonchi or rales  Abd: soft, nontender  Ext: no edema Musculoskeletal:  No deformities Skin: warm and dry  Neuro:  no focal abnormalities noted Psych:  Normal affect   EKG:  The EKG was personally reviewed and demonstrates:   SR 75bpm, LAD, IVCD 01/04/2022 SR 64bpm, unchanged  Telemetry:  Telemetry was personally reviewed and demonstrates:   SR rates 60's-70's, last evening developed PVCs today markedly increased in frequency today with a few NSVTs as well that are  polymorphic. One episode of CHB with a 7 second V pause is noted.   Relevant CV Studies:  04/28/22; TTE 1. Left ventricular ejection fraction, by estimation, is 60 to 65%. The  left ventricle has normal function. The left ventricle has no regional  wall motion abnormalities. There is mild left ventricular hypertrophy.  Left ventricular diastolic parameters  are consistent with Grade II diastolic dysfunction (pseudonormalization).   2. Right ventricular systolic function is normal. The right ventricular  size is normal.   3. The mitral valve is normal in structure. Mild mitral valve  regurgitation. No evidence of mitral stenosis. Moderate mitral annular  calcification.   4. The aortic valve is tricuspid. Aortic valve regurgitation is not  visualized. Aortic valve sclerosis is present, with no evidence of aortic  valve stenosis.   5. The inferior vena cava is normal in size with greater than 50%  respiratory variability, suggesting right atrial pressure of 3 mmHg.   Laboratory Data:  High Sensitivity Troponin:  No results for input(s): "TROPONINIHS" in the last 720 hours.   Chemistry Recent Labs  Lab 04/27/22 1143 04/28/22 0027 04/29/22 0227  NA 137 135 138  K 5.5* 3.8 2.8*  CL 96* 106 98  CO2 27 19* 31  GLUCOSE 96 129* 150*  BUN 15 5* 23  CREATININE 1.17 0.65 1.13  CALCIUM 8.4* 9.1 8.4*  MG  --   --  2.0  GFRNONAA >60 >60 >60  ANIONGAP 14 10 9     Recent Labs  Lab 04/27/22 1143 04/28/22 0027 04/29/22 0227  PROT 5.5*  --  4.5*  ALBUMIN 2.2* 2.8* 2.0*  AST 41  --  41  ALT 27  --  18  ALKPHOS 84  --  70  BILITOT 1.9*  --  0.4   Lipids No results for input(s): "CHOL", "TRIG", "HDL", "LABVLDL", "LDLCALC", "CHOLHDL" in the last 168 hours.  Hematology Recent Labs  Lab 04/27/22 1143 04/28/22 0307 04/29/22 0227  WBC 5.3 4.9 7.8  RBC 4.45 3.83* 3.64*  HGB 13.9 11.9* 11.3*  HCT 41.9 35.6* 34.3*  MCV 94.2 93.0 94.2  MCH 31.2 31.1 31.0  MCHC 33.2 33.4 32.9  RDW  16.2* 16.0* 16.3*  PLT 242 172 220   Thyroid  Recent Labs  Lab 04/28/22 0307  TSH 0.878    BNPNo results for input(s): "BNP", "PROBNP" in the last 168 hours.  DDimer No results for input(s): "DDIMER" in the last 168 hours.   Radiology/Studies:   MR BRAIN WO CONTRAST Result Date: 04/27/2022 CLINICAL DATA:  TIA. New onset of leg weakness. Question left parietal infarct. EXAM: MRI HEAD WITHOUT CONTRAST TECHNIQUE: Multiplanar, multiecho pulse sequences of the brain and surrounding structures were obtained without intravenous contrast. COMPARISON:  CT head without contrast and CTA head and neck 04/27/2022 FINDINGS: Brain: Atrophy and white matter changes are moderately advanced for age. The ventricles are proportionate to the degree of atrophy. No acute infarct, hemorrhage, or mass lesion is present. Remote lacunar infarct is present along the posterior limb of the left external capsule. Basal ganglia are intact. Insular ribbon is normal. The internal auditory canals are within normal limits. The brainstem and cerebellum are within normal limits. Vascular: Flow is present in the major intracranial arteries. Skull and upper cervical spine: The craniocervical junction is normal. Upper cervical spine is within normal limits. Marrow signal is unremarkable. Sinuses/Orbits: Small mastoid effusions are present. No obstructing nasopharyngeal lesion is present. The paranasal sinuses and mastoid air cells are otherwise clear. The globes and orbits are within normal limits. Other: IMPRESSION: 1. No acute intracranial abnormality. 2. Atrophy and white matter disease is moderately advanced for age. This likely reflects the sequela of chronic microvascular ischemia. 3. Remote lacunar infarct of the posterior limb of the left external capsule. Electronically Signed   By: Marin Roberts M.D.   On: 04/27/2022 18:27   CT ANGIO HEAD NECK W WO CM W PERF (CODE STROKE) Result Date: 04/27/2022 CLINICAL DATA:  Code  stroke. Question left parietal infarction. EXAM: CT ANGIOGRAPHY HEAD AND NECK CT PERFUSION BRAIN TECHNIQUE: Multidetector CT imaging of the head and neck was performed using the standard protocol during bolus administration of intravenous contrast. Multiplanar CT image reconstructions and MIPs were obtained to evaluate the vascular anatomy. Carotid stenosis measurements (when applicable) are obtained utilizing NASCET criteria, using the distal internal carotid diameter as the denominator. Multiphase CT imaging of the brain was performed following IV bolus contrast injection. Subsequent parametric perfusion maps were calculated using RAPID software. RADIATION DOSE REDUCTION: This exam was performed according to the departmental dose-optimization program which includes automated exposure control, adjustment of the mA and/or kV according to patient size and/or use of iterative reconstruction technique. CONTRAST:  OMNIPAQUE IOHEXOL 350 MG/ML SOLN COMPARISON:  CT head without contrast 04/27/2022. FINDINGS: CTA NECK FINDINGS Aortic arch: Atherosclerotic calcifications are present at the aortic arch and great vessel origins without focal aneurysm or stenosis. Right carotid system: Atherosclerotic calcifications are present in the distal right common carotid artery. The right internal carotid artery is occluded. No reconstitution in the neck. Left carotid system: The left common carotid artery demonstrates some distal calcification. Dense calcifications are present at the bifurcation. The lumen is narrowed to 2.3 mm, less than 50%  stenosis relative to the more normal distal left ICA. Vertebral arteries: The vertebral arteries are codominant. Both vertebral arteries originate from the subclavian arteries without significant stenosis. Moderate narrowing of up to 50% is present in the right vertebral artery as it enters the spinal canal at C6. No other significant focal stenosis is present in either vertebral artery in  the neck. Skeleton: Degenerative changes are present in the cervical spine with grade 1 anterolisthesis at C3-4 and C4-5. Marked endplate changes are present at C5-6 and C6-7. No focal osseous lesions are present. The patient is edentulous. Other neck: Diffuse subcutaneous edema is present in the neck. No discrete lesion or abscess is present. No significant adenopathy is present. The submandibular and parotid glands and ducts are within normal limits. No focal mucosal lesions are present. The thyroid is normal. Upper chest: Centrilobular emphysematous changes are present. Scarring is present at both lung apices. Thoracic inlet is within normal limits. Review of the MIP images confirms the above findings CTA HEAD FINDINGS Anterior circulation: The right internal carotid artery is reconstituted at the level of the posterior communicating artery. Atherosclerotic calcifications are present within the cavernous left ICA without a significant stenosis relative to the ICA terminus. The left A1 segment is dominant. The anterior communicating artery is patent. The MCA scratched at the M1 segments are normal bilaterally. MCA bifurcations are within normal limits. The ACA and MCA branch vessels are within normal limits. Posterior circulation: Moderate stenosis is present at the dural margin of the right vertebral artery. The intracranial left vertebral artery is the dominant vessel. PICA origins are visualized and normal. Vertebrobasilar junction is normal. Basilar artery is normal. Both posterior cerebral arteries originate from basilar tip. Moderate stenosis is present in the proximal left posterior cerebral artery. Pial collateral vessels contribute. Venous sinuses: The dural sinuses are patent. The straight sinus deep cerebral veins are intact. Cortical veins are within normal limits. No significant vascular malformation is evident. Anatomic variants: Prominent right posterior communicating artery reconstituting the right  ICA. Review of the MIP images confirms the above findings CT Brain Perfusion Findings: ASPECTS: 10/10 CBF (<30%) Volume: 56mL Perfusion (Tmax>6.0s) volume: 87mL Mismatch Volume: 38mL Infarction Location:N/A IMPRESSION: 1. CT perfusion demonstrates no acute infarct or significant area of ischemia. 2. 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 3. Less than 50% stenosis of the left internal carotid artery at the bifurcation. 4. Moderate stenosis of up to 70% in the right vertebral artery as it enters the spinal canal at C6. 5. Moderate stenosis of the proximal left posterior cerebral artery with pial collaterals filling the territory. 6. Aortic Atherosclerosis (ICD10-I70.0) and Emphysema (ICD10-J43.9). Electronically Signed   By: San Morelle M.D.   On: 04/27/2022 12:36   CT HEAD CODE STROKE WO CONTRAST` Result Date: 04/27/2022 CLINICAL DATA:  Code stroke.  New onset of leg weakness. Fell. EXAM: CT HEAD WITHOUT CONTRAST TECHNIQUE: Contiguous axial images were obtained from the base of the skull through the vertex without intravenous contrast. RADIATION DOSE REDUCTION: This exam was performed according to the departmental dose-optimization program which includes automated exposure control, adjustment of the mA and/or kV according to patient size and/or use of iterative reconstruction technique. COMPARISON:  None Available. FINDINGS: Brain: No acute infarct, hemorrhage, or mass lesion is present. Moderate atrophy and white matter changes are present bilaterally. Basal ganglia are intact. Insular ribbon is normal. No acute or focal cortical abnormality is  present. The ventricles are proportionate to the degree of atrophy. No significant extraaxial fluid collection is present. The brainstem and cerebellum are within normal limits. Vascular: Atherosclerotic calcifications are present within the cavernous internal  carotid arteries. Hyperdense vessel is present. Skull: Calvarium is intact. No focal lytic or blastic lesions are present. No significant extracranial soft tissue lesion is present. Sinuses/Orbits: Shrunken maxillary sinuses are present bilaterally. No acute sinus disease is present. The globes and orbits are within normal limits. ASPECTS [facility] Stroke Program Early CT Score) - Ganglionic level infarction (caudate, lentiform nuclei, internal capsule, insula, M1-M3 cortex): 7/7 - Supraganglionic infarction (M4-M6 cortex): 3/3 Total score (0-10 with 10 being normal): 10/10 IMPRESSION: 1. No acute intracranial abnormality. 2. Moderate atrophy and white matter disease likely reflects the sequela of chronic microvascular ischemia. 3. ASPECTS is 10/10. * Electronically Signed   By: San Morelle M.D.   On: 04/27/2022 12:03     Assessment and Plan:   Falls/syncope Very unclear, pt insists he is falling/tripping, denies syncope but has fallen more then once in a few months it seems Family by record found him on the garage floor, awake and confused  CHB No nodal blocking agents Significant hypokalemia, not felt to explain his CHB Will need pacing Probably Thursday C/w electrolyte correction and management of his other medical problems with medicine  team  PVCs, NSVT Most likely 2/2 low potassium Replacement is being addressed by the medicine team  4. AMS AAO x4 currently He denies syncope but can not tell us exactly how he fell Sounds like he has had in general increased depression, and functional decline at home since his wife transitioned to a SNF   Risk Assessment/Risk Scores:    For questions or updates, please contact [place] Please consult [email or web address] for contact info under    Signed, Baldwin Jamaica, PA-C  04/29/2022 3:30 PM

## 2022-04-29 NOTE — Progress Notes (Signed)
Occupational Therapy Treatment Patient Details Name: [name] MRN: [id number] DOB: 02-18-1941 Today's Date: 04/29/2022   History of present illness Pt is an 81 y/o male who presents 04/27/22 after being found down on the garage floor, awake and confused. MRI negative for acute changes. PMH significant for asthma, CAD, essential HTN, heart murmur, MI, R inguinal hernia s/p repair 11/01/2016, ventral hernia repair, ORIF shoulder fracture, multiple falls.   OT comments  Pt progressing towards goals this session, completing standing and UB dressing and standing grooming task at sink with min guard-min A. Pt needing increased cuing to keep RW close to body, attempts to abandon RW toward end of session. Pt presenting with impairments listed below, will follow acutely. Continue to recommend HHOT at d/c.   Recommendations for follow up therapy are one component of a multi-disciplinary discharge planning process, led by the attending physician.  Recommendations may be updated based on patient status, additional functional criteria and insurance authorization.    Follow Up Recommendations  Home health OT    Assistance Recommended at Discharge Frequent or constant Supervision/Assistance  Patient can return home with the following  A little help with walking and/or transfers;A lot of help with bathing/dressing/bathroom;Assistance with cooking/housework;Direct supervision/assist for medications management;Direct supervision/assist for financial management;Assist for transportation;Help with stairs or ramp for entrance   Equipment Recommendations  BSC/3in1    Recommendations for Other Services      Precautions / Restrictions Precautions Precautions: Fall Restrictions Weight Bearing Restrictions: No       Mobility Bed Mobility               General bed mobility comments: OOB in chair upon arrival    Transfers Overall transfer level: Needs assistance Equipment used: Rolling walker (2  wheels) Transfers: Sit to/from Stand Sit to Stand: Min assist           General transfer comment: cues to keep RW close to body/not abandon RW during session     Balance Overall balance assessment: Needs assistance Sitting-balance support: Feet supported, No upper extremity supported Sitting balance-Leahy Scale: Poor   Postural control: Right lateral lean, Posterior lean Standing balance support: Bilateral upper extremity supported, During functional activity, Reliant on assistive device for balance Standing balance-Leahy Scale: Poor                             ADL either performed or assessed with clinical judgement   ADL Overall ADL's : Needs assistance/impaired     Grooming: Min guard;Wash/dry face;Standing Grooming Details (indicate cue type and reason): to wash face         Upper Body Dressing : Minimal assistance;Sitting Upper Body Dressing Details (indicate cue type and reason): to don gown     Toilet Transfer: Minimal assistance;Rolling walker (2 wheels);Ambulation           Functional mobility during ADLs: Minimal assistance;Rolling walker (2 wheels)      Extremity/Trunk Assessment Upper Extremity Assessment Upper Extremity Assessment: Generalized weakness RUE Deficits / Details: ~25% shoulder flexion, weak grasp, son reports hx of shoulder injury RUE Coordination: decreased fine motor;decreased gross motor   Lower Extremity Assessment Lower Extremity Assessment: Defer to PT evaluation        Vision   Vision Assessment?: No apparent visual deficits   Perception Perception Perception: Not tested   Praxis Praxis Praxis: Not tested    Cognition Arousal/Alertness: Awake/alert Behavior During Therapy: Flat affect Overall Cognitive Status: Impaired/Different from  baseline                     Current Attention Level: Sustained Memory: Decreased short-term memory Following Commands: Follows one step commands consistently,  Follows one step commands with increased time, Follows multi-step commands inconsistently Safety/Judgement: Decreased awareness of safety, Decreased awareness of deficits Awareness: Intellectual Problem Solving: Slow processing, Difficulty sequencing, Decreased initiation, Requires verbal cues          Exercises      Shoulder Instructions       General Comments      Pertinent Vitals/ Pain       Pain Assessment Pain Assessment: No/denies pain  Home Living                                          Prior Functioning/Environment              Frequency  Min 2X/week        Progress Toward Goals  OT Goals(current goals can now be found in the care plan section)  Progress towards OT goals: Progressing toward goals  Acute Rehab OT Goals Patient Stated Goal: none stated OT Goal Formulation: With patient Time For Goal Achievement: 05/12/22 Potential to Achieve Goals: Good ADL Goals Pt Will Perform Upper Body Dressing: with supervision;sitting;standing Pt Will Perform Lower Body Dressing: with min assist;sitting/lateral leans;sit to/from stand Pt Will Transfer to Toilet: with min assist;ambulating;regular height toilet Pt Will Perform Tub/Shower Transfer: Tub transfer;Shower transfer;3 in 1;rolling walker;with min assist  Plan Discharge plan remains appropriate;Frequency remains appropriate    Co-evaluation                 AM-PAC OT "6 Clicks" Daily Activity     Outcome Measure   Help from another person eating meals?: None Help from another person taking care of personal grooming?: A Little Help from another person toileting, which includes using toliet, bedpan, or urinal?: A Little Help from another person bathing (including washing, rinsing, drying)?: A Lot Help from another person to put on and taking off regular upper body clothing?: A Little Help from another person to put on and taking off regular lower body clothing?: A Lot 6 Click  Score: 17    End of Session Equipment Utilized During Treatment: Gait belt;Rolling walker (2 wheels)  OT Visit Diagnosis: Unsteadiness on feet (R26.81);Other abnormalities of gait and mobility (R26.89);Muscle weakness (generalized) (M62.81);Other symptoms and signs involving cognitive function   Activity Tolerance Patient tolerated treatment well   Patient Left in chair;with call bell/phone within reach;with chair alarm set   Nurse Communication Mobility status        Time: 6811-5726 OT Time Calculation (min): 14 min  Charges: OT General Charges $OT Visit: 1 Visit OT Treatments $Self Care/Home Management : 8-22 mins  Alfonzo Beers, OTD, OTR/L Acute Rehab [phone]   Rolm Gala Quanasia Defino 04/29/2022, 11:30 AM

## 2022-04-29 NOTE — Consult Note (Addendum)
[facility] Psychiatry Followup Face-to-Face Psychiatric Evaluation   Service Date: April 29, 2022 LOS:  LOS: 2 days    Assessment  [name] is a 81 y.o. male admitted medically for 04/27/2022 11:39 AM for AMS. He carries the psychiatric diagnoses of ?remote hx depression and has a past medical history of  asthma, CAD, HTN, heart murmur, hyperlipidimia, MI.Psychiatry was consulted for depression by Dr. Lowell George.   Pt likely was depressed at home based on history and report, however has ongoing s/s of encephalopathy which currently make the risk of starting antidepressants higher than the benefit - in the immediate time frame likely to worsen delirium and would not help for depression for at least 2-4 weeks.   His current presentation is most consistent with mild depression. The patient current does not meet criteria for MDD as he is reporting good mood, sleep, and appetite. Today, he had better attention in the interview and was able to answer questions clearly. At this time, we recommend the patient to follow up with an outpatient psychiatry upon discharge as he is not at high risk to harm himself or others. Please see plan below for detailed recommendations.   Diagnoses:  Active Hospital problems: Principal Problem:   Encephalopathy Active Problems:   Essential hypertension   COPD with acute exacerbation (HCC)   Acute encephalopathy   Hematuria   Acute metabolic encephalopathy   Protein-calorie malnutrition, severe (HCC)   AKI (acute kidney injury) (HCC)   Hyperkalemia   Depression   Tobacco abuse   Fall at home, initial encounter   Stroke-like symptoms   Hypertrophic toenail     Plan  ## Safety and Observation Level:  - Based on my clinical evaluation, I estimate the patient to be at low risk of self harm in the current setting - At this time, we recommend a routine level of observation. This decision is based on my review of the chart including patient's history and  current presentation, interview of the patient, mental status examination, and consideration of suicide risk including evaluating suicidal ideation, plan, intent, suicidal or self-harm behaviors, risk factors, and protective factors. This judgment is based on our ability to directly address suicide risk, implement suicide prevention strategies and develop a safety plan while the patient is in the clinical setting. Please contact our team if there is a concern that risk level has changed.   ## Medications:  -- given oral thiamine -- c/w olanzapine 2.5 mg PO or IM qdaily PRN for agitation  ## Medical Decision Making Capacity:  Not formally assessed  ## Further Work-up:  -- appreciate thorough w/u done by primary team    -- most recent EKG on 7/1 had QtC of 438 -- Pertinent labwork reviewed earlier this admission includes:   Albumin - low 7/3 TSH - wnl 7/2 CBC with anemia Ammonia wnl  ## Disposition:  -- per primary  ## Behavioral / Environmental:  -- delirium precautions preordered  ##Legal Status   Thank you for this consult request. Recommendations have been communicated to the primary team.  We will sign off at this time.   Christia Reading, MD   Followup history  Relevant Aspects of Hospital Course:  Admitted on 04/27/2022 for AMS - family reported concerns for depression as wife has been put in a nursing home.  Patient Report:  Patient seen in the morning. He is oriented today, knowing his name, where he is, the day of the week, and the reason for his hospital  admission. He did note that he currently lives with his wife even though per note it says that she currently lives in a nursing home He notes his mood is "fine" and has no issues with sleep or appetite. He denies current suicidal ideations, homicidal ideations, and audio/visual hallucinations.    Collateral information:  Called wife twice with no answer  Psychiatric History:  Information collected from  pt  Family psych history: unknown   Social History:  Lives at home alone. Wife in nursing home since 10/22. At least 1 daughter and 1 son listed in the chart.   Family History:  The patient's family history includes Hypertension in an other family member.  Medical History: Past Medical History:  Diagnosis Date   Asthma    as a child   Carotid arterial disease (HCC)    a. 11/2015 Carotid U/S: LICA 40-59%, RICA 100 CTO, nl subclavian arteries--f/u 1 yr.   Coronary atherosclerosis of native coronary artery    a. 09/2001 inflat MI/PCI: RCA 43m (3.0x18 AVE S7 BMS); b. 07/2006 Cath: LM nl, LAD 50p, LCX min irregs, OM1 large, min irregs, OM2 min irregs, RCA 20 ISR;  c. 10/2012 MV: EF 74%, no ischemia.   Essential hypertension    Heart murmur    since a child   Hyperlipidemia    Myocardial infarction [facility]) 2002   Pneumonia    age 26   Right inguinal hernia     Surgical History: Past Surgical History:  Procedure Laterality Date   CARDIAC CATHETERIZATION     Carotid stents     COLONOSCOPY     FRACTURE SURGERY     HERNIA REPAIR     ventral   INGUINAL HERNIA REPAIR Right 11/01/2016   Procedure: RIGHT INGUINAL HERNIA REPAIR WITH MESH;  Surgeon: Avel Peace, MD;  [facility];  Service: General;  Laterality: Right;   INSERTION OF MESH Right 11/01/2016   Procedure: INSERTION OF MESH;  Surgeon: Avel Peace, MD;  Location: [facility] OR;  Service: General;  Laterality: Right;   ORIF SHOULDER FRACTURE Right     Medications:   Current Facility-Administered Medications:    albuterol (PROVENTIL) (2.5 MG/3ML) 0.083% nebulizer solution 2.5 mg, 2.5 mg, Nebulization, Q6H PRN, Mikey College T, MD   albuterol (PROVENTIL) (2.5 MG/3ML) 0.083% nebulizer solution 2.5 mg, 2.5 mg, Nebulization, TID, Zigmund Daniel., MD, 2.5 mg at 04/29/22 7048   aspirin EC tablet 81 mg, 81 mg, Oral, Daily, Mikey College T, MD, 81 mg at 04/29/22 1047   bisacodyl (DULCOLAX) EC tablet 5 mg, 5 mg, Oral, Daily PRN,  Mikey College T, MD   doxycycline (VIBRA-TABS) tablet 100 mg, 100 mg, Oral, Q12H, Zigmund Daniel., MD, 100 mg at 04/29/22 1048   enoxaparin (LOVENOX) injection 40 mg, 40 mg, Subcutaneous, Q24H, Mikey College T, MD, 40 mg at 04/28/22 2200   ezetimibe (ZETIA) tablet 10 mg, 10 mg, Oral, Daily, Mikey College T, MD, 10 mg at 04/29/22 1048   hydrocerin (EUCERIN) cream, , Topical, BID, Zigmund Daniel., MD, Given at 04/29/22 1048   hydrochlorothiazide (HYDRODIURIL) tablet 12.5 mg, 12.5 mg, Oral, Daily, Chipper Herb, Ping T, MD, 12.5 mg at 04/29/22 1048   mometasone-formoterol (DULERA) 200-5 MCG/ACT inhaler 2 puff, 2 puff, Inhalation, BID, Mikey College T, MD, 2 puff at 04/29/22 0735   nicotine (NICODERM CQ - dosed in mg/24 hours) patch 21 mg, 21 mg, Transdermal, Daily, Mikey College T, MD, 21 mg at 04/29/22 1048   nicotine polacrilex (NICORETTE)  gum 2 mg, 2 mg, Oral, PRN, Zigmund Daniel., MD   OLANZapine [facility]) tablet 2.5 mg, 2.5 mg, Oral, Daily PRN **OR** OLANZapine (ZYPREXA) injection 2.5 mg, 2.5 mg, Intramuscular, Daily PRN, Cinderella, Margaret A   ondansetron (ZOFRAN) tablet 4 mg, 4 mg, Oral, Q6H PRN **OR** ondansetron (ZOFRAN) injection 4 mg, 4 mg, Intravenous, Q6H PRN, Mikey College T, MD   phosphorus (K PHOS NEUTRAL) tablet 500 mg, 500 mg, Oral, TID AC & HS, Zigmund Daniel., MD, 500 mg at 04/29/22 1047   [COMPLETED] methylPREDNISolone sodium succinate (SOLU-MEDROL) 40 mg/mL injection 40 mg, 40 mg, Intravenous, Q12H, 40 mg at 04/28/22 1400 **FOLLOWED BY** predniSONE (DELTASONE) tablet 40 mg, 40 mg, Oral, Q breakfast, Mikey College T, MD, 40 mg at 04/29/22 5621   rosuvastatin (CRESTOR) tablet 40 mg, 40 mg, Oral, Daily, Mikey College T, MD, 40 mg at 04/29/22 1047  Allergies: Allergies  Allergen Reactions   Penicillins Hives and Swelling    Has patient had a PCN reaction causing immediate rash, facial/tongue/throat swelling, SOB or lightheadedness with hypotension: Yes Has patient had a PCN  reaction causing severe rash involving mucus membranes or skin necrosis: Yes Has patient had a PCN reaction that required hospitalization No Has patient had a PCN reaction occurring within the last 10 years: No If all of the above answers are "NO", then may proceed with Cephalosporin use.        Objective  Vital signs:  Temp:  [97.7 F (36.5 C)-98.5 F (36.9 C)] 98 F (36.7 C) (07/03 1205) Pulse Rate:  [55-79] 65 (07/03 1205) Resp:  [14-18] 18 (07/03 1205) BP: (118-134)/(61-78) 119/68 (07/03 1205) SpO2:  [91 %-100 %] 100 % (07/03 1205)  Psychiatric Specialty Exam:  Presentation  General Appearance: Appropriate for Environment  Eye Contact:Good  Speech:Clear and Coherent  Speech Volume:Normal  Handedness:No data recorded  Mood and Affect  Mood:Euthymic  Affect:Congruent   Thought Process  Thought Processes:Coherent  Descriptions of Associations:Intact  Orientation:Full (Time, Place and Person)  Thought Content:Logical  History of Schizophrenia/Schizoaffective disorder:No data recorded Duration of Psychotic Symptoms:No data recorded Hallucinations:Hallucinations: None  Ideas of Reference:None  Suicidal Thoughts:Suicidal Thoughts: No  Homicidal Thoughts:Homicidal Thoughts: No   Sensorium  Memory:Immediate Good  Judgment:Fair  Insight:Good   Executive Functions  Concentration:Good  Attention Span:Good  Recall:Good  Fund of Knowledge:Good  Language:Good   Psychomotor Activity  Psychomotor Activity:Psychomotor Activity: Normal   Assets  Assets:Leisure Time; Housing; Social Support   Sleep  Sleep:Sleep: Good    Physical Exam: Physical Exam Constitutional:      Appearance: Normal appearance.  Pulmonary:     Effort: Pulmonary effort is normal.  Neurological:     General: No focal deficit present.     Mental Status: He is alert and oriented to person, place, and time.  Psychiatric:        Mood and Affect: Mood normal.         Thought Content: Thought content normal.    Review of Systems  Constitutional:  Negative for chills and fever.  Neurological:  Negative for dizziness and headaches.  Psychiatric/Behavioral:  Negative for depression and suicidal ideas.    Blood pressure 119/68, pulse 65, temperature 98 F (36.7 C), temperature source Oral, resp. rate 18, height 5\' 11"  (1.803 m), weight 55.6 kg, SpO2 100 %. Body mass index is 17.09 kg/m.

## 2022-04-29 NOTE — Assessment & Plan Note (Signed)
Tele with findings concerning for ventricular standstill He's asymptomatic EKG sinus Cardiology c/s, appreciate recommendations

## 2022-04-29 NOTE — Consult Note (Signed)
Psychiatry consult collateral note:  Per daughter, [name] ([phone]), the patient has been very depressed since his wife was placed in a memory care facility in November 2022. She would do all of the cooking, cleaning, and housework because the patient has a history of COPD and was very sedentary. Once his wife left, the patient wouldn't bathe, clip his nails, eat, or take care of himself. He would sit in his recliner and smoke cigarettes all day, only getting up to use the bathroom. She also believes he would not take his medications because he wanted to die. His daughter and son would visit him a total of 3x per day and take care of him.   The patient does not want to go to an assisted living facility, so today the patient, daughter, and social worker discussed a plan to have PT visit 2x per week and a home health worker visit 2x per week to help the patient bathe. The daughter and son will continue to visit him and make sure he compliant with his medications. If they feel he is not progressing, they will arrange placement in assisted living. The patient agrees to the plan.   - Vidal Schwalbe

## 2022-04-29 NOTE — Progress Notes (Signed)
PROGRESS NOTE    [name]  NWG:[id number] DOB: Feb 24, 1941 DOA: 04/27/2022 PCP: Etta Grandchild, MD  Chief Complaint  Patient presents with   Code Stroke    Brief Narrative:  [name] is [name] 81 y.o. male with medical history significant of COPD Gold stage III, chronic hypoxic failure, noncompliant with home O2 and COPD medications, HTN, HLD, CAD status post stenting, cigarette smoke, presented with fall and altered mentations.      Assessment & Plan:   Principal Problem:   Encephalopathy Active Problems:   Bradycardia   Acute metabolic encephalopathy   COPD with acute exacerbation (HCC)   Fall at home, initial encounter   Stroke-like symptoms   Hematuria   Hyperkalemia   AKI (acute kidney injury) (HCC)   Protein-calorie malnutrition, severe (HCC)   Essential hypertension   Depression   Tobacco abuse   Hypertrophic toenail   Acute encephalopathy   Assessment and Plan: Bradycardia Tele with findings concerning for ventricular standstill He's asymptomatic EKG sinus Cardiology c/s, appreciate recommendations  Acute metabolic encephalopathy Resolved at this point, at admission thought due to COPD exacerbation He's improved now, with treatment Delirium precautions B12, TSH wnl.  VBG without hypercarbia Pending B1 zyprexa daily prn per psych   Stroke-like symptoms Code stroke was called on admission, thought to have mild R sided motor deficits, mild ataxia/cognitive/communication deficits  Neurology recommended stroke workup MRI brain without acute abnormality. CTA head/neck with right ICA occluded at bifurcation with reconstitution at level of R posterior communicating artery, less than 50% stenosis of L ICA at bifurcation, moderate stenosis of up to 70% in the R vertebral artery as it enters spinal canal at C6, moderate stenosis of proximal L posterior cerebral artery Echo with EF 60-65%, grade II diastolic dysfunction  Appreciate neurology assistance ->  recommending aspirin for stroke prevention  Fall at home, initial encounter Found down on garage floor, unclear how long he'd been there Suspected related to COPD exacerbation/deconditioning      COPD with acute exacerbation (HCC) Wheezing noted on admission, continues to improve at this time Continue scheduled and prn nebs Steroids, abx  Hyperkalemia resolved  Hematuria Follow urine cx, needs additional w/u  AKI (acute kidney injury) (HCC) Dehydration Improved with IVF  Protein-calorie malnutrition, severe (HCC) noted  Essential hypertension HCTZ  Depression Psychiatry c/s Oral thiamine, olanzapine daily prn agitation   Tobacco abuse Encourage cessation Nicotine patch  Hypertrophic toenail Needs podiatry follow up     DVT prophylaxis: lovenox Code Status: DNR Family Communication: son Disposition:   Status is: Inpatient Remains inpatient appropriate because: need for therapy eval and safe d/c plan   Consultants:  Psych neurology  Procedures:  Echo IMPRESSIONS     1. Left ventricular ejection fraction, by estimation, is 60 to 65%. The  left ventricle has normal function. The left ventricle has no regional  wall motion abnormalities. There is mild left ventricular hypertrophy.  Left ventricular diastolic parameters  are consistent with Grade II diastolic dysfunction (pseudonormalization).   2. Right ventricular systolic function is normal. The right ventricular  size is normal.   3. The mitral valve is normal in structure. Mild mitral valve  regurgitation. No evidence of mitral stenosis. Moderate mitral annular  calcification.   4. The aortic valve is tricuspid. Aortic valve regurgitation is not  visualized. Aortic valve sclerosis is present, with no evidence of aortic  valve stenosis.   5. The inferior vena cava is normal in size with greater than 50%  respiratory variability, suggesting right atrial pressure of 3 mmHg.   Antimicrobials:   Anti-infectives (From admission, onward)    Start     Dose/Rate Route Frequency Ordered Stop   04/28/22 2200  doxycycline (VIBRA-TABS) tablet 100 mg        100 mg Oral Every 12 hours 04/28/22 1521         Subjective: No complaints  Objective: Vitals:   04/29/22 0807 04/29/22 1205 04/29/22 1407 04/29/22 1500  BP: 118/64 119/68    Pulse: 79 65    Resp: 18 18    Temp: 97.8 F (36.6 C) 98 F (36.7 C)    TempSrc: Oral Oral    SpO2: 93% 100% 100%   Weight:      Height:    5\' 9"  (1.753 m)   No intake or output data in the 24 hours ending 04/29/22 1517  Filed Weights   04/27/22 1136 04/28/22 0030  Weight: 57.6 kg 55.6 kg    Examination:  General: No acute distress. Cardiovascular: RRR Lungs: mild wheezing noted Abdomen: Soft, nontender, nondistended  Neurological: Alert and oriented 3. Moves all extremities 4. Cranial nerves II through XII grossly intact. Skin: Warm and dry. No rashes or lesions. Extremities: No clubbing or cyanosis. No edema.  Data Reviewed: I have personally reviewed following labs and imaging studies  CBC: Recent Labs  Lab 04/27/22 1138 04/27/22 1143 04/28/22 0307 04/29/22 0227  WBC  --  5.3 4.9 7.8  NEUTROABS  --  4.3  --  5.7  HGB 16.0 13.9 11.9* 11.3*  HCT 47.0 41.9 35.6* 34.3*  MCV  --  94.2 93.0 94.2  PLT  --  242 172 220    Basic Metabolic Panel: Recent Labs  Lab 04/27/22 1138 04/27/22 1143 04/28/22 0027 04/29/22 0227  NA 134* 137 135 138  K 5.5* 5.5* 3.8 2.8*  CL 97* 96* 106 98  CO2  --  27 19* 31  GLUCOSE 91 96 129* 150*  BUN 23 15 5* 23  CREATININE 1.10 1.17 0.65 1.13  CALCIUM  --  8.4* 9.1 8.4*  MG  --   --   --  2.0  PHOS  --   --   --  1.8*    GFR: Estimated Creatinine Clearance: 39.6 mL/min (by C-G formula based on SCr of 1.13 mg/dL).  Liver Function Tests: Recent Labs  Lab 04/27/22 1143 04/28/22 0027 04/29/22 0227  AST 41  --  41  ALT 27  --  18  ALKPHOS 84  --  70  BILITOT 1.9*  --  0.4  PROT  5.5*  --  4.5*  ALBUMIN 2.2* 2.8* 2.0*    CBG: Recent Labs  Lab 04/27/22 1132  GLUCAP 91     Recent Results (from the past 240 hour(s))  Resp Panel by RT-PCR (Flu Ramey Schiff&B, Covid) Anterior Nasal Swab     Status: None   Collection Time: 04/27/22  8:25 PM   Specimen: Anterior Nasal Swab  Result Value Ref Range Status   SARS Coronavirus 2 by RT PCR NEGATIVE NEGATIVE Final    Comment: (NOTE) SARS-CoV-2 target nucleic acids are NOT DETECTED.  The SARS-CoV-2 RNA is generally detectable in upper respiratory specimens during the acute phase of infection. The lowest concentration of SARS-CoV-2 viral copies this assay can detect is 138 copies/mL. Kennede Lusk negative result does not preclude SARS-Cov-2 infection and should not be used as the sole basis for treatment or other patient management decisions. Xaidyn Kepner negative result may occur  with  improper specimen collection/handling, submission of specimen other than nasopharyngeal swab, presence of viral mutation(s) within the areas targeted by this assay, and inadequate number of viral copies(<138 copies/mL). Kathrene Sinopoli negative result must be combined with clinical observations, patient history, and epidemiological information. The expected result is Negative.  Fact Sheet for Patients:  EntrepreneurPulse.com.au  Fact Sheet for Healthcare Providers:  IncredibleEmployment.be  This test is no t yet approved or cleared by the [place] FDA and  has been authorized for detection and/or diagnosis of SARS-CoV-2 by FDA under an Emergency Use Authorization (EUA). This EUA will remain  in effect (meaning this test can be used) for the duration of the COVID-19 declaration under Section 564(b)(1) of the Act, 21 U.S.C.section 360bbb-3(b)(1), unless the authorization is terminated  or revoked sooner.       Influenza Novalynn Branaman by PCR NEGATIVE NEGATIVE Final   Influenza B by PCR NEGATIVE NEGATIVE Final    Comment: (NOTE) The Xpert Xpress  SARS-CoV-2/FLU/RSV plus assay is intended as an aid in the diagnosis of influenza from Nasopharyngeal swab specimens and should not be used as Kavontae Pritchard sole basis for treatment. Nasal washings and aspirates are unacceptable for Xpert Xpress SARS-CoV-2/FLU/RSV testing.  Fact Sheet for Patients: EntrepreneurPulse.com.au  Fact Sheet for Healthcare Providers: IncredibleEmployment.be  This test is not yet approved or cleared by the [place] FDA and has been authorized for detection and/or diagnosis of SARS-CoV-2 by FDA under an Emergency Use Authorization (EUA). This EUA will remain in effect (meaning this test can be used) for the duration of the COVID-19 declaration under Section 564(b)(1) of the Act, 21 U.S.C. section 360bbb-3(b)(1), unless the authorization is terminated or revoked.  Performed at [facility], [place] [address] [place], [place] [address]          Radiology Studies: ECHOCARDIOGRAM COMPLETE  Result Date: 04/28/2022    ECHOCARDIOGRAM REPORT   Patient Name:   [name] Date of Exam: 04/28/2022 Medical Rec #:  [id number]     Height:       71.0 in Accession #:    [id number]    Weight:       122.5 lb Date of Birth:  06/13/1941    BSA:          1.712 m Patient Age:    24 years      BP:           121/67 mmHg Patient Gender: M             HR:           55 bpm. Exam Location:  Inpatient Procedure: 2D Echo, Color Doppler and Cardiac Doppler Indications:    Stroke i63.9  History:        Patient has prior history of Echocardiogram examinations, most                 recent 01/23/2017. COPD; Risk Factors:Hypertension and                 Dyslipidemia.  Sonographer:    Raquel Sarna Senior RDCS Referring Phys: [id number] [place]  1. Left ventricular ejection fraction, by estimation, is 60 to 65%. The left ventricle has normal function. The left ventricle has no regional wall motion abnormalities. There is mild left ventricular hypertrophy. Left  ventricular diastolic parameters are consistent with Grade II diastolic dysfunction (pseudonormalization).  2. Right ventricular systolic function is normal. The right ventricular size is normal.  3. The mitral valve is normal  in structure. Mild mitral valve regurgitation. No evidence of mitral stenosis. Moderate mitral annular calcification.  4. The aortic valve is tricuspid. Aortic valve regurgitation is not visualized. Aortic valve sclerosis is present, with no evidence of aortic valve stenosis.  5. The inferior vena cava is normal in size with greater than 50% respiratory variability, suggesting right atrial pressure of 3 mmHg. FINDINGS  Left Ventricle: Left ventricular ejection fraction, by estimation, is 60 to 65%. The left ventricle has normal function. The left ventricle has no regional wall motion abnormalities. The left ventricular internal cavity size was normal in size. There is  mild left ventricular hypertrophy. Left ventricular diastolic parameters are consistent with Grade II diastolic dysfunction (pseudonormalization). Right Ventricle: The right ventricular size is normal. Right ventricular systolic function is normal. Left Atrium: Left atrial size was normal in size. Right Atrium: Right atrial size was normal in size. Pericardium: There is no evidence of pericardial effusion. Mitral Valve: The mitral valve is normal in structure. Moderate mitral annular calcification. Mild mitral valve regurgitation. No evidence of mitral valve stenosis. Tricuspid Valve: The tricuspid valve is normal in structure. Tricuspid valve regurgitation is mild . No evidence of tricuspid stenosis. Aortic Valve: The aortic valve is tricuspid. Aortic valve regurgitation is not visualized. Aortic valve sclerosis is present, with no evidence of aortic valve stenosis. Pulmonic Valve: The pulmonic valve was normal in structure. Pulmonic valve regurgitation is trivial. No evidence of pulmonic stenosis. Aorta: The aortic root is  normal in size and structure. Venous: The inferior vena cava is normal in size with greater than 50% respiratory variability, suggesting right atrial pressure of 3 mmHg. IAS/Shunts: No atrial level shunt detected by color flow Doppler.  LEFT VENTRICLE PLAX 2D LVIDd:         4.55 cm   Diastology LVIDs:         3.10 cm   LV e' medial:    7.18 cm/s LV PW:         1.15 cm   LV E/e' medial:  12.5 LV IVS:        1.05 cm   LV e' lateral:   7.40 cm/s LVOT diam:     1.95 cm   LV E/e' lateral: 12.2 LV SV:         80 LV SV Index:   47 LVOT Area:     2.99 cm  RIGHT VENTRICLE RV S prime:     12.50 cm/s TAPSE (M-mode): 2.0 cm LEFT ATRIUM             Index        RIGHT ATRIUM           Index LA diam:        3.80 cm 2.22 cm/m   RA Area:     14.40 cm LA Vol (A2C):   40.5 ml 23.66 ml/m  RA Volume:   34.00 ml  19.86 ml/m LA Vol (A4C):   34.5 ml 20.15 ml/m LA Biplane Vol: 40.7 ml 23.77 ml/m  AORTIC VALVE LVOT Vmax:   106.00 cm/s LVOT Vmean:  78.200 cm/s LVOT VTI:    0.267 m  AORTA Ao Root diam: 3.25 cm MITRAL VALVE MV Area (PHT): 3.58 cm    SHUNTS MV Decel Time: 212 msec    Systemic VTI:  0.27 m MV E velocity: 90.10 cm/s  Systemic Diam: 1.95 cm MV Daleyza Gadomski velocity: 79.70 cm/s MV E/Emmette Katt ratio:  1.13 Kirk Ruths MD Electronically signed by Kirk Ruths MD Signature  Date/Time: 04/28/2022/4:04:41 PM    Final    MR BRAIN WO CONTRAST  Result Date: 04/27/2022 CLINICAL DATA:  TIA. New onset of leg weakness. Question left parietal infarct. EXAM: MRI HEAD WITHOUT CONTRAST TECHNIQUE: Multiplanar, multiecho pulse sequences of the brain and surrounding structures were obtained without intravenous contrast. COMPARISON:  CT head without contrast and CTA head and neck 04/27/2022 FINDINGS: Brain: Atrophy and white matter changes are moderately advanced for age. The ventricles are proportionate to the degree of atrophy. No acute infarct, hemorrhage, or mass lesion is present. Remote lacunar infarct is present along the posterior limb of the left  external capsule. Basal ganglia are intact. Insular ribbon is normal. The internal auditory canals are within normal limits. The brainstem and cerebellum are within normal limits. Vascular: Flow is present in the major intracranial arteries. Skull and upper cervical spine: The craniocervical junction is normal. Upper cervical spine is within normal limits. Marrow signal is unremarkable. Sinuses/Orbits: Small mastoid effusions are present. No obstructing nasopharyngeal lesion is present. The paranasal sinuses and mastoid air cells are otherwise clear. The globes and orbits are within normal limits. Other: IMPRESSION: 1. No acute intracranial abnormality. 2. Atrophy and white matter disease is moderately advanced for age. This likely reflects the sequela of chronic microvascular ischemia. 3. Remote lacunar infarct of the posterior limb of the left external capsule. Electronically Signed   By: San Morelle M.D.   On: 04/27/2022 18:27        Scheduled Meds:  albuterol  2.5 mg Nebulization TID   aspirin EC  81 mg Oral Daily   doxycycline  100 mg Oral Q12H   enoxaparin (LOVENOX) injection  40 mg Subcutaneous Q24H   ezetimibe  10 mg Oral Daily   hydrocerin   Topical BID   hydrochlorothiazide  12.5 mg Oral Daily   mometasone-formoterol  2 puff Inhalation BID   nicotine  21 mg Transdermal Daily   phosphorus  500 mg Oral TID AC & HS   predniSONE  40 mg Oral Q breakfast   rosuvastatin  40 mg Oral Daily   Continuous Infusions:   LOS: 2 days    Time spent: over 30 min     Fayrene Helper, MD [facility]   To contact the attending provider between 7A-7P or the covering provider during after hours 7P-7A, please log into the web site [email or web address] and access using universal  password for that web site. If you do not have the password, please call the hospital operator.  04/29/2022, 3:17 PM

## 2022-04-29 NOTE — TOC Initial Note (Signed)
Transition of Care [facility]) - Initial/Assessment Note    Patient Details  Name: [name] MRN: [id number] Date of Birth: 01-29-1941  [facility] CM/SW Contact:    Pollie Friar, RN Phone Number: 04/29/2022, 2:16 PM  Clinical Narrative:                 Pt lives at home alone. CM met with the patient and his daughter at the bedside. CM talked about home vs LTC. Currently daughter feels he can return home. CM went over that he walked 300 feet so if he went to SNF he would be discharged pretty quickly since not needing much physical rehab. She is interested in home health services. She has no preference. CM has arranged [facility] with information on the AVS.  Walker and 3 in 1 ordered for home and will be delivered to the room per [employer].  Pt has oxygen at home but per daughter wont wear it. CM inquired about compliance with his medications and she thought he was doing ok with those but the daughter and her brother will closely oversee those from now on. Family provides food for patient. Pt has been driving. CM went over having family provide his needed transportation. CM also asked again about him going to [facility] where the patients spouse lives but currently the family is not interested in him going to ALF.  TOC following.  Expected Discharge Plan: [place] Barriers to Discharge: Continued Medical Work up   Patient Goals and CMS Choice   CMS Medicare.gov Compare Post Acute Care list provided to:: Patient Represenative (must comment) Choice offered to / list presented to : Adult Children  Expected Discharge Plan and Services Expected Discharge Plan: [place]   Discharge Planning Services: CM Consult Post Acute Care Choice: Home Health, Durable Medical Equipment Living arrangements for the past 2 months: Single Family Home                 DME Arranged: 3-N-1, [place] rolling DME Agency: [employer] Date DME Agency Contacted:  04/29/22   Representative spoke with at DME Agency: Jamal Maes HH Arranged: Nurse's Aide, PT, OT, RN [place] Agency: [place] Date [place]: 04/29/22   Representative spoke with at [place]: Jenny Reichmann  Prior Living Arrangements/Services Living arrangements for the past 2 months: [place] Lives with:: Self Patient language and need for interpreter reviewed:: Yes Do you feel safe going back to the place where you live?: Yes      Need for Family Participation in Patient Care: Yes (Comment) Care giver support system in place?: No (comment) Current home services: DME (oxygen)    Activities of Daily Living Home Assistive Devices/Equipment: Bathtub lift, Bedside commode/3-in-1 ADL Screening (condition at time of admission) Patient's cognitive ability adequate to safely complete daily activities?: No Is the patient deaf or have difficulty hearing?: No Does the patient have difficulty seeing, even when wearing glasses/contacts?: No Does the patient have difficulty concentrating, remembering, or making decisions?: Yes Patient able to express need for assistance with ADLs?: Yes Does the patient have difficulty dressing or bathing?: Yes Independently performs ADLs?: No Communication: Independent Dressing (OT): Needs assistance Is this a change from baseline?: Change from baseline, expected to last <3days Grooming: Needs assistance Is this a change from baseline?: Change from baseline, expected to last >3 days Feeding: Needs assistance Is this a change from baseline?: Change from baseline, expected to last >3 days  Bathing: Dependent Is this a change from baseline?: Change from baseline, expected to last >3 days Toileting: Needs assistance, Independent with device (comment) Is this a change from baseline?: Change from baseline, expected to last >3days In/Out Bed: Needs assistance Is this a change from baseline?: Change from baseline, expected to last >3  days Walks in Home: Needs assistance Is this a change from baseline?: Change from baseline, expected to last >3 days Does the patient have difficulty walking or climbing stairs?: Yes Weakness of Legs: Both Weakness of Arms/Hands: Both  Permission Sought/Granted                  Emotional Assessment Appearance:: Appears stated age Attitude/Demeanor/Rapport: Engaged Affect (typically observed): Accepting Orientation: : Oriented to Self, Oriented to Place   Psych Involvement: No (comment)  Admission diagnosis:  Encephalopathy [G93.40] Stroke-like episode [R29.90] Altered mental status, unspecified altered mental status type [R41.82] Patient Active Problem List   Diagnosis Date Noted   Hematuria 40/68/4033   Acute metabolic encephalopathy 53/31/7409   Protein-calorie malnutrition, severe ([place]) 04/28/2022   AKI (acute kidney injury) ([place]) 04/28/2022   Hyperkalemia 04/28/2022   Depression 04/28/2022   Tobacco abuse 04/28/2022   Fall at home, initial encounter 04/28/2022   Stroke-like symptoms 04/28/2022   Hypertrophic toenail 04/28/2022   Acute encephalopathy 04/27/2022   Encephalopathy 04/27/2022   Hypokalemia 10/04/2021   COPD with acute exacerbation ([place]) 10/04/2021   Nausea and vomiting 10/04/2021   Diarrhea 10/04/2021   Bilateral lower extremity edema 10/04/2021   Cholelithiasis 09/13/2021   Aortic atherosclerosis ([place]) 09/13/2021   Chronic hypoxemic respiratory failure ([place]) 08/16/2021   Fatigue 08/16/2021   Pulmonary nodule 08/16/2021   Leukocytosis 04/01/2019   Carotid stenosis, asymptomatic, bilateral 12/08/2017   Demand ischemia of myocardium ([place])    Essential hypertension    COPD III/ hypercarbic 10/07/2016   Chronic respiratory failure with hypercapnia ([place]) 10/07/2016   Pure hyperglyceridemia 09/04/2016   Moderate persistent asthma without complication 92/78/0044   Essential hypertension, benign 10/10/2009   Hyperlipidemia LDL goal <70 10/07/2008    CORONARY ATHEROSCLEROSIS NATIVE CORONARY ARTERY 10/07/2008   Cerebrovascular disease 10/07/2008   PCP:  Janith Lima, MD Pharmacy:   [id number] - [place], [address] AT [address] [place] [address] Phone: [phone] Fax: [phone]  [facility] [place], [place] - [address] AT [facility] [address] [place] [address] Phone: [phone] Fax: [phone]     Social Determinants of Health (SDOH) Interventions    Readmission Risk Interventions     No data to display

## 2022-04-29 NOTE — Consult Note (Addendum)
Cardiology Consultation:   Patient ID: [name] MRN: [id number]; DOB: 1940/12/28  Admit date: 04/27/2022 Date of Consult: 04/29/2022  PCP:  Janith Lima, MD   [facility] Providers Cardiologist:  Kirk Ruths, MD  Last seen 2021}     Patient Profile:   [name] is a 81 y.o. male with a hx of CAD  who is being seen 04/29/2022 for the evaluation of heart block  at the request of Dr Florene Glen.  History of Present Illness:   [name] is a 81 year old with a history of coronary artery disease.  Catheterization in 2007 showed 50% LAD, patent stent to the RCA with 20% in-stent restenosis.  Nuclear scan in 2014 showed no ischemia.  Patient also has a history of diastolic dysfunction, moderate CV moderate to severe CV disease with a 40 to 59% left ICA and 100% right ICA.  In addition the patient has a history of hypertension, COPD and hyperlipidemia.  The patient was last seen in cardiology clinic back in 2021. Patient was admitted on 04/27/2022 after presenting for fall and altered mental status.  [place]   He says he stumbled   Denied dizziness   No palpitations   ? If hx reliable though as when found by family member was confuse, found on ground.   Brought to ED   Per the pt, he says he fell one other time this year   Last time a few months ago   [name] records says more times   (? If hx obtained from family)   Per the chart, pt's family says he is depressed,   smoking again.   Patient denies    Patient refused home O2.    t.  Review of notes in chart,  since yesterday  his encephalopathy improved. Impression was ? COPD flare  Neurology was consulted  to see the patient.  Stroke work-up conducted.  Felt to be felt TIA versus encephalopathy  The pt denies dizziness   No palpitations   NO CP      WHile I was interviewing him he appeared sleepy.  Dozing Says he lives alone  Family is in town     Past Medical History:  Diagnosis Date   Asthma    as a  child   Carotid arterial disease ([place])    a. [id number] Carotid U/S: LICA 123456, RICA 123XX123 CTO, nl subclavian arteries--f/u 1 yr.   Coronary atherosclerosis of native coronary artery    a. 09/2001 inflat MI/PCI: RCA 59m (3.0x18 AVE S7 BMS); b. 07/2006 Cath: LM nl, LAD 50p, LCX min irregs, OM1 large, min irregs, OM2 min irregs, RCA 20 ISR;  c. 10/2012 MV: EF 74%, no ischemia.   Essential hypertension    Heart murmur    since a child   Hyperlipidemia    Myocardial infarction [facility]) 2002   Pneumonia    age 41   Right inguinal hernia     Past Surgical History:  Procedure Laterality Date   CARDIAC CATHETERIZATION     Carotid stents     COLONOSCOPY     FRACTURE SURGERY     HERNIA REPAIR     ventral   INGUINAL HERNIA REPAIR Right 11/01/2016   Procedure: RIGHT INGUINAL HERNIA REPAIR WITH MESH;  Surgeon: Jackolyn Confer, MD;  Location: [place];  Service: General;  Laterality: Right;   INSERTION OF MESH Right 11/01/2016   Procedure: INSERTION OF MESH;  Surgeon:  Jackolyn Confer, MD;  Location: [place];  Service: General;  Laterality: Right;   ORIF SHOULDER FRACTURE Right        Inpatient Medications: Scheduled Meds:  albuterol  2.5 mg Nebulization TID   aspirin EC  81 mg Oral Daily   doxycycline  100 mg Oral Q12H   enoxaparin (LOVENOX) injection  40 mg Subcutaneous Q24H   ezetimibe  10 mg Oral Daily   hydrocerin   Topical BID   hydrochlorothiazide  12.5 mg Oral Daily   mometasone-formoterol  2 puff Inhalation BID   nicotine  21 mg Transdermal Daily   phosphorus  500 mg Oral TID AC & HS   predniSONE  40 mg Oral Q breakfast   rosuvastatin  40 mg Oral Daily   Continuous Infusions:  PRN Meds: albuterol, bisacodyl, nicotine polacrilex, OLANZapine **OR** OLANZapine, ondansetron **OR** ondansetron (ZOFRAN) IV  Allergies:    Allergies  Allergen Reactions   Penicillins Hives and Swelling    Has patient had a PCN reaction causing immediate rash, facial/tongue/throat swelling, SOB or  lightheadedness with hypotension: Yes Has patient had a PCN reaction causing severe rash involving mucus membranes or skin necrosis: Yes Has patient had a PCN reaction that required hospitalization No Has patient had a PCN reaction occurring within the last 10 years: No If all of the above answers are "NO", then may proceed with Cephalosporin use.     Social History:   Social History   Socioeconomic History   Marital status: Married    Spouse name: Not on file   Number of children: Not on file   Years of education: Not on file   Highest education level: Not on file  Occupational History    Employer: RETIRED  Tobacco Use   Smoking status: Former    Packs/day: 1.00    Years: 44.00    Total pack years: 44.00    Types: Cigarettes    Quit date: 10/28/1998    Years since quitting: 23.5   Smokeless tobacco: Former    Types: Chew   Tobacco comments:    quit chewing tobacco 2001 ish  Substance and Sexual Activity   Alcohol use: No    Alcohol/week: 0.0 standard drinks of alcohol   Drug use: No   Sexual activity: Not on file  Other Topics Concern   Not on file  Social History Narrative   Lives in [place] with his wife.   Social Determinants of Health   Financial Resource Strain: Not on file  Food Insecurity: Not on file  Transportation Needs: Not on file  Physical Activity: Not on file  Stress: Not on file  Social Connections: Not on file  Intimate Partner Violence: Not on file    Family History:    Family History  Problem Relation Age of Onset   Hypertension Other      ROS:  Please see the history of present illness.   All other ROS reviewed and negative.     Physical Exam/Data:   Vitals:   04/29/22 0737 04/29/22 0807 04/29/22 1205 04/29/22 1407  BP:  118/64 119/68   Pulse:  79 65   Resp:  18 18   Temp:  97.8 F (36.6 C) 98 F (36.7 C)   TempSrc:  Oral Oral   SpO2: 93% 93% 100% 100%  Weight:      Height:       No intake or output data in the 24  hours ending 04/29/22 1415    04/28/2022  12:30 AM 04/27/2022   11:36 AM 11/06/2021    2:51 PM  Last 3 Weights  Weight (lbs) 122 lb 8 oz 126 lb 15.8 oz 141 lb 12.8 oz  Weight (kg) 55.566 kg 57.6 kg 64.32 kg     Body mass index is 17.09 kg/m.  General:  Thin 81 yo  in no acute distress HEENT: normal Neck: no JVD Vascular: No carotid bruits; Distal pulses 2+ bilaterally Cardiac:  normal S1, S2; RRR; no murmur  Lungs:  clear to auscultation bilaterally, no wheezing, rhonchi or rales  Abd: soft, nontender, no hepatomegaly  Ext: no edema Musculoskeletal:  No deformities, BUE and BLE strength normal and equal Skin: warm and dry Cracked skin  Neuro:  CNs 2-12 intact, no focal abnormalities noted Psych:  Normal affect   EKG:  The EKG was personally reviewed and demonstrates:  Telemetry:  Telemetry was personally reviewed and demonstrates:  SR,    Relevant CV Studies: Left ventricular ejection fraction, by estimation, is 60 to 65%. The left ventricle has normal function. The left ventricle has no regional wall motion abnormalities. There is mild left ventricular hypertrophy. Left ventricular diastolic parameters are consistent with Grade II diastolic dysfunction (pseudonormalization). 1. 2. Right ventricular systolic function is normal. The right ventricular size is normal. The mitral valve is normal in structure. Mild mitral valve regurgitation. No evidence of mitral stenosis. Moderate mitral annular calcification. 3. The aortic valve is tricuspid. Aortic valve regurgitation is not visualized. Aortic valve sclerosis is present, with no evidence of aortic valve stenosis. 4. The inferior vena cava is normal in size with greater than 50% respiratory variability, suggesting right atrial pressure of 3 mmHg.   Carotid USN  Feb 2023    Right Carotid: Evidence consistent with a total occlusion of the right ICA. Left Carotid: Velocities in the left ICA are consistent with a 40-59%  stenosis. Vertebrals: Bilateral vertebral arteries demonstrate antegrade flow. Subclavians: Normal flow hemodynamics were seen in bilateral subclavian arteries. *See table(s) above for measurements and observations. Suggest follow up study in 12 months. Laboratory Data:  High Sensitivity Troponin:  No results for input(s): "TROPONINIHS" in the last 720 hours.   Chemistry Recent Labs  Lab 04/27/22 1143 04/28/22 0027 04/29/22 0227  NA 137 135 138  K 5.5* 3.8 2.8*  CL 96* 106 98  CO2 27 19* 31  GLUCOSE 96 129* 150*  BUN 15 5* 23  CREATININE 1.17 0.65 1.13  CALCIUM 8.4* 9.1 8.4*  MG  --   --  2.0  GFRNONAA >60 >60 >60  ANIONGAP 14 10 9     Recent Labs  Lab 04/27/22 1143 04/28/22 0027 04/29/22 0227  PROT 5.5*  --  4.5*  ALBUMIN 2.2* 2.8* 2.0*  AST 41  --  41  ALT 27  --  18  ALKPHOS 84  --  70  BILITOT 1.9*  --  0.4   Lipids No results for input(s): "CHOL", "TRIG", "HDL", "LABVLDL", "LDLCALC", "CHOLHDL" in the last 168 hours.  Hematology Recent Labs  Lab 04/27/22 1143 04/28/22 0307 04/29/22 0227  WBC 5.3 4.9 7.8  RBC 4.45 3.83* 3.64*  HGB 13.9 11.9* 11.3*  HCT 41.9 35.6* 34.3*  MCV 94.2 93.0 94.2  MCH 31.2 31.1 31.0  MCHC 33.2 33.4 32.9  RDW 16.2* 16.0* 16.3*  PLT 242 172 220   Thyroid  Recent Labs  Lab 04/28/22 0307  TSH 0.878    BNPNo results for input(s): "BNP", "PROBNP" in the last 168 hours.  DDimer  No results for input(s): "DDIMER" in the last 168 hours.   Radiology/Studies:  ECHOCARDIOGRAM COMPLETE  Result Date: 04/28/2022    ECHOCARDIOGRAM REPORT   Patient Name:   KANYON GRUN Date of Exam: 04/28/2022 Medical Rec #:  [id number]     Height:       71.0 in Accession #:    [id number]    Weight:       122.5 lb Date of Birth:  August 18, 1941    BSA:          1.712 m Patient Age:    73 years      BP:           121/67 mmHg Patient Gender: M             HR:           55 bpm. Exam Location:  Inpatient Procedure: 2D Echo, Color Doppler and Cardiac Doppler  Indications:    Stroke i63.9  History:        Patient has prior history of Echocardiogram examinations, most                 recent 01/23/2017. COPD; Risk Factors:Hypertension and                 Dyslipidemia.  Sonographer:    Raquel Sarna Senior RDCS Referring Phys: [id number] [place]  1. Left ventricular ejection fraction, by estimation, is 60 to 65%. The left ventricle has normal function. The left ventricle has no regional wall motion abnormalities. There is mild left ventricular hypertrophy. Left ventricular diastolic parameters are consistent with Grade II diastolic dysfunction (pseudonormalization).  2. Right ventricular systolic function is normal. The right ventricular size is normal.  3. The mitral valve is normal in structure. Mild mitral valve regurgitation. No evidence of mitral stenosis. Moderate mitral annular calcification.  4. The aortic valve is tricuspid. Aortic valve regurgitation is not visualized. Aortic valve sclerosis is present, with no evidence of aortic valve stenosis.  5. The inferior vena cava is normal in size with greater than 50% respiratory variability, suggesting right atrial pressure of 3 mmHg. FINDINGS  Left Ventricle: Left ventricular ejection fraction, by estimation, is 60 to 65%. The left ventricle has normal function. The left ventricle has no regional wall motion abnormalities. The left ventricular internal cavity size was normal in size. There is  mild left ventricular hypertrophy. Left ventricular diastolic parameters are consistent with Grade II diastolic dysfunction (pseudonormalization). Right Ventricle: The right ventricular size is normal. Right ventricular systolic function is normal. Left Atrium: Left atrial size was normal in size. Right Atrium: Right atrial size was normal in size. Pericardium: There is no evidence of pericardial effusion. Mitral Valve: The mitral valve is normal in structure. Moderate mitral annular calcification. Mild mitral valve  regurgitation. No evidence of mitral valve stenosis. Tricuspid Valve: The tricuspid valve is normal in structure. Tricuspid valve regurgitation is mild . No evidence of tricuspid stenosis. Aortic Valve: The aortic valve is tricuspid. Aortic valve regurgitation is not visualized. Aortic valve sclerosis is present, with no evidence of aortic valve stenosis. Pulmonic Valve: The pulmonic valve was normal in structure. Pulmonic valve regurgitation is trivial. No evidence of pulmonic stenosis. Aorta: The aortic root is normal in size and structure. Venous: The inferior vena cava is normal in size with greater than 50% respiratory variability, suggesting right atrial pressure of 3 mmHg. IAS/Shunts: No atrial level shunt detected by color flow Doppler.  LEFT VENTRICLE PLAX 2D LVIDd:  4.55 cm   Diastology LVIDs:         3.10 cm   LV e' medial:    7.18 cm/s LV PW:         1.15 cm   LV E/e' medial:  12.5 LV IVS:        1.05 cm   LV e' lateral:   7.40 cm/s LVOT diam:     1.95 cm   LV E/e' lateral: 12.2 LV SV:         80 LV SV Index:   47 LVOT Area:     2.99 cm  RIGHT VENTRICLE RV S prime:     12.50 cm/s TAPSE (M-mode): 2.0 cm LEFT ATRIUM             Index        RIGHT ATRIUM           Index LA diam:        3.80 cm 2.22 cm/m   RA Area:     14.40 cm LA Vol (A2C):   40.5 ml 23.66 ml/m  RA Volume:   34.00 ml  19.86 ml/m LA Vol (A4C):   34.5 ml 20.15 ml/m LA Biplane Vol: 40.7 ml 23.77 ml/m  AORTIC VALVE LVOT Vmax:   106.00 cm/s LVOT Vmean:  78.200 cm/s LVOT VTI:    0.267 m  AORTA Ao Root diam: 3.25 cm MITRAL VALVE MV Area (PHT): 3.58 cm    SHUNTS MV Decel Time: 212 msec    Systemic VTI:  0.27 m MV E velocity: 90.10 cm/s  Systemic Diam: 1.95 cm MV A velocity: 79.70 cm/s MV E/A ratio:  1.13 Kirk Ruths MD Electronically signed by Kirk Ruths MD Signature Date/Time: 04/28/2022/4:04:41 PM    Final    MR BRAIN WO CONTRAST  Result Date: 04/27/2022 CLINICAL DATA:  TIA. New onset of leg weakness. Question left parietal  infarct. EXAM: MRI HEAD WITHOUT CONTRAST TECHNIQUE: Multiplanar, multiecho pulse sequences of the brain and surrounding structures were obtained without intravenous contrast. COMPARISON:  CT head without contrast and CTA head and neck 04/27/2022 FINDINGS: Brain: Atrophy and white matter changes are moderately advanced for age. The ventricles are proportionate to the degree of atrophy. No acute infarct, hemorrhage, or mass lesion is present. Remote lacunar infarct is present along the posterior limb of the left external capsule. Basal ganglia are intact. Insular ribbon is normal. The internal auditory canals are within normal limits. The brainstem and cerebellum are within normal limits. Vascular: Flow is present in the major intracranial arteries. Skull and upper cervical spine: The craniocervical junction is normal. Upper cervical spine is within normal limits. Marrow signal is unremarkable. Sinuses/Orbits: Small mastoid effusions are present. No obstructing nasopharyngeal lesion is present. The paranasal sinuses and mastoid air cells are otherwise clear. The globes and orbits are within normal limits. Other: IMPRESSION: 1. No acute intracranial abnormality. 2. Atrophy and white matter disease is moderately advanced for age. This likely reflects the sequela of chronic microvascular ischemia. 3. Remote lacunar infarct of the posterior limb of the left external capsule. Electronically Signed   By: San Morelle M.D.   On: 04/27/2022 18:27   CT ANGIO HEAD NECK W WO CM W PERF (CODE STROKE)  Result Date: 04/27/2022 CLINICAL DATA:  Code stroke. Question left parietal infarction. EXAM: CT ANGIOGRAPHY HEAD AND NECK CT PERFUSION BRAIN TECHNIQUE: Multidetector CT imaging of the head and neck was performed using the standard protocol during bolus administration of intravenous contrast. Multiplanar CT  image reconstructions and MIPs were obtained to evaluate the vascular anatomy. Carotid stenosis measurements (when  applicable) are obtained utilizing NASCET criteria, using the distal internal carotid diameter as the denominator. Multiphase CT imaging of the brain was performed following IV bolus contrast injection. Subsequent parametric perfusion maps were calculated using RAPID software. RADIATION DOSE REDUCTION: This exam was performed according to the departmental dose-optimization program which includes automated exposure control, adjustment of the mA and/or kV according to patient size and/or use of iterative reconstruction technique. CONTRAST:  143mL OMNIPAQUE IOHEXOL 350 MG/ML SOLN COMPARISON:  CT head without contrast 04/27/2022. FINDINGS: CTA NECK FINDINGS Aortic arch: Atherosclerotic calcifications are present at the aortic arch and great vessel origins without focal aneurysm or stenosis. Right carotid system: Atherosclerotic calcifications are present in the distal right common carotid artery. The right internal carotid artery is occluded. No reconstitution in the neck. Left carotid system: The left common carotid artery demonstrates some distal calcification. Dense calcifications are present at the bifurcation. The lumen is narrowed to 2.3 mm, less than 50% stenosis relative to the more normal distal left ICA. Vertebral arteries: The vertebral arteries are codominant. Both vertebral arteries originate from the subclavian arteries without significant stenosis. Moderate narrowing of up to 50% is present in the right vertebral artery as it enters the spinal canal at C6. No other significant focal stenosis is present in either vertebral artery in the neck. Skeleton: Degenerative changes are present in the cervical spine with grade 1 anterolisthesis at C3-4 and C4-5. Marked endplate changes are present at C5-6 and C6-7. No focal osseous lesions are present. The patient is edentulous. Other neck: Diffuse subcutaneous edema is present in the neck. No discrete lesion or abscess is present. No significant adenopathy is  present. The submandibular and parotid glands and ducts are within normal limits. No focal mucosal lesions are present. The thyroid is normal. Upper chest: Centrilobular emphysematous changes are present. Scarring is present at both lung apices. Thoracic inlet is within normal limits. Review of the MIP images confirms the above findings CTA HEAD FINDINGS Anterior circulation: The right internal carotid artery is reconstituted at the level of the posterior communicating artery. Atherosclerotic calcifications are present within the cavernous left ICA without a significant stenosis relative to the ICA terminus. The left A1 segment is dominant. The anterior communicating artery is patent. The MCA scratched at the M1 segments are normal bilaterally. MCA bifurcations are within normal limits. The ACA and MCA branch vessels are within normal limits. Posterior circulation: Moderate stenosis is present at the dural margin of the right vertebral artery. The intracranial left vertebral artery is the dominant vessel. PICA origins are visualized and normal. Vertebrobasilar junction is normal. Basilar artery is normal. Both posterior cerebral arteries originate from basilar tip. Moderate stenosis is present in the proximal left posterior cerebral artery. Pial collateral vessels contribute. Venous sinuses: The dural sinuses are patent. The straight sinus deep cerebral veins are intact. Cortical veins are within normal limits. No significant vascular malformation is evident. Anatomic variants: Prominent right posterior communicating artery reconstituting the right ICA. Review of the MIP images confirms the above findings CT Brain Perfusion Findings: ASPECTS: 10/10 CBF (<30%) Volume: 37mL Perfusion (Tmax>6.0s) volume: 75mL Mismatch Volume: 47mL Infarction Location:N/A IMPRESSION: 1. CT perfusion demonstrates no acute infarct or significant area of ischemia. 2. The right internal carotid artery is occluded at the bifurcation with  reconstitution at the level of the right posterior communicating artery. 3. Less than 50% stenosis of the left internal carotid  artery at the bifurcation. 4. Moderate stenosis of up to 70% in the right vertebral artery as it enters the spinal canal at C6. 5. Moderate stenosis of the proximal left posterior cerebral artery with pial collaterals filling the territory. 6. Aortic Atherosclerosis (ICD10-I70.0) and Emphysema (ICD10-J43.9). Electronically Signed   By: Marin Roberts M.D.   On: 04/27/2022 12:36   CT HEAD CODE STROKE WO CONTRAST`  Result Date: 04/27/2022 CLINICAL DATA:  Code stroke.  New onset of leg weakness. Fell. EXAM: CT HEAD WITHOUT CONTRAST TECHNIQUE: Contiguous axial images were obtained from the base of the skull through the vertex without intravenous contrast. RADIATION DOSE REDUCTION: This exam was performed according to the departmental dose-optimization program which includes automated exposure control, adjustment of the mA and/or kV according to patient size and/or use of iterative reconstruction technique. COMPARISON:  None Available. FINDINGS: Brain: No acute infarct, hemorrhage, or mass lesion is present. Moderate atrophy and white matter changes are present bilaterally. Basal ganglia are intact. Insular ribbon is normal. No acute or focal cortical abnormality is present. The ventricles are proportionate to the degree of atrophy. No significant extraaxial fluid collection is present. The brainstem and cerebellum are within normal limits. Vascular: Atherosclerotic calcifications are present within the cavernous internal carotid arteries. Hyperdense vessel is present. Skull: Calvarium is intact. No focal lytic or blastic lesions are present. No significant extracranial soft tissue lesion is present. Sinuses/Orbits: Shrunken maxillary sinuses are present bilaterally. No acute sinus disease is present. The globes and orbits are within normal limits. ASPECTS [facility] Stroke Program Early  CT Score) - Ganglionic level infarction (caudate, lentiform nuclei, internal capsule, insula, M1-M3 cortex): 7/7 - Supraganglionic infarction (M4-M6 cortex): 3/3 Total score (0-10 with 10 being normal): 10/10 IMPRESSION: 1. No acute intracranial abnormality. 2. Moderate atrophy and white matter disease likely reflects the sequela of chronic microvascular ischemia. 3. ASPECTS is 10/10. * Electronically Signed   By: Marin Roberts M.D.   On: 04/27/2022 12:03     Assessment and Plan:   1  Sinus node dysfunction .    Pt with 7.2 second pause this am   Not on any agents.    ? If above related to recent event of being found down.    I hae reviewed with EP    They will see pt  2.  CAD. REmote cath  REmote  Pt denies angina    3  PVCs   Pt with frequent PVCs on tele     LVEF normal  he denies palpitations    COuld quantitate with monitor    3.  Encephalopathy.  Per record it has improved   He still is sleepy     4.  COPD.  Patient treated with steroids and inhalers.  He is moving air    5  HL  Continue meds       5.  Failure to thrive.  Patient with protein calorie malnutrition.  Severely dehydrated on admission      For questions or updates, please contact [facility] Please consult [email or web address] for contact info under    Signed, Dietrich Pates, MD  04/29/2022 2:15 PM

## 2022-04-29 NOTE — Progress Notes (Signed)
Physical Therapy Treatment Patient Details Name: [name] MRN: [id number] DOB: 06-26-1941 Today's Date: 04/29/2022   History of Present Illness Pt is an 81 y/o male who presents 04/27/22 after being found down on the garage floor, awake and confused. MRI negative for acute changes. PMH significant for asthma, CAD, essential HTN, heart murmur, MI, R inguinal hernia s/p repair 11/01/2016, ventral hernia repair, ORIF shoulder fracture, multiple falls.    PT Comments    Pt is progressing well towards goals of PT. Today's session focused on mobility progression with pt tolerating 300 ft of ambulation with RW min A for steadiness and up to mod A to correct a LOB to prevent a fall. SpO2 was measured post-ambulation and dropped to lowest of 82% but able to reach 97% with ~2 min of pursed lip breathing while seated. Pt  presented min A for bed mobility and mod A for transfers. Pt continues to present with decreased strength, decreased tolerance to activity, and decreased safety and deficits awareness. For these reasons, pt will continue to benefit from skilled PT in order to increase independence for a safe d/c home. Will follow acutely.  Recommendations for follow up therapy are one component of a multi-disciplinary discharge planning process, led by the attending physician.  Recommendations may be updated based on patient status, additional functional criteria and insurance authorization.  Follow Up Recommendations  Home health PT     Assistance Recommended at Discharge Frequent or constant Supervision/Assistance  Patient can return home with the following A lot of help with walking and/or transfers;A lot of help with bathing/dressing/bathroom;Assistance with cooking/housework;Direct supervision/assist for medications management;Direct supervision/assist for financial management;Assist for transportation;Help with stairs or ramp for entrance   Equipment Recommendations  Rolling walker (2 wheels)     Recommendations for Other Services       Precautions / Restrictions Precautions Precautions: Fall Restrictions Weight Bearing Restrictions: No     Mobility  Bed Mobility Overal bed mobility: Needs Assistance Bed Mobility: Supine to Sit     Supine to sit: HOB elevated, Min assist     General bed mobility comments: Pt initiating transition to EOB, however unable to elevate trunk to full sitting position. Pt reaching out for HHA and required verbal cues to reach for rails, visible R shoulder elevation limitation when reaching. Min A for trunk elevation and vc's to scoot EOB. chair follow for safety, transferred to chair post-ambulation. SpO2 dropped to lowest of 82% post-ambulation on RA, able to reach 97% with cues for pursed lip breathing.    Transfers Overall transfer level: Needs assistance Equipment used: Rolling walker (2 wheels) Transfers: Sit to/from Stand Sit to Stand: Mod assist           General transfer comment: pt attempting to use momentum to power to stand, required modA to stand and cues for hand position on bed and RW.    Ambulation/Gait Ambulation/Gait assistance: Min assist, Mod assist Gait Distance (Feet): 300 Feet Assistive device: Rolling walker (2 wheels) Gait Pattern/deviations: Decreased stride length, Shuffle, Step-through pattern, Trunk flexed, Drifts right/left Gait velocity: Decreased Gait velocity interpretation: <1.31 ft/sec, indicative of household ambulator   General Gait Details: pt min A for straight stretches in hallway, required max vc's for safe use of RW when navigating turns. up to mod A to correct for LOB to prevent fall and steadiness throughout gait.   Stairs             Wheelchair Mobility    Modified Rankin (Stroke Patients  Only)       Balance Overall balance assessment: Needs assistance Sitting-balance support: Feet supported, No upper extremity supported Sitting balance-Leahy Scale: Poor   Postural  control: Right lateral lean, Posterior lean Standing balance support: Bilateral upper extremity supported, During functional activity, Reliant on assistive device for balance Standing balance-Leahy Scale: Poor Standing balance comment: Posterior lean and R lateral lean                            Cognition Arousal/Alertness: Awake/alert Behavior During Therapy: Flat affect Overall Cognitive Status: Impaired/Different from baseline Area of Impairment: Orientation, Attention, Following commands, Memory, Safety/judgement, Awareness, Problem solving                 Orientation Level: Disoriented to, Time Current Attention Level: Sustained Memory: Decreased short-term memory Following Commands: Follows one step commands consistently, Follows one step commands with increased time, Follows multi-step commands inconsistently Safety/Judgement: Decreased awareness of safety, Decreased awareness of deficits Awareness: Intellectual Problem Solving: Slow processing, Difficulty sequencing, Decreased initiation, Requires verbal cues          Exercises      General Comments        Pertinent Vitals/Pain Pain Assessment Pain Assessment: No/denies pain    Home Living                          Prior Function            PT Goals (current goals can now be found in the care plan section) Acute Rehab PT Goals Patient Stated Goal: Return to his home PT Goal Formulation: With patient/family Time For Goal Achievement: 05/12/22 Potential to Achieve Goals: Good Progress towards PT goals: Progressing toward goals    Frequency    Min 3X/week      PT Plan Current plan remains appropriate    Co-evaluation              AM-PAC PT "6 Clicks" Mobility   Outcome Measure  Help needed turning from your back to your side while in a flat bed without using bedrails?: A Little Help needed moving from lying on your back to sitting on the side of a flat bed without  using bedrails?: A Little Help needed moving to and from a bed to a chair (including a wheelchair)?: A Lot Help needed standing up from a chair using your arms (e.g., wheelchair or bedside chair)?: A Lot Help needed to walk in hospital room?: A Little Help needed climbing 3-5 steps with a railing? : A Little 6 Click Score: 16    End of Session Equipment Utilized During Treatment: Gait belt Activity Tolerance: Patient tolerated treatment well Patient left: in chair;with call bell/phone within reach;with chair alarm set Nurse Communication: Mobility status PT Visit Diagnosis: Unsteadiness on feet (R26.81);Other abnormalities of gait and mobility (R26.89);Difficulty in walking, not elsewhere classified (R26.2)     Time: 4403-4742 PT Time Calculation (min) (ACUTE ONLY): 30 min  Charges:  $Gait Training: 23-37 mins                     Melvyn Novas, MS, [place] [facility] Office: [phone]   Melvyn Novas 04/29/2022, 10:49 AM

## 2022-04-29 NOTE — Progress Notes (Signed)
Initial Nutrition Assessment  DOCUMENTATION CODES:   Severe malnutrition in context of chronic illness (component of social and environmental circumstances as well)  INTERVENTION:   Liberalize diet to REGULAR  Ensure Enlive po BID, each supplement provides 350 kcal and 20 grams of protein.  MVI with Minerals daily, Thiamine 100 mg x 7 days  NUTRITION DIAGNOSIS:   Severe Malnutrition related to social / environmental circumstances, chronic illness (COPD-continues to smoke, wife placed in memory care unit 08/2021 and pt with sig decline since) as evidenced by percent weight loss, severe muscle depletion, severe fat depletion.  GOAL:   Patient will meet greater than or equal to 90% of their needs  MONITOR:   PO intake, Supplement acceptance, Labs, Weight trends  REASON FOR ASSESSMENT:   Consult Assessment of nutrition requirement/status, Malnutrition Eval  ASSESSMENT:   81 yo male admitted with AMS, fall at home, acute COPD exacerbation. PMH includes depression, CAD, HTN, HLD, COPD  Recorded po intake 75-100% of meals. Pt eating side salad on visit today, sitting up on side of the bed.  Pt reports he lives at home with his Mother (?) who does all the cooking (reports she is an excellent cook) and he does the grocery shopping. Pt reports he eats 3 meals per day/ Pt reports he does work around the house including mowing the lawn with a riding Surveyor, mining.  However, per chart review this is not the case.   Psych notes indicate that per pt's daughter [name], the patient has been very depressed since wife was placed in memory care unit in November 2022. Pt's wife apparently did all of the cooking, cleaning and house work because the pt has hx of COPD and was very sedentary. After wife went to facility, pt would not bathe, eat or take care of himself. He would "sit in his recliner and smoke cigarettes all day, getting up only to use the bathroom." Pt's daughter and son would visit  him a total of 3x per day to take care of him.  Pt reports his UBW is around 135 pounds; current wt is 122.5 pounds (55.6 kg). Noted weight arouind 64-65 kg (142-143 pounds) in Jan of this year--14-15% wt loss; weight around 73 kg or higher in June 2021.  Vitamin Panel:  B12: 448 (low normal) B1: pending CRP: not available  Noted hypokalemia and hypophosphatemia, may be related to refeeding syndrome. Lytes being supplemented  Labs: potassium 2.8 (L), phosphorus 1.8 (L) Meds: K Phos neutral, KCl, prednisone   NUTRITION - FOCUSED PHYSICAL EXAM:  Flowsheet Row Most Recent Value  Orbital Region Moderate depletion  Upper Arm Region Severe depletion  Thoracic and Lumbar Region Severe depletion  Buccal Region Moderate depletion  Temple Region Severe depletion  Clavicle Bone Region Severe depletion  Clavicle and Acromion Bone Region Severe depletion  Dorsal Hand Mild depletion  Patellar Region Moderate depletion  Anterior Thigh Region Moderate depletion  Posterior Calf Region Severe depletion  Edema (RD Assessment) None  Skin --  [very dry, flaky]       Diet Order:   Diet Order             Diet Heart Room service appropriate? Yes; Fluid consistency: Thin  Diet effective now                   EDUCATION NEEDS:   Not appropriate for education at this time  Skin:  Skin Assessment: Reviewed RN Assessment  Last BM:  7/2  Height:  Ht Readings from Last 1 Encounters:  04/29/22 5\' 9"  (1.753 m)    Weight:   Wt Readings from Last 1 Encounters:  04/28/22 55.6 kg    BMI:  Body mass index is 18.09 kg/m.  Estimated Nutritional Needs:   Kcal:  1700-1900 kcals  Protein:  85-95 g  Fluid:  >/= 1.7 L  06/29/22 MS, RDN, LDN, CNSC Registered Dietitian III Clinical Nutrition RD Pager and On-Call Pager Number Located in [place]

## 2022-04-30 DIAGNOSIS — G9340 Encephalopathy, unspecified: Secondary | ICD-10-CM | POA: Diagnosis not present

## 2022-04-30 DIAGNOSIS — I442 Atrioventricular block, complete: Secondary | ICD-10-CM | POA: Diagnosis not present

## 2022-04-30 LAB — CBC WITH DIFFERENTIAL/PLATELET
Abs Immature Granulocytes: 0.02 10*3/uL (ref 0.00–0.07)
Basophils Absolute: 0 10*3/uL (ref 0.0–0.1)
Basophils Relative: 0 %
Eosinophils Absolute: 0 10*3/uL (ref 0.0–0.5)
Eosinophils Relative: 0 %
HCT: 35.6 % — ABNORMAL LOW (ref 39.0–52.0)
Hemoglobin: 11.8 g/dL — ABNORMAL LOW (ref 13.0–17.0)
Immature Granulocytes: 0 %
Lymphocytes Relative: 10 %
Lymphs Abs: 0.8 10*3/uL (ref 0.7–4.0)
MCH: 31.4 pg (ref 26.0–34.0)
MCHC: 33.1 g/dL (ref 30.0–36.0)
MCV: 94.7 fL (ref 80.0–100.0)
Monocytes Absolute: 0.5 10*3/uL (ref 0.1–1.0)
Monocytes Relative: 7 %
Neutro Abs: 6.6 10*3/uL (ref 1.7–7.7)
Neutrophils Relative %: 83 %
Platelets: 222 10*3/uL (ref 150–400)
RBC: 3.76 MIL/uL — ABNORMAL LOW (ref 4.22–5.81)
RDW: 16.1 % — ABNORMAL HIGH (ref 11.5–15.5)
WBC: 7.9 10*3/uL (ref 4.0–10.5)
nRBC: 0 % (ref 0.0–0.2)

## 2022-04-30 LAB — COMPREHENSIVE METABOLIC PANEL
ALT: 23 U/L (ref 0–44)
AST: 43 U/L — ABNORMAL HIGH (ref 15–41)
Albumin: 2.1 g/dL — ABNORMAL LOW (ref 3.5–5.0)
Alkaline Phosphatase: 69 U/L (ref 38–126)
Anion gap: 9 (ref 5–15)
BUN: 17 mg/dL (ref 8–23)
CO2: 32 mmol/L (ref 22–32)
Calcium: 8.6 mg/dL — ABNORMAL LOW (ref 8.9–10.3)
Chloride: 97 mmol/L — ABNORMAL LOW (ref 98–111)
Creatinine, Ser: 0.91 mg/dL (ref 0.61–1.24)
GFR, Estimated: >60 mL/min (ref >60)
Glucose, Bld: 141 mg/dL — ABNORMAL HIGH (ref 70–99)
Potassium: 3.1 mmol/L — ABNORMAL LOW (ref 3.5–5.1)
Sodium: 138 mmol/L (ref 135–145)
Total Bilirubin: 0.4 mg/dL (ref 0.3–1.2)
Total Protein: 5.1 g/dL — ABNORMAL LOW (ref 6.5–8.1)

## 2022-04-30 LAB — URINE CULTURE

## 2022-04-30 LAB — PHOSPHORUS: Phosphorus: 2.2 mg/dL — ABNORMAL LOW (ref 2.5–4.6)

## 2022-04-30 LAB — MAGNESIUM: Magnesium: 1.9 mg/dL (ref 1.7–2.4)

## 2022-04-30 MED ORDER — POTASSIUM CHLORIDE CRYS ER 20 MEQ PO TBCR
40.0000 meq | EXTENDED_RELEASE_TABLET | ORAL | Status: AC
  Administered 2022-04-30 (×2): 40 meq via ORAL
  Filled 2022-04-30 (×2): qty 2

## 2022-04-30 NOTE — Progress Notes (Signed)
PROGRESS NOTE    [name]  YHC:[id number] DOB: 1941-10-19 DOA: 04/27/2022 PCP: Etta Grandchild, MD  Chief Complaint  Patient presents with   Code Stroke    Brief Narrative:  [name] is [name] 81 y.o. male with medical history significant of COPD Gold stage III, chronic hypoxic failure, noncompliant with home O2 and COPD medications, HTN, HLD, CAD status post stenting, cigarette smoke, presented with fall and altered mentations.   He was seen by neurology due to suspected stroke.  Symptoms thought possibly related to fall related to COPD exacerbation (confusion related to hypoxia or hypercarbia -> none documented on blood gas).  He had 7 second pause and EP is planning for pacemaker placement on 7/6.  See below for additional details    Assessment & Plan:   Principal Problem:   Encephalopathy Active Problems:   Complete heart block (HCC)   Acute metabolic encephalopathy   COPD with acute exacerbation (HCC)   Fall at home, initial encounter   Stroke-like symptoms   Hypokalemia   Hematuria   Hyperkalemia   AKI (acute kidney injury) (HCC)   Protein-calorie malnutrition, severe (HCC)   Essential hypertension   Depression   Tobacco abuse   Hypertrophic toenail   Acute encephalopathy   Assessment and Plan: Complete heart block (HCC) 7 second pause on telemetry noted 7/3 He's asymptomatic EKG sinus Cardiology c/s, appreciate recommendations -> pacemaker placement planned 7/6  Acute metabolic encephalopathy Resolved at this point, at admission thought due to COPD exacerbation He's improved now, with treatment Delirium precautions B12, TSH wnl.  VBG without hypercarbia Pending B1 zyprexa daily prn per psych   Stroke-like symptoms Code stroke was called on admission, thought to have mild R sided motor deficits, mild ataxia/cognitive/communication deficits  Neurology recommended stroke workup MRI brain without acute abnormality. 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      COPD with acute exacerbation (HCC) Wheezing noted on admission, continues to improve at this time Continue scheduled and prn nebs Steroids, abx  Hyperkalemia Resolved, now hypokalemic  Hematuria Follow urine cx - multiple species, needs additional w/u outpatient with urology  Hypokalemia Replace and follow  AKI (acute kidney injury) (HCC) Dehydration Improved with IVF  Protein-calorie malnutrition, severe (HCC) noted  Essential hypertension HCTZ  Depression Psychiatry c/s Oral thiamine, olanzapine daily prn agitation   Tobacco abuse Encourage cessation Nicotine patch  Hypertrophic toenail Needs podiatry follow up     DVT prophylaxis: lovenox Code Status: DNR Family Communication: son 7/4 Disposition:   Status is: Inpatient Remains inpatient appropriate because: need for therapy eval and safe d/c plan   Consultants:  Psych neurology  Procedures:  Echo IMPRESSIONS     1. Left ventricular ejection fraction, by estimation, is 60 to 65%. The  left ventricle has normal function. The left ventricle has no regional  wall motion abnormalities. There is mild left ventricular hypertrophy.  Left ventricular diastolic parameters  are consistent with Grade II diastolic dysfunction (  pseudonormalization).   2. Right ventricular systolic function is normal. The right ventricular   size is normal.   3. The mitral valve is normal in structure. Mild mitral valve  regurgitation. No evidence of mitral stenosis. Moderate mitral annular  calcification.   4. The aortic valve is tricuspid. Aortic valve regurgitation is not  visualized. Aortic valve sclerosis is present, with no evidence of aortic  valve stenosis.   5. The inferior vena cava is normal in size with greater than 50%  respiratory variability, suggesting right atrial pressure of 3 mmHg.   Antimicrobials:  Anti-infectives (From admission, onward)    Start     Dose/Rate Route Frequency Ordered Stop   04/28/22 2200  doxycycline (VIBRA-TABS) tablet 100 mg        100 mg Oral Every 12 hours 04/28/22 1521         Subjective: No complaints  Objective: Vitals:   04/30/22 0400 04/30/22 0720 04/30/22 0724 04/30/22 1110  BP: 130/61  135/76 129/65  Pulse: 65 66 (!) 55 79  Resp: 18 16 14 14   Temp: 97.9 F (36.6 C)  97.6 F (36.4 C) 97.9 F (36.6 C)  TempSrc: Oral  Oral Oral  SpO2: 96% 96% 100% (!) 77%  Weight:      Height:      Unclear whether 77% spo2 sat is accurate, will follow with RN  Intake/Output Summary (Last 24 hours) at 04/30/2022 1455 Last data filed at 04/30/2022 1300 Gross per 24 hour  Intake 243 ml  Output 1600 ml  Net -1357 ml    Filed Weights   04/27/22 1136 04/28/22 0030  Weight: 57.6 kg 55.6 kg    Examination:  General: No acute distress. Cardiovascular: RRR Lungs: unlabored on RA Abdomen: Soft, nontender, nondistended Neurological: Alert and oriented 3. Moves all extremities 4. Cranial nerves II through XII grossly intact. Extremities: No clubbing or cyanosis. No edema.   Data Reviewed: I have personally reviewed following labs and imaging studies  CBC: Recent Labs  Lab 04/27/22 1138 04/27/22 1143 04/28/22 0307 04/29/22 0227 04/30/22 0052  WBC  --  5.3 4.9 7.8 7.9  NEUTROABS  --  4.3  --  5.7 6.6  HGB 16.0 13.9 11.9* 11.3* 11.8*  HCT 47.0 41.9 35.6* 34.3* 35.6*   MCV  --  94.2 93.0 94.2 94.7  PLT  --  242 172 220 [id number]    Basic Metabolic Panel: Recent Labs  Lab 04/27/22 1138 04/27/22 1143 04/28/22 0027 04/29/22 0227 04/30/22 0052  NA 134* 137 135 138 138  K 5.5* 5.5* 3.8 2.8* 3.1*  CL 97* 96* 106 98 97*  CO2  --  27 19* 31 32  GLUCOSE 91 96 129* 150* 141*  BUN 23 15 5* 23 17  CREATININE 1.10 1.17 0.65 1.13 0.91  CALCIUM  --  8.4* 9.1 8.4* 8.6*  MG  --   --   --  2.0 1.9  PHOS  --   --   --  1.8* 2.2*    GFR: Estimated Creatinine Clearance: 49.2 mL/min (by C-G formula based on SCr of 0.91 mg/dL).  Liver Function Tests: Recent Labs  Lab 04/27/22 1143 04/28/22 0027 04/29/22 0227 04/30/22 0052  AST 41  --  41 43*  ALT 27  --  18 23  ALKPHOS 84  --  70 69  BILITOT 1.9*  --  0.4 0.4  PROT 5.5*  --  4.5* 5.1*  ALBUMIN 2.2* 2.8* 2.0* 2.1*    CBG: Recent  Labs  Lab 04/27/22 1132  GLUCAP 91     Recent Results (from the past 240 hour(s))  Resp Panel by RT-PCR (Flu Lydon Vansickle&B, Covid) Anterior Nasal Swab     Status: None   Collection Time: 04/27/22  8:25 PM   Specimen: Anterior Nasal Swab  Result Value Ref Range Status   SARS Coronavirus 2 by RT PCR NEGATIVE NEGATIVE Final    Comment: (NOTE) SARS-CoV-2 target nucleic acids are NOT DETECTED.  The SARS-CoV-2 RNA is generally detectable in upper respiratory specimens during the acute phase of infection. The lowest concentration of SARS-CoV-2 viral copies this assay can detect is 138 copies/mL. Bertie Simien negative result does not preclude SARS-Cov-2 infection and should not be used as the sole basis for treatment or other patient management decisions. Valkyrie Guardiola negative result may occur with  improper specimen collection/handling, submission of specimen other than nasopharyngeal swab, presence of viral mutation(s) within the areas targeted by this assay, and inadequate number of viral copies(<138 copies/mL). Kailynn Satterly negative result must be combined with clinical observations, patient history, and  epidemiological information. The expected result is Negative.  Fact Sheet for Patients:  BloggerCourse.com  Fact Sheet for Healthcare Providers:  SeriousBroker.it  This test is no t yet approved or cleared by the [place] FDA and  has been authorized for detection and/or diagnosis of SARS-CoV-2 by FDA under an Emergency Use Authorization (EUA). This EUA will remain  in effect (meaning this test can be used) for the duration of the COVID-19 declaration under Section 564(b)(1) of the Act, 21 U.S.C.section 360bbb-3(b)(1), unless the authorization is terminated  or revoked sooner.       Influenza Careen Mauch by PCR NEGATIVE NEGATIVE Final   Influenza B by PCR NEGATIVE NEGATIVE Final    Comment: (NOTE) The Xpert Xpress SARS-CoV-2/FLU/RSV plus assay is intended as an aid in the diagnosis of influenza from Nasopharyngeal swab specimens and should not be used as Lakyn Alsteen sole basis for treatment. Nasal washings and aspirates are unacceptable for Xpert Xpress SARS-CoV-2/FLU/RSV testing.  Fact Sheet for Patients: BloggerCourse.com  Fact Sheet for Healthcare Providers: SeriousBroker.it  This test is not yet approved or cleared by the [place] FDA and has been authorized for detection and/or diagnosis of SARS-CoV-2 by FDA under an Emergency Use Authorization (EUA). This EUA will remain in effect (meaning this test can be used) for the duration of the COVID-19 declaration under Section 564(b)(1) of the Act, 21 U.S.C. section 360bbb-3(b)(1), unless the authorization is terminated or revoked.  Performed at [facility], [address], [place], [place] [address]   Urine Culture     Status: Abnormal   Collection Time: 04/28/22  7:43 AM   Specimen: Urine, Clean Catch  Result Value Ref Range Status   Specimen Description URINE, CLEAN CATCH  Final   Special Requests   Final     NONE Performed at [facility], [address], [place], [place] [address]    Culture MULTIPLE SPECIES PRESENT, SUGGEST RECOLLECTION (Danie Diehl)  Final   Report Status 04/30/2022 FINAL  Final         Radiology Studies: ECHOCARDIOGRAM COMPLETE  Result Date: 04/28/2022    ECHOCARDIOGRAM REPORT   Patient Name:   [name] Date of Exam: 04/28/2022 Medical Rec #:  [id number]     Height:       71.0 in Accession #:    [id number]    Weight:       122.5 lb Date of Birth:  [birth year]  BSA:          1.712 m Patient Age:    7 years      BP:           121/67 mmHg Patient Gender: M             HR:           55 bpm. Exam Location:  Inpatient Procedure: 2D Echo, Color Doppler and Cardiac Doppler Indications:    Stroke i63.9  History:        Patient has prior history of Echocardiogram examinations, most                 recent 01/23/2017. COPD; Risk Factors:Hypertension and                 Dyslipidemia.  Sonographer:    Raquel Sarna Senior RDCS Referring Phys: EO:[id number] [place]  1. Left ventricular ejection fraction, by estimation, is 60 to 65%. The left ventricle has normal function. The left ventricle has no regional wall motion abnormalities. There is mild left ventricular hypertrophy. Left ventricular diastolic parameters are consistent with Grade II diastolic dysfunction (pseudonormalization).  2. Right ventricular systolic function is normal. The right ventricular size is normal.  3. The mitral valve is normal in structure. Mild mitral valve regurgitation. No evidence of mitral stenosis. Moderate mitral annular calcification.  4. The aortic valve is tricuspid. Aortic valve regurgitation is not visualized. Aortic valve sclerosis is present, with no evidence of aortic valve stenosis.  5. The inferior vena cava is normal in size with greater than 50% respiratory variability, suggesting right atrial pressure of 3 mmHg. FINDINGS  Left Ventricle: Left ventricular ejection fraction, by estimation, is 60 to 65%.  The left ventricle has normal function. The left ventricle has no regional wall motion abnormalities. The left ventricular internal cavity size was normal in size. There is  mild left ventricular hypertrophy. Left ventricular diastolic parameters are consistent with Grade II diastolic dysfunction (pseudonormalization). Right Ventricle: The right ventricular size is normal. Right ventricular systolic function is normal. Left Atrium: Left atrial size was normal in size. Right Atrium: Right atrial size was normal in size. Pericardium: There is no evidence of pericardial effusion. Mitral Valve: The mitral valve is normal in structure. Moderate mitral annular calcification. Mild mitral valve regurgitation. No evidence of mitral valve stenosis. Tricuspid Valve: The tricuspid valve is normal in structure. Tricuspid valve regurgitation is mild . No evidence of tricuspid stenosis. Aortic Valve: The aortic valve is tricuspid. Aortic valve regurgitation is not visualized. Aortic valve sclerosis is present, with no evidence of aortic valve stenosis. Pulmonic Valve: The pulmonic valve was normal in structure. Pulmonic valve regurgitation is trivial. No evidence of pulmonic stenosis. Aorta: The aortic root is normal in size and structure. Venous: The inferior vena cava is normal in size with greater than 50% respiratory variability, suggesting right atrial pressure of 3 mmHg. IAS/Shunts: No atrial level shunt detected by color flow Doppler.  LEFT VENTRICLE PLAX 2D LVIDd:         4.55 cm   Diastology LVIDs:         3.10 cm   LV e' medial:    7.18 cm/s LV PW:         1.15 cm   LV E/e' medial:  12.5 LV IVS:        1.05 cm   LV e' lateral:   7.40 cm/s LVOT diam:     1.95 cm   LV  E/e' lateral: 12.2 LV SV:         80 LV SV Index:   47 LVOT Area:     2.99 cm  RIGHT VENTRICLE RV S prime:     12.50 cm/s TAPSE (M-mode): 2.0 cm LEFT ATRIUM             Index        RIGHT ATRIUM           Index LA diam:        3.80 cm 2.22 cm/m   RA Area:      14.40 cm LA Vol (A2C):   40.5 ml 23.66 ml/m  RA Volume:   34.00 ml  19.86 ml/m LA Vol (A4C):   34.5 ml 20.15 ml/m LA Biplane Vol: 40.7 ml 23.77 ml/m  AORTIC VALVE LVOT Vmax:   106.00 cm/s LVOT Vmean:  78.200 cm/s LVOT VTI:    0.267 m  AORTA Ao Root diam: 3.25 cm MITRAL VALVE MV Area (PHT): 3.58 cm    SHUNTS MV Decel Time: 212 msec    Systemic VTI:  0.27 m MV E velocity: 90.10 cm/s  Systemic Diam: 1.95 cm MV Jevante George velocity: 79.70 cm/s MV E/Sair Faulcon ratio:  1.13 Kirk Ruths MD Electronically signed by Kirk Ruths MD Signature Date/Time: 04/28/2022/4:04:41 PM    Final         Scheduled Meds:  aspirin EC  81 mg Oral Daily   doxycycline  100 mg Oral Q12H   enoxaparin (LOVENOX) injection  40 mg Subcutaneous Q24H   ezetimibe  10 mg Oral Daily   feeding supplement  237 mL Oral BID BM   hydrocerin   Topical BID   hydrochlorothiazide  12.5 mg Oral Daily   mometasone-formoterol  2 puff Inhalation BID   multivitamin with minerals  1 tablet Oral Daily   nicotine  21 mg Transdermal Daily   predniSONE  40 mg Oral Q breakfast   rosuvastatin  40 mg Oral Daily   sodium chloride flush  3 mL Intravenous Q12H   thiamine  100 mg Oral Daily   Continuous Infusions:   LOS: 3 days    Time spent: over 30 min     Fayrene Helper, MD [facility]   To contact the attending provider between 7A-7P or the covering provider during after hours 7P-7A, please log into the web site [email or web address] and access using universal [place] password for that web site. If you do not have the password, please call the hospital operator.  04/30/2022, 2:55 PM

## 2022-04-30 NOTE — Progress Notes (Signed)
EP Progress Note  Patient Name: [name] Date of Encounter: 04/30/2022  [facility] Cardiologist: Olga Millers, MD   Subjective   NAEO. Feels well this morning.  Inpatient Medications    Scheduled Meds:  albuterol  2.5 mg Nebulization TID   aspirin EC  81 mg Oral Daily   doxycycline  100 mg Oral Q12H   enoxaparin (LOVENOX) injection  40 mg Subcutaneous Q24H   ezetimibe  10 mg Oral Daily   feeding supplement  237 mL Oral BID BM   hydrocerin   Topical BID   hydrochlorothiazide  12.5 mg Oral Daily   mometasone-formoterol  2 puff Inhalation BID   multivitamin with minerals  1 tablet Oral Daily   nicotine  21 mg Transdermal Daily   potassium chloride  40 mEq Oral Q4H   predniSONE  40 mg Oral Q breakfast   rosuvastatin  40 mg Oral Daily   sodium chloride flush  3 mL Intravenous Q12H   thiamine  100 mg Oral Daily   Continuous Infusions:  PRN Meds: albuterol, bisacodyl, nicotine polacrilex, OLANZapine **OR** OLANZapine, ondansetron **OR** ondansetron (ZOFRAN) IV   Vital Signs    Vitals:   04/29/22 2355 04/30/22 0400 04/30/22 0720 04/30/22 0724  BP: 140/81 130/61  135/76  Pulse: 76 65 66 (!) 55  Resp: 18 18 16 14   Temp: 97.7 F (36.5 C) 97.9 F (36.6 C)  97.6 F (36.4 C)  TempSrc: Oral Oral  Oral  SpO2: 94% 96% 96% 100%  Weight:      Height:        Intake/Output Summary (Last 24 hours) at 04/30/2022 0750 Last data filed at 04/29/2022 2135 Gross per 24 hour  Intake 3 ml  Output 600 ml  Net -597 ml      04/28/2022   12:30 AM 04/27/2022   11:36 AM 11/06/2021    2:51 PM  Last 3 Weights  Weight (lbs) 122 lb 8 oz 126 lb 15.8 oz 141 lb 12.8 oz  Weight (kg) 55.566 kg 57.6 kg 64.32 kg      Telemetry    No pauses or CHB overnight. -  Personally Reviewed  ECG    Personally Reviewed  Physical Exam   GEN: No acute distress.  Elderly in bed at 15 degrees. Neck: No JVD Cardiac: RRR, no murmurs, rubs, or gallops.  Respiratory: Clear to auscultation  bilaterally. GI: Soft, nontender, non-distended  MS: No edema; No deformity. Neuro:  Nonfocal  Psych: Normal affect   Labs    High Sensitivity Troponin:  No results for input(s): "TROPONINIHS" in the last 720 hours.   Chemistry Recent Labs  Lab 04/27/22 1143 04/28/22 0027 04/29/22 0227 04/30/22 0052  NA 137 135 138 138  K 5.5* 3.8 2.8* 3.1*  CL 96* 106 98 97*  CO2 27 19* 31 32  GLUCOSE 96 129* 150* 141*  BUN 15 5* 23 17  CREATININE 1.17 0.65 1.13 0.91  CALCIUM 8.4* 9.1 8.4* 8.6*  MG  --   --  2.0 1.9  PROT 5.5*  --  4.5* 5.1*  ALBUMIN 2.2* 2.8* 2.0* 2.1*  AST 41  --  41 43*  ALT 27  --  18 23  ALKPHOS 84  --  70 69  BILITOT 1.9*  --  0.4 0.4  GFRNONAA >60 >60 >60 >60  ANIONGAP 14 10 9 9     Lipids No results for input(s): "CHOL", "TRIG", "HDL", "LABVLDL", "LDLCALC", "CHOLHDL" in the last 168 hours.  Hematology  Recent Labs  Lab 04/28/22 0307 04/29/22 0227 04/30/22 0052  WBC 4.9 7.8 7.9  RBC 3.83* 3.64* 3.76*  HGB 11.9* 11.3* 11.8*  HCT 35.6* 34.3* 35.6*  MCV 93.0 94.2 94.7  MCH 31.1 31.0 31.4  MCHC 33.4 32.9 33.1  RDW 16.0* 16.3* 16.1*  PLT 172 220 222   Thyroid  Recent Labs  Lab 04/28/22 0307  TSH 0.878    BNPNo results for input(s): "BNP", "PROBNP" in the last 168 hours.  DDimer No results for input(s): "DDIMER" in the last 168 hours.   Radiology    ECHOCARDIOGRAM COMPLETE  Result Date: 04/28/2022    ECHOCARDIOGRAM REPORT   Patient Name:   [name] [facility] Date of Exam: 04/28/2022 Medical Rec #:  [id number]     Height:       71.0 in Accession #:    [id number]    Weight:       122.5 lb Date of Birth:  [birth year]    BSA:          1.712 m Patient Age:    81 years      BP:           121/67 mmHg Patient Gender: M             HR:           55 bpm. Exam Location:  Inpatient Procedure: 2D Echo, Color Doppler and Cardiac Doppler Indications:    Stroke i63.9  History:        Patient has prior history of Echocardiogram examinations, most                 recent  01/23/2017. COPD; Risk Factors:Hypertension and                 Dyslipidemia.  Sonographer:    Irving Burton Senior RDCS Referring Phys: [id number] DEVON SHAFER IMPRESSIONS  1. Left ventricular ejection fraction, by estimation, is 60 to 65%. The left ventricle has normal function. The left ventricle has no regional wall motion abnormalities. There is mild left ventricular hypertrophy. Left ventricular diastolic parameters are consistent with Grade II diastolic dysfunction (pseudonormalization).  2. Right ventricular systolic function is normal. The right ventricular size is normal.  3. The mitral valve is normal in structure. Mild mitral valve regurgitation. No evidence of mitral stenosis. Moderate mitral annular calcification.  4. The aortic valve is tricuspid. Aortic valve regurgitation is not visualized. Aortic valve sclerosis is present, with no evidence of aortic valve stenosis.  5. The inferior vena cava is normal in size with greater than 50% respiratory variability, suggesting right atrial pressure of 3 mmHg. FINDINGS  Left Ventricle: Left ventricular ejection fraction, by estimation, is 60 to 65%. The left ventricle has normal function. The left ventricle has no regional wall motion abnormalities. The left ventricular internal cavity size was normal in size. There is  mild left ventricular hypertrophy. Left ventricular diastolic parameters are consistent with Grade II diastolic dysfunction (pseudonormalization). Right Ventricle: The right ventricular size is normal. Right ventricular systolic function is normal. Left Atrium: Left atrial size was normal in size. Right Atrium: Right atrial size was normal in size. Pericardium: There is no evidence of pericardial effusion. Mitral Valve: The mitral valve is normal in structure. Moderate mitral annular calcification. Mild mitral valve regurgitation. No evidence of mitral valve stenosis. Tricuspid Valve: The tricuspid valve is normal in structure. Tricuspid valve  regurgitation is mild . No evidence of tricuspid stenosis. Aortic Valve: The aortic valve is tricuspid.  Aortic valve regurgitation is not visualized. Aortic valve sclerosis is present, with no evidence of aortic valve stenosis. Pulmonic Valve: The pulmonic valve was normal in structure. Pulmonic valve regurgitation is trivial. No evidence of pulmonic stenosis. Aorta: The aortic root is normal in size and structure. Venous: The inferior vena cava is normal in size with greater than 50% respiratory variability, suggesting right atrial pressure of 3 mmHg. IAS/Shunts: No atrial level shunt detected by color flow Doppler.  LEFT VENTRICLE PLAX 2D LVIDd:         4.55 cm   Diastology LVIDs:         3.10 cm   LV e' medial:    7.18 cm/s LV PW:         1.15 cm   LV E/e' medial:  12.5 LV IVS:        1.05 cm   LV e' lateral:   7.40 cm/s LVOT diam:     1.95 cm   LV E/e' lateral: 12.2 LV SV:         80 LV SV Index:   47 LVOT Area:     2.99 cm  RIGHT VENTRICLE RV S prime:     12.50 cm/s TAPSE (M-mode): 2.0 cm LEFT ATRIUM             Index        RIGHT ATRIUM           Index LA diam:        3.80 cm 2.22 cm/m   RA Area:     14.40 cm LA Vol (A2C):   40.5 ml 23.66 ml/m  RA Volume:   34.00 ml  19.86 ml/m LA Vol (A4C):   34.5 ml 20.15 ml/m LA Biplane Vol: 40.7 ml 23.77 ml/m  AORTIC VALVE LVOT Vmax:   106.00 cm/s LVOT Vmean:  78.200 cm/s LVOT VTI:    0.267 m  AORTA Ao Root diam: 3.25 cm MITRAL VALVE MV Area (PHT): 3.58 cm    SHUNTS MV Decel Time: 212 msec    Systemic VTI:  0.27 m MV E velocity: 90.10 cm/s  Systemic Diam: 1.95 cm MV A velocity: 79.70 cm/s MV E/A ratio:  1.13 Kirk Ruths MD Electronically signed by Kirk Ruths MD Signature Date/Time: 04/28/2022/4:04:41 PM    Final     Cardiac Studies     Assessment & Plan    Mr. Pier is a pleasant 81 year old man with a history of peripheral vascular disease, coronary artery disease, hypertension, hyperlipidemia, COPD who I am seeing for syncope and complete heart  block  #Paroxysmal CHB #Syncope He will require permanent pacing.  Tentatively planning for Thursday afternoon.  I would like him to be rehydrated and his electrolytes be stable prior to pacemaker implant.   Keep K>4, Mg>2. Stop lovenox today.  Plan for [employer] DDD PPM with left bundle area lead.  Consented 04/29/2022.  For questions or updates, please contact [place] Please consult [email or web address] for contact info under        Signed, Vickie Epley, MD  04/30/2022, 7:50 AM

## 2022-04-30 NOTE — Assessment & Plan Note (Signed)
Replace and follow. ?

## 2022-05-01 DIAGNOSIS — E875 Hyperkalemia: Secondary | ICD-10-CM

## 2022-05-01 DIAGNOSIS — E43 Unspecified severe protein-calorie malnutrition: Secondary | ICD-10-CM

## 2022-05-01 DIAGNOSIS — Z720 Tobacco use: Secondary | ICD-10-CM

## 2022-05-01 DIAGNOSIS — L89152 Pressure ulcer of sacral region, stage 2: Secondary | ICD-10-CM

## 2022-05-01 DIAGNOSIS — J441 Chronic obstructive pulmonary disease with (acute) exacerbation: Secondary | ICD-10-CM

## 2022-05-01 DIAGNOSIS — I10 Essential (primary) hypertension: Secondary | ICD-10-CM

## 2022-05-01 DIAGNOSIS — L602 Onychogryphosis: Secondary | ICD-10-CM

## 2022-05-01 DIAGNOSIS — I442 Atrioventricular block, complete: Secondary | ICD-10-CM | POA: Diagnosis not present

## 2022-05-01 DIAGNOSIS — E876 Hypokalemia: Secondary | ICD-10-CM

## 2022-05-01 DIAGNOSIS — L8990 Pressure ulcer of unspecified site, unspecified stage: Secondary | ICD-10-CM | POA: Insufficient documentation

## 2022-05-01 DIAGNOSIS — G9341 Metabolic encephalopathy: Secondary | ICD-10-CM

## 2022-05-01 DIAGNOSIS — N179 Acute kidney failure, unspecified: Secondary | ICD-10-CM

## 2022-05-01 DIAGNOSIS — W19XXXA Unspecified fall, initial encounter: Secondary | ICD-10-CM

## 2022-05-01 DIAGNOSIS — G9340 Encephalopathy, unspecified: Secondary | ICD-10-CM | POA: Diagnosis not present

## 2022-05-01 DIAGNOSIS — Y92009 Unspecified place in unspecified non-institutional (private) residence as the place of occurrence of the external cause: Secondary | ICD-10-CM

## 2022-05-01 LAB — COMPREHENSIVE METABOLIC PANEL
ALT: 33 U/L (ref 0–44)
AST: 52 U/L — ABNORMAL HIGH (ref 15–41)
Albumin: 2.2 g/dL — ABNORMAL LOW (ref 3.5–5.0)
Alkaline Phosphatase: 72 U/L (ref 38–126)
Anion gap: 5 (ref 5–15)
BUN: 14 mg/dL (ref 8–23)
CO2: 34 mmol/L — ABNORMAL HIGH (ref 22–32)
Calcium: 8.5 mg/dL — ABNORMAL LOW (ref 8.9–10.3)
Chloride: 97 mmol/L — ABNORMAL LOW (ref 98–111)
Creatinine, Ser: 0.92 mg/dL (ref 0.61–1.24)
GFR, Estimated: >60 mL/min (ref >60)
Glucose, Bld: 122 mg/dL — ABNORMAL HIGH (ref 70–99)
Potassium: 3.9 mmol/L (ref 3.5–5.1)
Sodium: 136 mmol/L (ref 135–145)
Total Bilirubin: 0.3 mg/dL (ref 0.3–1.2)
Total Protein: 5.2 g/dL — ABNORMAL LOW (ref 6.5–8.1)

## 2022-05-01 LAB — CBC WITH DIFFERENTIAL/PLATELET
Abs Immature Granulocytes: 0.03 10*3/uL (ref 0.00–0.07)
Basophils Absolute: 0 10*3/uL (ref 0.0–0.1)
Basophils Relative: 0 %
Eosinophils Absolute: 0 10*3/uL (ref 0.0–0.5)
Eosinophils Relative: 0 %
HCT: 36.8 % — ABNORMAL LOW (ref 39.0–52.0)
Hemoglobin: 12.2 g/dL — ABNORMAL LOW (ref 13.0–17.0)
Immature Granulocytes: 0 %
Lymphocytes Relative: 12 %
Lymphs Abs: 1.1 10*3/uL (ref 0.7–4.0)
MCH: 31.3 pg (ref 26.0–34.0)
MCHC: 33.2 g/dL (ref 30.0–36.0)
MCV: 94.4 fL (ref 80.0–100.0)
Monocytes Absolute: 0.8 10*3/uL (ref 0.1–1.0)
Monocytes Relative: 8 %
Neutro Abs: 7.1 10*3/uL (ref 1.7–7.7)
Neutrophils Relative %: 80 %
Platelets: 220 10*3/uL (ref 150–400)
RBC: 3.9 MIL/uL — ABNORMAL LOW (ref 4.22–5.81)
RDW: 15.9 % — ABNORMAL HIGH (ref 11.5–15.5)
WBC: 8.9 10*3/uL (ref 4.0–10.5)
nRBC: 0 % (ref 0.0–0.2)

## 2022-05-01 LAB — MAGNESIUM: Magnesium: 1.9 mg/dL (ref 1.7–2.4)

## 2022-05-01 LAB — PHOSPHORUS: Phosphorus: 1.6 mg/dL — ABNORMAL LOW (ref 2.5–4.6)

## 2022-05-01 MED ORDER — POTASSIUM PHOSPHATES 15 MMOLE/5ML IV SOLN
30.0000 mmol | Freq: Once | INTRAVENOUS | Status: AC
  Administered 2022-05-01: 30 mmol via INTRAVENOUS
  Filled 2022-05-01: qty 10

## 2022-05-01 NOTE — Progress Notes (Signed)
PT Cancellation Note  Patient Details Name: SEVAG SHEARN MRN: [id number] DOB: [birth year]   Cancelled Treatment:    Reason Eval/Treat Not Completed: (P) Patient declined, no reason specified (Pt sleeping soundly on arrival.  Attempted to wake and encourage PT tx.  Pt refusing at this time.  Willl f/u per POC.)   Shigeo Baugh Artis Delay 05/01/2022, 3:48 PM Bonney Leitz , PTA Acute Rehabilitation Services Office [phone]

## 2022-05-01 NOTE — Progress Notes (Signed)
Occupational Therapy Treatment Patient Details Name: [name] MRN: [id number] DOB: 1941-04-22 Today's Date: 05/01/2022   History of present illness Pt is an 81 y/o male who presents 04/27/22 after being found down on the garage floor, awake and confused. MRI negative for acute changes. PMH significant for asthma, CAD, essential HTN, heart murmur, MI, R inguinal hernia s/p repair 11/01/2016, ventral hernia repair, ORIF shoulder fracture, multiple falls.   OT comments  Pt making slow progress toward goals, requiring min guard - min A for standing ADLs/transfers. Pt max A for LB dressing due to poor sitting balance, discussed use of AE for LB dressing, however pt declining stating that "he'll manage". Pt with posterior lean standing at sink without BUE support, needing cues and min-mod A to remain upright, pt unaware when asked. Pt presenting with impairments listed below, will follow acutely. Recommend HHOT at d/c.   Recommendations for follow up therapy are one component of a multi-disciplinary discharge planning process, led by the attending physician.  Recommendations may be updated based on patient status, additional functional criteria and insurance authorization.    Follow Up Recommendations  Home health OT    Assistance Recommended at Discharge Frequent or constant Supervision/Assistance  Patient can return home with the following  A little help with walking and/or transfers;A lot of help with bathing/dressing/bathroom;Assistance with cooking/housework;Direct supervision/assist for medications management;Direct supervision/assist for financial management;Assist for transportation;Help with stairs or ramp for entrance   Equipment Recommendations  BSC/3in1    Recommendations for Other Services      Precautions / Restrictions Precautions Precautions: Fall Restrictions Weight Bearing Restrictions: No       Mobility Bed Mobility Overal bed mobility: Needs Assistance Bed Mobility:  Supine to Sit     Supine to sit: HOB elevated, Min assist, Mod assist     General bed mobility comments: for trunk elevation    Transfers Overall transfer level: Needs assistance Equipment used: Rolling walker (2 wheels) Transfers: Sit to/from Stand Sit to Stand: Min assist           General transfer comment: cues to keep RW close to body/not abandon RW during session     Balance Overall balance assessment: Needs assistance Sitting-balance support: Feet supported, No upper extremity supported Sitting balance-Leahy Scale: Poor   Postural control: Right lateral lean, Posterior lean Standing balance support: Bilateral upper extremity supported, During functional activity, Reliant on assistive device for balance Standing balance-Leahy Scale: Poor Standing balance comment: Posterior lean                           ADL either performed or assessed with clinical judgement   ADL Overall ADL's : Needs assistance/impaired     Grooming: Min guard;Wash/dry face;Standing Grooming Details (indicate cue type and reason): to wash face             Lower Body Dressing: Maximal assistance;Sitting/lateral leans Lower Body Dressing Details (indicate cue type and reason): unable to perform due to poor sitting balance Toilet Transfer: Minimal assistance;Rolling walker (2 wheels);Ambulation Toilet Transfer Details (indicate cue type and reason): simulated in room         Functional mobility during ADLs: Minimal assistance;Rolling walker (2 wheels)      Extremity/Trunk Assessment Upper Extremity Assessment Upper Extremity Assessment: Generalized weakness RUE Deficits / Details: ~25% shoulder flexion, weak grasp, son reports hx of shoulder injury RUE Coordination: decreased fine motor;decreased gross motor   Lower Extremity Assessment Lower Extremity Assessment: Defer  to PT evaluation        Vision   Vision Assessment?: No apparent visual deficits   Perception  Perception Perception: Not tested   Praxis Praxis Praxis: Not tested    Cognition Arousal/Alertness: Awake/alert Behavior During Therapy: Flat affect Overall Cognitive Status: Impaired/Different from baseline Area of Impairment: Orientation, Attention, Following commands, Memory, Safety/judgement, Awareness, Problem solving                   Current Attention Level: Sustained Memory: Decreased short-term memory Following Commands: Follows one step commands consistently, Follows one step commands with increased time, Follows multi-step commands inconsistently Safety/Judgement: Decreased awareness of safety, Decreased awareness of deficits Awareness: Intellectual Problem Solving: Slow processing, Difficulty sequencing, Decreased initiation, Requires verbal cues          Exercises      Shoulder Instructions       General Comments VSS on RA    Pertinent Vitals/ Pain       Pain Assessment Pain Assessment: No/denies pain  Home Living                                          Prior Functioning/Environment              Frequency  Min 2X/week        Progress Toward Goals  OT Goals(current goals can now be found in the care plan section)  Progress towards OT goals: Progressing toward goals  Acute Rehab OT Goals Patient Stated Goal: to eat breakfast OT Goal Formulation: With patient Time For Goal Achievement: 05/12/22 Potential to Achieve Goals: Good ADL Goals Pt Will Perform Upper Body Dressing: with supervision;sitting;standing Pt Will Perform Lower Body Dressing: with min assist;sitting/lateral leans;sit to/from stand Pt Will Transfer to Toilet: with min assist;ambulating;regular height toilet Pt Will Perform Tub/Shower Transfer: Tub transfer;Shower transfer;3 in 1;rolling walker;with min assist  Plan Discharge plan remains appropriate;Frequency remains appropriate    Co-evaluation                 AM-PAC OT "6 Clicks"  Daily Activity     Outcome Measure   Help from another person eating meals?: None Help from another person taking care of personal grooming?: A Little Help from another person toileting, which includes using toliet, bedpan, or urinal?: A Little Help from another person bathing (including washing, rinsing, drying)?: A Lot Help from another person to put on and taking off regular upper body clothing?: A Little Help from another person to put on and taking off regular lower body clothing?: A Lot 6 Click Score: 17    End of Session Equipment Utilized During Treatment: Gait belt;Rolling walker (2 wheels)  OT Visit Diagnosis: Unsteadiness on feet (R26.81);Other abnormalities of gait and mobility (R26.89);Muscle weakness (generalized) (M62.81);Other symptoms and signs involving cognitive function   Activity Tolerance Patient tolerated treatment well   Patient Left in chair;with call bell/phone within reach;with chair alarm set   Nurse Communication Mobility status        Time: 1027-2536 OT Time Calculation (min): 18 min  Charges: OT General Charges $OT Visit: 1 Visit OT Treatments $Self Care/Home Management : 8-22 mins  Alfonzo Beers, OTD, OTR/L Acute Rehab [phone]   Mayer Masker 05/01/2022, 9:28 AM

## 2022-05-01 NOTE — Progress Notes (Signed)
Progress Note  Patient Name: [name] Date of Encounter: 05/01/2022  [facility] Cardiologist: Olga Millers, MD   Subjective   Feels "OK", no CP, no SOB  Inpatient Medications    Scheduled Meds:  aspirin EC  81 mg Oral Daily   doxycycline  100 mg Oral Q12H   ezetimibe  10 mg Oral Daily   feeding supplement  237 mL Oral BID BM   hydrocerin   Topical BID   hydrochlorothiazide  12.5 mg Oral Daily   mometasone-formoterol  2 puff Inhalation BID   multivitamin with minerals  1 tablet Oral Daily   nicotine  21 mg Transdermal Daily   rosuvastatin  40 mg Oral Daily   sodium chloride flush  3 mL Intravenous Q12H   thiamine  100 mg Oral Daily   Continuous Infusions:  PRN Meds: albuterol, bisacodyl, nicotine polacrilex, OLANZapine **OR** OLANZapine, ondansetron **OR** ondansetron (ZOFRAN) IV   Vital Signs    Vitals:   04/30/22 2057 04/30/22 2350 05/01/22 0520 05/01/22 0853  BP:  (!) 142/74 (!) 162/86 134/71  Pulse:  64 75 64  Resp:  17 20   Temp:  98.5 F (36.9 C) 97.6 F (36.4 C) (!) 97.5 F (36.4 C)  TempSrc:   Oral Oral  SpO2: (!) 88% 90% 91% (!) 88%  Weight:      Height:        Intake/Output Summary (Last 24 hours) at 05/01/2022 1019 Last data filed at 05/01/2022 0545 Gross per 24 hour  Intake 240 ml  Output 1700 ml  Net -1460 ml      04/28/2022   12:30 AM 04/27/2022   11:36 AM 11/06/2021    2:51 PM  Last 3 Weights  Weight (lbs) 122 lb 8 oz 126 lb 15.8 oz 141 lb 12.8 oz  Weight (kg) 55.566 kg 57.6 kg 64.32 kg      Telemetry    SR, occ PVCs - Personally Reviewed  ECG    No new EKGs - Personally Reviewed  Physical Exam   GEN: No acute distress, appears chronically ill.   Neck: No JVD Cardiac: RRR, no murmurs, rubs, or gallops.  Respiratory: slight exp wheeze b/l. GI: Soft, nontender, non-distended  MS: No edema; No deformity. Neuro:  Nonfocal  Psych: Normal affect   Labs    High Sensitivity Troponin:  No results for input(s):  "TROPONINIHS" in the last 720 hours.   Chemistry Recent Labs  Lab 04/29/22 0227 04/30/22 0052 05/01/22 0145  NA 138 138 136  K 2.8* 3.1* 3.9  CL 98 97* 97*  CO2 31 32 34*  GLUCOSE 150* 141* 122*  BUN 23 17 14   CREATININE 1.13 0.91 0.92  CALCIUM 8.4* 8.6* 8.5*  MG 2.0 1.9 1.9  PROT 4.5* 5.1* 5.2*  ALBUMIN 2.0* 2.1* 2.2*  AST 41 43* 52*  ALT 18 23 33  ALKPHOS 70 69 72  BILITOT 0.4 0.4 0.3  GFRNONAA >60 >60 >60  ANIONGAP 9 9 5     Lipids No results for input(s): "CHOL", "TRIG", "HDL", "LABVLDL", "LDLCALC", "CHOLHDL" in the last 168 hours.  Hematology Recent Labs  Lab 04/29/22 0227 04/30/22 0052 05/01/22 0145  WBC 7.8 7.9 8.9  RBC 3.64* 3.76* 3.90*  HGB 11.3* 11.8* 12.2*  HCT 34.3* 35.6* 36.8*  MCV 94.2 94.7 94.4  MCH 31.0 31.4 31.3  MCHC 32.9 33.1 33.2  RDW 16.3* 16.1* 15.9*  PLT 220 222 220   Thyroid  Recent Labs  Lab 04/28/22 0307  TSH 0.878    BNPNo results for input(s): "BNP", "PROBNP" in the last 168 hours.  DDimer No results for input(s): "DDIMER" in the last 168 hours.   Radiology    No results found.  Cardiac Studies   04/28/22; TTE 1. Left ventricular ejection fraction, by estimation, is 60 to 65%. The  left ventricle has normal function. The left ventricle has no regional  wall motion abnormalities. There is mild left ventricular hypertrophy.  Left ventricular diastolic parameters  are consistent with Grade II diastolic dysfunction (pseudonormalization).   2. Right ventricular systolic function is normal. The right ventricular  size is normal.   3. The mitral valve is normal in structure. Mild mitral valve  regurgitation. No evidence of mitral stenosis. Moderate mitral annular  calcification.   4. The aortic valve is tricuspid. Aortic valve regurgitation is not  visualized. Aortic valve sclerosis is present, with no evidence of aortic  valve stenosis.   5. The inferior vena cava is normal in size with greater than 50%  respiratory  variability, suggesting right atrial pressure of 3 mmHg.   Patient Profile     81 y.o. male with a hx of PVD (known occl R ICA), CAD,  CAD (non-obstructive by cath 2007 with hx of prior PCI to RCA), HTN, HLD, COPD admitted after a fall (unclear mechanism vs faint) and AMS  Cardiology > EP called for episode of CHB with long pause  Assessment & Plan     Falls/syncope Very unclear, pt insists he is falling/tripping, denies syncope but has fallen more then once in a few months it seems Family by record found him on the garage floor, awake and confused   CHB No nodal blocking agents Significant hypokalemia, not felt to explain his CHB   Planned for PPM implant tomorrow, pt is aware and agreeable Discussed procedure including potential risks/benefits with the pt, Dr. Lalla Brothers has seen him this Am as well He remains agreeable I spoke with his daughter [name] as well.  Orders in Lovenox d/c SCDs    PVCs, NSVT Most likely 2/2 low potassium K+ is better so is V ectopy burden No NSVT last 24 hours    4. AMS AAO x4 currently again today He denies syncope but can not tell us exactly how he fell Sounds like he has had in general increased depression, and functional decline at home since his wife transitioned to a SNF ? +/- COPD exacerbation with poor compliance at home reported with his O2 and nebs   For questions or updates, please contact [facility] Please consult [email or web address] for contact info under        Signed, Sheilah Pigeon, PA-C  05/01/2022, 10:19 AM

## 2022-05-01 NOTE — Care Management Important Message (Signed)
Important Message  Patient Details  Name: [name] MRN: [id number] Date of Birth: Apr 11, 1941   Medicare Important Message Given:  Yes     Renie Ora 05/01/2022, 1:46 PM

## 2022-05-01 NOTE — Progress Notes (Signed)
Progress Note  Patient: [name] CWC:[id number] DOB: 1941-08-21  DOA: 04/27/2022  DOS: 05/01/2022    Brief hospital course: [name] is a 81 y.o. male with medical history significant of COPD Gold stage III, chronic hypoxic failure, noncompliant with home O2 and COPD medications, HTN, HLD, CAD status post stenting, cigarette smoke, presented with fall and altered mentations.   He was seen by neurology due to suspected stroke.  Symptoms thought possibly related to fall related to COPD exacerbation (confusion related to hypoxia or hypercarbia -> none documented on blood gas).  He had 7 second pause and EP is planning for pacemaker placement on 7/6.  Assessment and Plan: Complete heart block: 7 second pause on telemetry noted 7/3. Asymptomatic. TSH 0.878. Echo with LVEF 60-65%, no WMA, G2DD, LVH, normal RV, mild MR. - Cardiology consulted > EP > planning pacemaker 7/6.   - Avoid negative chronotropes   Acute metabolic encephalopathy: Due to COPD exacerbation, improved, still some hospital delirium. B12, TSH wnl.  VBG without hypercarbia - Delirium precautions - zyprexa daily prn per psychiatry   AECOPD: Wheezing improved.  - Complete 5 days of prednisone, doxycycline - Continue scheduled and prn nebs.   Stroke-like symptoms, chronic right ICA occlusion: Code stroke was called on admission, thought to have mild R sided motor deficits, mild ataxia/cognitive/communication deficits. MRI brain without acute abnormality. CTA head/neck with right ICA occluded at bifurcation with reconstitution at level of R posterior communicating artery, less than 50% stenosis of L ICA at bifurcation, moderate stenosis of up to 70% in the R vertebral artery as it enters spinal canal at C6, moderate stenosis of proximal L posterior cerebral artery. Echo with EF 60-65%, grade II diastolic dysfunction  - ASA recommended for CVA ppx per neurology.     Fall at home, initial encounter: Found down on garage floor,  unclear how long he'd been there. Suspected related to COPD exacerbation/deconditioning vs complete heart block. - Fall precautions, home health arranged and increased level of supervision at home recommended at discharge.     Hypokalemia: Improved with supplementation.  - Monitor with magnesium levels as well. Important to keep replete.  Hypophosphatemia:  - Supplement and monitor  Hematuria:  - Follow up with urology after discharge for work up including CT and cystoscopy if this persists.     AKI: Improved with IVF, at suspected baseline.    Severe protein calorie malnutrition:  - Supplement protein as able.   Essential hypertension - Continue HCTZ   Depression - Psychiatry consult appreciated: Oral thiamine, olanzapine daily prn agitation    Tobacco use:  - Cessation counseling provided.  - Nicotine patch while admitted   Hypertrophic toenail - Needs podiatry follow up  Stage 2 sacral pressure injury POA:  - Offload, optimize nutritional status.   AOCD: Normocytic, stable, monitor at follow up.   AST elevation: - Monitor at follow up  Subjective: No dyspnea or chest pain or palpitations or lightheadedness when standing. Asked me how I got into his house.   Objective: Vitals:   05/01/22 0520 05/01/22 0853 05/01/22 1117 05/01/22 1534  BP: (!) 162/86 134/71 (!) 130/99 123/68  Pulse: 75 64 74 71  Resp: 20  18 18   Temp: 97.6 F (36.4 C) (!) 97.5 F (36.4 C) 98.1 F (36.7 C) 98 F (36.7 C)  TempSrc: Oral Oral Oral   SpO2: 91% (!) 88%  94%  Weight:      Height:       Gen: Elderly  male in no distress sitting in recliner. Pulm: Nonlabored breathing, diminished without wheezing or crackles.  CV: Regular borderline bradycardia with soft systolic murmur, rub, or gallop. No JVD, no dependent edema. GI: Abdomen soft, non-tender, non-distended, with normoactive bowel sounds.  Ext: Warm, no deformities Skin: No acute rashes, lesions or ulcers on visualized  skin. Neuro: Alert and incompletely oriented but redirectable without focal neurological deficits. Psych: Judgement and insight appear marginal. Mood euthymic & affect congruent. Behavior is appropriate.    Data Personally reviewed: CBC: Recent Labs  Lab 04/27/22 1143 04/28/22 0307 04/29/22 0227 04/30/22 0052 05/01/22 0145  WBC 5.3 4.9 7.8 7.9 8.9  NEUTROABS 4.3  --  5.7 6.6 7.1  HGB 13.9 11.9* 11.3* 11.8* 12.2*  HCT 41.9 35.6* 34.3* 35.6* 36.8*  MCV 94.2 93.0 94.2 94.7 94.4  PLT 242 172 220 222 [id number]   Basic Metabolic Panel: Recent Labs  Lab 04/27/22 1143 04/28/22 0027 04/29/22 0227 04/30/22 0052 05/01/22 0145  NA 137 135 138 138 136  K 5.5* 3.8 2.8* 3.1* 3.9  CL 96* 106 98 97* 97*  CO2 27 19* 31 32 34*  GLUCOSE 96 129* 150* 141* 122*  BUN 15 5* 23 17 14   CREATININE 1.17 0.65 1.13 0.91 0.92  CALCIUM 8.4* 9.1 8.4* 8.6* 8.5*  MG  --   --  2.0 1.9 1.9  PHOS  --   --  1.8* 2.2* 1.6*   GFR: Estimated Creatinine Clearance: 48.7 mL/min (by C-G formula based on SCr of 0.92 mg/dL). Liver Function Tests: Recent Labs  Lab 04/27/22 1143 04/28/22 0027 04/29/22 0227 04/30/22 0052 05/01/22 0145  AST 41  --  41 43* 52*  ALT 27  --  18 23 33  ALKPHOS 84  --  70 69 72  BILITOT 1.9*  --  0.4 0.4 0.3  PROT 5.5*  --  4.5* 5.1* 5.2*  ALBUMIN 2.2* 2.8* 2.0* 2.1* 2.2*   No results for input(s): "LIPASE", "AMYLASE" in the last 168 hours. Recent Labs  Lab 04/27/22 2025  AMMONIA 16   Coagulation Profile: Recent Labs  Lab 04/27/22 1143  INR 1.3*   Cardiac Enzymes: Recent Labs  Lab 04/27/22 1143 04/28/22 0027  CKTOTAL 838* 326   BNP (last 3 results) Recent Labs    10/04/21 0916  PROBNP 27.0   HbA1C: No results for input(s): "HGBA1C" in the last 72 hours. CBG: Recent Labs  Lab 04/27/22 1132  GLUCAP 91   Lipid Profile: No results for input(s): "CHOL", "HDL", "LDLCALC", "TRIG", "CHOLHDL", "LDLDIRECT" in the last 72 hours. Thyroid Function Tests: No results  for input(s): "TSH", "T4TOTAL", "FREET4", "T3FREE", "THYROIDAB" in the last 72 hours. Anemia Panel: No results for input(s): "VITAMINB12", "FOLATE", "FERRITIN", "TIBC", "IRON", "RETICCTPCT" in the last 72 hours. Urine analysis:    Component Value Date/Time   COLORURINE AMBER (A) 04/27/2022 1630   APPEARANCEUR HAZY (A) 04/27/2022 1630   LABSPEC 1.045 (H) 04/27/2022 1630   PHURINE 5.0 04/27/2022 1630   GLUCOSEU NEGATIVE 04/27/2022 1630   GLUCOSEU NEGATIVE 08/16/2021 1439   HGBUR LARGE (A) 04/27/2022 1630   BILIRUBINUR NEGATIVE 04/27/2022 1630   KETONESUR 20 (A) 04/27/2022 1630   PROTEINUR 100 (A) 04/27/2022 1630   UROBILINOGEN 1.0 08/16/2021 1439   NITRITE NEGATIVE 04/27/2022 1630   LEUKOCYTESUR NEGATIVE 04/27/2022 1630   Recent Results (from the past 240 hour(s))  Resp Panel by RT-PCR (Flu A&B, Covid) Anterior Nasal Swab     Status: None   Collection Time: 04/27/22  8:25 PM  Specimen: Anterior Nasal Swab  Result Value Ref Range Status   SARS Coronavirus 2 by RT PCR NEGATIVE NEGATIVE Final    Comment: (NOTE) SARS-CoV-2 target nucleic acids are NOT DETECTED.  The SARS-CoV-2 RNA is generally detectable in upper respiratory specimens during the acute phase of infection. The lowest concentration of SARS-CoV-2 viral copies this assay can detect is 138 copies/mL. A negative result does not preclude SARS-Cov-2 infection and should not be used as the sole basis for treatment or other patient management decisions. A negative result may occur with  improper specimen collection/handling, submission of specimen other than nasopharyngeal swab, presence of viral mutation(s) within the areas targeted by this assay, and inadequate number of viral copies(<138 copies/mL). A negative result must be combined with clinical observations, patient history, and epidemiological information. The expected result is Negative.  Fact Sheet for Patients:  BloggerCourse.com  Fact  Sheet for Healthcare Providers:  SeriousBroker.it  This test is no t yet approved or cleared by the [place] FDA and  has been authorized for detection and/or diagnosis of SARS-CoV-2 by FDA under an Emergency Use Authorization (EUA). This EUA will remain  in effect (meaning this test can be used) for the duration of the COVID-19 declaration under Section 564(b)(1) of the Act, 21 U.S.C.section 360bbb-3(b)(1), unless the authorization is terminated  or revoked sooner.       Influenza A by PCR NEGATIVE NEGATIVE Final   Influenza B by PCR NEGATIVE NEGATIVE Final    Comment: (NOTE) The Xpert Xpress SARS-CoV-2/FLU/RSV plus assay is intended as an aid in the diagnosis of influenza from Nasopharyngeal swab specimens and should not be used as a sole basis for treatment. Nasal washings and aspirates are unacceptable for Xpert Xpress SARS-CoV-2/FLU/RSV testing.  Fact Sheet for Patients: BloggerCourse.com  Fact Sheet for Healthcare Providers: SeriousBroker.it  This test is not yet approved or cleared by the [place] FDA and has been authorized for detection and/or diagnosis of SARS-CoV-2 by FDA under an Emergency Use Authorization (EUA). This EUA will remain in effect (meaning this test can be used) for the duration of the COVID-19 declaration under Section 564(b)(1) of the Act, 21 U.S.C. section 360bbb-3(b)(1), unless the authorization is terminated or revoked.  Performed at [facility], [address], [place], [place] [address]   Urine Culture     Status: Abnormal   Collection Time: 04/28/22  7:43 AM   Specimen: Urine, Clean Catch  Result Value Ref Range Status   Specimen Description URINE, CLEAN CATCH  Final   Special Requests   Final    NONE Performed at [facility], [address], [place], [place] [address]    Culture MULTIPLE SPECIES PRESENT, SUGGEST RECOLLECTION (A)  Final    Report Status 04/30/2022 FINAL  Final     Family Communication: None at bedside, EP discussed with [name]  Disposition: Status is: Inpatient Remains inpatient appropriate because: Needs pacemaker, likely DC home 7/7 Planned Discharge Destination: Home      Tyrone Nine, MD 05/01/2022 5:28 PM Page by [email or web address]

## 2022-05-02 ENCOUNTER — Encounter (HOSPITAL_COMMUNITY)
Admission: EM | Disposition: A | Discharge status: Home Health | Payer: Self-pay | Source: Home / Self Care | Attending: Family Medicine

## 2022-05-02 DIAGNOSIS — I442 Atrioventricular block, complete: Secondary | ICD-10-CM | POA: Diagnosis not present

## 2022-05-02 HISTORY — PX: PACEMAKER IMPLANT: EP1218

## 2022-05-02 LAB — COMPREHENSIVE METABOLIC PANEL
ALT: 50 U/L — ABNORMAL HIGH (ref 0–44)
AST: 71 U/L — ABNORMAL HIGH (ref 15–41)
Albumin: 2.3 g/dL — ABNORMAL LOW (ref 3.5–5.0)
Alkaline Phosphatase: 78 U/L (ref 38–126)
Anion gap: 10 (ref 5–15)
BUN: 12 mg/dL (ref 8–23)
CO2: 32 mmol/L (ref 22–32)
Calcium: 9.2 mg/dL (ref 8.9–10.3)
Chloride: 92 mmol/L — ABNORMAL LOW (ref 98–111)
Creatinine, Ser: 0.82 mg/dL (ref 0.61–1.24)
GFR, Estimated: >60 mL/min (ref >60)
Glucose, Bld: 126 mg/dL — ABNORMAL HIGH (ref 70–99)
Potassium: 4.5 mmol/L (ref 3.5–5.1)
Sodium: 134 mmol/L — ABNORMAL LOW (ref 135–145)
Total Bilirubin: 0.4 mg/dL (ref 0.3–1.2)
Total Protein: 5.3 g/dL — ABNORMAL LOW (ref 6.5–8.1)

## 2022-05-02 LAB — PHOSPHORUS: Phosphorus: 3.1 mg/dL (ref 2.5–4.6)

## 2022-05-02 LAB — SURGICAL PCR SCREEN
MRSA, PCR: NEGATIVE
Staphylococcus aureus: NEGATIVE

## 2022-05-02 LAB — MAGNESIUM: Magnesium: 1.8 mg/dL (ref 1.7–2.4)

## 2022-05-02 SURGERY — PACEMAKER IMPLANT

## 2022-05-02 MED ORDER — SODIUM CHLORIDE 0.9 % IV SOLN
80.0000 mg | INTRAVENOUS | Status: AC
  Administered 2022-05-02: 80 mg
  Filled 2022-05-02: qty 2

## 2022-05-02 MED ORDER — HEPARIN (PORCINE) IN NACL 1000-0.9 UT/500ML-% IV SOLN
INTRAVENOUS | Status: DC | PRN
  Administered 2022-05-02: 500 mL

## 2022-05-02 MED ORDER — LIDOCAINE HCL (PF) 1 % IJ SOLN
INTRAMUSCULAR | Status: DC | PRN
  Administered 2022-05-02: 60 mL

## 2022-05-02 MED ORDER — SODIUM CHLORIDE 0.9 % IV SOLN
INTRAVENOUS | Status: AC
  Filled 2022-05-02: qty 2

## 2022-05-02 MED ORDER — SODIUM CHLORIDE 0.9 % IV SOLN: 250.0000 mL | INTRAVENOUS | Status: DC

## 2022-05-02 MED ORDER — HYDRALAZINE HCL 20 MG/ML IJ SOLN: 5.0000 mg | Freq: Four times a day (QID) | INTRAMUSCULAR | Status: DC | PRN

## 2022-05-02 MED ORDER — CHLORHEXIDINE GLUCONATE 4 % EX LIQD
60.0000 mL | Freq: Once | CUTANEOUS | Status: AC
  Administered 2022-05-02: 4 via TOPICAL
  Filled 2022-05-02: qty 60

## 2022-05-02 MED ORDER — HEPARIN (PORCINE) IN NACL 1000-0.9 UT/500ML-% IV SOLN
INTRAVENOUS | Status: AC
  Filled 2022-05-02: qty 500

## 2022-05-02 MED ORDER — VANCOMYCIN HCL IN DEXTROSE 1-5 GM/200ML-% IV SOLN
INTRAVENOUS | Status: AC
  Filled 2022-05-02: qty 200

## 2022-05-02 MED ORDER — LIDOCAINE HCL 1 % IJ SOLN
INTRAMUSCULAR | Status: AC
  Filled 2022-05-02: qty 60

## 2022-05-02 MED ORDER — SODIUM CHLORIDE 0.9% FLUSH: 3.0000 mL | INTRAVENOUS | Status: DC | PRN

## 2022-05-02 MED ORDER — VANCOMYCIN HCL IN DEXTROSE 1-5 GM/200ML-% IV SOLN
1000.0000 mg | INTRAVENOUS | Status: AC
  Administered 2022-05-02: 1000 mg via INTRAVENOUS
  Filled 2022-05-02: qty 200

## 2022-05-02 MED ORDER — SODIUM CHLORIDE 0.9 % IV SOLN: INTRAVENOUS | Status: DC

## 2022-05-02 SURGICAL SUPPLY — 16 items
CABLE SURGICAL S-101-97-12 (CABLE) ×2 IMPLANT
CATH CPS LOCATOR 3D MED (CATHETERS) ×1 IMPLANT
HELIX LOCKING TOOL (MISCELLANEOUS) ×2
KIT MICROPUNCTURE NIT STIFF (SHEATH) ×1 IMPLANT
LEAD TENDRIL MRI 52CM LPA1200M (Lead) ×1 IMPLANT
LEAD TENDRIL SDX 2088TC-58CM (Lead) ×1 IMPLANT
PACEMAKER ASSURITY DR-RF (Pacemaker) ×1 IMPLANT
PAD DEFIB RADIO PHYSIO CONN (PAD) ×2 IMPLANT
SHEATH 8FR PRELUDE SNAP 13 (SHEATH) ×1 IMPLANT
SHEATH 9FR PRELUDE SNAP 13 (SHEATH) ×1 IMPLANT
SHEATH PROBE COVER 6X72 (BAG) ×2 IMPLANT
SLITTER AGILIS HISPRO (INSTRUMENTS) ×1 IMPLANT
TOOL HELIX LOCKING (MISCELLANEOUS) IMPLANT
TRAY PACEMAKER INSERTION (PACKS) ×2 IMPLANT
WIRE HI TORQ VERSACORE-J 145CM (WIRE) ×1 IMPLANT
WIRE MICROINTRODUCER 60CM (WIRE) ×1 IMPLANT

## 2022-05-02 NOTE — Progress Notes (Signed)
Progress Note  Patient Name: [name] Date of Encounter: 05/02/2022  [facility] Cardiologist: Olga Millers, MD   Subjective   Feels "fine", no CP, no SOB  Inpatient Medications    Scheduled Meds:  aspirin EC  81 mg Oral Daily   doxycycline  100 mg Oral Q12H   ezetimibe  10 mg Oral Daily   feeding supplement  237 mL Oral BID BM   gentamicin (GARAMYCIN) 80 mg in sodium chloride 0.9 % 500 mL irrigation  80 mg Irrigation On Call   hydrocerin   Topical BID   hydrochlorothiazide  12.5 mg Oral Daily   mometasone-formoterol  2 puff Inhalation BID   multivitamin with minerals  1 tablet Oral Daily   nicotine  21 mg Transdermal Daily   rosuvastatin  40 mg Oral Daily   sodium chloride flush  3 mL Intravenous Q12H   thiamine  100 mg Oral Daily   Continuous Infusions:  sodium chloride     sodium chloride     vancomycin     PRN Meds: albuterol, bisacodyl, hydrALAZINE, nicotine polacrilex, OLANZapine **OR** OLANZapine, ondansetron **OR** ondansetron (ZOFRAN) IV, sodium chloride flush   Vital Signs    Vitals:   05/02/22 0200 05/02/22 0400 05/02/22 0815 05/02/22 0845  BP: (!) 142/72 (!) 152/70  (!) 143/67  Pulse:  60  62  Resp:  16  12  Temp:  97.9 F (36.6 C)  97.9 F (36.6 C)  TempSrc:  Axillary  Oral  SpO2:  96% 94% 92%  Weight:      Height:        Intake/Output Summary (Last 24 hours) at 05/02/2022 0948 Last data filed at 05/02/2022 0842 Gross per 24 hour  Intake 243 ml  Output 1550 ml  Net -1307 ml      04/28/2022   12:30 AM 04/27/2022   11:36 AM 11/06/2021    2:51 PM  Last 3 Weights  Weight (lbs) 122 lb 8 oz 126 lb 15.8 oz 141 lb 12.8 oz  Weight (kg) 55.566 kg 57.6 kg 64.32 kg      Telemetry    SR, 60's occ PVCs - Personally Reviewed  ECG    No new EKGs - Personally Reviewed  Physical Exam   Exam remains unchanged GEN: No acute distress, appears chronically ill.   Neck: No JVD Cardiac: RRR, no murmurs, rubs, or gallops.  Respiratory: slight  exp wheeze b/l. GI: Soft, nontender, non-distended  MS: No edema; No deformity. Neuro:  Nonfocal  Psych: Normal affect   Labs    High Sensitivity Troponin:  No results for input(s): "TROPONINIHS" in the last 720 hours.   Chemistry Recent Labs  Lab 04/30/22 0052 05/01/22 0145 05/02/22 0107  NA 138 136 134*  K 3.1* 3.9 4.5  CL 97* 97* 92*  CO2 32 34* 32  GLUCOSE 141* 122* 126*  BUN 17 14 12   CREATININE 0.91 0.92 0.82  CALCIUM 8.6* 8.5* 9.2  MG 1.9 1.9 1.8  PROT 5.1* 5.2* 5.3*  ALBUMIN 2.1* 2.2* 2.3*  AST 43* 52* 71*  ALT 23 33 50*  ALKPHOS 69 72 78  BILITOT 0.4 0.3 0.4  GFRNONAA >60 >60 >60  ANIONGAP 9 5 10     Lipids No results for input(s): "CHOL", "TRIG", "HDL", "LABVLDL", "LDLCALC", "CHOLHDL" in the last 168 hours.  Hematology Recent Labs  Lab 04/29/22 0227 04/30/22 0052 05/01/22 0145  WBC 7.8 7.9 8.9  RBC 3.64* 3.76* 3.90*  HGB 11.3* 11.8* 12.2*  HCT 34.3* 35.6* 36.8*  MCV 94.2 94.7 94.4  MCH 31.0 31.4 31.3  MCHC 32.9 33.1 33.2  RDW 16.3* 16.1* 15.9*  PLT 220 222 220   Thyroid  Recent Labs  Lab 04/28/22 0307  TSH 0.878    BNPNo results for input(s): "BNP", "PROBNP" in the last 168 hours.  DDimer No results for input(s): "DDIMER" in the last 168 hours.   Radiology    No results found.  Cardiac Studies   04/28/22; TTE 1. Left ventricular ejection fraction, by estimation, is 60 to 65%. The  left ventricle has normal function. The left ventricle has no regional  wall motion abnormalities. There is mild left ventricular hypertrophy.  Left ventricular diastolic parameters  are consistent with Grade II diastolic dysfunction (pseudonormalization).   2. Right ventricular systolic function is normal. The right ventricular  size is normal.   3. The mitral valve is normal in structure. Mild mitral valve  regurgitation. No evidence of mitral stenosis. Moderate mitral annular  calcification.   4. The aortic valve is tricuspid. Aortic valve regurgitation  is not  visualized. Aortic valve sclerosis is present, with no evidence of aortic  valve stenosis.   5. The inferior vena cava is normal in size with greater than 50%  respiratory variability, suggesting right atrial pressure of 3 mmHg.   Patient Profile     81 y.o. male with a hx of PVD (known occl R ICA), CAD,  CAD (non-obstructive by cath 2007 with hx of prior PCI to RCA), HTN, HLD, COPD admitted after a fall (unclear mechanism vs faint) and AMS  Cardiology > EP called for episode of CHB with long pause  Assessment & Plan     Falls/syncope Very unclear, pt insists he is falling/tripping, denies syncope but has fallen more then once in a few months it seems Family by record found him on the garage floor, awake and confused   CHB No nodal blocking agents Significant hypokalemia, not felt to explain his CHB   Planned for PPM implant today Discussed procedure again today including potential risks/benefits with the pt, and son at bedside this AM He remains agreeable  Orders in Lovenox d/c SCDs    PVCs, NSVT Most likely 2/2 low potassium K+ is better so is V ectopy burden No NSVT last 24 hours    4. AMS AAO x4 currently again today He denies syncope but can not tell us exactly how he fell Sounds like he has had in general increased depression, and functional decline at home since his wife transitioned to a SNF ? +/- COPD exacerbation with poor compliance at home reported with his O2 and nebs   For questions or updates, please contact [facility] Please consult [email or web address] for contact info under        Signed, Sheilah Pigeon, PA-C  05/02/2022, 9:48 AM

## 2022-05-02 NOTE — Progress Notes (Signed)
Progress Note  Patient: LYCAN CORBIN [id number] DOB: June 18, 1941  DOA: 04/27/2022  DOS: 05/02/2022    Brief hospital course: HARUT NIGHSWONGER is a 81 y.o. male with medical history significant of COPD Gold stage III, chronic hypoxic failure, noncompliant with home O2 and COPD medications, HTN, HLD, CAD status post stenting, cigarette smoke, presented with fall and altered mentations.   He was seen by neurology due to suspected stroke.  Symptoms thought possibly related to fall related to COPD exacerbation (confusion related to hypoxia or hypercarbia -> none documented on blood gas).  He had 7 second pause and EP is planning for pacemaker placement on 7/6.  Assessment and Plan: Complete heart block: 7 second pause on telemetry noted 7/3. Asymptomatic. TSH 0.878. Echo with LVEF 60-65%, no WMA, G2DD, LVH, normal RV, mild MR. - Cardiology consulted > EP > planning pacemaker 7/6, postprocedure care per EP. Appreciate their assistance.   - Avoid negative chronotropes   Acute metabolic encephalopathy: Due to COPD exacerbation, improved, still some hospital delirium. B12, TSH wnl.  VBG without hypercarbia - Delirium precautions - Zyprexa daily prn per psychiatry   AECOPD: Wheezing improved.  - Will complete 5 days of prednisone, doxycycline - Continue scheduled and prn nebs.   Stroke-like symptoms, chronic right ICA occlusion: Code stroke was called on admission, thought to have mild R sided motor deficits, mild ataxia/cognitive/communication deficits. MRI brain without acute abnormality. CTA head/neck with right ICA occluded at bifurcation with reconstitution at level of R posterior communicating artery, less than 50% stenosis of L ICA at bifurcation, moderate stenosis of up to 70% in the R vertebral artery as it enters spinal canal at C6, moderate stenosis of proximal L posterior cerebral artery. Echo with EF 60-65%, grade II diastolic dysfunction  - ASA recommended for CVA ppx per neurology.      Fall at home, initial encounter: Found down on garage floor, unclear how long he'd been there. Suspected related to COPD exacerbation/deconditioning vs complete heart block. - Fall precautions, home health arranged and increased level of supervision at home recommended at discharge.     Hypokalemia: Improved with supplementation.  - Monitor with magnesium levels as well. Important to keep replete.  Hypophosphatemia:  - Supplement and monitor  Hematuria:  - Follow up with urology after discharge for work up including CT and cystoscopy if this persists.     AKI: Improved with IVF, at suspected baseline.    Severe protein calorie malnutrition, hypoalbuminemia:  - Supplement protein as able.   Essential hypertension - Continue HCTZ   Depression - Psychiatry consult appreciated: Oral thiamine, olanzapine daily prn agitation    Tobacco use:  - Cessation counseling provided.  - Nicotine patch while admitted   Hypertrophic toenail - Needs podiatry follow up  Stage 2 sacral pressure injury POA:  - Offload, optimize nutritional status.   AOCD: Normocytic, stable, monitor at follow up.   AST elevation: - Monitor at follow up  Subjective: No new complaints, ready for pacemaker. Specifically denies shortness of breath, chest pain, or palpitations.   Objective: Vitals:   05/02/22 0400 05/02/22 0815 05/02/22 0845 05/02/22 1230  BP: (!) 152/70  (!) 143/67 107/67  Pulse: 60  62 77  Resp: 16  12 16   Temp: 97.9 F (36.6 C)  97.9 F (36.6 C) (!) 97.5 F (36.4 C)  TempSrc: Axillary  Oral Oral  SpO2: 96% 94% 92% 90%  Weight:      Height:       Gen:  Elderly mail in no distress Pulm: Diminished without adventitious sounds, nonlabored on room air CV: RRR, rate in 60's no MRG or edema   Data Personally reviewed: CBC: Recent Labs  Lab 04/27/22 1143 04/28/22 0307 04/29/22 0227 04/30/22 0052 05/01/22 0145  WBC 5.3 4.9 7.8 7.9 8.9  NEUTROABS 4.3  --  5.7 6.6 7.1  HGB 13.9  11.9* 11.3* 11.8* 12.2*  HCT 41.9 35.6* 34.3* 35.6* 36.8*  MCV 94.2 93.0 94.2 94.7 94.4  PLT 242 172 220 222 220   Basic Metabolic Panel: Recent Labs  Lab 04/28/22 0027 04/29/22 0227 04/30/22 0052 05/01/22 0145 05/02/22 0107  NA 135 138 138 136 134*  K 3.8 2.8* 3.1* 3.9 4.5  CL 106 98 97* 97* 92*  CO2 19* 31 32 34* 32  GLUCOSE 129* 150* 141* 122* 126*  BUN 5* 23 17 14 12   CREATININE 0.65 1.13 0.91 0.92 0.82  CALCIUM 9.1 8.4* 8.6* 8.5* 9.2  MG  --  2.0 1.9 1.9 1.8  PHOS  --  1.8* 2.2* 1.6* 3.1   GFR: Estimated Creatinine Clearance: 54.6 mL/min (by C-G formula based on SCr of 0.82 mg/dL). Liver Function Tests: Recent Labs  Lab 04/27/22 1143 04/28/22 0027 04/29/22 0227 04/30/22 0052 05/01/22 0145 05/02/22 0107  AST 41  --  41 43* 52* 71*  ALT 27  --  18 23 33 50*  ALKPHOS 84  --  70 69 72 78  BILITOT 1.9*  --  0.4 0.4 0.3 0.4  PROT 5.5*  --  4.5* 5.1* 5.2* 5.3*  ALBUMIN 2.2* 2.8* 2.0* 2.1* 2.2* 2.3*   No results for input(s): "LIPASE", "AMYLASE" in the last 168 hours. Recent Labs  Lab 04/27/22 2025  AMMONIA 16   Coagulation Profile: Recent Labs  Lab 04/27/22 1143  INR 1.3*   Cardiac Enzymes: Recent Labs  Lab 04/27/22 1143 04/28/22 0027  CKTOTAL 838* 326   BNP (last 3 results) Recent Labs    10/04/21 0916  PROBNP 27.0   HbA1C: No results for input(s): "HGBA1C" in the last 72 hours. CBG: Recent Labs  Lab 04/27/22 1132  GLUCAP 91   Lipid Profile: No results for input(s): "CHOL", "HDL", "LDLCALC", "TRIG", "CHOLHDL", "LDLDIRECT" in the last 72 hours. Thyroid Function Tests: No results for input(s): "TSH", "T4TOTAL", "FREET4", "T3FREE", "THYROIDAB" in the last 72 hours. Anemia Panel: No results for input(s): "VITAMINB12", "FOLATE", "FERRITIN", "TIBC", "IRON", "RETICCTPCT" in the last 72 hours. Urine analysis:    Component Value Date/Time   COLORURINE AMBER (A) 04/27/2022 1630   APPEARANCEUR HAZY (A) 04/27/2022 1630   LABSPEC 1.045 (H)  04/27/2022 1630   PHURINE 5.0 04/27/2022 1630   GLUCOSEU NEGATIVE 04/27/2022 1630   GLUCOSEU NEGATIVE 08/16/2021 1439   HGBUR LARGE (A) 04/27/2022 1630   BILIRUBINUR NEGATIVE 04/27/2022 1630   KETONESUR 20 (A) 04/27/2022 1630   PROTEINUR 100 (A) 04/27/2022 1630   UROBILINOGEN 1.0 08/16/2021 1439   NITRITE NEGATIVE 04/27/2022 1630   LEUKOCYTESUR NEGATIVE 04/27/2022 1630   Recent Results (from the past 240 hour(s))  Resp Panel by RT-PCR (Flu A&B, Covid) Anterior Nasal Swab     Status: None   Collection Time: 04/27/22  8:25 PM   Specimen: Anterior Nasal Swab  Result Value Ref Range Status   SARS Coronavirus 2 by RT PCR NEGATIVE NEGATIVE Final    Comment: (NOTE) SARS-CoV-2 target nucleic acids are NOT DETECTED.  The SARS-CoV-2 RNA is generally detectable in upper respiratory specimens during the acute phase of infection. The lowest concentration  of SARS-CoV-2 viral copies this assay can detect is 138 copies/mL. A negative result does not preclude SARS-Cov-2 infection and should not be used as the sole basis for treatment or other patient management decisions. A negative result may occur with  improper specimen collection/handling, submission of specimen other than nasopharyngeal swab, presence of viral mutation(s) within the areas targeted by this assay, and inadequate number of viral copies(<138 copies/mL). A negative result must be combined with clinical observations, patient history, and epidemiological information. The expected result is Negative.  Fact Sheet for Patients:  BloggerCourse.com  Fact Sheet for Healthcare Providers:  SeriousBroker.it  This test is no t yet approved or cleared by the [place] FDA and  has been authorized for detection and/or diagnosis of SARS-CoV-2 by FDA under an Emergency Use Authorization (EUA). This EUA will remain  in effect (meaning this test can be used) for the duration of  the COVID-19 declaration under Section 564(b)(1) of the Act, 21 U.S.C.section 360bbb-3(b)(1), unless the authorization is terminated  or revoked sooner.       Influenza A by PCR NEGATIVE NEGATIVE Final   Influenza B by PCR NEGATIVE NEGATIVE Final    Comment: (NOTE) The Xpert Xpress SARS-CoV-2/FLU/RSV plus assay is intended as an aid in the diagnosis of influenza from Nasopharyngeal swab specimens and should not be used as a sole basis for treatment. Nasal washings and aspirates are unacceptable for Xpert Xpress SARS-CoV-2/FLU/RSV testing.  Fact Sheet for Patients: BloggerCourse.com  Fact Sheet for Healthcare Providers: SeriousBroker.it  This test is not yet approved or cleared by the [place] FDA and has been authorized for detection and/or diagnosis of SARS-CoV-2 by FDA under an Emergency Use Authorization (EUA). This EUA will remain in effect (meaning this test can be used) for the duration of the COVID-19 declaration under Section 564(b)(1) of the Act, 21 U.S.C. section 360bbb-3(b)(1), unless the authorization is terminated or revoked.  Performed at [facility], [address], [place], [place] [address]   Urine Culture     Status: Abnormal   Collection Time: 04/28/22  7:43 AM   Specimen: Urine, Clean Catch  Result Value Ref Range Status   Specimen Description URINE, CLEAN CATCH  Final   Special Requests   Final    NONE Performed at [facility], [address], [place], [place] [address]    Culture MULTIPLE SPECIES PRESENT, SUGGEST RECOLLECTION (A)  Final   Report Status 04/30/2022 FINAL  Final  Surgical PCR screen     Status: None   Collection Time: 05/02/22  6:20 AM   Specimen: Nasal Mucosa; Nasal Swab  Result Value Ref Range Status   MRSA, PCR NEGATIVE NEGATIVE Final   Staphylococcus aureus NEGATIVE NEGATIVE Final    Comment: (NOTE) The Xpert SA Assay (FDA approved for NASAL specimens in  patients 37 years of age and older), is one component of a comprehensive surveillance program. It is not intended to diagnose infection nor to guide or monitor treatment. Performed at [facility], [address], [place], [place] [address]      Family Communication: Son at bedside, EP discussed with [name]  Disposition: Status is: Inpatient Remains inpatient appropriate because: Needs pacemaker, likely DC home 7/7 Planned Discharge Destination: Home      Tyrone Nine, MD 05/02/2022 4:00 PM Page by [email or web address]

## 2022-05-02 NOTE — Progress Notes (Signed)
Physical Therapy Treatment Patient Details Name: [name] MRN: [id number] DOB: 28-Sep-1941 Today's Date: 05/02/2022   History of Present Illness Pt is an 81 y/o male who presents 04/27/22 after being found down on the garage floor, awake and confused. MRI negative for acute changes. PMH significant for asthma, CAD, essential HTN, heart murmur, MI, R inguinal hernia s/p repair 11/01/2016, ventral hernia repair, ORIF shoulder fracture, multiple falls.    PT Comments    Pt supine in bed on arrival.  His daughter was present this session and able to motivate patient to participate in PT session this pm.  He required decreased assistance.  Noted UE arm weakness and poor ability to achieve B shoulder flexion.  Continue to recommend HHPT at this time.      Recommendations for follow up therapy are one component of a multi-disciplinary discharge planning process, led by the attending physician.  Recommendations may be updated based on patient status, additional functional criteria and insurance authorization.  Follow Up Recommendations  Home health PT     Assistance Recommended at Discharge Frequent or constant Supervision/Assistance  Patient can return home with the following A lot of help with walking and/or transfers;A lot of help with bathing/dressing/bathroom;Assistance with cooking/housework;Direct supervision/assist for medications management;Direct supervision/assist for financial management;Assist for transportation;Help with stairs or ramp for entrance   Equipment Recommendations  Rolling walker (2 wheels)    Recommendations for Other Services       Precautions / Restrictions Precautions Precautions: Fall Restrictions Weight Bearing Restrictions: No     Mobility  Bed Mobility Overal bed mobility: Needs Assistance Bed Mobility: Supine to Sit     Supine to sit: HOB elevated, Supervision     General bed mobility comments: Increased time and effort but able to move to edge  bed.    Transfers Overall transfer level: Needs assistance Equipment used: Rolling walker (2 wheels) Transfers: Sit to/from Stand Sit to Stand: Min guard           General transfer comment: Cues for hand placement to push into standing to achieve sit to stand.    Ambulation/Gait Ambulation/Gait assistance: Min assist Gait Distance (Feet): 300 Feet Assistive device: Rolling walker (2 wheels) Gait Pattern/deviations: Decreased stride length, Shuffle, Step-through pattern, Trunk flexed, Drifts right/left Gait velocity: Decreased     General Gait Details: Cues for posture and position in RW to improve safety.  Pt with good righting responses.  Gt slowed but functional.   Stairs             Wheelchair Mobility    Modified Rankin (Stroke Patients Only)       Balance Overall balance assessment: Needs assistance Sitting-balance support: Feet supported, No upper extremity supported Sitting balance-Leahy Scale: Poor       Standing balance-Leahy Scale: Fair Standing balance comment: Posterior lean                            Cognition Arousal/Alertness: Awake/alert Behavior During Therapy: Flat affect Overall Cognitive Status: Impaired/Different from baseline Area of Impairment: Orientation, Attention, Following commands, Memory, Safety/judgement, Awareness, Problem solving                 Orientation Level: Disoriented to, Time Current Attention Level: Sustained Memory: Decreased short-term memory Following Commands: Follows one step commands consistently, Follows one step commands with increased time, Follows multi-step commands inconsistently Safety/Judgement: Decreased awareness of safety, Decreased awareness of deficits Awareness: Intellectual Problem Solving: Slow processing,  Difficulty sequencing, Decreased initiation, Requires verbal cues General Comments: Daughter present at bedside to encourage mobility.        Exercises General  Exercises - Lower Extremity Long Arc Quad: AROM, Both, 10 reps, Seated Hip Flexion/Marching: AROM, Both, 10 reps, Seated Other Exercises Other Exercises: B shoulder flexion AAROM.  Pt with poor ability to move smoothly into R shoulder flexion/noted crepitus.  L arm remains weak.    General Comments        Pertinent Vitals/Pain Pain Assessment Pain Assessment: No/denies pain    Home Living                          Prior Function            PT Goals (current goals can now be found in the care plan section) Acute Rehab PT Goals Patient Stated Goal: Return to his home Potential to Achieve Goals: Good Progress towards PT goals: Progressing toward goals    Frequency    Min 3X/week      PT Plan Current plan remains appropriate    Co-evaluation              AM-PAC PT "6 Clicks" Mobility   Outcome Measure  Help needed turning from your back to your side while in a flat bed without using bedrails?: A Little Help needed moving from lying on your back to sitting on the side of a flat bed without using bedrails?: A Little Help needed moving to and from a bed to a chair (including a wheelchair)?: A Little Help needed standing up from a chair using your arms (e.g., wheelchair or bedside chair)?: A Little Help needed to walk in hospital room?: A Little Help needed climbing 3-5 steps with a railing? : A Little 6 Click Score: 18    End of Session Equipment Utilized During Treatment: Gait belt Activity Tolerance: Patient tolerated treatment well Patient left: in chair;with call bell/phone within reach;with chair alarm set Nurse Communication: Mobility status PT Visit Diagnosis: Unsteadiness on feet (R26.81);Other abnormalities of gait and mobility (R26.89);Difficulty in walking, not elsewhere classified (R26.2)     Time: 6073-7106 PT Time Calculation (min) (ACUTE ONLY): 15 min  Charges:  $Gait Training: 8-22 mins                     Bonney Leitz , PTA Acute  Rehabilitation Services Office [phone]    Florestine Avers 05/02/2022, 12:23 PM

## 2022-05-02 NOTE — Progress Notes (Signed)
PT Cancellation Note  Patient Details Name: [name] MRN: [id number] DOB: 1941-06-19   Cancelled Treatment:    Reason Eval/Treat Not Completed: (P) Patient declined, no reason specified (Pt reports he is tired and might try later.  Provided education and encouragement and he continues to decline.)   Mitchell Iwanicki J Aundria Rud 05/02/2022, 9:39 AM  Bonney Leitz , PTA Acute Rehabilitation Services Office [phone]

## 2022-05-03 ENCOUNTER — Inpatient Hospital Stay (HOSPITAL_COMMUNITY): Payer: Medicare Other

## 2022-05-03 ENCOUNTER — Telehealth: Payer: Self-pay | Admitting: Internal Medicine

## 2022-05-03 ENCOUNTER — Encounter (HOSPITAL_COMMUNITY): Payer: Self-pay | Admitting: Cardiology

## 2022-05-03 DIAGNOSIS — I442 Atrioventricular block, complete: Secondary | ICD-10-CM | POA: Diagnosis not present

## 2022-05-03 LAB — MAGNESIUM: Magnesium: 1.8 mg/dL (ref 1.7–2.4)

## 2022-05-03 LAB — COMPREHENSIVE METABOLIC PANEL
ALT: 40 U/L (ref 0–44)
AST: 39 U/L (ref 15–41)
Albumin: 2.2 g/dL — ABNORMAL LOW (ref 3.5–5.0)
Alkaline Phosphatase: 74 U/L (ref 38–126)
Anion gap: 9 (ref 5–15)
BUN: 14 mg/dL (ref 8–23)
CO2: 29 mmol/L (ref 22–32)
Calcium: 8.9 mg/dL (ref 8.9–10.3)
Chloride: 97 mmol/L — ABNORMAL LOW (ref 98–111)
Creatinine, Ser: 0.69 mg/dL (ref 0.61–1.24)
GFR, Estimated: >60 mL/min (ref >60)
Glucose, Bld: 80 mg/dL (ref 70–99)
Potassium: 3.6 mmol/L (ref 3.5–5.1)
Sodium: 135 mmol/L (ref 135–145)
Total Bilirubin: 0.4 mg/dL (ref 0.3–1.2)
Total Protein: 5.1 g/dL — ABNORMAL LOW (ref 6.5–8.1)

## 2022-05-03 LAB — PHOSPHORUS: Phosphorus: 2.9 mg/dL (ref 2.5–4.6)

## 2022-05-03 MED ORDER — EZETIMIBE 10 MG PO TABS: 10.0000 mg | ORAL_TABLET | Freq: Every day | ORAL | 0 refills | Status: DC

## 2022-05-03 MED ORDER — ASPIRIN 81 MG PO TBEC: 81.0000 mg | DELAYED_RELEASE_TABLET | Freq: Every day | ORAL | 0 refills | Status: DC

## 2022-05-03 MED ORDER — HYDROCHLOROTHIAZIDE 12.5 MG PO CAPS: 12.5000 mg | ORAL_CAPSULE | Freq: Every day | ORAL | 0 refills | Status: DC

## 2022-05-03 MED ORDER — ROSUVASTATIN CALCIUM 40 MG PO TABS: 40.0000 mg | ORAL_TABLET | Freq: Every day | ORAL | 0 refills | Status: DC

## 2022-05-03 NOTE — Discharge Instructions (Addendum)
    Supplemental Discharge Instructions for  Pacemaker/Defibrillator Patients   Activity No heavy lifting or vigorous activity with your left/right arm for 6 to 8 weeks.  Do not raise your left/right arm above your head for one week.  Gradually raise your affected arm as drawn below.             05/07/22                     05/08/22                    05/09/22                   05/10/22 __  NO DRIVING until cleared to by your primary doctor.  WOUND CARE Keep the wound area clean and dry.  Do not get this area wet , no showers until cleared to at your wound check visit . The tape/steri-strips on your wound will fall off; do not pull them off.  No bandage is needed on the site.  DO  NOT apply any creams, oils, or ointments to the wound area. If you notice any drainage or discharge from the wound, any swelling or bruising at the site, or you develop a fever > 101? F after you are discharged home, call the office at once.  Special Instructions You are still able to use cellular telephones; use the ear opposite the side where you have your pacemaker/defibrillator.  Avoid carrying your cellular phone near your device. When traveling through airports, show security personnel your identification card to avoid being screened in the metal detectors.  Ask the security personnel to use the hand wand. Avoid arc welding equipment, MRI testing (magnetic resonance imaging), TENS units (transcutaneous nerve stimulators).  Call the office for questions about other devices. Avoid electrical appliances that are in poor condition or are not properly grounded. Microwave ovens are safe to be near or to operate.

## 2022-05-03 NOTE — Progress Notes (Signed)
Progress Note  Patient Name: KENDYN ZAMAN Date of Encounter: 05/03/2022  [facility] Cardiologist: Olga Millers, MD   Subjective   Feels "fine", no CP, no SOB, denies site pain  Inpatient Medications    Scheduled Meds:  aspirin EC  81 mg Oral Daily   doxycycline  100 mg Oral Q12H   ezetimibe  10 mg Oral Daily   feeding supplement  237 mL Oral BID BM   hydrocerin   Topical BID   hydrochlorothiazide  12.5 mg Oral Daily   mometasone-formoterol  2 puff Inhalation BID   multivitamin with minerals  1 tablet Oral Daily   nicotine  21 mg Transdermal Daily   rosuvastatin  40 mg Oral Daily   sodium chloride flush  3 mL Intravenous Q12H   thiamine  100 mg Oral Daily   Continuous Infusions:   PRN Meds: albuterol, bisacodyl, hydrALAZINE, nicotine polacrilex, OLANZapine **OR** OLANZapine, ondansetron **OR** ondansetron (ZOFRAN) IV   Vital Signs    Vitals:   05/03/22 0400 05/03/22 0717 05/03/22 0720 05/03/22 0836  BP: 136/87 (!) 161/76 (!) 159/85   Pulse: 62 62 (!) 55   Resp: 16 20    Temp: 98.2 F (36.8 C) 98.6 F (37 C)    TempSrc: Oral Oral    SpO2: 93% (!) 87% 94% 92%  Weight:      Height:        Intake/Output Summary (Last 24 hours) at 05/03/2022 0851 Last data filed at 05/02/2022 2144 Gross per 24 hour  Intake 3 ml  Output --  Net 3 ml      04/28/2022   12:30 AM 04/27/2022   11:36 AM 11/06/2021    2:51 PM  Last 3 Weights  Weight (lbs) 122 lb 8 oz 126 lb 15.8 oz 141 lb 12.8 oz  Weight (kg) 55.566 kg 57.6 kg 64.32 kg      Telemetry    SR, 60's occ PVCs - Personally Reviewed  ECG    EKG this AM is pending- Personally Reviewed  Physical Exam   GEN: No acute distress, appears chronically ill.   Neck: No JVD Cardiac: RRR, no murmurs, rubs, or gallops.  Respiratory: slight exp wheeze b/l. GI: Soft, nontender, non-distended  MS: No edema; No deformity. Neuro:  Nonfocal  Psych: Normal affect   pacer site is stable, no bleeding or hematoma  Labs     High Sensitivity Troponin:  No results for input(s): "TROPONINIHS" in the last 720 hours.   Chemistry Recent Labs  Lab 05/01/22 0145 05/02/22 0107 05/03/22 0148  NA 136 134* 135  K 3.9 4.5 3.6  CL 97* 92* 97*  CO2 34* 32 29  GLUCOSE 122* 126* 80  BUN 14 12 14   CREATININE 0.92 0.82 0.69  CALCIUM 8.5* 9.2 8.9  MG 1.9 1.8 1.8  PROT 5.2* 5.3* 5.1*  ALBUMIN 2.2* 2.3* 2.2*  AST 52* 71* 39  ALT 33 50* 40  ALKPHOS 72 78 74  BILITOT 0.3 0.4 0.4  GFRNONAA >60 >60 >60  ANIONGAP 5 10 9     Lipids No results for input(s): "CHOL", "TRIG", "HDL", "LABVLDL", "LDLCALC", "CHOLHDL" in the last 168 hours.  Hematology Recent Labs  Lab 04/29/22 0227 04/30/22 0052 05/01/22 0145  WBC 7.8 7.9 8.9  RBC 3.64* 3.76* 3.90*  HGB 11.3* 11.8* 12.2*  HCT 34.3* 35.6* 36.8*  MCV 94.2 94.7 94.4  MCH 31.0 31.4 31.3  MCHC 32.9 33.1 33.2  RDW 16.3* 16.1* 15.9*  PLT 220 222 220  Thyroid  Recent Labs  Lab 04/28/22 0307  TSH 0.878    BNPNo results for input(s): "BNP", "PROBNP" in the last 168 hours.  DDimer No results for input(s): "DDIMER" in the last 168 hours.   Radiology    DG Chest 2 View  Result Date: 05/03/2022 CLINICAL DATA:  Post pacemaker placement EXAM: CHEST - 2 VIEW COMPARISON:  10/04/2021 FINDINGS: New RIGHT subclavian sequential pacemaker with leads projecting at RIGHT atrium and RIGHT ventricle. Normal heart size, mediastinal contours, and pulmonary vascularity. Atherosclerotic calcification aorta. Emphysematous changes with biapical scarring. No acute infiltrate, pleural effusion, or pneumothorax. Bones demineralized with probable BILATERAL chronic rotator cuff tears. IMPRESSION: New pacemaker without pneumothorax. COPD changes with biapical scarring. Aortic Atherosclerosis (ICD10-I70.0) and Emphysema (ICD10-J43.9). Electronically Signed   By: Ulyses Southward M.D.   On: 05/03/2022 08:23     Cardiac Studies   04/28/22; TTE 1. Left ventricular ejection fraction, by estimation, is 60 to  65%. The  left ventricle has normal function. The left ventricle has no regional  wall motion abnormalities. There is mild left ventricular hypertrophy.  Left ventricular diastolic parameters  are consistent with Grade II diastolic dysfunction (pseudonormalization).   2. Right ventricular systolic function is normal. The right ventricular  size is normal.   3. The mitral valve is normal in structure. Mild mitral valve  regurgitation. No evidence of mitral stenosis. Moderate mitral annular  calcification.   4. The aortic valve is tricuspid. Aortic valve regurgitation is not  visualized. Aortic valve sclerosis is present, with no evidence of aortic  valve stenosis.   5. The inferior vena cava is normal in size with greater than 50%  respiratory variability, suggesting right atrial pressure of 3 mmHg.   Patient Profile     81 y.o. male with a hx of PVD (known occl R ICA), CAD,  CAD (non-obstructive by cath 2007 with hx of prior PCI to RCA), HTN, HLD, COPD admitted after a fall (unclear mechanism vs faint) and AMS  Cardiology > EP called for episode of CHB with long pause  Assessment & Plan     Falls/syncope Very unclear, pt insists he is falling/tripping, denies syncope but has fallen more then once in a few months it seems Family by record found him on the garage floor, awake and confused ? CHB could be it   CHB No nodal blocking agents Significant hypokalemia, not felt to explain his CHB   S/p PPM yesterday Site is stable Device check this AM is with stable measurements/findings CXR this AM with stable lead position, no ptx Wound care and activiy instructions were reviewed with the patient EKG is pending Follow up is in place     PVCs, NSVT Most likely 2/2 low potassium K+ is better so is V ectopy burden No NSVT last 24 hours    4. AMS AAO x4 currently again today He denies syncope but can not tell us exactly how he fell Sounds like he has had in general increased  depression, and functional decline at home since his wife transitioned to a SNF ? +/- COPD exacerbation with poor compliance at home reported with his O2 and nebs     Once EKG is completed he can discharge from EP perspective Dr. Elberta Fortis has seen him Follow up is in place   For questions or updates, please contact [facility] Please consult [email or web address] for contact info under        Signed, Sheilah Pigeon, PA-C  05/03/2022, 8:51 AM

## 2022-05-03 NOTE — Progress Notes (Addendum)
Attempted to call patient's spouse and family for transportation to disposition and to review AVS, no one answer. Will try again.   Spoke to Mr. Louthan's daughter, [name], she will come and provide transportation to disposition as soon as she can. RN will review AVS to both patient and daughter when [name] gets here.

## 2022-05-03 NOTE — Progress Notes (Signed)
Occupational Therapy Treatment Patient Details Name: BERTEL KONEFAL MRN: [id number] DOB: Sep 03, 1941 Today's Date: 05/03/2022   History of present illness Pt is an 81 y/o male who presents 04/27/22 after being found down on the garage floor, awake and confused. MRI negative for acute changes. Underwent PPM placement 7/6. PMH significant for asthma, CAD, essential HTN, heart murmur, MI, R inguinal hernia s/p repair 11/01/2016, ventral hernia repair, ORIF shoulder fracture, multiple falls.   OT comments  Pt greeted seated in recliner agreeable to OT intervention. Pt with no recall of precautions from previous PT session. Pt continues to present with impaired balance, impaired recall of precautions and impaired cognition as pt reporting to this OTA that he lives with his son and daughter, daughter later confirmed that he lived alone. Pt currently requires MIN A for functional ambulation with Rw and MIN A for UB ADLs d/t ICD restrictions. Recommended that pt have 24/7 supervision at home to both daughter and son d/t cognitive deficits and balance impairments. Pt would continue to benefit from skilled occupational therapy while admitted and after d/c to address the below listed limitations in order to improve overall functional mobility and facilitate independence with BADL participation. DC plan remains appropriate, will follow acutely per POC.                        Recommendations for follow up therapy are one component of a multi-disciplinary discharge planning process, led by the attending physician.  Recommendations may be updated based on patient status, additional functional criteria and insurance authorization.    Follow Up Recommendations  Home health OT    Assistance Recommended at Discharge Frequent or constant Supervision/Assistance  Patient can return home with the following  A little help with walking and/or transfers;A lot of help with bathing/dressing/bathroom;Assistance with  cooking/housework;Direct supervision/assist for medications management;Direct supervision/assist for financial management;Assist for transportation;Help with stairs or ramp for entrance   Equipment Recommendations  BSC/3in1    Recommendations for Other Services      Precautions / Restrictions Precautions Precautions: Fall;ICD/Pacemaker Precaution Comments: educated pt on pacemaker precautions and provided handout to increase carryover Required Braces or Orthoses: Sling (initially to cue for pacemaker precautions) Restrictions Weight Bearing Restrictions: No       Mobility Bed Mobility               General bed mobility comments: seated in recliner    Transfers Overall transfer level: Needs assistance Equipment used: Rolling walker (2 wheels) Transfers: Sit to/from Stand Sit to Stand: Min assist           General transfer comment: MINA to rise from recliner     Balance Overall balance assessment: Needs assistance Sitting-balance support: Feet supported, No upper extremity supported Sitting balance-Leahy Scale: Fair     Standing balance support: Bilateral upper extremity supported, During functional activity, Reliant on assistive device for balance Standing balance-Leahy Scale: Poor Standing balance comment: BUE supported                           ADL either performed or assessed with clinical judgement   ADL Overall ADL's : Needs assistance/impaired                 Upper Body Dressing : Minimal assistance;Sitting       Toilet Transfer: Min guard;Ambulation;Rolling walker (2 wheels) Toilet Transfer Details (indicate cue type and reason): simulated via functional mobility  Functional mobility during ADLs: Minimal assistance;Rolling walker (2 wheels) General ADL Comments: education provided on energy conservation strategies, fall prevention and ICD precautions related to ADLs    Extremity/Trunk Assessment Upper Extremity  Assessment Upper Extremity Assessment: Generalized weakness (limited by ICD precautions)   Lower Extremity Assessment Lower Extremity Assessment: Defer to PT evaluation        Vision Baseline Vision/History: 0 No visual deficits (per gross assessment)     Perception Perception Perception: Not tested   Praxis Praxis Praxis: Not tested    Cognition Arousal/Alertness: Awake/alert Behavior During Therapy: Flat affect, Agitated (mild moments of agitation/annoyed) Overall Cognitive Status: Impaired/Different from baseline Area of Impairment: Safety/judgement, Memory, Awareness, Problem solving                     Memory: Decreased short-term memory, Decreased recall of precautions   Safety/Judgement: Decreased awareness of safety, Decreased awareness of deficits Awareness: Intellectual Problem Solving: Slow processing, Difficulty sequencing, Decreased initiation, Requires verbal cues General Comments: decreased recall of precautions/education from previous PT session, telling OTA initailly that he lives at home with his daughter and son which daughter later reports in untrue        Exercises      Shoulder Instructions       General Comments pt on RA during session with Spo2 >90%, handouts provided on ICD precautions, energy conservation and fall prevention. lengthy conversation with daughter on recommendation of pt needing 24/7 supervision/assistance at home, daughter reports they are doing the best they can to provide level of assistance    Pertinent Vitals/ Pain       Pain Assessment Pain Assessment: No/denies pain  Home Living                                          Prior Functioning/Environment              Frequency  Min 2X/week        Progress Toward Goals  OT Goals(current goals can now be found in the care plan section)  Progress towards OT goals: Progressing toward goals  Acute Rehab OT Goals Patient Stated Goal: to go  home OT Goal Formulation: With patient Time For Goal Achievement: 05/12/22 Potential to Achieve Goals: Good  Plan Discharge plan remains appropriate;Frequency remains appropriate    Co-evaluation                 AM-PAC OT "6 Clicks" Daily Activity     Outcome Measure   Help from another person eating meals?: None Help from another person taking care of personal grooming?: A Little Help from another person toileting, which includes using toliet, bedpan, or urinal?: A Little Help from another person bathing (including washing, rinsing, drying)?: A Lot Help from another person to put on and taking off regular upper body clothing?: A Little Help from another person to put on and taking off regular lower body clothing?: A Lot 6 Click Score: 17    End of Session Equipment Utilized During Treatment: Gait belt;Rolling walker (2 wheels);Other (comment) (sling for ICD)  OT Visit Diagnosis: Unsteadiness on feet (R26.81);Other abnormalities of gait and mobility (R26.89);Muscle weakness (generalized) (M62.81);Other symptoms and signs involving cognitive function   Activity Tolerance Patient tolerated treatment well   Patient Left in chair;with call bell/phone within reach;with family/visitor present   Nurse Communication Mobility status  Time: 3968-8648 OT Time Calculation (min): 26 min  Charges: OT General Charges $OT Visit: 1 Visit OT Treatments $Self Care/Home Management : 23-37 mins  Lenor Derrick., COTA/L Acute Rehabilitation Services [phone]   Barron Schmid 05/03/2022, 4:03 PM

## 2022-05-03 NOTE — Telephone Encounter (Signed)
Requesting referral for Rehabilitation for 2 weeks after hospital visit. Pt.daughter does not want him to go to  [facility].

## 2022-05-03 NOTE — Plan of Care (Signed)
Patient with expected discharge, declined his oral morning medication this AM stating that he will takes them when he gets home. EKG completed. Heartcare team PA notified.   Problem: Education: Goal: Knowledge of disease or condition will improve Outcome: Adequate for Discharge Goal: Knowledge of the prescribed therapeutic regimen will improve Outcome: Adequate for Discharge Goal: Individualized Educational Video(s) Outcome: Not Applicable   Problem: Activity: Goal: Ability to tolerate increased activity will improve Outcome: Adequate for Discharge Goal: Will verbalize the importance of balancing activity with adequate rest periods Outcome: Adequate for Discharge   Problem: Respiratory: Goal: Ability to maintain a clear airway will improve Outcome: Not Applicable Goal: Levels of oxygenation will improve Outcome: Not Applicable Goal: Ability to maintain adequate ventilation will improve Outcome: Not Applicable

## 2022-05-03 NOTE — Discharge Summary (Signed)
Physician Discharge Summary   Patient: [name] MRN: [id number] DOB: 1941-07-28  Admit date:     04/27/2022  Discharge date: 05/03/22  Discharge Physician: Patrecia Pour   PCP: Janith Lima, MD   Recommendations at discharge:  Follow up with PCP, suggest monitoring BMP, magnesium, and CBC.  Follow up with EP as arranged s/p pacemaker 7/6 for intermittent CHB and syncope. Wound care and activity instructions reviewed. Continue tobacco cessation counseling, consider pulmonary follow up for COPD, treated for exacerbation this admission. Follow up with urology after discharge for work up of hematuria including CT and cystoscopy if this persists.    Discharge Diagnoses: Principal Problem:   Encephalopathy Active Problems:   Complete heart block (HCC)   Acute metabolic encephalopathy   COPD with acute exacerbation ([place])   Fall at home, initial encounter   Stroke-like symptoms   Hypokalemia   Hematuria   Hyperkalemia   AKI (acute kidney injury) ([place])   Protein-calorie malnutrition, severe ([place])   Essential hypertension   Depression   Tobacco abuse   Hypertrophic toenail   Acute encephalopathy   Pressure injury of skin  Resolved Problems:   * No resolved hospital problems. *  Hospital Course: [name] is a 81 y.o. male with medical history significant of COPD Gold stage III, chronic hypoxic failure, noncompliant with home O2 and COPD medications, HTN, HLD, CAD status post stenting, cigarette smoke, presented with fall and altered mentations.   He was seen by neurology due to suspected stroke.  Symptoms thought possibly related to fall related to COPD exacerbation (confusion related to hypoxia or hypercarbia -> none documented on blood gas).  He had 7 second pause and EP performed pacemaker placement on 7/6 with no subsequent complications. He's been cleared for discharge in stable condition on 7/7.  Assessment and Plan: Complete heart block: 7 second pause on telemetry  noted 7/3. Asymptomatic. TSH 0.878. Echo with LVEF 60-65%, no WMA, G2DD, LVH, normal RV, mild MR. - Cardiology consulted > EP > pacemaker 7/6, postprocedure CXR and ECG look good, cleared for DC per EP who has arranged follow up and reviewed wound care and activity instructions   Acute metabolic encephalopathy: Due to COPD exacerbation, improved. B12, TSH wnl.  VBG without hypercarbia - Delirium precautions   AECOPD: Wheezing improved.  - Completed 5 days of prednisone, doxycycline - Continue scheduled and prn nebs.  - Continue smoking cessation efforts   Stroke-like symptoms, chronic right ICA occlusion: Code stroke was called on admission, thought to have mild R sided motor deficits, mild ataxia/cognitive/communication deficits. MRI brain without acute abnormality. CTA head/neck with right ICA occluded at bifurcation with reconstitution at level of R posterior communicating artery, less than 50% stenosis of L ICA at bifurcation, moderate stenosis of up to 70% in the R vertebral artery as it enters spinal canal at C6, moderate stenosis of proximal L posterior cerebral artery. Echo with EF 60-65%, grade II diastolic dysfunction  - ASA recommended for CVA ppx per neurology.   - Rosuvastatin 40mg , zetia 10mg  continued for HLD. Needs cardiology follow up for this.   Fall at home, initial encounter: Found down on garage floor, unclear how long he'd been there. Suspected related to COPD exacerbation/deconditioning vs complete heart block. - Fall precautions, home health arranged and increased level of supervision at home recommended at discharge, will be provided by family.     Hypokalemia: Resolved with supplementation.    Hypophosphatemia: Resolved.   Hematuria:  - Follow  up with urology after discharge for work up including CT and cystoscopy if this persists.     AKI: Improved with IVF, at suspected baseline.    Severe protein calorie malnutrition, hypoalbuminemia:  - Supplement protein as  able.   Essential hypertension - Continue HCTZ   Depression - Psychiatry consult appreciated. Mood has normalized without daily medications.   Tobacco use:  - Cessation counseling provided.  - Nicotine patch while admitted   Hypertrophic toenail - Consider podiatry follow up   Stage 2 sacral pressure injury POA:  - Offload, optimize nutritional status.    AOCD: Normocytic, stable, monitor at follow up.    AST elevation: Resolved prior to discharge without targeted intervention.  Consultants: Cardiology, EP, neurology, psychiatry Procedures performed: PPM placement 7/6  Disposition: Home Diet recommendation:  Cardiac diet DISCHARGE MEDICATION: Allergies as of 05/03/2022       Reactions   Penicillins Hives, Swelling   Has patient had a PCN reaction causing immediate rash, facial/tongue/throat swelling, SOB or lightheadedness with hypotension: Yes Has patient had a PCN reaction causing severe rash involving mucus membranes or skin necrosis: Yes Has patient had a PCN reaction that required hospitalization No Has patient had a PCN reaction occurring within the last 10 years: No If all of the above answers are "NO", then may proceed with Cephalosporin use.        Medication List     TAKE these medications    albuterol 108 (90 Base) MCG/ACT inhaler Commonly known as: VENTOLIN HFA Inhale 2 puffs into the lungs every 6 (six) hours as needed for wheezing or shortness of breath. What changed: Another medication with the same name was changed. Make sure you understand how and when to take each.   albuterol (2.5 MG/3ML) 0.083% nebulizer solution Commonly known as: PROVENTIL INHALE THE CONTENTS OF 1 VIAL INTO THE LUNGS VIA NEBULIZER EVERY 6 HOURS AS NEEDED FOR WHEEZING OR SHORTNESS OF BREATH What changed: See the new instructions.   aspirin EC 81 MG tablet Take 1 tablet (81 mg total) by mouth daily.   ezetimibe 10 MG tablet Commonly known as: ZETIA Take 1 tablet (10 mg  total) by mouth daily. What changed:  how much to take how to take this when to take this additional instructions   hydrochlorothiazide 12.5 MG capsule Commonly known as: MICROZIDE Take 1 capsule (12.5 mg total) by mouth daily.   rosuvastatin 40 MG tablet Commonly known as: CRESTOR Take 1 tablet (40 mg total) by mouth daily. What changed:  how much to take how to take this when to take this additional instructions               Durable Medical Equipment  (From admission, onward)           Start     Ordered   04/29/22 1335  For home use only DME Walker rolling  Once       Question Answer Comment  Walker: With [place] Wheels   Patient needs a walker to treat with the following condition Weakness      04/29/22 1334   04/29/22 1335  For home use only DME 3 n 1  Once        04/29/22 1334              Discharge Care Instructions  (From admission, onward)           Start     Ordered   05/03/22 0000  No dressing needed  05/03/22 1030            Follow-up Information     [facility] Follow up.   Specialty: Home Health Services Why: The home health agency will contact you for the first home visit. Contact information: [place] [address] [phone]         Janith Lima, MD. Schedule an appointment as soon as possible for a visit in 1 week(s).   Specialty: Internal Medicine Contact information: [place] [address] [phone]         Vickie Epley, MD Follow up.   Specialties: Cardiology, Radiology Contact information: [place] [place] [address] [phone]                Discharge Exam: Danley Danker Weights   04/27/22 1136 04/28/22 0030  Weight: 57.6 kg 55.6 kg  BP (!) 159/85   Pulse (!) 55   Temp 98.6 F (37 C) (Oral)   Resp 20   Ht 5\' 9"  (1.753 m)   Wt 55.6 kg   SpO2 92%   BMI 18.09 kg/m   No distress, elderly  male Clear, diminished without wheezes or crackles.  Pacemaker site c/d/I without discharge. Very mildly tender.  Condition at discharge: stable  The results of significant diagnostics from this hospitalization (including imaging, microbiology, ancillary and laboratory) are listed below for reference.   Imaging Studies: DG Chest 2 View  Result Date: 05/03/2022 CLINICAL DATA:  Post pacemaker placement EXAM: CHEST - 2 VIEW COMPARISON:  10/04/2021 FINDINGS: New RIGHT subclavian sequential pacemaker with leads projecting at RIGHT atrium and RIGHT ventricle. Normal heart size, mediastinal contours, and pulmonary vascularity. Atherosclerotic calcification aorta. Emphysematous changes with biapical scarring. No acute infiltrate, pleural effusion, or pneumothorax. Bones demineralized with probable BILATERAL chronic rotator cuff tears. IMPRESSION: New pacemaker without pneumothorax. COPD changes with biapical scarring. Aortic Atherosclerosis (ICD10-I70.0) and Emphysema (ICD10-J43.9). Electronically Signed   By: Lavonia Dana M.D.   On: 05/03/2022 08:23   EP PPM/ICD IMPLANT  Result Date: 05/02/2022  CONCLUSIONS:  1. Intermittent complete heart block and syncope  2. Dual chamber permanent pacemaker implant with left bundle area lead  3.  No early apparent complications.   ECHOCARDIOGRAM COMPLETE  Result Date: 04/28/2022    ECHOCARDIOGRAM REPORT   Patient Name:   [name] [facility] Date of Exam: 04/28/2022 Medical Rec #:  [id number]     Height:       71.0 in Accession #:    [id number]    Weight:       122.5 lb Date of Birth:  Oct 13, 1941    BSA:          1.712 m Patient Age:    70 years      BP:           121/67 mmHg Patient Gender: M             HR:           55 bpm. Exam Location:  Inpatient Procedure: 2D Echo, Color Doppler and Cardiac Doppler Indications:    Stroke i63.9  History:        Patient has prior history of Echocardiogram examinations, most                 recent 01/23/2017. COPD; Risk Factors:Hypertension and                  Dyslipidemia.  Sonographer:  [facility] Senior RDCS Referring Phys: [id number] [place]  1. Left ventricular ejection fraction, by estimation, is 60 to 65%. The left ventricle has normal function. The left ventricle has no regional wall motion abnormalities. There is mild left ventricular hypertrophy. Left ventricular diastolic parameters are consistent with Grade II diastolic dysfunction (pseudonormalization).  2. Right ventricular systolic function is normal. The right ventricular size is normal.  3. The mitral valve is normal in structure. Mild mitral valve regurgitation. No evidence of mitral stenosis. Moderate mitral annular calcification.  4. The aortic valve is tricuspid. Aortic valve regurgitation is not visualized. Aortic valve sclerosis is present, with no evidence of aortic valve stenosis.  5. The inferior vena cava is normal in size with greater than 50% respiratory variability, suggesting right atrial pressure of 3 mmHg. FINDINGS  Left Ventricle: Left ventricular ejection fraction, by estimation, is 60 to 65%. The left ventricle has normal function. The left ventricle has no regional wall motion abnormalities. The left ventricular internal cavity size was normal in size. There is  mild left ventricular hypertrophy. Left ventricular diastolic parameters are consistent with Grade II diastolic dysfunction (pseudonormalization). Right Ventricle: The right ventricular size is normal. Right ventricular systolic function is normal. Left Atrium: Left atrial size was normal in size. Right Atrium: Right atrial size was normal in size. Pericardium: There is no evidence of pericardial effusion. Mitral Valve: The mitral valve is normal in structure. Moderate mitral annular calcification. Mild mitral valve regurgitation. No evidence of mitral valve stenosis. Tricuspid Valve: The tricuspid valve is normal in structure. Tricuspid valve regurgitation is mild . No evidence of tricuspid stenosis.  Aortic Valve: The aortic valve is tricuspid. Aortic valve regurgitation is not visualized. Aortic valve sclerosis is present, with no evidence of aortic valve stenosis. Pulmonic Valve: The pulmonic valve was normal in structure. Pulmonic valve regurgitation is trivial. No evidence of pulmonic stenosis. Aorta: The aortic root is normal in size and structure. Venous: The inferior vena cava is normal in size with greater than 50% respiratory variability, suggesting right atrial pressure of 3 mmHg. IAS/Shunts: No atrial level shunt detected by color flow Doppler.  LEFT VENTRICLE PLAX 2D LVIDd:         4.55 cm   Diastology LVIDs:         3.10 cm   LV e' medial:    7.18 cm/s LV PW:         1.15 cm   LV E/e' medial:  12.5 LV IVS:        1.05 cm   LV e' lateral:   7.40 cm/s LVOT diam:     1.95 cm   LV E/e' lateral: 12.2 LV SV:         80 LV SV Index:   47 LVOT Area:     2.99 cm  RIGHT VENTRICLE RV S prime:     12.50 cm/s TAPSE (M-mode): 2.0 cm LEFT ATRIUM             Index        RIGHT ATRIUM           Index LA diam:        3.80 cm 2.22 cm/m   RA Area:     14.40 cm LA Vol (A2C):   40.5 ml 23.66 ml/m  RA Volume:   34.00 ml  19.86 ml/m LA Vol (A4C):   34.5 ml 20.15 ml/m LA Biplane Vol: 40.7 ml 23.77 ml/m  AORTIC VALVE LVOT Vmax:  106.00 cm/s LVOT Vmean:  78.200 cm/s LVOT VTI:    0.267 m  AORTA Ao Root diam: 3.25 cm MITRAL VALVE MV Area (PHT): 3.58 cm    SHUNTS MV Decel Time: 212 msec    Systemic VTI:  0.27 m MV E velocity: 90.10 cm/s  Systemic Diam: 1.95 cm MV A velocity: 79.70 cm/s MV E/A ratio:  1.13 Olga Millers MD Electronically signed by Olga Millers MD Signature Date/Time: 04/28/2022/4:04:41 PM    Final    MR BRAIN WO CONTRAST  Result Date: 04/27/2022 CLINICAL DATA:  TIA. New onset of leg weakness. Question left parietal infarct. EXAM: MRI HEAD WITHOUT CONTRAST TECHNIQUE: Multiplanar, multiecho pulse sequences of the brain and surrounding structures were obtained without intravenous contrast. COMPARISON:   CT head without contrast and CTA head and neck 04/27/2022 FINDINGS: Brain: Atrophy and white matter changes are moderately advanced for age. The ventricles are proportionate to the degree of atrophy. No acute infarct, hemorrhage, or mass lesion is present. Remote lacunar infarct is present along the posterior limb of the left external capsule. Basal ganglia are intact. Insular ribbon is normal. The internal auditory canals are within normal limits. The brainstem and cerebellum are within normal limits. Vascular: Flow is present in the major intracranial arteries. Skull and upper cervical spine: The craniocervical junction is normal. Upper cervical spine is within normal limits. Marrow signal is unremarkable. Sinuses/Orbits: Small mastoid effusions are present. No obstructing nasopharyngeal lesion is present. The paranasal sinuses and mastoid air cells are otherwise clear. The globes and orbits are within normal limits. Other: IMPRESSION: 1. No acute intracranial abnormality. 2. Atrophy and white matter disease is moderately advanced for age. This likely reflects the sequela of chronic microvascular ischemia. 3. Remote lacunar infarct of the posterior limb of the left external capsule. Electronically Signed   By: Marin Roberts M.D.   On: 04/27/2022 18:27   CT ANGIO HEAD NECK W WO CM W PERF (CODE STROKE)  Result Date: 04/27/2022 CLINICAL DATA:  Code stroke. Question left parietal infarction. EXAM: CT ANGIOGRAPHY HEAD AND NECK CT PERFUSION BRAIN TECHNIQUE: Multidetector CT imaging of the head and neck was performed using the standard protocol during bolus administration of intravenous contrast. Multiplanar CT image reconstructions and MIPs were obtained to evaluate the vascular anatomy. Carotid stenosis measurements (when applicable) are obtained utilizing NASCET criteria, using the distal internal carotid diameter as the denominator. Multiphase CT imaging of the brain was performed following IV bolus  contrast injection. Subsequent parametric perfusion maps were calculated using RAPID software. RADIATION DOSE REDUCTION: This exam was performed according to the departmental dose-optimization program which includes automated exposure control, adjustment of the mA and/or kV according to patient size and/or use of iterative reconstruction technique. CONTRAST:  OMNIPAQUE IOHEXOL 350 MG/ML SOLN COMPARISON:  CT head without contrast 04/27/2022. FINDINGS: CTA NECK FINDINGS Aortic arch: Atherosclerotic calcifications are present at the aortic arch and great vessel origins without focal aneurysm or stenosis. Right carotid system: Atherosclerotic calcifications are present in the distal right common carotid artery. The right internal carotid artery is occluded. No reconstitution in the neck. Left carotid system: The left common carotid artery demonstrates some distal calcification. Dense calcifications are present at the bifurcation. The lumen is narrowed to 2.3 mm, less than 50% stenosis relative to the more normal distal left ICA. Vertebral arteries: The vertebral arteries are codominant. Both vertebral arteries originate from the subclavian arteries without significant stenosis. Moderate narrowing of up to 50% is present in the right vertebral artery  as it enters the spinal canal at C6. No other significant focal stenosis is present in either vertebral artery in the neck. Skeleton: Degenerative changes are present in the cervical spine with grade 1 anterolisthesis at C3-4 and C4-5. Marked endplate changes are present at C5-6 and C6-7. No focal osseous lesions are present. The patient is edentulous. Other neck: Diffuse subcutaneous edema is present in the neck. No discrete lesion or abscess is present. No significant adenopathy is present. The submandibular and parotid glands and ducts are within normal limits. No focal mucosal lesions are present. The thyroid is normal. Upper chest: Centrilobular emphysematous  changes are present. Scarring is present at both lung apices. Thoracic inlet is within normal limits. Review of the MIP images confirms the above findings CTA HEAD FINDINGS Anterior circulation: The right internal carotid artery is reconstituted at the level of the posterior communicating artery. Atherosclerotic calcifications are present within the cavernous left ICA without a significant stenosis relative to the ICA terminus. The left A1 segment is dominant. The anterior communicating artery is patent. The MCA scratched at the M1 segments are normal bilaterally. MCA bifurcations are within normal limits. The ACA and MCA branch vessels are within normal limits. Posterior circulation: Moderate stenosis is present at the dural margin of the right vertebral artery. The intracranial left vertebral artery is the dominant vessel. PICA origins are visualized and normal. Vertebrobasilar junction is normal. Basilar artery is normal. Both posterior cerebral arteries originate from basilar tip. Moderate stenosis is present in the proximal left posterior cerebral artery. Pial collateral vessels contribute. Venous sinuses: The dural sinuses are patent. The straight sinus deep cerebral veins are intact. Cortical veins are within normal limits. No significant vascular malformation is evident. Anatomic variants: Prominent right posterior communicating artery reconstituting the right ICA. Review of the MIP images confirms the above findings CT Brain Perfusion Findings: ASPECTS: 10/10 CBF (<30%) Volume: 56mL Perfusion (Tmax>6.0s) volume: 13mL Mismatch Volume: 20mL Infarction Location:N/A IMPRESSION: 1. CT perfusion demonstrates no acute infarct or significant area of ischemia. 2. 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 3. Less than 50% stenosis of the left internal carotid artery at the bifurcation. 4. Moderate stenosis of up to 70%  in the right vertebral artery as it enters the spinal canal at C6. 5. Moderate stenosis of the proximal left posterior cerebral artery with pial collaterals filling the territory. 6. Aortic Atherosclerosis (ICD10-I70.0) and Emphysema (ICD10-J43.9). Electronically Signed   By: San Morelle M.D.   On: 04/27/2022 12:36   CT HEAD CODE STROKE WO CONTRAST`  Result Date: 04/27/2022 CLINICAL DATA:  Code stroke.  New onset of leg weakness. Fell. EXAM: CT HEAD WITHOUT CONTRAST TECHNIQUE: Contiguous axial images were obtained from the base of the skull through the vertex without intravenous contrast. RADIATION DOSE REDUCTION: This exam was performed according to the departmental dose-optimization program which includes automated exposure control, adjustment of the mA and/or kV according to patient size and/or use of iterative reconstruction technique. COMPARISON:  None Available. FINDINGS: Brain: No acute infarct, hemorrhage, or mass lesion is present. Moderate atrophy and white matter changes are present bilaterally. Basal ganglia are intact. Insular ribbon is normal. No acute or focal cortical abnormality is present. The ventricles are proportionate to the degree of atrophy. No significant extraaxial fluid collection is present. The brainstem and cerebellum are within normal limits. Vascular: Atherosclerotic calcifications are present within the cavernous internal carotid arteries. Hyperdense vessel is  present. Skull: Calvarium is intact. No focal lytic or blastic lesions are present. No significant extracranial soft tissue lesion is present. Sinuses/Orbits: Shrunken maxillary sinuses are present bilaterally. No acute sinus disease is present. The globes and orbits are within normal limits. ASPECTS [facility] Stroke Program Early CT Score) - Ganglionic level infarction (caudate, lentiform nuclei, internal capsule, insula, M1-M3 cortex): 7/7 - Supraganglionic infarction (M4-M6 cortex): 3/3 Total score (0-10 with 10  being normal): 10/10 IMPRESSION: 1. No acute intracranial abnormality. 2. Moderate atrophy and white matter disease likely reflects the sequela of chronic microvascular ischemia. 3. ASPECTS is 10/10. * Electronically Signed   By: San Morelle M.D.   On: 04/27/2022 12:03    Microbiology: Results for orders placed or performed during the hospital encounter of 04/27/22  Resp Panel by RT-PCR (Flu A&B, Covid) Anterior Nasal Swab     Status: None   Collection Time: 04/27/22  8:25 PM   Specimen: Anterior Nasal Swab  Result Value Ref Range Status   SARS Coronavirus 2 by RT PCR NEGATIVE NEGATIVE Final    Comment: (NOTE) SARS-CoV-2 target nucleic acids are NOT DETECTED.  The SARS-CoV-2 RNA is generally detectable in upper respiratory specimens during the acute phase of infection. The lowest concentration of SARS-CoV-2 viral copies this assay can detect is 138 copies/mL. A negative result does not preclude SARS-Cov-2 infection and should not be used as the sole basis for treatment or other patient management decisions. A negative result may occur with  improper specimen collection/handling, submission of specimen other than nasopharyngeal swab, presence of viral mutation(s) within the areas targeted by this assay, and inadequate number of viral copies(<138 copies/mL). A negative result must be combined with clinical observations, patient history, and epidemiological information. The expected result is Negative.  Fact Sheet for Patients:  EntrepreneurPulse.com.au  Fact Sheet for Healthcare Providers:  IncredibleEmployment.be  This test is no t yet approved or cleared by the [place] FDA and  has been authorized for detection and/or diagnosis of SARS-CoV-2 by FDA under an Emergency Use Authorization (EUA). This EUA will remain  in effect (meaning this test can be used) for the duration of the COVID-19 declaration under Section 564(b)(1) of the  Act, 21 U.S.C.section 360bbb-3(b)(1), unless the authorization is terminated  or revoked sooner.       Influenza A by PCR NEGATIVE NEGATIVE Final   Influenza B by PCR NEGATIVE NEGATIVE Final    Comment: (NOTE) The Xpert Xpress SARS-CoV-2/FLU/RSV plus assay is intended as an aid in the diagnosis of influenza from Nasopharyngeal swab specimens and should not be used as a sole basis for treatment. Nasal washings and aspirates are unacceptable for Xpert Xpress SARS-CoV-2/FLU/RSV testing.  Fact Sheet for Patients: EntrepreneurPulse.com.au  Fact Sheet for Healthcare Providers: IncredibleEmployment.be  This test is not yet approved or cleared by the [place] FDA and has been authorized for detection and/or diagnosis of SARS-CoV-2 by FDA under an Emergency Use Authorization (EUA). This EUA will remain in effect (meaning this test can be used) for the duration of the COVID-19 declaration under Section 564(b)(1) of the Act, 21 U.S.C. section 360bbb-3(b)(1), unless the authorization is terminated or revoked.  Performed at [facility], [place] [address], [place], [place] [address]   Urine Culture     Status: Abnormal   Collection Time: 04/28/22  7:43 AM   Specimen: Urine, Clean Catch  Result Value Ref Range Status   Specimen Description URINE, CLEAN CATCH  Final   Special Requests   Final  NONE Performed at [facility], [address] [place], [place] [address]    Culture MULTIPLE SPECIES PRESENT, SUGGEST RECOLLECTION (A)  Final   Report Status 04/30/2022 FINAL  Final  Surgical PCR screen     Status: None   Collection Time: 05/02/22  6:20 AM   Specimen: Nasal Mucosa; Nasal Swab  Result Value Ref Range Status   MRSA, PCR NEGATIVE NEGATIVE Final   Staphylococcus aureus NEGATIVE NEGATIVE Final    Comment: (NOTE) The Xpert SA Assay (FDA approved for NASAL specimens in patients 43 years of age and older), is one component of a  comprehensive surveillance program. It is not intended to diagnose infection nor to guide or monitor treatment. Performed at [facility], [address] [place], [place] [address]     Labs: CBC: Recent Labs  Lab 04/27/22 1143 04/28/22 0307 04/29/22 0227 04/30/22 0052 05/01/22 0145  WBC 5.3 4.9 7.8 7.9 8.9  NEUTROABS 4.3  --  5.7 6.6 7.1  HGB 13.9 11.9* 11.3* 11.8* 12.2*  HCT 41.9 35.6* 34.3* 35.6* 36.8*  MCV 94.2 93.0 94.2 94.7 94.4  PLT 242 172 220 222 220   Basic Metabolic Panel: Recent Labs  Lab 04/29/22 0227 04/30/22 0052 05/01/22 0145 05/02/22 0107 05/03/22 0148  NA 138 138 136 134* 135  K 2.8* 3.1* 3.9 4.5 3.6  CL 98 97* 97* 92* 97*  CO2 31 32 34* 32 29  GLUCOSE 150* 141* 122* 126* 80  BUN 23 17 14 12 14   CREATININE 1.13 0.91 0.92 0.82 0.69  CALCIUM 8.4* 8.6* 8.5* 9.2 8.9  MG 2.0 1.9 1.9 1.8 1.8  PHOS 1.8* 2.2* 1.6* 3.1 2.9   Liver Function Tests: Recent Labs  Lab 04/29/22 0227 04/30/22 0052 05/01/22 0145 05/02/22 0107 05/03/22 0148  AST 41 43* 52* 71* 39  ALT 18 23 33 50* 40  ALKPHOS 70 69 72 78 74  BILITOT 0.4 0.4 0.3 0.4 0.4  PROT 4.5* 5.1* 5.2* 5.3* 5.1*  ALBUMIN 2.0* 2.1* 2.2* 2.3* 2.2*   CBG: Recent Labs  Lab 04/27/22 1132  GLUCAP 91    Discharge time spent: greater than 30 minutes.  Signed: 06/28/22, MD [facility] 05/03/2022

## 2022-05-03 NOTE — TOC Transition Note (Signed)
Transition of Care [facility]) - CM/SW Discharge Note   Patient Details  Name: [name] MRN: [id number] Date of Birth: October 13, 1941  [facility]) CM/SW Contact:  Kermit Balo, RN Phone Number: 05/03/2022, 10:20 AM   Clinical Narrative:    Pt is discharging home with home health services through [facility]. Cindy with [facility] aware of d/c home.  DME for home is at the bedside.  Pt has transportation home.    Final next level of care: Home w Home Health Services Barriers to Discharge: No Barriers Identified   Patient Goals and CMS Choice   CMS Medicare.gov Compare Post Acute Care list provided to:: Patient Represenative (must comment) Choice offered to / list presented to : Adult Children  Discharge Placement                       Discharge Plan and Services   Discharge Planning Services: CM Consult Post Acute Care Choice: Home Health, Durable Medical Equipment          DME Arranged: 3-N-1, Walker rolling DME Agency: [employer] Date DME Agency Contacted: 04/29/22   Representative spoke with at DME Agency: Delorise Jackson HH Arranged: Nurse's Aide, PT, OT, RN HH Agency: [facility] Date [facility] Agency Contacted: 04/29/22   Representative spoke with at [facility] Agency: Arline Asp  Social Determinants of Health (SDOH) Interventions     Readmission Risk Interventions     No data to display

## 2022-05-03 NOTE — Progress Notes (Signed)
Physical Therapy Treatment Patient Details Name: [name] MRN: [id number] DOB: January 02, 1941 Today's Date: 05/03/2022   History of Present Illness Pt is an 81 y/o male who presents 04/27/22 after being found down on the garage floor, awake and confused. MRI negative for acute changes. Underwent PPM placement 7/6. PMH significant for asthma, CAD, essential HTN, heart murmur, MI, R inguinal hernia s/p repair 11/01/2016, ventral hernia repair, ORIF shoulder fracture, multiple falls.    PT Comments    Pt underwent PPM placement yesterday afternoon. Today, he required min assist bed mobility, min assist transfers, and min assist ambulation in room with RW. Pt declining hallway ambulation. Mobilized on RA with SpO2 94%. Continuous cues required to maintain pacemaker precautions. No retention of education noted. Pt will need 24-hour assist from family upon d/c.     Recommendations for follow up therapy are one component of a multi-disciplinary discharge planning process, led by the attending physician.  Recommendations may be updated based on patient status, additional functional criteria and insurance authorization.  Follow Up Recommendations  Home health PT     Assistance Recommended at Discharge Frequent or constant Supervision/Assistance  Patient can return home with the following A lot of help with bathing/dressing/bathroom;Assistance with cooking/housework;Direct supervision/assist for medications management;Direct supervision/assist for financial management;Assist for transportation;Help with stairs or ramp for entrance;A little help with walking and/or transfers   Equipment Recommendations  Rolling walker (2 wheels)    Recommendations for Other Services       Precautions / Restrictions Precautions Precautions: Fall;ICD/Pacemaker Precaution Comments: educated pt on pacemaker precautions. Required Braces or Orthoses: Sling (initially to cue for pacemaker precautions)     Mobility  Bed  Mobility Overal bed mobility: Needs Assistance Bed Mobility: Supine to Sit     Supine to sit: HOB elevated, Min assist     General bed mobility comments: assist to scoot to EOB to minimize use of LUE    Transfers Overall transfer level: Needs assistance Equipment used: Rolling walker (2 wheels) Transfers: Sit to/from Stand Sit to Stand: Min assist           General transfer comment: assist to power up    Ambulation/Gait Ambulation/Gait assistance: Min assist Gait Distance (Feet): 15 Feet (x 2) Assistive device: Rolling walker (2 wheels) Gait Pattern/deviations: Decreased stride length, Shuffle, Step-through pattern, Trunk flexed, Drifts right/left Gait velocity: Decreased Gait velocity interpretation: <1.31 ft/sec, indicative of household ambulator   General Gait Details: Pt declining hallway amb.   Stairs             Wheelchair Mobility    Modified Rankin (Stroke Patients Only)       Balance Overall balance assessment: Needs assistance Sitting-balance support: Feet supported, No upper extremity supported Sitting balance-Leahy Scale: Fair     Standing balance support: Bilateral upper extremity supported, During functional activity, Reliant on assistive device for balance Standing balance-Leahy Scale: Poor                              Cognition Arousal/Alertness: Awake/alert Behavior During Therapy: Flat affect Overall Cognitive Status: Impaired/Different from baseline Area of Impairment: Orientation, Attention, Following commands, Memory, Safety/judgement, Awareness, Problem solving                 Orientation Level: Disoriented to, Time, Situation Current Attention Level: Sustained Memory: Decreased short-term memory Following Commands: Follows one step commands consistently, Follows one step commands with increased time, Follows multi-step commands inconsistently Safety/Judgement:  Decreased awareness of safety, Decreased  awareness of deficits Awareness: Intellectual Problem Solving: Slow processing, Difficulty sequencing, Decreased initiation, Requires verbal cues          Exercises      General Comments General comments (skin integrity, edema, etc.): Pt on 2L O2 on arrival (following yesterday's procedure). Mobilized on RA with SpO2 94%. Remained on RA at end of session.      Pertinent Vitals/Pain Pain Assessment Pain Assessment: No/denies pain    Home Living                          Prior Function            PT Goals (current goals can now be found in the care plan section) Acute Rehab PT Goals Patient Stated Goal: home Progress towards PT goals: Progressing toward goals    Frequency    Min 3X/week      PT Plan Current plan remains appropriate    Co-evaluation              AM-PAC PT "6 Clicks" Mobility   Outcome Measure  Help needed turning from your back to your side while in a flat bed without using bedrails?: A Little Help needed moving from lying on your back to sitting on the side of a flat bed without using bedrails?: A Little Help needed moving to and from a bed to a chair (including a wheelchair)?: A Little Help needed standing up from a chair using your arms (e.g., wheelchair or bedside chair)?: A Little Help needed to walk in hospital room?: A Little Help needed climbing 3-5 steps with a railing? : A Lot 6 Click Score: 17    End of Session Equipment Utilized During Treatment: Gait belt Activity Tolerance: Patient tolerated treatment well Patient left: in chair;with call bell/phone within reach;with chair alarm set Nurse Communication: Mobility status PT Visit Diagnosis: Unsteadiness on feet (R26.81);Other abnormalities of gait and mobility (R26.89);Difficulty in walking, not elsewhere classified (R26.2)     Time: 1110-1140 PT Time Calculation (min) (ACUTE ONLY): 30 min  Charges:  $Gait Training: 8-22 mins $Therapeutic Activity: 8-22  mins                     [name], PT  Office # [phone] Pager [phone]    Ilda Foil 05/03/2022, 11:49 AM

## 2022-05-06 LAB — VITAMIN B1: Vitamin B1 (Thiamine): 49 nmol/L — ABNORMAL LOW (ref 66.5–200.0)

## 2022-05-07 ENCOUNTER — Ambulatory Visit: Payer: Self-pay | Admitting: *Deleted

## 2022-05-07 ENCOUNTER — Telehealth: Payer: Self-pay

## 2022-05-07 DIAGNOSIS — I10 Essential (primary) hypertension: Secondary | ICD-10-CM

## 2022-05-07 DIAGNOSIS — E785 Hyperlipidemia, unspecified: Secondary | ICD-10-CM | POA: Diagnosis not present

## 2022-05-07 DIAGNOSIS — F32A Depression, unspecified: Secondary | ICD-10-CM | POA: Diagnosis not present

## 2022-05-07 DIAGNOSIS — J9611 Chronic respiratory failure with hypoxia: Secondary | ICD-10-CM | POA: Diagnosis not present

## 2022-05-07 DIAGNOSIS — I6529 Occlusion and stenosis of unspecified carotid artery: Secondary | ICD-10-CM | POA: Diagnosis not present

## 2022-05-07 DIAGNOSIS — Z9181 History of falling: Secondary | ICD-10-CM | POA: Diagnosis not present

## 2022-05-07 DIAGNOSIS — I252 Old myocardial infarction: Secondary | ICD-10-CM | POA: Diagnosis not present

## 2022-05-07 DIAGNOSIS — Z950 Presence of cardiac pacemaker: Secondary | ICD-10-CM | POA: Diagnosis not present

## 2022-05-07 DIAGNOSIS — E860 Dehydration: Secondary | ICD-10-CM | POA: Diagnosis not present

## 2022-05-07 DIAGNOSIS — I2510 Atherosclerotic heart disease of native coronary artery without angina pectoris: Secondary | ICD-10-CM | POA: Diagnosis not present

## 2022-05-07 DIAGNOSIS — E43 Unspecified severe protein-calorie malnutrition: Secondary | ICD-10-CM | POA: Diagnosis not present

## 2022-05-07 DIAGNOSIS — R011 Cardiac murmur, unspecified: Secondary | ICD-10-CM | POA: Diagnosis not present

## 2022-05-07 DIAGNOSIS — Z7982 Long term (current) use of aspirin: Secondary | ICD-10-CM | POA: Diagnosis not present

## 2022-05-07 DIAGNOSIS — F320 Major depressive disorder, single episode, mild: Secondary | ICD-10-CM

## 2022-05-07 DIAGNOSIS — Z9981 Dependence on supplemental oxygen: Secondary | ICD-10-CM | POA: Diagnosis not present

## 2022-05-07 DIAGNOSIS — I679 Cerebrovascular disease, unspecified: Secondary | ICD-10-CM

## 2022-05-07 DIAGNOSIS — J449 Chronic obstructive pulmonary disease, unspecified: Secondary | ICD-10-CM

## 2022-05-07 DIAGNOSIS — E8809 Other disorders of plasma-protein metabolism, not elsewhere classified: Secondary | ICD-10-CM | POA: Diagnosis not present

## 2022-05-07 DIAGNOSIS — I442 Atrioventricular block, complete: Secondary | ICD-10-CM | POA: Diagnosis not present

## 2022-05-07 DIAGNOSIS — J441 Chronic obstructive pulmonary disease with (acute) exacerbation: Secondary | ICD-10-CM | POA: Diagnosis not present

## 2022-05-07 DIAGNOSIS — F17210 Nicotine dependence, cigarettes, uncomplicated: Secondary | ICD-10-CM | POA: Diagnosis not present

## 2022-05-07 DIAGNOSIS — Z8719 Personal history of other diseases of the digestive system: Secondary | ICD-10-CM | POA: Diagnosis not present

## 2022-05-07 DIAGNOSIS — Z8701 Personal history of pneumonia (recurrent): Secondary | ICD-10-CM | POA: Diagnosis not present

## 2022-05-07 DIAGNOSIS — G9340 Encephalopathy, unspecified: Secondary | ICD-10-CM | POA: Diagnosis not present

## 2022-05-07 DIAGNOSIS — Z48812 Encounter for surgical aftercare following surgery on the circulatory system: Secondary | ICD-10-CM | POA: Diagnosis not present

## 2022-05-07 DIAGNOSIS — Z955 Presence of coronary angioplasty implant and graft: Secondary | ICD-10-CM | POA: Diagnosis not present

## 2022-05-07 DIAGNOSIS — Z8781 Personal history of (healed) traumatic fracture: Secondary | ICD-10-CM | POA: Diagnosis not present

## 2022-05-07 DIAGNOSIS — I700 Atherosclerosis of aorta: Secondary | ICD-10-CM | POA: Diagnosis not present

## 2022-05-07 NOTE — Chronic Care Management (AMB) (Signed)
CSW was asked to assist with family request to get pt back to rehab. CSW spoke with pt's daughter, [name], by phone on 05/06/22. Pt was released back to home after 2 hospitalizations and 2 SNF stays. Per family, they feel pt needs to return to rehab. CSW explained to daughter that based on PT notes from recent hospital stay, their recommendation is for HHPT. [facility] was arranged at time of discharge and is to begin this week; per daughter- the initial assessment is to happen 05/07/22. CSW has asked PCP to add HHSW to the orders to help with assessing for needs and support.  Pt has a LTC policy and may be appropriate for in-home caregiver/custodial care in addition to the HHservices. CSW explained to daughter that the pt has to be in need of and able to participate and progress with rehab at a SNF level; versus at home. Pt has a wife of 63 years who resides in memory care [facility]; family may also decide to pursue ALF placement there for him if needed.  Daughter reports pt is able to get up some on his own at home but they are providing 24/7 supervision as of now.  CSW provided daughter with direct # to contact this CSW to update once [facility] has determined their recommendations.  CSW has updated PCP and RNCM as well to above.  Reece Levy MSW, LCSW Licensed Clinical Social Worker CCM Coverage      [phone]

## 2022-05-07 NOTE — Telephone Encounter (Signed)
Transition Care Management Follow-up Telephone Call Date of discharge and from where: [facility] on 05/03/2022 Dx: G93.40 Encephalopathy, R29.90 Stroke-like episode, R41.82 Altered Mental Status How have you been since you were released from the hospital? Per daughter: "I'm very concerned that he may be depressed since mom was put in assistant living.  He is losing so much weight, due to not eating and all he wants to do is smoke cigarettes." Any questions or concerns? Yes  Items Reviewed: Did the pt receive and understand the discharge instructions provided? Yes  Medications obtained and verified? Yes  Other? No  Any new allergies since your discharge? No  Dietary orders reviewed? No Do you have support at home? Yes  "staying with my brother and I am close by as well"  Home Care and Equipment/Supplies: Were home health services ordered? yes If so, what is the name of the agency? [facility]  Has the agency set up a time to come to the patient's home? Yes; 05/07/2022 Were any new equipment or medical supplies ordered?  No What is the name of the medical supply agency? no Were you able to get the supplies/equipment? not applicable Do you have any questions related to the use of the equipment or supplies? No  Functional Questionnaire: (I = Independent and D = Dependent) ADLs: D  Bathing/Dressing- D  Meal Prep- D  Eating- I  Maintaining continence- I  Transferring/Ambulation- D  Managing Meds- D  Follow up appointments reviewed:  PCP Hospital f/u appt confirmed? Yes  Scheduled to see Oliver Barre, MD. on 05/13/2022 @ 2:20 pm. Specialist Hospital f/u appt confirmed? Yes  Scheduled to see Dr. Steffanie Dunn with Cardiology  Are transportation arrangements needed? No "either myself or my brother will bring him to appointment" If their condition worsens, is the pt aware to call PCP or go to the Emergency Dept.? Yes Was the patient provided with contact information for the PCP's  office or ED? Yes Was to pt encouraged to call back with questions or concerns? Yes

## 2022-05-08 DIAGNOSIS — I2510 Atherosclerotic heart disease of native coronary artery without angina pectoris: Secondary | ICD-10-CM | POA: Diagnosis not present

## 2022-05-08 DIAGNOSIS — G9340 Encephalopathy, unspecified: Secondary | ICD-10-CM | POA: Diagnosis not present

## 2022-05-08 DIAGNOSIS — J441 Chronic obstructive pulmonary disease with (acute) exacerbation: Secondary | ICD-10-CM | POA: Diagnosis not present

## 2022-05-08 DIAGNOSIS — I442 Atrioventricular block, complete: Secondary | ICD-10-CM | POA: Diagnosis not present

## 2022-05-08 DIAGNOSIS — J9611 Chronic respiratory failure with hypoxia: Secondary | ICD-10-CM | POA: Diagnosis not present

## 2022-05-08 DIAGNOSIS — Z48812 Encounter for surgical aftercare following surgery on the circulatory system: Secondary | ICD-10-CM | POA: Diagnosis not present

## 2022-05-09 ENCOUNTER — Telehealth: Payer: Self-pay | Admitting: Psychiatry

## 2022-05-09 DIAGNOSIS — I2510 Atherosclerotic heart disease of native coronary artery without angina pectoris: Secondary | ICD-10-CM | POA: Diagnosis not present

## 2022-05-09 DIAGNOSIS — J441 Chronic obstructive pulmonary disease with (acute) exacerbation: Secondary | ICD-10-CM | POA: Diagnosis not present

## 2022-05-09 DIAGNOSIS — Z48812 Encounter for surgical aftercare following surgery on the circulatory system: Secondary | ICD-10-CM | POA: Diagnosis not present

## 2022-05-09 DIAGNOSIS — I442 Atrioventricular block, complete: Secondary | ICD-10-CM | POA: Diagnosis not present

## 2022-05-09 DIAGNOSIS — G9340 Encephalopathy, unspecified: Secondary | ICD-10-CM | POA: Diagnosis not present

## 2022-05-09 DIAGNOSIS — J9611 Chronic respiratory failure with hypoxia: Secondary | ICD-10-CM | POA: Diagnosis not present

## 2022-05-09 NOTE — Telephone Encounter (Signed)
Spoke briefly to pt's daughter. Discussed low thiamine level which resulted after dc- pt had been getting supplemented when he was here. Recommended continued supplementation.

## 2022-05-10 DIAGNOSIS — J441 Chronic obstructive pulmonary disease with (acute) exacerbation: Secondary | ICD-10-CM | POA: Diagnosis not present

## 2022-05-10 DIAGNOSIS — I2510 Atherosclerotic heart disease of native coronary artery without angina pectoris: Secondary | ICD-10-CM | POA: Diagnosis not present

## 2022-05-10 DIAGNOSIS — I442 Atrioventricular block, complete: Secondary | ICD-10-CM | POA: Diagnosis not present

## 2022-05-10 DIAGNOSIS — Z48812 Encounter for surgical aftercare following surgery on the circulatory system: Secondary | ICD-10-CM | POA: Diagnosis not present

## 2022-05-10 DIAGNOSIS — J9611 Chronic respiratory failure with hypoxia: Secondary | ICD-10-CM | POA: Diagnosis not present

## 2022-05-10 DIAGNOSIS — G9340 Encephalopathy, unspecified: Secondary | ICD-10-CM | POA: Diagnosis not present

## 2022-05-13 ENCOUNTER — Encounter: Payer: Self-pay | Admitting: Internal Medicine

## 2022-05-13 ENCOUNTER — Ambulatory Visit (INDEPENDENT_AMBULATORY_CARE_PROVIDER_SITE_OTHER): Payer: Medicare Other | Admitting: Internal Medicine

## 2022-05-13 VITALS — BP 134/70 | HR 40 | Temp 98.1°F | Ht 69.0 in | Wt 115.0 lb

## 2022-05-13 DIAGNOSIS — E43 Unspecified severe protein-calorie malnutrition: Secondary | ICD-10-CM | POA: Diagnosis not present

## 2022-05-13 DIAGNOSIS — D538 Other specified nutritional anemias: Secondary | ICD-10-CM | POA: Diagnosis not present

## 2022-05-13 DIAGNOSIS — M159 Polyosteoarthritis, unspecified: Secondary | ICD-10-CM | POA: Diagnosis not present

## 2022-05-13 DIAGNOSIS — I679 Cerebrovascular disease, unspecified: Secondary | ICD-10-CM

## 2022-05-13 DIAGNOSIS — E519 Thiamine deficiency, unspecified: Secondary | ICD-10-CM

## 2022-05-13 DIAGNOSIS — I442 Atrioventricular block, complete: Secondary | ICD-10-CM | POA: Diagnosis not present

## 2022-05-13 DIAGNOSIS — J9612 Chronic respiratory failure with hypercapnia: Secondary | ICD-10-CM

## 2022-05-13 DIAGNOSIS — Z48812 Encounter for surgical aftercare following surgery on the circulatory system: Secondary | ICD-10-CM | POA: Diagnosis not present

## 2022-05-13 DIAGNOSIS — Z515 Encounter for palliative care: Secondary | ICD-10-CM | POA: Diagnosis not present

## 2022-05-13 DIAGNOSIS — J4540 Moderate persistent asthma, uncomplicated: Secondary | ICD-10-CM | POA: Diagnosis not present

## 2022-05-13 DIAGNOSIS — F322 Major depressive disorder, single episode, severe without psychotic features: Secondary | ICD-10-CM

## 2022-05-13 DIAGNOSIS — J9611 Chronic respiratory failure with hypoxia: Secondary | ICD-10-CM | POA: Diagnosis not present

## 2022-05-13 DIAGNOSIS — E119 Type 2 diabetes mellitus without complications: Secondary | ICD-10-CM | POA: Insufficient documentation

## 2022-05-13 DIAGNOSIS — E1169 Type 2 diabetes mellitus with other specified complication: Secondary | ICD-10-CM

## 2022-05-13 DIAGNOSIS — G9340 Encephalopathy, unspecified: Secondary | ICD-10-CM | POA: Diagnosis not present

## 2022-05-13 DIAGNOSIS — I2510 Atherosclerotic heart disease of native coronary artery without angina pectoris: Secondary | ICD-10-CM | POA: Diagnosis not present

## 2022-05-13 DIAGNOSIS — J441 Chronic obstructive pulmonary disease with (acute) exacerbation: Secondary | ICD-10-CM | POA: Diagnosis not present

## 2022-05-13 MED ORDER — FENTANYL 12 MCG/HR TD PT72: 1.0000 | MEDICATED_PATCH | TRANSDERMAL | 0 refills | Status: DC

## 2022-05-13 MED ORDER — MIRTAZAPINE 7.5 MG PO TABS: 7.5000 mg | ORAL_TABLET | Freq: Every day | ORAL | 0 refills | Status: DC

## 2022-05-13 MED ORDER — VITAMIN B-1 50 MG PO TABS: 50.0000 mg | ORAL_TABLET | Freq: Every day | ORAL | 1 refills | Status: DC

## 2022-05-13 NOTE — Patient Instructions (Signed)
Anemia  Anemia is a condition in which there is not enough red blood cells or hemoglobin in the blood. Hemoglobin is a substance in red blood cells that carries oxygen. When you do not have enough red blood cells or hemoglobin (are anemic), your body cannot get enough oxygen and your organs may not work properly. As a result, you may feel very tired or have other problems. What are the causes? Common causes of anemia include: Excessive bleeding. Anemia can be caused by excessive bleeding inside or outside the body, including bleeding from the intestines or from heavy menstrual periods in females. Poor nutrition. Long-lasting (chronic) kidney, thyroid, and liver disease. Bone marrow disorders, spleen problems, and blood disorders. Cancer and treatments for cancer. HIV (human immunodeficiency virus) and AIDS (acquired immunodeficiency syndrome). Infections, medicines, and autoimmune disorders that destroy red blood cells. What are the signs or symptoms? Symptoms of this condition include: Minor weakness. Dizziness. Headache, or difficulties concentrating and sleeping. Heartbeats that feel irregular or faster than normal (palpitations). Shortness of breath, especially with exercise. Pale skin, lips, and nails, or cold hands and feet. Indigestion and nausea. Symptoms may occur suddenly or develop slowly. If your anemia is mild, you may not have symptoms. How is this diagnosed? This condition is diagnosed based on blood tests, your medical history, and a physical exam. In some cases, a test may be needed in which cells are removed from the soft tissue inside of a bone and looked at under a microscope (bone marrow biopsy). Your health care provider may also check your stool (feces) for blood and may do additional testing to look for the cause of your bleeding. Other tests may include: Imaging tests, such as a CT scan or MRI. A procedure to see inside your esophagus and stomach (endoscopy). A  procedure to see inside your colon and rectum (colonoscopy). How is this treated? Treatment for this condition depends on the cause. If you continue to lose a lot of blood, you may need to be treated at a hospital. Treatment may include: Taking supplements of iron, vitamin B12, or folic acid. Taking a hormone medicine (erythropoietin) that can help to stimulate red blood cell growth. Having a blood transfusion. This may be needed if you lose a lot of blood. Making changes to your diet. Having surgery to remove your spleen. Follow these instructions at home: Take over-the-counter and prescription medicines only as told by your health care provider. Take supplements only as told by your health care provider. Follow any diet instructions that you were given by your health care provider. Keep all follow-up visits as told by your health care provider. This is important. Contact a health care provider if: You develop new bleeding anywhere in the body. Get help right away if: You are very weak. You are short of breath. You have pain in your abdomen or chest. You are dizzy or feel faint. You have trouble concentrating. You have bloody stools, black stools, or tarry stools. You vomit repeatedly or you vomit up blood. These symptoms may represent a serious problem that is an emergency. Do not wait to see if the symptoms will go away. Get medical help right away. Call your local emergency services (911 in the [place] Do not drive yourself to the hospital. Summary Anemia is a condition in which you do not have enough red blood cells or enough of a substance in your red blood cells that carries oxygen (hemoglobin). Symptoms may occur suddenly or develop slowly. If your anemia   is mild, you may not have symptoms. This condition is diagnosed with blood tests, a medical history, and a physical exam. Other tests may be needed. Treatment for this condition depends on the cause of the anemia. This  information is not intended to replace advice given to you by your health care provider. Make sure you discuss any questions you have with your health care provider. Document Revised: 08/28/2021 Document Reviewed: 09/21/2019 Elsevier Patient Education  2023 [employer]  

## 2022-05-13 NOTE — Progress Notes (Signed)
Subjective:  Patient ID: [name], male    DOB: 15-Oct-1941  Age: 81 y.o. MRN: [id number]  CC: Anemia and COPD   HPI [name] presents for f/up -  Patient: [name] MRN: [id number] DOB: 05/10/1941  Admit date:     04/27/2022  Discharge date: 05/03/22  Discharge Physician: Patrecia Pour    PCP: Devin Lima, MD   He was recently admitted after a fall.  He has been taking Goody powders for pain.  He complains of weight loss, ataxia, lower extremity edema, fatigue, sleepiness, and apathy.   Recommendations at discharge:  Follow up with PCP, suggest monitoring BMP, magnesium, and CBC.  Follow up with EP as arranged s/p pacemaker 7/6 for intermittent CHB and syncope. Wound care and activity instructions reviewed. Continue tobacco cessation counseling, consider pulmonary follow up for COPD, treated for exacerbation this admission. Follow up with urology after discharge for work up of hematuria including CT and cystoscopy if this persists.     Discharge Diagnoses: Principal Problem:   Encephalopathy Active Problems:   Complete heart block (HCC)   Acute metabolic encephalopathy   COPD with acute exacerbation ([place])   Fall at home, initial encounter   Stroke-like symptoms   Hypokalemia   Hematuria   Hyperkalemia   AKI (acute kidney injury) ([place])   Protein-calorie malnutrition, severe ([place])   Essential hypertension   Depression   Tobacco abuse   Hypertrophic toenail   Acute encephalopathy   Pressure injury of skin  No facility-administered medications prior to visit.   Outpatient Medications Prior to Visit  Medication Sig Dispense Refill   albuterol (PROVENTIL) (2.5 MG/3ML) 0.083% nebulizer solution INHALE THE CONTENTS OF 1 VIAL INTO THE LUNGS VIA NEBULIZER EVERY 6 HOURS AS NEEDED FOR WHEEZING OR SHORTNESS OF BREATH (Patient taking differently: Take 2.5 mg by nebulization every 6 (six) hours as needed for wheezing or shortness of breath. INHALE THE CONTENTS OF 1  VIAL INTO THE LUNGS VIA NEBULIZER EVERY 6 HOURS AS NEEDED FOR WHEEZING OR SHORTNESS OF BREATH) 75 mL 3   albuterol (VENTOLIN HFA) 108 (90 Base) MCG/ACT inhaler Inhale 2 puffs into the lungs every 6 (six) hours as needed for wheezing or shortness of breath. 8 g 11   aspirin EC 81 MG tablet Take 1 tablet (81 mg total) by mouth daily. 30 tablet 0   ezetimibe (ZETIA) 10 MG tablet Take 1 tablet (10 mg total) by mouth daily. 30 tablet 0   hydrochlorothiazide (MICROZIDE) 12.5 MG capsule Take 1 capsule (12.5 mg total) by mouth daily. 30 capsule 0   rosuvastatin (CRESTOR) 40 MG tablet Take 1 tablet (40 mg total) by mouth daily. 30 tablet 0    ROS Review of Systems  Constitutional:  Positive for activity change, appetite change, fatigue and unexpected weight change. Negative for chills and diaphoresis.  HENT: Negative.    Eyes: Negative.   Respiratory:  Positive for shortness of breath. Negative for cough and wheezing.   Cardiovascular:  Negative for chest pain, palpitations and leg swelling.  Gastrointestinal:  Negative for abdominal pain, diarrhea, nausea and vomiting.  Endocrine: Negative.   Genitourinary: Negative.  Negative for difficulty urinating.  Musculoskeletal:  Positive for arthralgias. Negative for myalgias.  Skin: Negative.   Neurological:  Negative for dizziness, weakness, light-headedness and numbness.  Hematological:  Negative for adenopathy. Does not bruise/bleed easily.  Psychiatric/Behavioral: Negative.      Objective:  BP 134/70 (BP Location: Left Arm, Patient Position: Sitting, Cuff  Size: Normal)   Pulse (!) 40   Temp 98.1 F (36.7 C) (Oral)   Ht 5\' 9"  (1.753 m)   Wt 115 lb (52.2 kg)   SpO2 (!) 89%   BMI 16.98 kg/m   BP Readings from Last 3 Encounters:  05/15/22 (!) 153/66  05/13/22 134/70  05/03/22 105/67    Wt Readings from Last 3 Encounters:  05/15/22 110 lb 6.4 oz (50.1 kg)  05/13/22 115 lb (52.2 kg)  04/28/22 122 lb 8 oz (55.6 kg)    Physical  Exam Vitals reviewed.  Constitutional:      Appearance: He is cachectic. He is ill-appearing.  HENT:     Nose: Nose normal.     Mouth/Throat:     Mouth: Mucous membranes are moist.  Eyes:     General: No scleral icterus.    Conjunctiva/sclera: Conjunctivae normal.  Cardiovascular:     Rate and Rhythm: Normal rate and regular rhythm.     Pulses:          Dorsalis pedis pulses are 0 on the right side and 0 on the left side.       Posterior tibial pulses are 0 on the right side and 0 on the left side.     Heart sounds: Normal heart sounds, S1 normal and S2 normal. No murmur heard. Pulmonary:     Effort: Pulmonary effort is normal.     Breath sounds: Examination of the right-middle field reveals rhonchi. Examination of the left-middle field reveals rhonchi. Rhonchi present. No decreased breath sounds, wheezing or rales.  Abdominal:     General: Abdomen is flat.     Palpations: There is no mass.     Tenderness: There is no abdominal tenderness. There is no guarding.     Hernia: No hernia is present.  Musculoskeletal:     Cervical back: Neck supple.     Right lower leg: 1+ Pitting Edema present.     Left lower leg: 1+ Pitting Edema present.  Feet:     Right foot:     Skin integrity: Skin integrity normal.     Left foot:     Skin integrity: Skin integrity normal.  Lymphadenopathy:     Cervical: No cervical adenopathy.  Neurological:     General: No focal deficit present.     Mental Status: He is alert. Mental status is at baseline.  Psychiatric:        Attention and Perception: Perception normal. He is inattentive.        Mood and Affect: Mood is depressed. Affect is flat and angry.        Speech: He is communicative. Speech is delayed.        Behavior: Behavior is uncooperative, slowed, aggressive and withdrawn. Behavior is not agitated.        Thought Content: Thought content normal. Thought content is not paranoid or delusional. Thought content does not include homicidal or  suicidal ideation.        Cognition and Memory: Cognition is impaired. Memory is impaired.     Lab Results  Component Value Date   WBC 13.8 (H) 05/15/2022   HGB 12.5 (L) 05/15/2022   HCT 37.1 (L) 05/15/2022   PLT 348 05/15/2022   GLUCOSE 122 (H) 05/15/2022   CHOL 88 08/16/2021   TRIG 73.0 08/16/2021   HDL 36.50 (L) 08/16/2021   LDLDIRECT 77.7 04/12/2008   LDLCALC 36 08/16/2021   ALT 20 05/15/2022   AST 28 05/15/2022  NA 139 05/15/2022   K 2.9 (L) 05/15/2022   CL 98 05/15/2022   CREATININE 0.98 05/15/2022   BUN 20 05/15/2022   CO2 30 05/15/2022   TSH 0.878 04/28/2022   INR 1.3 (H) 04/27/2022   HGBA1C 7.6 (H) 08/16/2021    ECHOCARDIOGRAM COMPLETE  Result Date: 04/28/2022    ECHOCARDIOGRAM REPORT   Patient Name:   CLAUDEL BELKA Date of Exam: 04/28/2022 Medical Rec #:  [id number]     Height:       71.0 in Accession #:    [id number]    Weight:       122.5 lb Date of Birth:  December 12, 1940    BSA:          1.712 m Patient Age:    30 years      BP:           121/67 mmHg Patient Gender: M             HR:           55 bpm. Exam Location:  Inpatient Procedure: 2D Echo, Color Doppler and Cardiac Doppler Indications:    Stroke i63.9  History:        Patient has prior history of Echocardiogram examinations, most                 recent 01/23/2017. COPD; Risk Factors:Hypertension and                 Dyslipidemia.  Sonographer:    Raquel Sarna Senior RDCS Referring Phys: [id number] [place]  1. Left ventricular ejection fraction, by estimation, is 60 to 65%. The left ventricle has normal function. The left ventricle has no regional wall motion abnormalities. There is mild left ventricular hypertrophy. Left ventricular diastolic parameters are consistent with Grade II diastolic dysfunction (pseudonormalization).  2. Right ventricular systolic function is normal. The right ventricular size is normal.  3. The mitral valve is normal in structure. Mild mitral valve regurgitation. No evidence of mitral  stenosis. Moderate mitral annular calcification.  4. The aortic valve is tricuspid. Aortic valve regurgitation is not visualized. Aortic valve sclerosis is present, with no evidence of aortic valve stenosis.  5. The inferior vena cava is normal in size with greater than 50% respiratory variability, suggesting right atrial pressure of 3 mmHg. FINDINGS  Left Ventricle: Left ventricular ejection fraction, by estimation, is 60 to 65%. The left ventricle has normal function. The left ventricle has no regional wall motion abnormalities. The left ventricular internal cavity size was normal in size. There is  mild left ventricular hypertrophy. Left ventricular diastolic parameters are consistent with Grade II diastolic dysfunction (pseudonormalization). Right Ventricle: The right ventricular size is normal. Right ventricular systolic function is normal. Left Atrium: Left atrial size was normal in size. Right Atrium: Right atrial size was normal in size. Pericardium: There is no evidence of pericardial effusion. Mitral Valve: The mitral valve is normal in structure. Moderate mitral annular calcification. Mild mitral valve regurgitation. No evidence of mitral valve stenosis. Tricuspid Valve: The tricuspid valve is normal in structure. Tricuspid valve regurgitation is mild . No evidence of tricuspid stenosis. Aortic Valve: The aortic valve is tricuspid. Aortic valve regurgitation is not visualized. Aortic valve sclerosis is present, with no evidence of aortic valve stenosis. Pulmonic Valve: The pulmonic valve was normal in structure. Pulmonic valve regurgitation is trivial. No evidence of pulmonic stenosis. Aorta: The aortic root is normal in size and structure. Venous: The inferior vena cava  is normal in size with greater than 50% respiratory variability, suggesting right atrial pressure of 3 mmHg. IAS/Shunts: No atrial level shunt detected by color flow Doppler.  LEFT VENTRICLE PLAX 2D LVIDd:         4.55 cm   Diastology  LVIDs:         3.10 cm   LV e' medial:    7.18 cm/s LV PW:         1.15 cm   LV E/e' medial:  12.5 LV IVS:        1.05 cm   LV e' lateral:   7.40 cm/s LVOT diam:     1.95 cm   LV E/e' lateral: 12.2 LV SV:         80 LV SV Index:   47 LVOT Area:     2.99 cm  RIGHT VENTRICLE RV S prime:     12.50 cm/s TAPSE (M-mode): 2.0 cm LEFT ATRIUM             Index        RIGHT ATRIUM           Index LA diam:        3.80 cm 2.22 cm/m   RA Area:     14.40 cm LA Vol (A2C):   40.5 ml 23.66 ml/m  RA Volume:   34.00 ml  19.86 ml/m LA Vol (A4C):   34.5 ml 20.15 ml/m LA Biplane Vol: 40.7 ml 23.77 ml/m  AORTIC VALVE LVOT Vmax:   106.00 cm/s LVOT Vmean:  78.200 cm/s LVOT VTI:    0.267 m  AORTA Ao Root diam: 3.25 cm MITRAL VALVE MV Area (PHT): 3.58 cm    SHUNTS MV Decel Time: 212 msec    Systemic VTI:  0.27 m MV E velocity: 90.10 cm/s  Systemic Diam: 1.95 cm MV A velocity: 79.70 cm/s MV E/A ratio:  1.13 Olga Millers MD Electronically signed by Olga Millers MD Signature Date/Time: 04/28/2022/4:04:41 PM    Final    MR BRAIN WO CONTRAST  Result Date: 04/27/2022 CLINICAL DATA:  TIA. New onset of leg weakness. Question left parietal infarct. EXAM: MRI HEAD WITHOUT CONTRAST TECHNIQUE: Multiplanar, multiecho pulse sequences of the brain and surrounding structures were obtained without intravenous contrast. COMPARISON:  CT head without contrast and CTA head and neck 04/27/2022 FINDINGS: Brain: Atrophy and white matter changes are moderately advanced for age. The ventricles are proportionate to the degree of atrophy. No acute infarct, hemorrhage, or mass lesion is present. Remote lacunar infarct is present along the posterior limb of the left external capsule. Basal ganglia are intact. Insular ribbon is normal. The internal auditory canals are within normal limits. The brainstem and cerebellum are within normal limits. Vascular: Flow is present in the major intracranial arteries. Skull and upper cervical spine: The craniocervical  junction is normal. Upper cervical spine is within normal limits. Marrow signal is unremarkable. Sinuses/Orbits: Small mastoid effusions are present. No obstructing nasopharyngeal lesion is present. The paranasal sinuses and mastoid air cells are otherwise clear. The globes and orbits are within normal limits. Other: IMPRESSION: 1. No acute intracranial abnormality. 2. Atrophy and white matter disease is moderately advanced for age. This likely reflects the sequela of chronic microvascular ischemia. 3. Remote lacunar infarct of the posterior limb of the left external capsule. Electronically Signed   By: Marin Roberts M.D.   On: 04/27/2022 18:27   CT ANGIO HEAD NECK W WO CM W PERF (CODE STROKE)  Result Date:  04/27/2022 CLINICAL DATA:  Code stroke. Question left parietal infarction. EXAM: CT ANGIOGRAPHY HEAD AND NECK CT PERFUSION BRAIN TECHNIQUE: Multidetector CT imaging of the head and neck was performed using the standard protocol during bolus administration of intravenous contrast. Multiplanar CT image reconstructions and MIPs were obtained to evaluate the vascular anatomy. Carotid stenosis measurements (when applicable) are obtained utilizing NASCET criteria, using the distal internal carotid diameter as the denominator. Multiphase CT imaging of the brain was performed following IV bolus contrast injection. Subsequent parametric perfusion maps were calculated using RAPID software. RADIATION DOSE REDUCTION: This exam was performed according to the departmental dose-optimization program which includes automated exposure control, adjustment of the mA and/or kV according to patient size and/or use of iterative reconstruction technique. CONTRAST:  141mL OMNIPAQUE IOHEXOL 350 MG/ML SOLN COMPARISON:  CT head without contrast 04/27/2022. FINDINGS: CTA NECK FINDINGS Aortic arch: Atherosclerotic calcifications are present at the aortic arch and great vessel origins without focal aneurysm or stenosis. Right carotid  system: Atherosclerotic calcifications are present in the distal right common carotid artery. The right internal carotid artery is occluded. No reconstitution in the neck. Left carotid system: The left common carotid artery demonstrates some distal calcification. Dense calcifications are present at the bifurcation. The lumen is narrowed to 2.3 mm, less than 50% stenosis relative to the more normal distal left ICA. Vertebral arteries: The vertebral arteries are codominant. Both vertebral arteries originate from the subclavian arteries without significant stenosis. Moderate narrowing of up to 50% is present in the right vertebral artery as it enters the spinal canal at C6. No other significant focal stenosis is present in either vertebral artery in the neck. Skeleton: Degenerative changes are present in the cervical spine with grade 1 anterolisthesis at C3-4 and C4-5. Marked endplate changes are present at C5-6 and C6-7. No focal osseous lesions are present. The patient is edentulous. Other neck: Diffuse subcutaneous edema is present in the neck. No discrete lesion or abscess is present. No significant adenopathy is present. The submandibular and parotid glands and ducts are within normal limits. No focal mucosal lesions are present. The thyroid is normal. Upper chest: Centrilobular emphysematous changes are present. Scarring is present at both lung apices. Thoracic inlet is within normal limits. Review of the MIP images confirms the above findings CTA HEAD FINDINGS Anterior circulation: The right internal carotid artery is reconstituted at the level of the posterior communicating artery. Atherosclerotic calcifications are present within the cavernous left ICA without a significant stenosis relative to the ICA terminus. The left A1 segment is dominant. The anterior communicating artery is patent. The MCA scratched at the M1 segments are normal bilaterally. MCA bifurcations are within normal limits. The ACA and MCA  branch vessels are within normal limits. Posterior circulation: Moderate stenosis is present at the dural margin of the right vertebral artery. The intracranial left vertebral artery is the dominant vessel. PICA origins are visualized and normal. Vertebrobasilar junction is normal. Basilar artery is normal. Both posterior cerebral arteries originate from basilar tip. Moderate stenosis is present in the proximal left posterior cerebral artery. Pial collateral vessels contribute. Venous sinuses: The dural sinuses are patent. The straight sinus deep cerebral veins are intact. Cortical veins are within normal limits. No significant vascular malformation is evident. Anatomic variants: Prominent right posterior communicating artery reconstituting the right ICA. Review of the MIP images confirms the above findings CT Brain Perfusion Findings: ASPECTS: 10/10 CBF (<30%) Volume: 65mL Perfusion (Tmax>6.0s) volume: 72mL Mismatch Volume: 29mL Infarction Location:N/A IMPRESSION: 1. CT perfusion  demonstrates no acute infarct or significant area of ischemia. 2. 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 3. Less than 50% stenosis of the left internal carotid artery at the bifurcation. 4. Moderate stenosis of up to 70% in the right vertebral artery as it enters the spinal canal at C6. 5. Moderate stenosis of the proximal left posterior cerebral artery with pial collaterals filling the territory. 6. Aortic Atherosclerosis (ICD10-I70.0) and Emphysema (ICD10-J43.9). Electronically Signed   By: Marin Roberts M.D.   On: 04/27/2022 12:36   CT HEAD CODE STROKE WO CONTRAST`  Result Date: 04/27/2022 CLINICAL DATA:  Code stroke.  New onset of leg weakness. Fell. EXAM: CT HEAD WITHOUT CONTRAST TECHNIQUE: Contiguous axial images were obtained from the base of the skull through the vertex without intravenous contrast. RADIATION DOSE  REDUCTION: This exam was performed according to the departmental dose-optimization program which includes automated exposure control, adjustment of the mA and/or kV according to patient size and/or use of iterative reconstruction technique. COMPARISON:  None Available. FINDINGS: Brain: No acute infarct, hemorrhage, or mass lesion is present. Moderate atrophy and white matter changes are present bilaterally. Basal ganglia are intact. Insular ribbon is normal. No acute or focal cortical abnormality is present. The ventricles are proportionate to the degree of atrophy. No significant extraaxial fluid collection is present. The brainstem and cerebellum are within normal limits. Vascular: Atherosclerotic calcifications are present within the cavernous internal carotid arteries. Hyperdense vessel is present. Skull: Calvarium is intact. No focal lytic or blastic lesions are present. No significant extracranial soft tissue lesion is present. Sinuses/Orbits: Shrunken maxillary sinuses are present bilaterally. No acute sinus disease is present. The globes and orbits are within normal limits. ASPECTS [facility] Stroke Program Early CT Score) - Ganglionic level infarction (caudate, lentiform nuclei, internal capsule, insula, M1-M3 cortex): 7/7 - Supraganglionic infarction (M4-M6 cortex): 3/3 Total score (0-10 with 10 being normal): 10/10 IMPRESSION: 1. No acute intracranial abnormality. 2. Moderate atrophy and white matter disease likely reflects the sequela of chronic microvascular ischemia. 3. ASPECTS is 10/10. * Electronically Signed   By: Marin Roberts M.D.   On: 04/27/2022 12:03    Assessment & Plan:   Garron was seen today for anemia and copd.  Diagnoses and all orders for this visit:  Anemia due to acquired thiamine deficiency -     thiamine (VITAMIN B-1) 50 MG tablet; Take 1 tablet (50 mg total) by mouth daily.  Chronic respiratory failure with hypercapnia (HCC) -     Ambulatory referral to  Hospice  Moderate persistent asthma without complication  Protein-calorie malnutrition, severe (HCC) -     mirtazapine (REMERON) 7.5 MG tablet; Take 1 tablet (7.5 mg total) by mouth at bedtime.  Type 2 diabetes mellitus with other specified complication, without long-term current use of insulin (HCC)-he is no longer diabetic. -     POCT glycosylated hemoglobin (Hb A1C) -     HM Diabetes Foot Exam  Current severe episode of major depressive disorder without psychotic features without prior episode (HCC) -     mirtazapine (REMERON) 7.5 MG tablet; Take 1 tablet (7.5 mg total) by mouth at bedtime.  Primary osteoarthritis involving multiple joints- I have asked him to stop taking Goody powder.  We will control the pain with fentanyl. -     fentaNYL (DURAGESIC) 12 MCG/HR; Place 1 patch onto the skin every 3 (three) days.  Encounter for palliative care involving management  of pain -     fentaNYL (DURAGESIC) 12 MCG/HR; Place 1 patch onto the skin every 3 (three) days.  Cerebrovascular disease -     Ambulatory referral to Hospice   I am having Salley Scarlet start on thiamine, mirtazapine, and fentaNYL. I am also having him maintain his albuterol, albuterol, aspirin EC, ezetimibe, hydrochlorothiazide, and rosuvastatin.  Meds ordered this encounter  Medications   thiamine (VITAMIN B-1) 50 MG tablet    Sig: Take 1 tablet (50 mg total) by mouth daily.    Dispense:  90 tablet    Refill:  1   mirtazapine (REMERON) 7.5 MG tablet    Sig: Take 1 tablet (7.5 mg total) by mouth at bedtime.    Dispense:  90 tablet    Refill:  0   fentaNYL (DURAGESIC) 12 MCG/HR    Sig: Place 1 patch onto the skin every 3 (three) days.    Dispense:  10 patch    Refill:  0     Follow-up: Return in about 6 months (around 11/13/2022).  Scarlette Calico, MD

## 2022-05-14 ENCOUNTER — Telehealth: Payer: Self-pay | Admitting: *Deleted

## 2022-05-14 ENCOUNTER — Telehealth: Payer: Self-pay | Admitting: Internal Medicine

## 2022-05-14 DIAGNOSIS — I2510 Atherosclerotic heart disease of native coronary artery without angina pectoris: Secondary | ICD-10-CM | POA: Diagnosis not present

## 2022-05-14 DIAGNOSIS — G9340 Encephalopathy, unspecified: Secondary | ICD-10-CM | POA: Diagnosis not present

## 2022-05-14 DIAGNOSIS — J9611 Chronic respiratory failure with hypoxia: Secondary | ICD-10-CM | POA: Diagnosis not present

## 2022-05-14 DIAGNOSIS — I442 Atrioventricular block, complete: Secondary | ICD-10-CM | POA: Diagnosis not present

## 2022-05-14 DIAGNOSIS — Z48812 Encounter for surgical aftercare following surgery on the circulatory system: Secondary | ICD-10-CM | POA: Diagnosis not present

## 2022-05-14 DIAGNOSIS — J441 Chronic obstructive pulmonary disease with (acute) exacerbation: Secondary | ICD-10-CM | POA: Diagnosis not present

## 2022-05-14 NOTE — Telephone Encounter (Signed)
Called pharmacy to see what plan or number to call since not able to clarify insurance. Called [phone] spoke Joni Reining gave [place] information on pt. Joni Reining states will send to clinical dept to get determination. Key # [id number] [email or web address]

## 2022-05-14 NOTE — Telephone Encounter (Signed)
[name] called and stated the pharmacy advised that [facility] needs a prior authorization for fentaNYL (DURAGESIC) 12 MCG/HR.    Please advise

## 2022-05-14 NOTE — Telephone Encounter (Signed)
  Chronic Care Management   Note  05/14/2022 Name: [name] MRN: [id number] DOB: 04-18-1941    Follow up plan: No further follow up required: family will contact PCP office or CSW if further needs arise.  At this time, family knows the options for care are to hire private duty caregivers (pt has a Long Term [employer] and may choose to file a claim (has a 90 day waiting period) or placement (likely ALF at ALF where his wife resides). Pt's daughter reports HH has started in the home and PT will be back on Thursday. Pt was seen by PCP yesterday and some RX changes were made- per daughter, pt having some possible side effects; incontinent, up during the night, etc- suggested they follow up with PCP if this continues.   CSW will sign off.    Reece Levy MSW, LCSW Licensed Clinical Social Worker CCM Coverage      [phone]

## 2022-05-15 ENCOUNTER — Telehealth: Payer: Self-pay

## 2022-05-15 ENCOUNTER — Telehealth: Payer: Self-pay | Admitting: Internal Medicine

## 2022-05-15 ENCOUNTER — Emergency Department (HOSPITAL_COMMUNITY): Payer: Medicare Other

## 2022-05-15 ENCOUNTER — Ambulatory Visit: Payer: Medicare Other

## 2022-05-15 ENCOUNTER — Encounter (HOSPITAL_COMMUNITY): Payer: Self-pay | Admitting: Emergency Medicine

## 2022-05-15 ENCOUNTER — Other Ambulatory Visit: Payer: Self-pay

## 2022-05-15 ENCOUNTER — Inpatient Hospital Stay (HOSPITAL_COMMUNITY)
Admission: EM | Admit: 2022-05-15 | Discharge: 2022-05-20 | DRG: 312 | Disposition: A | Discharge status: Skilled Nursing Facility | Payer: Medicare Other | Attending: Internal Medicine | Admitting: Internal Medicine

## 2022-05-15 DIAGNOSIS — Z789 Other specified health status: Secondary | ICD-10-CM | POA: Diagnosis not present

## 2022-05-15 DIAGNOSIS — E781 Pure hyperglyceridemia: Secondary | ICD-10-CM | POA: Diagnosis present

## 2022-05-15 DIAGNOSIS — R531 Weakness: Secondary | ICD-10-CM | POA: Diagnosis not present

## 2022-05-15 DIAGNOSIS — Z9981 Dependence on supplemental oxygen: Secondary | ICD-10-CM

## 2022-05-15 DIAGNOSIS — M4319 Spondylolisthesis, multiple sites in spine: Secondary | ICD-10-CM | POA: Diagnosis not present

## 2022-05-15 DIAGNOSIS — L89152 Pressure ulcer of sacral region, stage 2: Secondary | ICD-10-CM | POA: Diagnosis present

## 2022-05-15 DIAGNOSIS — L89611 Pressure ulcer of right heel, stage 1: Secondary | ICD-10-CM | POA: Diagnosis present

## 2022-05-15 DIAGNOSIS — N200 Calculus of kidney: Secondary | ICD-10-CM | POA: Diagnosis present

## 2022-05-15 DIAGNOSIS — J439 Emphysema, unspecified: Secondary | ICD-10-CM | POA: Diagnosis not present

## 2022-05-15 DIAGNOSIS — Z955 Presence of coronary angioplasty implant and graft: Secondary | ICD-10-CM

## 2022-05-15 DIAGNOSIS — Z66 Do not resuscitate: Secondary | ICD-10-CM | POA: Diagnosis present

## 2022-05-15 DIAGNOSIS — E119 Type 2 diabetes mellitus without complications: Secondary | ICD-10-CM | POA: Diagnosis not present

## 2022-05-15 DIAGNOSIS — D638 Anemia in other chronic diseases classified elsewhere: Secondary | ICD-10-CM | POA: Diagnosis present

## 2022-05-15 DIAGNOSIS — E43 Unspecified severe protein-calorie malnutrition: Secondary | ICD-10-CM | POA: Diagnosis present

## 2022-05-15 DIAGNOSIS — G8929 Other chronic pain: Secondary | ICD-10-CM | POA: Diagnosis present

## 2022-05-15 DIAGNOSIS — N281 Cyst of kidney, acquired: Secondary | ICD-10-CM | POA: Diagnosis not present

## 2022-05-15 DIAGNOSIS — I21A1 Myocardial infarction type 2: Secondary | ICD-10-CM | POA: Diagnosis not present

## 2022-05-15 DIAGNOSIS — E860 Dehydration: Secondary | ICD-10-CM | POA: Diagnosis not present

## 2022-05-15 DIAGNOSIS — Z7982 Long term (current) use of aspirin: Secondary | ICD-10-CM

## 2022-05-15 DIAGNOSIS — R32 Unspecified urinary incontinence: Secondary | ICD-10-CM | POA: Diagnosis present

## 2022-05-15 DIAGNOSIS — R0689 Other abnormalities of breathing: Secondary | ICD-10-CM | POA: Diagnosis not present

## 2022-05-15 DIAGNOSIS — Z8249 Family history of ischemic heart disease and other diseases of the circulatory system: Secondary | ICD-10-CM

## 2022-05-15 DIAGNOSIS — Z20822 Contact with and (suspected) exposure to covid-19: Secondary | ICD-10-CM | POA: Diagnosis present

## 2022-05-15 DIAGNOSIS — Z720 Tobacco use: Secondary | ICD-10-CM | POA: Diagnosis present

## 2022-05-15 DIAGNOSIS — Z880 Allergy status to penicillin: Secondary | ICD-10-CM

## 2022-05-15 DIAGNOSIS — I252 Old myocardial infarction: Secondary | ICD-10-CM

## 2022-05-15 DIAGNOSIS — I442 Atrioventricular block, complete: Secondary | ICD-10-CM | POA: Diagnosis not present

## 2022-05-15 DIAGNOSIS — Z79899 Other long term (current) drug therapy: Secondary | ICD-10-CM

## 2022-05-15 DIAGNOSIS — F17210 Nicotine dependence, cigarettes, uncomplicated: Secondary | ICD-10-CM | POA: Diagnosis not present

## 2022-05-15 DIAGNOSIS — Z87891 Personal history of nicotine dependence: Secondary | ICD-10-CM

## 2022-05-15 DIAGNOSIS — J441 Chronic obstructive pulmonary disease with (acute) exacerbation: Secondary | ICD-10-CM | POA: Diagnosis not present

## 2022-05-15 DIAGNOSIS — L89322 Pressure ulcer of left buttock, stage 2: Secondary | ICD-10-CM | POA: Diagnosis not present

## 2022-05-15 DIAGNOSIS — Z9181 History of falling: Secondary | ICD-10-CM

## 2022-05-15 DIAGNOSIS — R319 Hematuria, unspecified: Secondary | ICD-10-CM | POA: Diagnosis present

## 2022-05-15 DIAGNOSIS — L22 Diaper dermatitis: Secondary | ICD-10-CM | POA: Diagnosis present

## 2022-05-15 DIAGNOSIS — S199XXA Unspecified injury of neck, initial encounter: Secondary | ICD-10-CM | POA: Diagnosis not present

## 2022-05-15 DIAGNOSIS — I455 Other specified heart block: Secondary | ICD-10-CM | POA: Diagnosis not present

## 2022-05-15 DIAGNOSIS — F32A Depression, unspecified: Secondary | ICD-10-CM | POA: Diagnosis not present

## 2022-05-15 DIAGNOSIS — S0990XA Unspecified injury of head, initial encounter: Secondary | ICD-10-CM | POA: Diagnosis not present

## 2022-05-15 DIAGNOSIS — Z7401 Bed confinement status: Secondary | ICD-10-CM | POA: Diagnosis not present

## 2022-05-15 DIAGNOSIS — Z48812 Encounter for surgical aftercare following surgery on the circulatory system: Secondary | ICD-10-CM | POA: Diagnosis not present

## 2022-05-15 DIAGNOSIS — R296 Repeated falls: Secondary | ICD-10-CM

## 2022-05-15 DIAGNOSIS — Z681 Body mass index (BMI) 19 or less, adult: Secondary | ICD-10-CM | POA: Diagnosis not present

## 2022-05-15 DIAGNOSIS — Z950 Presence of cardiac pacemaker: Secondary | ICD-10-CM

## 2022-05-15 DIAGNOSIS — R634 Abnormal weight loss: Secondary | ICD-10-CM | POA: Diagnosis present

## 2022-05-15 DIAGNOSIS — R627 Adult failure to thrive: Secondary | ICD-10-CM | POA: Diagnosis present

## 2022-05-15 DIAGNOSIS — G9340 Encephalopathy, unspecified: Secondary | ICD-10-CM | POA: Diagnosis not present

## 2022-05-15 DIAGNOSIS — J9611 Chronic respiratory failure with hypoxia: Secondary | ICD-10-CM | POA: Diagnosis not present

## 2022-05-15 DIAGNOSIS — R0902 Hypoxemia: Secondary | ICD-10-CM | POA: Diagnosis not present

## 2022-05-15 DIAGNOSIS — K5730 Diverticulosis of large intestine without perforation or abscess without bleeding: Secondary | ICD-10-CM | POA: Diagnosis not present

## 2022-05-15 DIAGNOSIS — R8289 Other abnormal findings on cytological and histological examination of urine: Secondary | ICD-10-CM | POA: Diagnosis not present

## 2022-05-15 DIAGNOSIS — J449 Chronic obstructive pulmonary disease, unspecified: Secondary | ICD-10-CM | POA: Diagnosis present

## 2022-05-15 DIAGNOSIS — I679 Cerebrovascular disease, unspecified: Secondary | ICD-10-CM | POA: Diagnosis not present

## 2022-05-15 DIAGNOSIS — I959 Hypotension, unspecified: Secondary | ICD-10-CM | POA: Diagnosis not present

## 2022-05-15 DIAGNOSIS — F322 Major depressive disorder, single episode, severe without psychotic features: Secondary | ICD-10-CM | POA: Diagnosis not present

## 2022-05-15 DIAGNOSIS — R001 Bradycardia, unspecified: Secondary | ICD-10-CM | POA: Diagnosis not present

## 2022-05-15 DIAGNOSIS — K8689 Other specified diseases of pancreas: Secondary | ICD-10-CM | POA: Diagnosis not present

## 2022-05-15 DIAGNOSIS — R55 Syncope and collapse: Principal | ICD-10-CM | POA: Diagnosis present

## 2022-05-15 DIAGNOSIS — Z95828 Presence of other vascular implants and grafts: Secondary | ICD-10-CM | POA: Diagnosis not present

## 2022-05-15 DIAGNOSIS — E876 Hypokalemia: Secondary | ICD-10-CM | POA: Diagnosis not present

## 2022-05-15 DIAGNOSIS — I10 Essential (primary) hypertension: Secondary | ICD-10-CM | POA: Diagnosis not present

## 2022-05-15 DIAGNOSIS — R5383 Other fatigue: Secondary | ICD-10-CM | POA: Diagnosis not present

## 2022-05-15 DIAGNOSIS — J9610 Chronic respiratory failure, unspecified whether with hypoxia or hypercapnia: Secondary | ICD-10-CM | POA: Diagnosis present

## 2022-05-15 DIAGNOSIS — Z79891 Long term (current) use of opiate analgesic: Secondary | ICD-10-CM

## 2022-05-15 DIAGNOSIS — I6529 Occlusion and stenosis of unspecified carotid artery: Secondary | ICD-10-CM | POA: Diagnosis not present

## 2022-05-15 DIAGNOSIS — I2510 Atherosclerotic heart disease of native coronary artery without angina pectoris: Secondary | ICD-10-CM | POA: Diagnosis not present

## 2022-05-15 DIAGNOSIS — D649 Anemia, unspecified: Secondary | ICD-10-CM | POA: Diagnosis not present

## 2022-05-15 DIAGNOSIS — R262 Difficulty in walking, not elsewhere classified: Secondary | ICD-10-CM | POA: Diagnosis present

## 2022-05-15 DIAGNOSIS — I6523 Occlusion and stenosis of bilateral carotid arteries: Secondary | ICD-10-CM | POA: Diagnosis not present

## 2022-05-15 DIAGNOSIS — R2243 Localized swelling, mass and lump, lower limb, bilateral: Secondary | ICD-10-CM | POA: Diagnosis not present

## 2022-05-15 DIAGNOSIS — R569 Unspecified convulsions: Secondary | ICD-10-CM | POA: Diagnosis not present

## 2022-05-15 DIAGNOSIS — G3184 Mild cognitive impairment, so stated: Secondary | ICD-10-CM | POA: Diagnosis present

## 2022-05-15 DIAGNOSIS — W19XXXA Unspecified fall, initial encounter: Secondary | ICD-10-CM | POA: Diagnosis present

## 2022-05-15 DIAGNOSIS — R011 Cardiac murmur, unspecified: Secondary | ICD-10-CM | POA: Diagnosis not present

## 2022-05-15 DIAGNOSIS — G459 Transient cerebral ischemic attack, unspecified: Secondary | ICD-10-CM | POA: Diagnosis not present

## 2022-05-15 DIAGNOSIS — E519 Thiamine deficiency, unspecified: Secondary | ICD-10-CM | POA: Diagnosis not present

## 2022-05-15 DIAGNOSIS — E785 Hyperlipidemia, unspecified: Secondary | ICD-10-CM | POA: Diagnosis present

## 2022-05-15 DIAGNOSIS — I700 Atherosclerosis of aorta: Secondary | ICD-10-CM | POA: Diagnosis not present

## 2022-05-15 LAB — COMPREHENSIVE METABOLIC PANEL
ALT: 20 U/L (ref 0–44)
AST: 28 U/L (ref 15–41)
Albumin: 2.8 g/dL — ABNORMAL LOW (ref 3.5–5.0)
Alkaline Phosphatase: 72 U/L (ref 38–126)
Anion gap: 11 (ref 5–15)
BUN: 20 mg/dL (ref 8–23)
CO2: 30 mmol/L (ref 22–32)
Calcium: 9.3 mg/dL (ref 8.9–10.3)
Chloride: 98 mmol/L (ref 98–111)
Creatinine, Ser: 0.98 mg/dL (ref 0.61–1.24)
GFR, Estimated: >60 mL/min (ref >60)
Glucose, Bld: 122 mg/dL — ABNORMAL HIGH (ref 70–99)
Potassium: 2.9 mmol/L — ABNORMAL LOW (ref 3.5–5.1)
Sodium: 139 mmol/L (ref 135–145)
Total Bilirubin: 0.6 mg/dL (ref 0.3–1.2)
Total Protein: 6.1 g/dL — ABNORMAL LOW (ref 6.5–8.1)

## 2022-05-15 LAB — CBC WITH DIFFERENTIAL/PLATELET
Abs Immature Granulocytes: 0.06 10*3/uL (ref 0.00–0.07)
Basophils Absolute: 0.1 10*3/uL (ref 0.0–0.1)
Basophils Relative: 1 %
Eosinophils Absolute: 0.1 10*3/uL (ref 0.0–0.5)
Eosinophils Relative: 1 %
HCT: 37.1 % — ABNORMAL LOW (ref 39.0–52.0)
Hemoglobin: 12.5 g/dL — ABNORMAL LOW (ref 13.0–17.0)
Immature Granulocytes: 0 %
Lymphocytes Relative: 13 %
Lymphs Abs: 1.8 10*3/uL (ref 0.7–4.0)
MCH: 32.1 pg (ref 26.0–34.0)
MCHC: 33.7 g/dL (ref 30.0–36.0)
MCV: 95.4 fL (ref 80.0–100.0)
Monocytes Absolute: 1 10*3/uL (ref 0.1–1.0)
Monocytes Relative: 7 %
Neutro Abs: 10.7 10*3/uL — ABNORMAL HIGH (ref 1.7–7.7)
Neutrophils Relative %: 78 %
Platelets: 348 10*3/uL (ref 150–400)
RBC: 3.89 MIL/uL — ABNORMAL LOW (ref 4.22–5.81)
RDW: 14.8 % (ref 11.5–15.5)
WBC: 13.8 10*3/uL — ABNORMAL HIGH (ref 4.0–10.5)
nRBC: 0 % (ref 0.0–0.2)

## 2022-05-15 LAB — URINALYSIS, ROUTINE W REFLEX MICROSCOPIC
Bacteria, UA: NONE SEEN
Bilirubin Urine: NEGATIVE
Glucose, UA: NEGATIVE mg/dL
Ketones, ur: NEGATIVE mg/dL
Leukocytes,Ua: NEGATIVE
Nitrite: NEGATIVE
Protein, ur: 30 mg/dL — AB
Specific Gravity, Urine: 1.011 (ref 1.005–1.030)
pH: 7 (ref 5.0–8.0)

## 2022-05-15 LAB — RESP PANEL BY RT-PCR (FLU A&B, COVID) ARPGX2
Influenza A by PCR: NEGATIVE
Influenza B by PCR: NEGATIVE
SARS Coronavirus 2 by RT PCR: NEGATIVE

## 2022-05-15 LAB — MAGNESIUM: Magnesium: 1.8 mg/dL (ref 1.7–2.4)

## 2022-05-15 LAB — CBG MONITORING, ED: Glucose-Capillary: 127 mg/dL — ABNORMAL HIGH (ref 70–99)

## 2022-05-15 LAB — TROPONIN I (HIGH SENSITIVITY)
Troponin I (High Sensitivity): 17 ng/L (ref <18)
Troponin I (High Sensitivity): 15 ng/L (ref <18)

## 2022-05-15 MED ORDER — ONDANSETRON HCL 4 MG/2ML IJ SOLN: 4.0000 mg | Freq: Four times a day (QID) | INTRAMUSCULAR | Status: DC | PRN

## 2022-05-15 MED ORDER — ACETAMINOPHEN 650 MG RE SUPP: 650.0000 mg | Freq: Four times a day (QID) | RECTAL | Status: DC | PRN

## 2022-05-15 MED ORDER — NICOTINE 14 MG/24HR TD PT24
14.0000 mg | MEDICATED_PATCH | Freq: Every day | TRANSDERMAL | Status: DC
  Administered 2022-05-15 – 2022-05-20 (×6): 14 mg via TRANSDERMAL
  Filled 2022-05-15 (×6): qty 1

## 2022-05-15 MED ORDER — ROSUVASTATIN CALCIUM 20 MG PO TABS
40.0000 mg | ORAL_TABLET | Freq: Every day | ORAL | Status: DC
  Administered 2022-05-16 – 2022-05-20 (×5): 40 mg via ORAL
  Filled 2022-05-15 (×5): qty 2

## 2022-05-15 MED ORDER — ONDANSETRON HCL 4 MG PO TABS: 4.0000 mg | ORAL_TABLET | Freq: Four times a day (QID) | ORAL | Status: DC | PRN

## 2022-05-15 MED ORDER — SODIUM CHLORIDE 0.9 % IV BOLUS
500.0000 mL | Freq: Once | INTRAVENOUS | Status: AC
  Administered 2022-05-15: 500 mL via INTRAVENOUS

## 2022-05-15 MED ORDER — ALBUTEROL SULFATE (2.5 MG/3ML) 0.083% IN NEBU: 2.5000 mg | INHALATION_SOLUTION | Freq: Four times a day (QID) | RESPIRATORY_TRACT | Status: DC | PRN

## 2022-05-15 MED ORDER — HYDROCHLOROTHIAZIDE 12.5 MG PO TABS
12.5000 mg | ORAL_TABLET | Freq: Every day | ORAL | Status: DC
  Administered 2022-05-16: 12.5 mg via ORAL
  Filled 2022-05-15 (×2): qty 1

## 2022-05-15 MED ORDER — ASPIRIN 81 MG PO TBEC
81.0000 mg | DELAYED_RELEASE_TABLET | Freq: Every day | ORAL | Status: DC
  Administered 2022-05-16: 81 mg via ORAL
  Filled 2022-05-15: qty 1

## 2022-05-15 MED ORDER — ENOXAPARIN SODIUM 40 MG/0.4ML IJ SOSY
40.0000 mg | PREFILLED_SYRINGE | INTRAMUSCULAR | Status: DC
Start: 2022-05-15 — End: 2022-05-16
  Administered 2022-05-15: 40 mg via SUBCUTANEOUS
  Filled 2022-05-15: qty 0.4

## 2022-05-15 MED ORDER — FENTANYL 12 MCG/HR TD PT72
1.0000 | MEDICATED_PATCH | TRANSDERMAL | Status: DC
Start: 2022-05-15 — End: 2022-05-15

## 2022-05-15 MED ORDER — ENSURE ENLIVE PO LIQD
237.0000 mL | Freq: Two times a day (BID) | ORAL | Status: DC
  Administered 2022-05-16 (×2): 237 mL via ORAL

## 2022-05-15 MED ORDER — OXYCODONE HCL 5 MG PO TABS: 5.0000 mg | ORAL_TABLET | ORAL | Status: DC | PRN

## 2022-05-15 MED ORDER — POTASSIUM CHLORIDE CRYS ER 20 MEQ PO TBCR
40.0000 meq | EXTENDED_RELEASE_TABLET | Freq: Once | ORAL | Status: AC
  Administered 2022-05-15: 40 meq via ORAL
  Filled 2022-05-15: qty 2

## 2022-05-15 MED ORDER — MIRTAZAPINE 15 MG PO TABS
7.5000 mg | ORAL_TABLET | Freq: Every day | ORAL | Status: DC
  Administered 2022-05-15 – 2022-05-19 (×5): 7.5 mg via ORAL
  Filled 2022-05-15 (×6): qty 0.5

## 2022-05-15 MED ORDER — EZETIMIBE 10 MG PO TABS
10.0000 mg | ORAL_TABLET | Freq: Every day | ORAL | Status: DC
  Administered 2022-05-16 – 2022-05-20 (×5): 10 mg via ORAL
  Filled 2022-05-15 (×5): qty 1

## 2022-05-15 MED ORDER — ACETAMINOPHEN 325 MG PO TABS
650.0000 mg | ORAL_TABLET | Freq: Four times a day (QID) | ORAL | Status: DC | PRN
  Administered 2022-05-19: 650 mg via ORAL
  Filled 2022-05-15: qty 2

## 2022-05-15 MED ORDER — SODIUM CHLORIDE 0.9% FLUSH
3.0000 mL | Freq: Two times a day (BID) | INTRAVENOUS | Status: DC
Start: 2022-05-15 — End: 2022-05-20
  Administered 2022-05-15 – 2022-05-19 (×9): 3 mL via INTRAVENOUS

## 2022-05-15 NOTE — ED Notes (Signed)
Transported to CT 

## 2022-05-15 NOTE — Plan of Care (Signed)
  Problem: Education: Goal: Knowledge of General Education information will improve Description: Including pain rating scale, medication(s)/side effects and non-pharmacologic comfort measures Outcome: Progressing   Problem: Clinical Measurements: Goal: Ability to maintain clinical measurements within normal limits will improve Outcome: Progressing Goal: Will remain free from infection Outcome: Progressing Goal: Diagnostic test results will improve Outcome: Progressing Goal: Respiratory complications will improve Outcome: Progressing Goal: Cardiovascular complication will be avoided Outcome: Progressing   Problem: Nutrition: Goal: Adequate nutrition will be maintained Outcome: Progressing   Problem: Coping: Goal: Level of anxiety will decrease Outcome: Progressing   Problem: Elimination: Goal: Will not experience complications related to urinary retention Outcome: Progressing   Problem: Safety: Goal: Ability to remain free from injury will improve Outcome: Progressing   Problem: Skin Integrity: Goal: Risk for impaired skin integrity will decrease Outcome: Progressing

## 2022-05-15 NOTE — ED Triage Notes (Signed)
BIB [facility] after pt family called to report near syncopal episode. EMS reports, that when fire arrived pt HR in 40's but upon EMS arrival HR in mid 80's. Pacemaker placed ten days ago.

## 2022-05-15 NOTE — Telephone Encounter (Signed)
Hospital called to find out what kind of pacemaker the patient has? I let them know that the patient has a [employer] pacemaker. I put the patient in paceart and on a 91 day remote schedule.

## 2022-05-15 NOTE — Progress Notes (Signed)
Pt is shaky and unstable with standing. We were unable to obtain accurate orthostatic BP. BP did not drop, and pt denied dizziness or lightheadedness.  Hinton Dyer, RN

## 2022-05-15 NOTE — H&P (Signed)
History and Physical    Patient: [name] HEN:[id number] DOB: [birth year] DOA: 05/15/2022 DOS: the patient was seen and examined on 05/15/2022 PCP: Etta Grandchild, MD  Patient coming from: Home - lives with son; NOK: Daughter, [name], [phone]   Chief Complaint: syncope  HPI: [name] is a 81 y.o. male with medical history significant of CAD; HTN; HLD; and recent pacemaker placement presenting with near syncope.   He was last admitted from 7/1-7 for CHB and had pacemaker placement.  He was recommended for [facility] PT/OT.  The family called the PCP the day of dc to ask for referral back to rehab.  He has had 2 prior hospitalizations with SNF stays and the family desired SNF rehab again.  He saw his PCP on 7/17 for hospitalization f/u and was referred to hospice.  Clinic SW spoke with the family yesterday and was given the option to hire private duty caregivers or to be placed.  Daughter called the PCP today to report that "the ED will be keeping him over night to get him into rehab/24hr care."  His son and daughter were present and provided most of the history.  The patient appeared to have some confusion.  They report that they wanted him placed with his last admission but he refused.   Instead, PT recommended home services and he went home with his son.  He did well and seemed to have slow improvement for the first few days, but still required significant assistance with mobility and ADLs.  However, he began to decline again the last few days.  He fell yesterday in the middle of the night and was found behind his recliner, covered in stool.  He fell again this AM and appeared to pass out on the commode.  Family was concerned that it might be related to his pacemaker.  They would like for him to be placed, preferably in CIR, and he is currently willing to be placed.      ER Course:  DC on 7/7 for complete heart block, pacer.  Recurrent falls, syncope this AM.  Pacer interrogated and  ok.  Work-up reassuring, has chronic hematuria, Hgb stable, WBC 13.9 without apparent infection.  Imaging unremarkable.  Family reports need for placement.  Family refusing to take him home.     Review of Systems: As mentioned in the history of present illness. All other systems reviewed and are negative.  Limited by ?cognitive impairment  Past Medical History:  Diagnosis Date   Asthma    as a child   Carotid arterial disease (HCC)    a. 11/2015 Carotid U/S: LICA 40-59%, RICA 100 CTO, nl subclavian arteries--f/u 1 yr.   Coronary atherosclerosis of native coronary artery    a. 09/2001 inflat MI/PCI: RCA 70m (3.0x18 A[address] S7 BMS); b. 07/2006 Cath: LM nl, LAD 50p, LCX min irregs, OM1 large, min irregs, OM2 min irregs, RCA 20 ISR;  c. 10/2012 MV: EF 74%, no ischemia.   Essential hypertension    Heart murmur    since a child   Hyperlipidemia    Myocardial infarction [facility]) 2002   Pneumonia    age 22   Right inguinal hernia    Past Surgical History:  Procedure Laterality Date   CARDIAC CATHETERIZATION     Carotid stents     COLONOSCOPY     FRACTURE SURGERY     HERNIA REPAIR     ventral   INGUINAL HERNIA REPAIR Right 11/01/2016  Procedure: RIGHT INGUINAL HERNIA REPAIR WITH MESH;  Surgeon: Avel Peace, MD;  Location: [facility] OR;  Service: General;  Laterality: Right;   INSERTION OF MESH Right 11/01/2016   Procedure: INSERTION OF MESH;  Surgeon: Avel Peace, MD;  Location: [facility] OR;  Service: General;  Laterality: Right;   ORIF SHOULDER FRACTURE Right    PACEMAKER IMPLANT N/A 05/02/2022   Procedure: PACEMAKER IMPLANT;  Surgeon: Lanier Prude, MD;  Location: [facility] INVASIVE CV LAB;  Service: Cardiovascular;  Laterality: N/A;   Social History:  reports that he quit smoking about 23 years ago. His smoking use included cigarettes. He has a 44.00 pack-year smoking history. He has quit using smokeless tobacco.  His smokeless tobacco use included chew. He reports that he does not drink alcohol and  does not use drugs.  Allergies  Allergen Reactions   Penicillins Hives and Swelling    Has patient had a PCN reaction causing immediate rash, facial/tongue/throat swelling, SOB or lightheadedness with hypotension: Yes Has patient had a PCN reaction causing severe rash involving mucus membranes or skin necrosis: Yes Has patient had a PCN reaction that required hospitalization No Has patient had a PCN reaction occurring within the last 10 years: No If all of the above answers are "NO", then may proceed with Cephalosporin use.     Family History  Problem Relation Age of Onset   Hypertension Other     Prior to Admission medications   Medication Sig Start Date End Date Taking? Authorizing Provider  albuterol (PROVENTIL) (2.5 MG/3ML) 0.083% nebulizer solution INHALE THE CONTENTS OF 1 VIAL INTO THE LUNGS VIA NEBULIZER EVERY 6 HOURS AS NEEDED FOR WHEEZING OR SHORTNESS OF BREATH Patient taking differently: Take 2.5 mg by nebulization every 6 (six) hours as needed for wheezing or shortness of breath. INHALE THE CONTENTS OF 1 VIAL INTO THE LUNGS VIA NEBULIZER EVERY 6 HOURS AS NEEDED FOR WHEEZING OR SHORTNESS OF BREATH 02/11/22   Etta Grandchild, MD  albuterol (VENTOLIN HFA) 108 (90 Base) MCG/ACT inhaler Inhale 2 puffs into the lungs every 6 (six) hours as needed for wheezing or shortness of breath. 10/04/21   Eden Emms, NP  aspirin EC 81 MG tablet Take 1 tablet (81 mg total) by mouth daily. 05/03/22   Tyrone Nine, MD  ezetimibe (ZETIA) 10 MG tablet Take 1 tablet (10 mg total) by mouth daily. 05/03/22   Tyrone Nine, MD  fentaNYL (DURAGESIC) 12 MCG/HR Place 1 patch onto the skin every 3 (three) days. 05/13/22   Etta Grandchild, MD  hydrochlorothiazide (MICROZIDE) 12.5 MG capsule Take 1 capsule (12.5 mg total) by mouth daily. 05/03/22   Tyrone Nine, MD  mirtazapine (REMERON) 7.5 MG tablet Take 1 tablet (7.5 mg total) by mouth at bedtime. 05/13/22   Etta Grandchild, MD  rosuvastatin (CRESTOR) 40 MG  tablet Take 1 tablet (40 mg total) by mouth daily. 05/03/22   Tyrone Nine, MD  thiamine (VITAMIN B-1) 50 MG tablet Take 1 tablet (50 mg total) by mouth daily. 05/13/22   Etta Grandchild, MD    Physical Exam: Vitals:   05/15/22 1445 05/15/22 1515 05/15/22 1530 05/15/22 1604  BP: (!) 151/76 (!) 141/63 (!) 144/76   Pulse: 72 81 86   Resp: 13 16 15    Temp:    (!) 97.3 F (36.3 C)  TempSrc:    Oral  SpO2: 100% 100% 99%    General:  Appears frail, mildly disheveled, cachectic Eyes:   EOMI,  normal lids, iris ENT:  grossly normal hearing, lips & tongue, mmm Neck:  no LAD, masses or thyromegaly Cardiovascular:  RRR, no m/r/g. No LE edema.  Respiratory:   CTA bilaterally with no wheezes/rales/rhonchi.  Normal respiratory effort. Abdomen:  soft, NT, ND Skin:  no rash or induration seen on limited exam Musculoskeletal:  grossly normal tone BUE/BLE, no bony abnormality Psychiatric:  blunted/confused mood and affect, speech sparse but appropriate, AOx1-2 Neurologic:  CN 2-12 grossly intact, moves all extremities in coordinated fashion   Radiological Exams on Admission: Independently reviewed - see discussion in A/P where applicable  CT Cervical Spine Wo Contrast  Result Date: 05/15/2022 CLINICAL DATA:  Neck trauma (Age >= 65y) EXAM: CT CERVICAL SPINE WITHOUT CONTRAST TECHNIQUE: Multidetector CT imaging of the cervical spine was performed without intravenous contrast. Multiplanar CT image reconstructions were also generated. RADIATION DOSE REDUCTION: This exam was performed according to the departmental dose-optimization program which includes automated exposure control, adjustment of the mA and/or kV according to patient size and/or use of iterative reconstruction technique. COMPARISON:  None Available. FINDINGS: Alignment: Degenerative anterolisthesis at C7-T1 greater than C3-C4. Skull base and vertebrae: Degenerative plate irregularity. No acute fracture. Soft tissues and spinal canal: No  prevertebral fluid or swelling. No visible canal hematoma. Disc levels: Multilevel degenerative changes are present including disc space narrowing, endplate osteophytes, and facet and uncovertebral hypertrophy. Upper chest: Emphysema. Other: Calcified plaque at the common carotid bifurcations. IMPRESSION: No acute cervical spine fracture. Electronically Signed   By: Guadlupe Spanish M.D.   On: 05/15/2022 08:51   CT HEAD WO CONTRAST ( )  Result Date: 05/15/2022 CLINICAL DATA:  Head trauma, minor (Age >= 65y) EXAM: CT HEAD WITHOUT CONTRAST TECHNIQUE: Contiguous axial images were obtained from the base of the skull through the vertex without intravenous contrast. RADIATION DOSE REDUCTION: This exam was performed according to the departmental dose-optimization program which includes automated exposure control, adjustment of the mA and/or kV according to patient size and/or use of iterative reconstruction technique. COMPARISON:  04/27/2022 FINDINGS: Brain: There is no acute intracranial hemorrhage, mass effect, or edema. Gray-white differentiation is preserved. There is no extra-axial fluid collection. Ventricles and sulci are stable in size and configuration. Patchy hypoattenuation in the supratentorial white matter probably reflects stable chronic microvascular ischemic changes. Vascular: There is atherosclerotic calcification at the skull base. Skull: Calvarium is unremarkable. Sinuses/Orbits: No acute finding. Other: None. IMPRESSION: No evidence of acute intracranial injury. Electronically Signed   By: Guadlupe Spanish M.D.   On: 05/15/2022 08:43   DG Pelvis 1-2 Views  Result Date: 05/15/2022 CLINICAL DATA:  Near syncope and fall EXAM: PELVIS - 1-2 VIEW COMPARISON:  None Available. FINDINGS: There is no evidence of pelvic fracture or diastasis. No pelvic bone lesions are seen. IMPRESSION: Negative. Electronically Signed   By: Marjo Bicker M.D.   On: 05/15/2022 08:20   DG Chest Portable 1 View  Result  Date: 05/15/2022 CLINICAL DATA:  Near syncope EXAM: PORTABLE CHEST 1 VIEW COMPARISON:  05/03/2022 FINDINGS: Hyperinflated lungs. No new consolidation or edema. No pleural effusion or pneumothorax. Stable cardiomediastinal contours. Left chest wall dual lead pacemaker. IMPRESSION: No acute process in the chest. Electronically Signed   By: Guadlupe Spanish M.D.   On: 05/15/2022 08:16    EKG: Independently reviewed.  NSR with rate 73; IVCD, LVH; nonspecific ST changes   Labs on Admission: I have personally reviewed the available labs and imaging studies at the time of the admission.  Pertinent labs:  K+ 2.9 Glucose 122 Albumin 2.8 HS troponin 17, 15 WBC 13.8 Hgb 12.5 UA: moderate Hgb, 30 protein   Assessment and Plan: Principal Problem:   Syncope and collapse Active Problems:   Complete heart block (HCC)   Hypokalemia   Essential hypertension   Tobacco abuse   Hyperlipidemia LDL goal <70   Pure hyperglyceridemia   Failure to thrive in adult    Syncope -Patient with recent admission for CHB now presenting with syncope -Most likely micturitional/vasovagal syncope but he has had some recent increase in tremors and seizure is a consideration; will order EEG -Cardiac syncope is a lesser consideration, given his recent pacemaker placement -Sedating medications may also be contributing; will hold fentanyl transdermal -Orthostatic vital signs now and in AM -Neuro checks  -Continue ASA -Replete KCl and recheck in AM, in case hypokalemia contributed  Complete heart block -Present during last hospitalization -s/p pacer -Pacer interrogation does not show obvious concern -Will monitor overnight on telemetry  Failure to thrive -During his prior hospitalization, family reports that he was recommended for SNF rehab but refused -They further report that he is now willing -He already has a qualifying inpatient stay -PT/OT/ST (cognitive) evaluations requested -TOC consult requested;  family appears to prefer CIR if possible -Palliative care team consult  Mild cognitive impairment -Suspect underlying dementia with intermittent delirium -Likely unmasked by recent changes in environment -Fentanyl patch may also be contributing; will hold and give prn low-dose oxy if needed -Continue Remeron  HLD -Continue Crestor, Zetia  HTN -Continue HCTZ  Chronic pain -I have reviewed this patient in the [place] Controlled Substances Reporting System.  He is not listed in the PDMP -Will hold fentanyl at this time  Tobacco dependence with COPD -Family reports none since last hospitalization -Will order nicotine patch -Continue albuterol prn  DNR -I have discussed code status with the patient and his children and they are in agreement that the patient would not desire resuscitation and would prefer to die a natural death should that situation arise. -He will need a gold out of facility DNR form at the time of discharge     Advance Care Planning:   Code Status: DNR   Consults: PT/OT/ST; TOC team  DVT Prophylaxis: Lovenox  Family Communication: Daughter and son were present throughout evaluation  Severity of Illness: The appropriate patient status for this patient is OBSERVATION. Observation status is judged to be reasonable and necessary in order to provide the required intensity of service to ensure the patient's safety. The patient's presenting symptoms, physical exam findings, and initial radiographic and laboratory data in the context of their medical condition is felt to place them at decreased risk for further clinical deterioration. Furthermore, it is anticipated that the patient will be medically stable for discharge from the hospital within 2 midnights of admission.   Author: Jonah Blue, MD 05/15/2022 4:42 PM  For on call review [email or web address].

## 2022-05-15 NOTE — ED Provider Notes (Signed)
[facility] EMERGENCY DEPARTMENT Provider Note   CSN: [id number] Arrival date & time: 05/15/22  0725     History  Chief Complaint  Patient presents with   Near Syncope    [name] is a 81 y.o. male.  Patient as above with significant medical history as below, including HTN, CAD, HLD, s/p pacemaker who presents to the ED with complaint of fall.  Patient reports just prior to arrival he was ambulating in his home, he "tripped over his feet."  And fell to the ground.  Struck his head on the ground.  Not recall LOC.  He is on a 81 mg aspirin, otherwise no thinners.  Patient denies chest pain, dyspnea, nausea, vomiting, numbness, tingling, incontinence, change in bowel or bladder function.  Felt he was in his normal state of health prior to episode.    Of note patient does have a pacemaker placed earlier this month for complete heart block, Dr. Steffanie Dunn 7/6 Tendril SDX 2088 TC (serial [id number]), pulse generator  (model Assurity DR, serial [id number]).   >>spoke with son further after arrival; reports pt with difficulty ambulating since discharge; unable to get out of bed or perform adl's. He had a witnessed syncopal episode this morning while trying to go to the restroom, thinks he struck back of his head on the wall in the bathroom.      Past Medical History:  Diagnosis Date   Asthma    as a child   Carotid arterial disease (HCC)    a. 11/2015 Carotid U/S: LICA 40-59%, RICA 100 CTO, nl subclavian arteries--f/u 1 yr.   Coronary atherosclerosis of native coronary artery    a. 09/2001 inflat MI/PCI: RCA 23m (3.0x[address] S7 BMS); b. 07/2006 Cath: LM nl, LAD 50p, LCX min irregs, OM1 large, min irregs, OM2 min irregs, RCA 20 ISR;  c. 10/2012 MV: EF 74%, no ischemia.   Essential hypertension    Heart murmur    since a child   Hyperlipidemia    Myocardial infarction [facility]) 2002   Pneumonia    age 30   Right inguinal hernia     Past Surgical History:   Procedure Laterality Date   CARDIAC CATHETERIZATION     Carotid stents     COLONOSCOPY     FRACTURE SURGERY     HERNIA REPAIR     ventral   INGUINAL HERNIA REPAIR Right 11/01/2016   Procedure: RIGHT INGUINAL HERNIA REPAIR WITH MESH;  Surgeon: Avel Peace, MD;  Location: [facility] OR;  Service: General;  Laterality: Right;   INSERTION OF MESH Right 11/01/2016   Procedure: INSERTION OF MESH;  Surgeon: Avel Peace, MD;  Location: [facility] OR;  Service: General;  Laterality: Right;   ORIF SHOULDER FRACTURE Right    PACEMAKER IMPLANT N/A 05/02/2022   Procedure: PACEMAKER IMPLANT;  Surgeon: Lanier Prude, MD;  Location: [facility] INVASIVE CV LAB;  Service: Cardiovascular;  Laterality: N/A;     The history is provided by the patient. No language interpreter was used.  Near Syncope Pertinent negatives include no chest pain, no abdominal pain, no headaches and no shortness of breath.       Home Medications Prior to Admission medications   Medication Sig Start Date End Date Taking? Authorizing Provider  albuterol (PROVENTIL) (2.5 MG/3ML) 0.083% nebulizer solution INHALE THE CONTENTS OF 1 VIAL INTO THE LUNGS VIA NEBULIZER EVERY 6 HOURS AS NEEDED FOR WHEEZING OR SHORTNESS OF BREATH Patient taking differently: Take 2.5 mg by nebulization  every 6 (six) hours as needed for wheezing or shortness of breath. INHALE THE CONTENTS OF 1 VIAL INTO THE LUNGS VIA NEBULIZER EVERY 6 HOURS AS NEEDED FOR WHEEZING OR SHORTNESS OF BREATH 02/11/22   Etta Grandchild, MD  albuterol (VENTOLIN HFA) 108 (90 Base) MCG/ACT inhaler Inhale 2 puffs into the lungs every 6 (six) hours as needed for wheezing or shortness of breath. 10/04/21   Eden Emms, NP  aspirin EC 81 MG tablet Take 1 tablet (81 mg total) by mouth daily. 05/03/22   Tyrone Nine, MD  ezetimibe (ZETIA) 10 MG tablet Take 1 tablet (10 mg total) by mouth daily. 05/03/22   Tyrone Nine, MD  fentaNYL (DURAGESIC) 12 MCG/HR Place 1 patch onto the skin every 3 (three) days.  05/13/22   Etta Grandchild, MD  hydrochlorothiazide (MICROZIDE) 12.5 MG capsule Take 1 capsule (12.5 mg total) by mouth daily. 05/03/22   Tyrone Nine, MD  mirtazapine (REMERON) 7.5 MG tablet Take 1 tablet (7.5 mg total) by mouth at bedtime. 05/13/22   Etta Grandchild, MD  rosuvastatin (CRESTOR) 40 MG tablet Take 1 tablet (40 mg total) by mouth daily. 05/03/22   Tyrone Nine, MD  thiamine (VITAMIN B-1) 50 MG tablet Take 1 tablet (50 mg total) by mouth daily. 05/13/22   Etta Grandchild, MD      Allergies    Penicillins    Review of Systems   Review of Systems  Constitutional:  Negative for chills and fever.  HENT:  Negative for facial swelling and trouble swallowing.   Eyes:  Negative for photophobia and visual disturbance.  Respiratory:  Negative for cough and shortness of breath.   Cardiovascular:  Positive for near-syncope. Negative for chest pain and palpitations.  Gastrointestinal:  Negative for abdominal pain, nausea and vomiting.  Endocrine: Negative for polydipsia and polyuria.  Genitourinary:  Negative for difficulty urinating and hematuria.  Musculoskeletal:  Negative for gait problem and joint swelling.  Skin:  Negative for pallor and rash.  Neurological:  Negative for syncope and headaches.  Psychiatric/Behavioral:  Negative for agitation and confusion.     Physical Exam Updated Vital Signs BP (!) 144/76   Pulse 86   Temp (!) 97.3 F (36.3 C) (Oral)   Resp 15   SpO2 99%  Physical Exam Vitals and nursing note reviewed.  Constitutional:      General: He is not in acute distress.    Appearance: Normal appearance. He is well-developed. He is not ill-appearing or diaphoretic.  HENT:     Head: Normocephalic and atraumatic. No raccoon eyes, Battle's sign, right periorbital erythema or left periorbital erythema.     Jaw: There is normal jaw occlusion. No trismus.     Right Ear: External ear normal.     Left Ear: External ear normal.     Mouth/Throat:     Mouth: Mucous  membranes are moist.  Eyes:     General: No scleral icterus.    Extraocular Movements: Extraocular movements intact.     Pupils: Pupils are equal, round, and reactive to light.  Cardiovascular:     Rate and Rhythm: Normal rate and regular rhythm.     Pulses: Normal pulses.     Heart sounds: Normal heart sounds.  Pulmonary:     Effort: Pulmonary effort is normal. No respiratory distress.     Breath sounds: Normal breath sounds.  Abdominal:     General: Abdomen is flat.     Palpations:  Abdomen is soft.     Tenderness: There is no abdominal tenderness.  Musculoskeletal:        General: Normal range of motion.     Cervical back: Full passive range of motion without pain and normal range of motion.     Right lower leg: No edema.     Left lower leg: No edema.  Skin:    General: Skin is warm and dry.     Capillary Refill: Capillary refill takes less than 2 seconds.  Neurological:     Mental Status: He is alert and oriented to person, place, and time.     GCS: GCS eye subscore is 4. GCS verbal subscore is 5. GCS motor subscore is 6.     Cranial Nerves: Cranial nerves 2-12 are intact. No dysarthria.     Sensory: Sensation is intact.     Motor: Motor function is intact. No tremor.     Coordination: Coordination is intact. Finger-Nose-Finger Test normal.     Gait: Gait is intact.  Psychiatric:        Mood and Affect: Mood normal.        Behavior: Behavior normal.     ED Results / Procedures / Treatments   Labs (all labs ordered are listed, but only abnormal results are displayed) Labs Reviewed  CBC WITH DIFFERENTIAL/PLATELET - Abnormal; Notable for the following components:      Result Value   WBC 13.8 (*)    RBC 3.89 (*)    Hemoglobin 12.5 (*)    HCT 37.1 (*)    Neutro Abs 10.7 (*)    All other components within normal limits  COMPREHENSIVE METABOLIC PANEL - Abnormal; Notable for the following components:   Potassium 2.9 (*)    Glucose, Bld 122 (*)    Total Protein 6.1 (*)     Albumin 2.8 (*)    All other components within normal limits  URINALYSIS, ROUTINE W REFLEX MICROSCOPIC - Abnormal; Notable for the following components:   Hgb urine dipstick MODERATE (*)    Protein, ur 30 (*)    All other components within normal limits  CBG MONITORING, ED - Abnormal; Notable for the following components:   Glucose-Capillary 127 (*)    All other components within normal limits  RESP PANEL BY RT-PCR (FLU A&B, COVID) ARPGX2  MAGNESIUM  TROPONIN I (HIGH SENSITIVITY)  TROPONIN I (HIGH SENSITIVITY)    EKG EKG Interpretation  Date/Time:  Wednesday May 15 2022 07:42:59 EDT Ventricular Rate:  73 PR Interval:  174 QRS Duration: 123 QT Interval:  432 QTC Calculation: 477 R Axis:   -81 Text Interpretation: Sinus rhythm Ventricular premature complex Nonspecific IVCD with LAD Left ventricular hypertrophy Anterior infarct, old Interpretation limited secondary to artifact Confirmed by Tanda Rockers ([id number]) on 05/15/2022 7:56:44 AM  Radiology CT Cervical Spine Wo Contrast  Result Date: 05/15/2022 CLINICAL DATA:  Neck trauma (Age >= 65y) EXAM: CT CERVICAL SPINE WITHOUT CONTRAST TECHNIQUE: Multidetector CT imaging of the cervical spine was performed without intravenous contrast. Multiplanar CT image reconstructions were also generated. RADIATION DOSE REDUCTION: This exam was performed according to the departmental dose-optimization program which includes automated exposure control, adjustment of the mA and/or kV according to patient size and/or use of iterative reconstruction technique. COMPARISON:  None Available. FINDINGS: Alignment: Degenerative anterolisthesis at C7-T1 greater than C3-C4. Skull base and vertebrae: Degenerative plate irregularity. No acute fracture. Soft tissues and spinal canal: No prevertebral fluid or swelling. No visible canal hematoma. Disc levels: Multilevel degenerative  changes are present including disc space narrowing, endplate osteophytes, and facet and  uncovertebral hypertrophy. Upper chest: Emphysema. Other: Calcified plaque at the common carotid bifurcations. IMPRESSION: No acute cervical spine fracture. Electronically Signed   By: Guadlupe Spanish M.D.   On: 05/15/2022 08:51   CT HEAD WO CONTRAST ( )  Result Date: 05/15/2022 CLINICAL DATA:  Head trauma, minor (Age >= 65y) EXAM: CT HEAD WITHOUT CONTRAST TECHNIQUE: Contiguous axial images were obtained from the base of the skull through the vertex without intravenous contrast. RADIATION DOSE REDUCTION: This exam was performed according to the departmental dose-optimization program which includes automated exposure control, adjustment of the mA and/or kV according to patient size and/or use of iterative reconstruction technique. COMPARISON:  04/27/2022 FINDINGS: Brain: There is no acute intracranial hemorrhage, mass effect, or edema. Kaelyn Nauta-white differentiation is preserved. There is no extra-axial fluid collection. Ventricles and sulci are stable in size and configuration. Patchy hypoattenuation in the supratentorial white matter probably reflects stable chronic microvascular ischemic changes. Vascular: There is atherosclerotic calcification at the skull base. Skull: Calvarium is unremarkable. Sinuses/Orbits: No acute finding. Other: None. IMPRESSION: No evidence of acute intracranial injury. Electronically Signed   By: Guadlupe Spanish M.D.   On: 05/15/2022 08:43   DG Pelvis 1-2 Views  Result Date: 05/15/2022 CLINICAL DATA:  Near syncope and fall EXAM: PELVIS - 1-2 VIEW COMPARISON:  None Available. FINDINGS: There is no evidence of pelvic fracture or diastasis. No pelvic bone lesions are seen. IMPRESSION: Negative. Electronically Signed   By: Marjo Bicker M.D.   On: 05/15/2022 08:20   DG Chest Portable 1 View  Result Date: 05/15/2022 CLINICAL DATA:  Near syncope EXAM: PORTABLE CHEST 1 VIEW COMPARISON:  05/03/2022 FINDINGS: Hyperinflated lungs. No new consolidation or edema. No pleural effusion or  pneumothorax. Stable cardiomediastinal contours. Left chest wall dual lead pacemaker. IMPRESSION: No acute process in the chest. Electronically Signed   By: Guadlupe Spanish M.D.   On: 05/15/2022 08:16    Procedures Procedures    Medications Ordered in ED Medications  aspirin EC tablet 81 mg (has no administration in time range)  fentaNYL (DURAGESIC) 12 MCG/HR 1 patch (has no administration in time range)  ezetimibe (ZETIA) tablet 10 mg (has no administration in time range)  hydrochlorothiazide (MICROZIDE) capsule 12.5 mg (has no administration in time range)  rosuvastatin (CRESTOR) tablet 40 mg (has no administration in time range)  mirtazapine (REMERON) tablet 7.5 mg (has no administration in time range)  albuterol (PROVENTIL) (2.5 MG/3ML) 0.083% nebulizer solution 2.5 mg (has no administration in time range)  enoxaparin (LOVENOX) injection 40 mg (has no administration in time range)  sodium chloride flush (NS) 0.9 % injection 3 mL (has no administration in time range)  acetaminophen (TYLENOL) tablet 650 mg (has no administration in time range)    Or  acetaminophen (TYLENOL) suppository 650 mg (has no administration in time range)  ondansetron (ZOFRAN) tablet 4 mg (has no administration in time range)    Or  ondansetron (ZOFRAN) injection 4 mg (has no administration in time range)  sodium chloride 0.9 % bolus 500 mL (0 mLs Intravenous Stopped 05/15/22 1205)  potassium chloride SA (KLOR-CON M) CR tablet 40 mEq (40 mEq Oral Given 05/15/22 0938)  sodium chloride 0.9 % bolus 500 mL (500 mLs Intravenous New Bag/Given 05/15/22 1306)    ED Course/ Medical Decision Making/ A&P Clinical Course as of 05/15/22 1618  Wed May 15, 2022  0854 Potassium(!): 2.9 [SG]  0855 Magnesium: 1.8 Potassium low, will  replace [SG]    Clinical Course User Index [SG] Sloan Leiter, DO                           Medical Decision Making Amount and/or Complexity of Data Reviewed Labs: ordered. Decision-making  details documented in ED Course. Radiology: ordered.  Risk Prescription drug management. Decision regarding hospitalization.    CC: near syncope, head injury  This patient presents to the Emergency Department for the above complaint. This involves an extensive number of treatment options and is a complaint that carries with it a high risk of complications and morbidity. Vital signs were reviewed. Serious etiologies considered.  Differential diagnoses for head trauma includes subdural hematoma, epidural hematoma, acute concussion, traumatic subarachnoid hemorrhage, cerebral contusions, simple syncope, near syncope, vasovagal, cardiac syncope etc.   Record review:  Previous records obtained and reviewed prior procedures, prior labs and imaging   Additional history obtained from son/daughter at bedside: Family ports progressive decline in patient's ability to ambulate.  Multiple falls over the past few weeks, he had LOC today.  Difficulty caring for the patient at home.  Patient unable to get out of bed or complete his ADLs independently; this is abnormal for the patient.  Medical and surgical history as noted above.   Work up as above, notable for:  Labs & imaging results that were available during my care of the patient were visualized by me and considered in my medical decision making.  Physical exam as above.   I ordered imaging studies which included CXR, CTH, CT c/s. I visualized the imaging, interpreted images, and I agree with radiologist interpretation.  Imaging unremarkable  Cardiac monitoring reviewed and interpreted personally which shows paced rhythm  UA with hematuria, this was noted previously.  Patient is to follow-up with urology as an outpatient.  Pacemaker was interrogated, there was  4 episodes yesterday of atrial tachycardia, longest episode was around 1 minute; atrially paced 60% of time, ventricular paced less than 1% of the time.  Management: Potassium  replacement, IV fluids  ED Course: Clinical Course as of 05/15/22 1618  Wed May 15, 2022  0854 Potassium(!): 2.9 [SG]  0855 Magnesium: 1.8 Potassium low, will replace [SG]    Clinical Course User Index [SG] Tanda Rockers A, DO     Reassessment:  Symptoms improved  Admission was considered.   Patient with recurrent falls, syncopal episode, unable to ambulate.  He also has hypokalemia.  He has leukocytosis but no clear evidence of sepsis. He would likely benefit from inpatient rehab following admission. Recommend admission for recurrent falls, syncope.  Spoke with Dr Ophelia Charter who will eval the patient regarding admission.              Social determinants of health include -  Social History   Socioeconomic History   Marital status: Married    Spouse name: Not on file   Number of children: Not on file   Years of education: Not on file   Highest education level: Not on file  Occupational History    Employer: RETIRED  Tobacco Use   Smoking status: Former    Packs/day: 1.00    Years: 44.00    Total pack years: 44.00    Types: Cigarettes    Quit date: 10/28/1998    Years since quitting: 23.5   Smokeless tobacco: Former    Types: Chew   Tobacco comments:    quit chewing tobacco 2001 ish  Substance and Sexual Activity   Alcohol use: No    Alcohol/week: 0.0 standard drinks of alcohol   Drug use: No   Sexual activity: Not on file  Other Topics Concern   Not on file  Social History Narrative   Lives in [place] with his wife.   Social Determinants of Health   Financial Resource Strain: Not on file  Food Insecurity: Not on file  Transportation Needs: Not on file  Physical Activity: Not on file  Stress: Not on file  Social Connections: Unknown (05/07/2022)   Social Connection and Isolation Panel [NHANES]    Frequency of Communication with Friends and Family: Not on file    Frequency of Social Gatherings with Friends and Family: Not on file    Attends  Religious Services: Not on file    Active Member of Clubs or Organizations: Not on file    Attends Banker Meetings: Not on file    Marital Status: Married  Intimate Partner Violence: Not on file      This chart was dictated using Chemical engineer.  Despite best efforts to proofread,  errors can occur which can change the documentation meaning.        Final Clinical Impression(s) / ED Diagnoses Final diagnoses:  Syncope, unspecified syncope type  Difficulty with activities of daily living  Recurrent falls  Hypokalemia    Rx / DC Orders ED Discharge Orders     None         Sloan Leiter, DO 05/15/22 1618

## 2022-05-15 NOTE — Telephone Encounter (Signed)
I have spoke to [place], she and the pt are currently at the ED. [name] stated that the ED will be keeping him over night to get him into rehab/24hr care.

## 2022-05-15 NOTE — Telephone Encounter (Signed)
Pt daughter called in and states pt fell again today, and she is requesting that he be placed somewhere with 24 hour care.   Requesting a call back to discuss this.

## 2022-05-16 ENCOUNTER — Observation Stay (HOSPITAL_COMMUNITY): Payer: Medicare Other

## 2022-05-16 DIAGNOSIS — J441 Chronic obstructive pulmonary disease with (acute) exacerbation: Secondary | ICD-10-CM | POA: Diagnosis not present

## 2022-05-16 DIAGNOSIS — E519 Thiamine deficiency, unspecified: Secondary | ICD-10-CM | POA: Diagnosis not present

## 2022-05-16 DIAGNOSIS — L89322 Pressure ulcer of left buttock, stage 2: Secondary | ICD-10-CM | POA: Diagnosis not present

## 2022-05-16 DIAGNOSIS — E43 Unspecified severe protein-calorie malnutrition: Secondary | ICD-10-CM | POA: Diagnosis present

## 2022-05-16 DIAGNOSIS — R55 Syncope and collapse: Secondary | ICD-10-CM | POA: Diagnosis not present

## 2022-05-16 DIAGNOSIS — W19XXXA Unspecified fall, initial encounter: Secondary | ICD-10-CM | POA: Diagnosis present

## 2022-05-16 DIAGNOSIS — I455 Other specified heart block: Secondary | ICD-10-CM | POA: Diagnosis not present

## 2022-05-16 DIAGNOSIS — J439 Emphysema, unspecified: Secondary | ICD-10-CM | POA: Diagnosis not present

## 2022-05-16 DIAGNOSIS — J449 Chronic obstructive pulmonary disease, unspecified: Secondary | ICD-10-CM | POA: Diagnosis present

## 2022-05-16 DIAGNOSIS — I442 Atrioventricular block, complete: Secondary | ICD-10-CM

## 2022-05-16 DIAGNOSIS — F17210 Nicotine dependence, cigarettes, uncomplicated: Secondary | ICD-10-CM | POA: Diagnosis not present

## 2022-05-16 DIAGNOSIS — I6529 Occlusion and stenosis of unspecified carotid artery: Secondary | ICD-10-CM | POA: Diagnosis not present

## 2022-05-16 DIAGNOSIS — Z789 Other specified health status: Secondary | ICD-10-CM

## 2022-05-16 DIAGNOSIS — R8289 Other abnormal findings on cytological and histological examination of urine: Secondary | ICD-10-CM | POA: Diagnosis not present

## 2022-05-16 DIAGNOSIS — Z95828 Presence of other vascular implants and grafts: Secondary | ICD-10-CM | POA: Diagnosis not present

## 2022-05-16 DIAGNOSIS — G3184 Mild cognitive impairment, so stated: Secondary | ICD-10-CM | POA: Diagnosis present

## 2022-05-16 DIAGNOSIS — I700 Atherosclerosis of aorta: Secondary | ICD-10-CM | POA: Diagnosis not present

## 2022-05-16 DIAGNOSIS — L89611 Pressure ulcer of right heel, stage 1: Secondary | ICD-10-CM | POA: Diagnosis present

## 2022-05-16 DIAGNOSIS — E8809 Other disorders of plasma-protein metabolism, not elsewhere classified: Secondary | ICD-10-CM

## 2022-05-16 DIAGNOSIS — G459 Transient cerebral ischemic attack, unspecified: Secondary | ICD-10-CM | POA: Diagnosis not present

## 2022-05-16 DIAGNOSIS — K5730 Diverticulosis of large intestine without perforation or abscess without bleeding: Secondary | ICD-10-CM | POA: Diagnosis not present

## 2022-05-16 DIAGNOSIS — R627 Adult failure to thrive: Secondary | ICD-10-CM | POA: Diagnosis present

## 2022-05-16 DIAGNOSIS — Z8701 Personal history of pneumonia (recurrent): Secondary | ICD-10-CM

## 2022-05-16 DIAGNOSIS — I10 Essential (primary) hypertension: Secondary | ICD-10-CM | POA: Diagnosis present

## 2022-05-16 DIAGNOSIS — D649 Anemia, unspecified: Secondary | ICD-10-CM | POA: Diagnosis not present

## 2022-05-16 DIAGNOSIS — Z8781 Personal history of (healed) traumatic fracture: Secondary | ICD-10-CM

## 2022-05-16 DIAGNOSIS — N200 Calculus of kidney: Secondary | ICD-10-CM | POA: Diagnosis present

## 2022-05-16 DIAGNOSIS — R319 Hematuria, unspecified: Secondary | ICD-10-CM

## 2022-05-16 DIAGNOSIS — R296 Repeated falls: Secondary | ICD-10-CM | POA: Diagnosis not present

## 2022-05-16 DIAGNOSIS — L89152 Pressure ulcer of sacral region, stage 2: Secondary | ICD-10-CM | POA: Diagnosis present

## 2022-05-16 DIAGNOSIS — L22 Diaper dermatitis: Secondary | ICD-10-CM | POA: Diagnosis present

## 2022-05-16 DIAGNOSIS — E781 Pure hyperglyceridemia: Secondary | ICD-10-CM | POA: Diagnosis present

## 2022-05-16 DIAGNOSIS — J9611 Chronic respiratory failure with hypoxia: Secondary | ICD-10-CM | POA: Diagnosis not present

## 2022-05-16 DIAGNOSIS — Z9981 Dependence on supplemental oxygen: Secondary | ICD-10-CM | POA: Diagnosis not present

## 2022-05-16 DIAGNOSIS — D638 Anemia in other chronic diseases classified elsewhere: Secondary | ICD-10-CM | POA: Diagnosis present

## 2022-05-16 DIAGNOSIS — I2510 Atherosclerotic heart disease of native coronary artery without angina pectoris: Secondary | ICD-10-CM | POA: Diagnosis not present

## 2022-05-16 DIAGNOSIS — I252 Old myocardial infarction: Secondary | ICD-10-CM | POA: Diagnosis not present

## 2022-05-16 DIAGNOSIS — R0689 Other abnormalities of breathing: Secondary | ICD-10-CM | POA: Diagnosis not present

## 2022-05-16 DIAGNOSIS — R011 Cardiac murmur, unspecified: Secondary | ICD-10-CM | POA: Diagnosis not present

## 2022-05-16 DIAGNOSIS — E876 Hypokalemia: Secondary | ICD-10-CM | POA: Diagnosis present

## 2022-05-16 DIAGNOSIS — I21A1 Myocardial infarction type 2: Secondary | ICD-10-CM | POA: Diagnosis not present

## 2022-05-16 DIAGNOSIS — G8929 Other chronic pain: Secondary | ICD-10-CM | POA: Diagnosis present

## 2022-05-16 DIAGNOSIS — E785 Hyperlipidemia, unspecified: Secondary | ICD-10-CM | POA: Diagnosis present

## 2022-05-16 DIAGNOSIS — E860 Dehydration: Secondary | ICD-10-CM | POA: Diagnosis not present

## 2022-05-16 DIAGNOSIS — Z950 Presence of cardiac pacemaker: Secondary | ICD-10-CM | POA: Diagnosis not present

## 2022-05-16 DIAGNOSIS — R32 Unspecified urinary incontinence: Secondary | ICD-10-CM | POA: Diagnosis present

## 2022-05-16 DIAGNOSIS — E119 Type 2 diabetes mellitus without complications: Secondary | ICD-10-CM | POA: Diagnosis not present

## 2022-05-16 DIAGNOSIS — R569 Unspecified convulsions: Secondary | ICD-10-CM | POA: Diagnosis not present

## 2022-05-16 DIAGNOSIS — Z7982 Long term (current) use of aspirin: Secondary | ICD-10-CM

## 2022-05-16 DIAGNOSIS — Z48812 Encounter for surgical aftercare following surgery on the circulatory system: Secondary | ICD-10-CM | POA: Diagnosis not present

## 2022-05-16 DIAGNOSIS — R2243 Localized swelling, mass and lump, lower limb, bilateral: Secondary | ICD-10-CM | POA: Diagnosis not present

## 2022-05-16 DIAGNOSIS — I6523 Occlusion and stenosis of bilateral carotid arteries: Secondary | ICD-10-CM | POA: Diagnosis not present

## 2022-05-16 DIAGNOSIS — G9340 Encephalopathy, unspecified: Secondary | ICD-10-CM | POA: Diagnosis not present

## 2022-05-16 DIAGNOSIS — F322 Major depressive disorder, single episode, severe without psychotic features: Secondary | ICD-10-CM | POA: Diagnosis not present

## 2022-05-16 DIAGNOSIS — K8689 Other specified diseases of pancreas: Secondary | ICD-10-CM | POA: Diagnosis not present

## 2022-05-16 DIAGNOSIS — I679 Cerebrovascular disease, unspecified: Secondary | ICD-10-CM | POA: Diagnosis not present

## 2022-05-16 DIAGNOSIS — Z955 Presence of coronary angioplasty implant and graft: Secondary | ICD-10-CM

## 2022-05-16 DIAGNOSIS — Z66 Do not resuscitate: Secondary | ICD-10-CM | POA: Diagnosis present

## 2022-05-16 DIAGNOSIS — N281 Cyst of kidney, acquired: Secondary | ICD-10-CM | POA: Diagnosis not present

## 2022-05-16 DIAGNOSIS — J9610 Chronic respiratory failure, unspecified whether with hypoxia or hypercapnia: Secondary | ICD-10-CM | POA: Diagnosis present

## 2022-05-16 DIAGNOSIS — R531 Weakness: Secondary | ICD-10-CM | POA: Diagnosis not present

## 2022-05-16 DIAGNOSIS — Z9181 History of falling: Secondary | ICD-10-CM

## 2022-05-16 DIAGNOSIS — Z8719 Personal history of other diseases of the digestive system: Secondary | ICD-10-CM

## 2022-05-16 DIAGNOSIS — Z7401 Bed confinement status: Secondary | ICD-10-CM | POA: Diagnosis not present

## 2022-05-16 DIAGNOSIS — R5383 Other fatigue: Secondary | ICD-10-CM | POA: Diagnosis not present

## 2022-05-16 DIAGNOSIS — F32A Depression, unspecified: Secondary | ICD-10-CM | POA: Diagnosis not present

## 2022-05-16 DIAGNOSIS — Z20822 Contact with and (suspected) exposure to covid-19: Secondary | ICD-10-CM | POA: Diagnosis present

## 2022-05-16 DIAGNOSIS — Z681 Body mass index (BMI) 19 or less, adult: Secondary | ICD-10-CM | POA: Diagnosis not present

## 2022-05-16 LAB — TYPE AND SCREEN
ABO/RH(D): O POS
Antibody Screen: NEGATIVE

## 2022-05-16 LAB — BASIC METABOLIC PANEL
Anion gap: 8 (ref 5–15)
BUN: 20 mg/dL (ref 8–23)
CO2: 30 mmol/L (ref 22–32)
Calcium: 8.9 mg/dL (ref 8.9–10.3)
Chloride: 101 mmol/L (ref 98–111)
Creatinine, Ser: 0.93 mg/dL (ref 0.61–1.24)
GFR, Estimated: >60 mL/min (ref >60)
Glucose, Bld: 90 mg/dL (ref 70–99)
Potassium: 3 mmol/L — ABNORMAL LOW (ref 3.5–5.1)
Sodium: 139 mmol/L (ref 135–145)

## 2022-05-16 LAB — URINALYSIS, ROUTINE W REFLEX MICROSCOPIC
Bacteria, UA: NONE SEEN
RBC / HPF: >50 RBC/hpf — ABNORMAL HIGH (ref 0–5)

## 2022-05-16 LAB — CBC
HCT: 31.8 % — ABNORMAL LOW (ref 39.0–52.0)
Hemoglobin: 10.5 g/dL — ABNORMAL LOW (ref 13.0–17.0)
MCH: 31.4 pg (ref 26.0–34.0)
MCHC: 33 g/dL (ref 30.0–36.0)
MCV: 95.2 fL (ref 80.0–100.0)
Platelets: 297 10*3/uL (ref 150–400)
RBC: 3.34 MIL/uL — ABNORMAL LOW (ref 4.22–5.81)
RDW: 14.7 % (ref 11.5–15.5)
WBC: 7.8 10*3/uL (ref 4.0–10.5)
nRBC: 0 % (ref 0.0–0.2)

## 2022-05-16 LAB — POCT GLYCOSYLATED HEMOGLOBIN (HGB A1C): Hemoglobin A1C: 5.5 % (ref 4.0–5.6)

## 2022-05-16 LAB — MAGNESIUM: Magnesium: 1.8 mg/dL (ref 1.7–2.4)

## 2022-05-16 MED ORDER — MEDIHONEY WOUND/BURN DRESSING EX PSTE
1.0000 | PASTE | Freq: Every day | CUTANEOUS | Status: DC
Start: 2022-05-16 — End: 2022-05-20
  Administered 2022-05-17 – 2022-05-19 (×2): 1 via TOPICAL
  Filled 2022-05-16: qty 44

## 2022-05-16 MED ORDER — ENSURE ENLIVE PO LIQD
237.0000 mL | Freq: Three times a day (TID) | ORAL | Status: DC
  Administered 2022-05-16 – 2022-05-19 (×10): 237 mL via ORAL

## 2022-05-16 MED ORDER — THIAMINE HCL 100 MG/ML IJ SOLN
100.0000 mg | Freq: Every day | INTRAMUSCULAR | Status: DC
  Administered 2022-05-16: 100 mg via INTRAVENOUS
  Filled 2022-05-16: qty 2

## 2022-05-16 MED ORDER — POTASSIUM CHLORIDE CRYS ER 20 MEQ PO TBCR
40.0000 meq | EXTENDED_RELEASE_TABLET | Freq: Once | ORAL | Status: AC
  Administered 2022-05-16: 40 meq via ORAL
  Filled 2022-05-16: qty 2

## 2022-05-16 MED ORDER — MAGNESIUM SULFATE 2 GM/50ML IV SOLN
2.0000 g | Freq: Once | INTRAVENOUS | Status: AC
Start: 2022-05-16 — End: 2022-05-16
  Administered 2022-05-16: 2 g via INTRAVENOUS
  Filled 2022-05-16: qty 50

## 2022-05-16 MED ORDER — ADULT MULTIVITAMIN W/MINERALS CH
1.0000 | ORAL_TABLET | Freq: Every day | ORAL | Status: DC
  Administered 2022-05-16 – 2022-05-20 (×5): 1 via ORAL
  Filled 2022-05-16 (×5): qty 1

## 2022-05-16 NOTE — TOC Initial Note (Addendum)
Transition of Care [facility]) - Initial/Assessment Note    Patient Details  Name: [name] MRN: [id number] Date of Birth: 1940/11/25  [facility] CM/SW Contact:    Delilah Shan, LCSWA Phone Number: 05/16/2022, 12:41 PM  Clinical Narrative:                  CSW received consult for possible SNF placement at time of discharge. CSW spoke with patient and patients son [name] at bedside regarding PT recommendation of SNF placement at time of discharge. Patient comes from home alone.Patient and patients son  expressed understanding of PT recommendation and is agreeable to SNF placement at time of discharge. Patient and patients son gave CSW permission to fax out initial referral near the [place] area. Patient and patients son would like for CSW to call patients daughter [name] with SNF bed offers to help choose SNF choice for patient.CSW discussed insurance authorization process with patient and patients son.Patients son reports patient has not received the covid vaccines. Patient and patients son request for CSW to update patients daughter [name]. CSW called patients daughter [name] and updated her on dc plan. CSW to follow up with patients daughter [name] with SNF bed offers when available.No further questions reported at this time. CSW to continue to follow and assist with discharge planning needs.   Update- CSW received consult for palliative services to follow patient at SNF. CSW spoke with patients daughter [name] who gave CSW permission to make referral to [facility] for palliative services to follow patient at SNF . CSW called [facility] and LVM. CSW awaiting callback. Expected Discharge Plan: Skilled Nursing Facility Barriers to Discharge: Continued Medical Work up   Patient Goals and CMS Choice Patient states their goals for this hospitalization and ongoing recovery are:: SNF CMS Medicare.gov Compare Post Acute Care list provided to:: Patient Choice  offered to / list presented to : Patient  Expected Discharge Plan and Services Expected Discharge Plan: Skilled Nursing Facility In-house Referral: Clinical Social Work     Living arrangements for the past 2 months: Single Family Home                                      Prior Living Arrangements/Services Living arrangements for the past 2 months: Single Family Home Lives with:: Self Patient language and need for interpreter reviewed:: Yes Do you feel safe going back to the place where you live?: No   SNF  Need for Family Participation in Patient Care: Yes (Comment) Care giver support system in place?: Yes (comment)   Criminal Activity/Legal Involvement Pertinent to Current Situation/Hospitalization: No - Comment as needed  Activities of Daily Living Home Assistive Devices/Equipment: Bathtub lift, Bedside commode/3-in-1 ADL Screening (condition at time of admission) Patient's cognitive ability adequate to safely complete daily activities?: No Is the patient deaf or have difficulty hearing?: No Does the patient have difficulty seeing, even when wearing glasses/contacts?: No Does the patient have difficulty concentrating, remembering, or making decisions?: Yes Patient able to express need for assistance with ADLs?: Yes Does the patient have difficulty dressing or bathing?: Yes Independently performs ADLs?: No Communication: Independent Dressing (OT): Needs assistance Is this a change from baseline?: Pre-admission baseline Grooming: Needs assistance Is this a change from baseline?: Pre-admission baseline Feeding: Needs assistance Is this a change from baseline?: Pre-admission baseline Bathing: Dependent Is this a change from baseline?: Pre-admission baseline Toileting: Needs assistance  Is this a change from baseline?: Pre-admission baseline In/Out Bed: Needs assistance Is this a change from baseline?: Pre-admission baseline Walks in Home: Needs assistance Is this a  change from baseline?: Pre-admission baseline Does the patient have difficulty walking or climbing stairs?: Yes Weakness of Legs: Both Weakness of Arms/Hands: Both  Permission Sought/Granted Permission sought to share information with : Case Manager, Magazine features editor, Family Supports Permission granted to share information with : Yes, Verbal Permission Granted  Share Information with NAME: [name]  Permission granted to share info w AGENCY: SNF  Permission granted to share info w Relationship: daughter  Permission granted to share info w Contact Information: [name] [phone]  Emotional Assessment Appearance:: Appears stated age Attitude/Demeanor/Rapport: Gracious Affect (typically observed): Calm Orientation: :  (WDL) Alcohol / Substance Use: Not Applicable Psych Involvement: No (comment)  Admission diagnosis:  Syncope and collapse [R55] Hypokalemia [E87.6] Difficulty with activities of daily living [Z78.9] Recurrent falls [R29.6] Syncope, unspecified syncope type [R55] Syncope [R55] Patient Active Problem List   Diagnosis Date Noted   Syncope 05/16/2022   Syncope and collapse 05/15/2022   Failure to thrive in adult 05/15/2022   Anemia due to acquired thiamine deficiency 05/13/2022   Diabetes mellitus (HCC) 05/13/2022   Current severe episode of major depressive disorder without psychotic features without prior episode (HCC) 05/13/2022   Pressure injury of skin 05/01/2022   Complete heart block (HCC) 04/29/2022   Hematuria 04/28/2022   Protein-calorie malnutrition, severe (HCC) 04/28/2022   Depression 04/28/2022   Tobacco abuse 04/28/2022   Hypokalemia 10/04/2021   Bilateral lower extremity edema 10/04/2021   Aortic atherosclerosis (HCC) 09/13/2021   Chronic hypoxemic respiratory failure (HCC) 08/16/2021   Carotid stenosis, asymptomatic, bilateral 12/08/2017   Demand ischemia of myocardium (HCC)    Essential hypertension    COPD III/ hypercarbic  10/07/2016   Chronic respiratory failure with hypercapnia (HCC) 10/07/2016   Pure hyperglyceridemia 09/04/2016   Moderate persistent asthma without complication 09/04/2016   Essential hypertension, benign 10/10/2009   Hyperlipidemia LDL goal <70 10/07/2008   CORONARY ATHEROSCLEROSIS NATIVE CORONARY ARTERY 10/07/2008   Cerebrovascular disease 10/07/2008   PCP:  Etta Grandchild, MD Pharmacy:   [id number] - [place], [place] - [address] AT [facility] OF [address] [place] [address] Phone: [phone] Fax: [phone]  [facility] #[id number] [place], [place] - [address] AT [facility] OF [address] & [facility] [address] [place] [address] Phone: [phone] Fax: [phone]     Social Determinants of Health (SDOH) Interventions    Readmission Risk Interventions     No data to display

## 2022-05-16 NOTE — Evaluation (Addendum)
SLP Cancellation Note  Patient Details Name: [name] MRN: [id number] DOB: 1941/06/28   Cancelled treatment:       Reason Eval/Treat Not Completed: Other (comment) (pt eating lunch at this time; will continue efforts)  MD arrived to room to see pt also.    Rolena Infante, MS [facility] SLP Acute Rehab Services Office [phone] Pager [phone]   Chales Abrahams 05/16/2022, 11:40 AM

## 2022-05-16 NOTE — Progress Notes (Signed)
EEG complete - results pending 

## 2022-05-16 NOTE — Consult Note (Signed)
Consultation Note Date: 05/16/2022   Patient Name: [name]  DOB: 10-02-1941  MRN: [id number]  Age / Sex: 81 y.o., male  PCP: Janith Lima, MD Referring Physician: Geradine Girt, DO  Reason for Consultation: Establishing goals of care  HPI/Patient Profile: 81 y.o. male  with past medical history of CAD; HTN; HLD; and recent pacemaker placement presenting with near syncope.   He was last admitted from 7/1-7 for CHB and had pacemaker placement.  He was recommended for [facility] PT/OT.  The family called the PCP the day of dc to ask for referral back to rehab.  He has had 2 prior hospitalizations with SNF stays and the family desired SNF rehab again.  He saw his PCP on 7/17 for hospitalization f/u and was referred to hospice.  Clinic SW spoke with the family yesterday and was given the option to hire private duty caregivers or to be placed.  Daughter called the PCP today to report that "the ED will be keeping him over night to get him into rehab/24hr care." admitted on 05/15/2022 with syncope, failure to thrive.   PMT has been consulted to assist with goals of care conversation.  Clinical Assessment and Goals of Care:  I have reviewed medical records including EPIC notes, labs and imaging, assessed the patient and then had a phone conversation with patient's daughter Malachy Mood to discuss diagnosis prognosis, [place], EOL wishes, disposition and options.  I introduced Palliative Medicine as specialized medical care for people living with serious illness. It focuses on providing relief from the symptoms and stress of a serious illness. The goal is to improve quality of life for both the patient and the family.  We discussed a brief life review of the patient and then focused on their current illness.  The natural disease trajectory and expectations at EOL were discussed.  I attempted to elicit values and goals of care  important to the patient.    Medical History Review and Understanding:  We reviewed patient's overall decline, chronic comorbidities including COPD with heavy smoking until recently.  Social History: Patient was living at home prior to admission.  He is married with his wife residing at [facility] due to her dementia.  His current decline was noted as more rapid when she was moved there in October.  He has 1 son and 1 daughter.  He is described as a stubborn man.  He was smoking about a carton every 4 days since his wife's admission to a facility and has basically "given up".  Functional and Nutritional State: Family's tries desperately to encourage patient to mobilize however he does not much.  He also eats very little and drinks very little water.  They do not feel he is able to stay at home anymore due to his recurrent falls.  Code Status: Concepts specific to code status, artifical feeding and hydration, and rehospitalization were considered and discussed.   Discussion: Patient's daughter Malachy Mood shares that while he was never in the best health, his recurrent falls and overall decline have been much worse since October of last year.  His most recent fall occurred when he drove himself to his wife's facility without telling anyone and fell on their rug hitting his head on the concrete.  He was unable to care for her due to her dementia.  He has refused SNF during previous hospitalizations but is now willing to consider a short-term stay for rehabilitation.  They tried home health PT with [facility]  but this was not very helpful at all.  However, he would never be agreeable to long-term placement.  Explored daughter's thoughts on next steps, as I am very concerned he will not regain enough strength in the short-term stay to safely return home.  She shares that family agrees and unlike her mother, his poor physical health will likely also be a complicating factor.  Family is well aware that  there is not much they can do at that point.  Her sister-in-law has been reviewing options for hospice at home rather than long-term placement.  Counseled on the available resources that are typically available with home hospice, advising that often times the majority of care still falls primarily on family.  She is very appreciative of this information and will consider this moving forward.  Recommended at the least, outpatient palliative care follow-up to determine how he does at SNF and possibly transition to hospice moving forward.  She is agreeable.    The difference between aggressive medical intervention and comfort care was considered in light of the patient's goals of care. Hospice and Palliative Care services outpatient were explained and offered.   Discussed the importance of continued conversation with family and the medical providers regarding overall plan of care and treatment options, ensuring decisions are within the context of the patient's values and GOCs.   Questions and concerns were addressed.  The family was encouraged to call with questions or concerns.  PMT will continue to support holistically.   SUMMARY OF RECOMMENDATIONS   -DNR -Continue current care -Goal is for SNF placement with palliative care follow-up, family leaning towards transition to hospice afterwards if he is still declining and unable to care for for himself at home -[facility] assistance appreciated with outpatient palliative care referral -Psychosocial emotional support provided -PMT will continue to follow and support  Prognosis: Poor long-term prognosis given failure to thrive, chronic comorbidities, recurrent falls, nutritional and functional decline, advanced age, unhealthy behaviors such as smoking  Discharge Planning: Skilled Nursing Facility for rehab with Palliative care service follow-up      Primary Diagnoses: Present on Admission:  Syncope and collapse  Tobacco abuse  Pure  hyperglyceridemia  Hyperlipidemia LDL goal <70  Hypokalemia  Complete heart block (HCC)  Essential hypertension  Failure to thrive in adult  Syncope   Physical Exam Vitals and nursing note reviewed.  Constitutional:      General: He is not in acute distress.    Appearance: He is ill-appearing.  Cardiovascular:     Rate and Rhythm: Normal rate.  Pulmonary:     Effort: Pulmonary effort is normal.  Neurological:     Mental Status: He is alert. He is confused.  Psychiatric:        Mood and Affect: Mood normal.        Behavior: Behavior is cooperative.     Vital Signs: BP (!) 107/53 (BP Location: Right Arm)   Pulse 93   Temp 97.7 F (36.5 C) (Oral)   Resp 18   Ht 5\' 9"  (1.753 m)   Wt 47.2 kg   SpO2 93%   BMI 15.36 kg/m  Pain Scale: 0-10   Pain Score: 0-No pain   SpO2: SpO2: 93 % O2 Device:SpO2: 93 % O2 Flow Rate: .    Palliative Assessment/Data:     MDM: High   Kennie Snedden , PA-C  Palliative Medicine Team Team phone # [phone]  Thank you for allowing the Palliative Medicine Team to assist in the care  of this patient. Please utilize secure chat with additional questions, if there is no response within 30 minutes please call the above phone number.  Palliative Medicine Team providers are available by phone from 7am to 7pm daily and can be reached through the team cell phone.  Should this patient require assistance outside of these hours, please call the patient's attending physician.

## 2022-05-16 NOTE — NC FL2 (Signed)
[place] MEDICAID FL2 LEVEL OF CARE SCREENING TOOL     IDENTIFICATION  Patient Name: [name] Birthdate: [birth year] Sex: male Admission Date (Current Location): 05/15/2022  [facility] and [place] Number:  Producer, television/film/video and Address:  [facility], [address], [place], [place] [address]      Provider Number: [id number]  Attending Physician Name and Address:  Joseph Art, DO  Relative Name and Phone Number:  [name] [phone]    Current Level of Care: Hospital Recommended Level of Care: Skilled Nursing Facility Prior Approval Number:    Date Approved/Denied:   PASRR Number: [id number]  Discharge Plan: SNF    Current Diagnoses: Patient Active Problem List   Diagnosis Date Noted   Syncope 05/16/2022   Syncope and collapse 05/15/2022   Failure to thrive in adult 05/15/2022   Anemia due to acquired thiamine deficiency 05/13/2022   Diabetes mellitus (HCC) 05/13/2022   Current severe episode of major depressive disorder without psychotic features without prior episode (HCC) 05/13/2022   Pressure injury of skin 05/01/2022   Complete heart block (HCC) 04/29/2022   Hematuria 04/28/2022   Protein-calorie malnutrition, severe (HCC) 04/28/2022   Depression 04/28/2022   Tobacco abuse 04/28/2022   Hypokalemia 10/04/2021   Bilateral lower extremity edema 10/04/2021   Aortic atherosclerosis (HCC) 09/13/2021   Chronic hypoxemic respiratory failure (HCC) 08/16/2021   Carotid stenosis, asymptomatic, bilateral 12/08/2017   Demand ischemia of myocardium (HCC)    Essential hypertension    COPD III/ hypercarbic 10/07/2016   Chronic respiratory failure with hypercapnia (HCC) 10/07/2016   Pure hyperglyceridemia 09/04/2016   Moderate persistent asthma without complication 09/04/2016   Essential hypertension, benign 10/10/2009   Hyperlipidemia LDL goal <70 10/07/2008   CORONARY ATHEROSCLEROSIS NATIVE CORONARY ARTERY 10/07/2008    Cerebrovascular disease 10/07/2008    Orientation RESPIRATION BLADDER Height & Weight      (WDL)  Normal Continent Weight: 104 lb (47.2 kg) Height:  5\' 9"  (175.3 cm)  BEHAVIORAL SYMPTOMS/MOOD NEUROLOGICAL BOWEL NUTRITION STATUS      Incontinent Diet (Please see discharge summary)  AMBULATORY STATUS COMMUNICATION OF NEEDS Skin   Limited Assist Verbally Other (Comment) (Approp. for ethnicity,dry,flaky,abrasion,leg,bil.,foam,ecchymosis,arm,bilateral,erythema,chest,knee,bilateral,PI coccyx,R,L,stage 2,foam lift dressing,changed,every 3 days,early partial granulation,erythema blanchable unattached edges see additonal info)                       Personal Care Assistance Level of Assistance  Bathing, Feeding, Dressing Bathing Assistance: Limited assistance Feeding assistance: Independent (able to feed self;Cardiac) Dressing Assistance: Limited assistance     Functional Limitations Info  Sight, Hearing, Speech Sight Info: Impaired Hearing Info: Impaired Speech Info: Adequate    SPECIAL CARE FACTORS FREQUENCY  PT (By licensed PT), OT (By licensed OT)     PT Frequency: 5x min weekly OT Frequency: 5x min weekly            Contractures Contractures Info: Not present    Additional Factors Info  Code Status, Allergies, Psychotropic Code Status Info: DNR Allergies Info: Penicillins Psychotropic Info: mirtazapine (REMERON) tablet 7.5 mg daily at bedtime         Current Medications (05/16/2022):  This is the current hospital active medication list Current Facility-Administered Medications  Medication Dose Route Frequency Provider Last Rate Last Admin   acetaminophen (TYLENOL) tablet 650 mg  650 mg Oral Q6H PRN 05/18/2022, MD       Or   acetaminophen (TYLENOL) suppository 650 mg  650 mg Rectal Q6H  PRN Jonah Blue, MD       albuterol (PROVENTIL) (2.5 MG/3ML) 0.083% nebulizer solution 2.5 mg  2.5 mg Nebulization Q6H PRN Jonah Blue, MD       ezetimibe (ZETIA)  tablet 10 mg  10 mg Oral Daily Jonah Blue, MD   10 mg at 05/16/22 1443   feeding supplement (ENSURE ENLIVE / ENSURE PLUS) liquid 237 mL  237 mL Oral BID BM Jonah Blue, MD   237 mL at 05/16/22 0953   leptospermum manuka honey (MEDIHONEY) paste 1 Application  1 Application Topical Daily Marlin Canary U, DO       magnesium sulfate IVPB 2 g 50 mL  2 g Intravenous Once Vann, Jessica U, DO       mirtazapine (REMERON) tablet 7.5 mg  7.5 mg Oral QHS Jonah Blue, MD   7.5 mg at 05/15/22 2159   nicotine (NICODERM CQ - dosed in mg/24 hours) patch 14 mg  14 mg Transdermal Daily Jonah Blue, MD   14 mg at 05/16/22 0955   ondansetron (ZOFRAN) tablet 4 mg  4 mg Oral Q6H PRN Jonah Blue, MD       Or   ondansetron [facility]) injection 4 mg  4 mg Intravenous Q6H PRN Jonah Blue, MD       oxyCODONE (Oxy IR/ROXICODONE) immediate release tablet 5 mg  5 mg Oral Q4H PRN Jonah Blue, MD       potassium chloride SA (KLOR-CON M) CR tablet 40 mEq  40 mEq Oral Once Vann, Jessica U, DO       rosuvastatin (CRESTOR) tablet 40 mg  40 mg Oral Daily Jonah Blue, MD   40 mg at 05/16/22 1540   sodium chloride flush (NS) 0.9 % injection 3 mL  3 mL Intravenous Steva Colder, MD   3 mL at 05/16/22 0954   thiamine (B-1) injection 100 mg  100 mg Intravenous Daily Joseph Art, DO         Discharge Medications: Please see discharge summary for a list of discharge medications.  Relevant Imaging Results:  Relevant Lab Results:   Additional Information [id number],PI heel right stage 1,foam lift dressing,clean,dry,intact,every 3 days,erythema,non-blanchable,PI coccyx,R,L,stage 2,foam lift dressing,changed, every 3 days,early partial granulation,erythema,blanchable,PI sacrum,R,L,stage 2,Incision closed chest L,upper,adhesive strips,clean,dry,intact,dressing in place  Delilah Shan, [address]

## 2022-05-16 NOTE — Significant Event (Signed)
Patient's nurse notified me that patient has developed gross hematuria with clots.  We will hold Lovenox, place patient on SCDs for DVT prophylaxis, check stat CBC we will check bladder scan and CT renal study check UA.  May need to hold morning dose of aspirin if hematuria persists.  We will continue to monitor.  Further plans based on the test ordered.  Midge Minium

## 2022-05-16 NOTE — Progress Notes (Signed)
Transported to Radiology with Amy,RN for CT Renal stone.

## 2022-05-16 NOTE — Telephone Encounter (Signed)
Check status on PA med has been APPROVED. It states This request has been approved. Effective 05/14/2022 through 05/15/2023. Faxed approval to pof...Raechel Chute

## 2022-05-16 NOTE — Consult Note (Signed)
WOC Nurse Consult Note: Reason for Consult: bilateral nonintact pressure injuries to sacrum.  In combination with incontinence associated dermatitis from being found "down" for a prolonged period after a fall at home.  Recent hospitalization and SNF was recommended but patient refused at that time.  Patient and family now agree patient is in need of SNF rehab. Failure to thrive and mild cognitive impairment are potential barriers to healing and increased risk of breakdown.  Wound type:pressure and moisture Pressure Injury POA: Yes Measurement: sacrum (upper buttocks, bilaterally) 1cm x 1 cm x 0.1 cm fibrin to wound bed Right heel:  2 cm round intact maroon discoloration, consistent with deep tissue injury Wound WLN:LGXQJJ fibrin Drainage (amount, consistency, odor) minimal serosanguinous  no odor Periwound:blanchable erythema Dressing procedure/placement/frequency: Offload pressure to heels with prevalon. CLeanse buttocks/sacral area with soap and water and pat dry.  Apply medihoney to open wounds. Cleanse the wound with NS. Pat dry then apply a nickel thick layer of MediHoney directly to the wound or onto a dressing. Next, cover with gauze and secure with sacral foam.   Change dressing daily.   Will not follow at this time.  Please re-consult if needed.  Maple Hudson MSN, RN, FNP-BC CWON Wound, Ostomy, Continence Nurse Pager [phone]

## 2022-05-16 NOTE — Evaluation (Signed)
Occupational Therapy Evaluation Patient Details Name: [name] MRN: [id number] DOB: June 06, 1941 Today's Date: 05/16/2022   History of Present Illness 80 y.o. male presents to [facility] hospital on 05/14/2022 with possible syncopal episode as well as a fall at home. Pt requiring assistance with ADLs since PPM placement in early July 2023. PMH includes CAD, HTN, HLF, PPM placement, MI.   Clinical Impression   Patient admitted for the diagnosis above.  PTA he lives at home alone, with what sounds like, assist as needed from family for iADL and community mobility.  Patient states he was not using a RW or SPC for mobility.  Deficits impacting independence are listed below.  Patient currently is needing up to Min Guard/supervision for in room mobility without an AD, and lower body ADL from a sit/stand level.  OT can continue efforts in the acute setting, and discharge recommendation will depend on family involvement.  Patient could go home with increased oversight, if not, SNF for short term rehab is recommended.       Recommendations for follow up therapy are one component of a multi-disciplinary discharge planning process, led by the attending physician.  Recommendations may be updated based on patient status, additional functional criteria and insurance authorization.   Follow Up Recommendations  Follow physician's recommendations for discharge plan and follow up therapies    Assistance Recommended at Discharge Intermittent Supervision/Assistance  Patient can return home with the following A little help with walking and/or transfers;Assistance with cooking/housework;Direct supervision/assist for medications management;Direct supervision/assist for financial management;Assist for transportation;Help with stairs or ramp for entrance;A little help with bathing/dressing/bathroom    Functional Status Assessment  Patient has had a recent decline in their functional status and demonstrates the ability to  make significant improvements in function in a reasonable and predictable amount of time.  Equipment Recommendations  None recommended by OT    Recommendations for Other Services       Precautions / Restrictions Precautions Precautions: Fall Restrictions Weight Bearing Restrictions: No      Mobility Bed Mobility               General bed mobility comments: seated in recliner    Transfers Overall transfer level: Needs assistance   Transfers: Sit to/from Stand Sit to Stand: Supervision                  Balance Overall balance assessment: Needs assistance Sitting-balance support: No upper extremity supported, Feet supported Sitting balance-Leahy Scale: Good     Standing balance support: No upper extremity supported Standing balance-Leahy Scale: Fair Standing balance comment: patient able to walk to the bathroom, and completed light grooming task at stand level                           ADL either performed or assessed with clinical judgement   ADL       Grooming: Supervision/safety;Standing;Wash/dry hands   Upper Body Bathing: Supervision/ safety;Sitting   Lower Body Bathing: Min guard;Sit to/from stand   Upper Body Dressing : Supervision/safety;Sitting   Lower Body Dressing: Min guard;Sit to/from stand   Toilet Transfer: Min guard;Ambulation;Regular Toilet   Toileting- Clothing Manipulation and Hygiene: Independent;Sitting/lateral lean               Vision Patient Visual Report: No change from baseline       Perception Perception Perception: Not tested   Praxis Praxis Praxis: Not tested    Pertinent Vitals/Pain Pain Assessment  Pain Assessment: No/denies pain     Hand Dominance Right   Extremity/Trunk Assessment Upper Extremity Assessment Upper Extremity Assessment: RUE deficits/detail;LUE deficits/detail RUE Deficits / Details: decreased end range shoulder flexion - chronic RUE Sensation: WNL RUE Coordination:  WNL LUE Deficits / Details: decreased end range shoulder flexion - chronic LUE Sensation: WNL LUE Coordination: WNL   Lower Extremity Assessment Lower Extremity Assessment: Defer to PT evaluation   Cervical / Trunk Assessment Cervical / Trunk Assessment: Kyphotic   Communication Communication Communication: HOH   Cognition Arousal/Alertness: Awake/alert Behavior During Therapy: WFL for tasks assessed/performed Overall Cognitive Status: History of cognitive impairments - at baseline                       Memory: Decreased short-term memory, Decreased recall of precautions Following Commands: Follows one step commands consistently Safety/Judgement: Decreased awareness of deficits   Problem Solving: Requires verbal cues, Requires tactile cues       General Comments  Hr to 123 with mobility    Exercises     Shoulder Instructions      Home Living Family/patient expects to be discharged to:: Private residence Living Arrangements: Alone Available Help at Discharge: Family;Available PRN/intermittently Type of Home: House Home Access: Stairs to enter [employer] of Steps: 1 Entrance Stairs-Rails: Can reach both Home Layout: One level     Bathroom Shower/Tub: Chief Strategy Officer: Standard Bathroom Accessibility: Yes How Accessible: Accessible via walker Home Equipment: Rolling Walker (2 wheels);Grab bars - tub/shower;Shower seat;Cane - single point   Additional Comments: pt provides conflicting reports from prior admission. 1 step vs 2, tub shower vs walk in      Prior Functioning/Environment Prior Level of Function : Independent/Modified Independent;Driving             Mobility Comments: ambulation dysfunction recently, fell prior to this admission ADLs Comments: Patient stating children can assist as needed.  Patient still drives locally, unclear regarding medication management and bill payment.        OT Problem List:  Decreased strength;Decreased range of motion;Decreased activity tolerance;Impaired balance (sitting and/or standing);Decreased cognition;Decreased safety awareness;Decreased knowledge of use of DME or AE;Impaired UE functional use      OT Treatment/Interventions: Self-care/ADL training;Therapeutic exercise;Energy conservation;Therapeutic activities;Cognitive remediation/compensation;Visual/perceptual remediation/compensation;Patient/family education;Balance training    OT Goals(Current goals can be found in the care plan section) Acute Rehab OT Goals Patient Stated Goal: Return home OT Goal Formulation: With patient Time For Goal Achievement: 05/30/22 ADL Goals Pt Will Perform Grooming: with modified independence;standing Pt Will Perform Upper Body Dressing: with modified independence;sitting Pt Will Perform Lower Body Dressing: with modified independence;sit to/from stand Pt Will Transfer to Toilet: with modified independence;ambulating;regular height toilet  OT Frequency: Min 2X/week    Co-evaluation              AM-PAC OT "6 Clicks" Daily Activity     Outcome Measure Help from another person eating meals?: None Help from another person taking care of personal grooming?: A Little Help from another person toileting, which includes using toliet, bedpan, or urinal?: A Little Help from another person bathing (including washing, rinsing, drying)?: A Little Help from another person to put on and taking off regular upper body clothing?: None Help from another person to put on and taking off regular lower body clothing?: A Little 6 Click Score: 20   End of Session Nurse Communication: Mobility status  Activity Tolerance: Patient tolerated treatment well Patient left: in chair;with call  bell/phone within reach;with chair alarm set  OT Visit Diagnosis: Unsteadiness on feet (R26.81);Other abnormalities of gait and mobility (R26.89);Muscle weakness (generalized) (M62.81);Other symptoms  and signs involving cognitive function                Time: 1043-1105 OT Time Calculation (min): 22 min Charges:  OT General Charges $OT Visit: 1 Visit OT Evaluation $OT Eval Moderate Complexity: 1 Mod  05/16/2022  RP, OTR/L  Acute Rehabilitation Services  Office:  [phone]   Suzanna Obey 05/16/2022, 11:46 AM

## 2022-05-16 NOTE — Progress Notes (Signed)
Initial Nutrition Assessment  DOCUMENTATION CODES:   Severe malnutrition in context of social or environmental circumstances, Underweight  INTERVENTION:  Liberalize diet to regular, encourage PO intake Ensure Enlive po TID, each supplement provides 350 kcal and 20 grams of protein. MVI with minerals Magic cup TID with meals, each supplement provides 290 kcal and 9 grams of protein  NUTRITION DIAGNOSIS:   Severe Malnutrition (in the context of social/environmental circumstances) related to poor appetite as evidenced by severe fat depletion, severe muscle depletion, percent weight loss (27% x 6 months).  GOAL:   Patient will meet greater than or equal to 90% of their needs  MONITOR:   PO intake, Weight trends, Supplement acceptance, Labs  REASON FOR ASSESSMENT:   Consult Poor PO  ASSESSMENT:   Pt with hx of CAD, HTN, HLD, COPD, and recent pacemaker placement presented to ED with confusion and falls at home.  Pt recently admitted with similar workup and PT recommended SNF, pt declined last admission.   Pt in bedside chair during time of assessment. States that appetite is pretty good. Has a piece of pizza he is going to eat for dinner, doesn't recall what he had for lunch.   Pt endorses weight loss recently, states that he was eating less due to having no appetite. 27% weight loss noted x 6 months, which is severe.   Does not drink ensure at home but does like them and is agreeable to them while admitted.  Severe muscle and fat depletions present on exam.   Average Meal Intake: 7/19: 100% intake x 1 recorded meals  Nutritionally Relevant Medications: Scheduled Meds:  ezetimibe  10 mg Oral Daily   feeding supplement  237 mL Oral BID BM   mirtazapine  7.5 mg Oral QHS   rosuvastatin  40 mg Oral Daily   thiamine injection  100 mg Intravenous Daily   Labs Reviewed: K 3.0  NUTRITION - FOCUSED PHYSICAL EXAM:  Flowsheet Row Most Recent Value  Orbital Region Severe  depletion  Upper Arm Region Severe depletion  Thoracic and Lumbar Region Severe depletion  Buccal Region Severe depletion  Temple Region Severe depletion  Clavicle Bone Region Severe depletion  Clavicle and Acromion Bone Region Severe depletion  Scapular Bone Region Severe depletion  Dorsal Hand Moderate depletion  Patellar Region Severe depletion  Anterior Thigh Region Severe depletion  Posterior Calf Region Severe depletion  Edema (RD Assessment) None  Hair Reviewed  Eyes Reviewed  Mouth Reviewed  Skin Reviewed  [dry flakey]  Nails Reviewed   Diet Order:   Diet Order             Diet regular Room service appropriate? Yes; Fluid consistency: Thin  Diet effective now                   EDUCATION NEEDS:   No education needs have been identified at this time  Skin:  Skin Assessment: Reviewed RN Assessment  Last BM:  unsure  Height:   Ht Readings from Last 1 Encounters:  05/15/22 5\' 9"  (1.753 m)    Weight:   Wt Readings from Last 1 Encounters:  05/16/22 47.2 kg    Ideal Body Weight:  72.7 kg  BMI:  Body mass index is 15.36 kg/m.  Estimated Nutritional Needs:   Kcal:  1700-1900 kcal/d  Protein:  85-100 g/d  Fluid:  >1.8L/d    05/18/22, RD, LDN Clinical Dietitian RD pager # available in [facility]  After hours/weekend pager # available in  [facility]

## 2022-05-16 NOTE — Progress Notes (Signed)
Met patient's son at bedside. Son is concerned about the hematuria. (Per MD notes chronic hematuria). Son also mentioned that patient had significant weight lost. Informed Devin Hope, MD about the hematuria with blood clots with orders made and carried out.

## 2022-05-16 NOTE — Progress Notes (Signed)
PROGRESS NOTE    [name]  [id number] DOB: 11/11/1940 DOA: 05/15/2022 PCP: Janith Lima, MD    Brief Narrative:   [name] is a 81 y.o. male with medical history significant of CAD; HTN; HLD; and recent pacemaker placement presenting with near syncope.   He was last admitted from 7/1-7 for CHB and had pacemaker placement.  He was recommended for [facility] PT/OT.  The family called the PCP the day of dc to ask for referral back to rehab.  He has had 2 prior hospitalizations with SNF stays and the family desired SNF rehab again.  He saw his PCP on 7/17 for hospitalization f/u and was referred to hospice.  Clinic SW spoke with the family yesterday and was given the option to hire private duty caregivers or to be placed.  Daughter called the PCP today to report that "the ED will be keeping him over night to get him into rehab/24hr care."   His son and daughter were present and provided most of the history.  The patient appeared to have some confusion.  They report that they wanted him placed with his last admission but he refused.   Instead, PT recommended home services and he went home with his son.  He did well and seemed to have slow improvement for the first few days, but still required significant assistance with mobility and ADLs.  However, he began to decline again the last few days.  He fell yesterday in the middle of the night and was found behind his recliner, covered in stool.  He fell again this AM and appeared to pass out on the commode.    Assessment and Plan: Syncope -Patient with recent admission for CHB now presenting with syncope -Most likely micturitional/vasovagal syncope but he has had some recent increase in tremors and seizure is a consideration -EEG normal -Cardiac syncope is a lesser consideration, given his recent pacemaker placement -Sedating medications may also be contributing; will hold fentanyl transdermal -Orthostatic vital signs positive this AM -TED  hose-- repeat in AM -Neuro checks  -d/c asa  Hypokalemia -replete  Painless hematuria -send cytology -may need urology consult -check PVR -CT scan done: The adrenal glands are unremarkable. Multiple the the nonobstructing left renal calculi measure up to 5 mm in the inferior pole of the left kidney. Linear calcification in the right kidney may represent vascular calcification or nonobstructing calculi. There is no hydronephrosis on either side. There is a 4.5 cm left renal inferior pole cyst. Several additional bilateral hypodense lesions are not characterized on this CT. The this can be better evaluated with ultrasound on a nonemergent/outpatient basis. The urinary bladder is grossly unremarkable. Small pocket of air within the bladder may have been introduced by recent instrumentation. Correlation with urinalysis recommended to exclude cystitis.   Complete heart block -Present during last hospitalization -s/p pacer -Pacer interrogation does not show obvious concern   Failure to thrive -During his prior hospitalization, family reports that he was recommended for SNF rehab but refused -They further report that he is now willing -He already has a qualifying inpatient stay -significant weight losee -PT/OT/ST (cognitive) evaluations requested -TOC consult requested; family appears to prefer CIR if possible -Palliative care team consult   Mild cognitive impairment -Suspect underlying dementia with intermittent delirium -Likely unmasked by recent changes in environment -Fentanyl patch may also be contributing; will hold and give prn low-dose oxy if needed -Continue Remeron   HLD -Continue Crestor, Zetia   HTN -hold  HCTZ for hypokalemia   Tobacco dependence with COPD -Family reports none since last hospitalization -Will order nicotine patch -Continue albuterol prn   Chronic resp failure -wears O2 PRN at home  Pressure Injury 05/15/22 Coccyx Right;Left Stage 2 -   Partial thickness loss of dermis presenting as a shallow open injury with a red, pink wound bed without slough. small open area x2. (Active)  05/15/22 1800  Location: Coccyx  Location Orientation: Right;Left  Staging: Stage 2 -  Partial thickness loss of dermis presenting as a shallow open injury with a red, pink wound bed without slough.  Wound Description (Comments): small open area x2.  Present on Admission: Yes     Pressure Injury 05/15/22 Heel Right Stage 1 -  Intact skin with non-blanchable redness of a localized area usually over a bony prominence. boggy heel. unblanchable. (Active)  05/15/22 1800  Location: Heel  Location Orientation: Right  Staging: Stage 1 -  Intact skin with non-blanchable redness of a localized area usually over a bony prominence.  Wound Description (Comments): boggy heel. unblanchable.  Present on Admission: Yes     Pressure Injury 05/15/22 Coccyx Right;Left Stage 2 -  Partial thickness loss of dermis presenting as a shallow open injury with a red, pink wound bed without slough. small open area x2. (Active)  05/15/22 1800  Location: Coccyx  Location Orientation: Right;Left  Staging: Stage 2 -  Partial thickness loss of dermis presenting as a shallow open injury with a red, pink wound bed without slough.  Wound Description (Comments): small open area x2.  Present on Admission: Yes    Low thiamine -IV replacement   DVT prophylaxis: Place and maintain sequential compression device Start: 05/16/22 0003 Place and maintain sequential compression device Start: 05/16/22 0003    Code Status: DNR Family Communication:   Disposition Plan:  Level of care: Telemetry Cardiac Status is: Observation The patient will require care spanning > 2 midnights and should be moved to inpatient because: needs SNF placement    Consultants:  none   Subjective: No complaints  Objective: Vitals:   05/15/22 1645 05/15/22 1731 05/15/22 2013 05/16/22 0510  BP: 122/66 (!)  153/66 123/68 131/80  Pulse: 75 86 85 (!) 108  Resp: 15 19 19    Temp:  98 F (36.7 C) 97.8 F (36.6 C)   TempSrc:  Oral Oral   SpO2: 100% 100% 100% 91%  Weight:  50.1 kg  47.2 kg  Height:  5\' 9"  (1.753 m)      Intake/Output Summary (Last 24 hours) at 05/16/2022 1155 Last data filed at 05/16/2022 0600 Gross per 24 hour  Intake 1250 ml  Output 750 ml  Net 500 ml   Filed Weights   05/15/22 1731 05/16/22 0510  Weight: 50.1 kg 47.2 kg    Examination:   General: Appearance:    Thin male in no acute distress     Lungs:     respirations unlabored  Heart:    Tachycardic.    MS:   All extremities are intact.    Neurologic:   Awake, alert       Data Reviewed: I have personally reviewed following labs and imaging studies  CBC: Recent Labs  Lab 05/15/22 0742 05/16/22 0211  WBC 13.8* 7.8  NEUTROABS 10.7*  --   HGB 12.5* 10.5*  HCT 37.1* 31.8*  MCV 95.4 95.2  PLT 348 297   Basic Metabolic Panel: Recent Labs  Lab 05/15/22 0742 05/16/22 0211  NA 139 139  K 2.9* 3.0*  CL 98 101  CO2 30 30  GLUCOSE 122* 90  BUN 20 20  CREATININE 0.98 0.93  CALCIUM 9.3 8.9  MG 1.8 1.8   GFR: Estimated Creatinine Clearance: 40.9 mL/min (by C-G formula based on SCr of 0.93 mg/dL). Liver Function Tests: Recent Labs  Lab 05/15/22 0742  AST 28  ALT 20  ALKPHOS 72  BILITOT 0.6  PROT 6.1*  ALBUMIN 2.8*   No results for input(s): "LIPASE", "AMYLASE" in the last 168 hours. No results for input(s): "AMMONIA" in the last 168 hours. Coagulation Profile: No results for input(s): "INR", "PROTIME" in the last 168 hours. Cardiac Enzymes: No results for input(s): "CKTOTAL", "CKMB", "CKMBINDEX", "TROPONINI" in the last 168 hours. BNP (last 3 results) Recent Labs    10/04/21 0916  PROBNP 27.0   HbA1C: Recent Labs    05/16/22 0834  HGBA1C 5.5   CBG: Recent Labs  Lab 05/15/22 0741  GLUCAP 127*   Lipid Profile: No results for input(s): "CHOL", "HDL", "LDLCALC", "TRIG",  "CHOLHDL", "LDLDIRECT" in the last 72 hours. Thyroid Function Tests: No results for input(s): "TSH", "T4TOTAL", "FREET4", "T3FREE", "THYROIDAB" in the last 72 hours. Anemia Panel: No results for input(s): "VITAMINB12", "FOLATE", "FERRITIN", "TIBC", "IRON", "RETICCTPCT" in the last 72 hours. Sepsis Labs: No results for input(s): "PROCALCITON", "LATICACIDVEN" in the last 168 hours.  Recent Results (from the past 240 hour(s))  Resp Panel by RT-PCR (Flu A&B, Covid) Anterior Nasal Swab     Status: None   Collection Time: 05/15/22  2:53 PM   Specimen: Anterior Nasal Swab  Result Value Ref Range Status   SARS Coronavirus 2 by RT PCR NEGATIVE NEGATIVE Final    Comment: (NOTE) SARS-CoV-2 target nucleic acids are NOT DETECTED.  The SARS-CoV-2 RNA is generally detectable in upper respiratory specimens during the acute phase of infection. The lowest concentration of SARS-CoV-2 viral copies this assay can detect is 138 copies/mL. A negative result does not preclude SARS-Cov-2 infection and should not be used as the sole basis for treatment or other patient management decisions. A negative result may occur with  improper specimen collection/handling, submission of specimen other than nasopharyngeal swab, presence of viral mutation(s) within the areas targeted by this assay, and inadequate number of viral copies(<138 copies/mL). A negative result must be combined with clinical observations, patient history, and epidemiological information. The expected result is Negative.  Fact Sheet for Patients:  BloggerCourse.com  Fact Sheet for Healthcare Providers:  SeriousBroker.it  This test is no t yet approved or cleared by the [place] FDA and  has been authorized for detection and/or diagnosis of SARS-CoV-2 by FDA under an Emergency Use Authorization (EUA). This EUA will remain  in effect (meaning this test can be used) for the duration of  the COVID-19 declaration under Section 564(b)(1) of the Act, 21 U.S.C.section 360bbb-3(b)(1), unless the authorization is terminated  or revoked sooner.       Influenza A by PCR NEGATIVE NEGATIVE Final   Influenza B by PCR NEGATIVE NEGATIVE Final    Comment: (NOTE) The Xpert Xpress SARS-CoV-2/FLU/RSV plus assay is intended as an aid in the diagnosis of influenza from Nasopharyngeal swab specimens and should not be used as a sole basis for treatment. Nasal washings and aspirates are unacceptable for Xpert Xpress SARS-CoV-2/FLU/RSV testing.  Fact Sheet for Patients: BloggerCourse.com  Fact Sheet for Healthcare Providers: SeriousBroker.it  This test is not yet approved or cleared by the [place] FDA and has been authorized for detection and/or  diagnosis of SARS-CoV-2 by FDA under an Emergency Use Authorization (EUA). This EUA will remain in effect (meaning this test can be used) for the duration of the COVID-19 declaration under Section 564(b)(1) of the Act, 21 U.S.C. section 360bbb-3(b)(1), unless the authorization is terminated or revoked.  Performed at [facility], [place] [address] [place],  [address]          Radiology Studies: EEG adult  Result Date: 05-27-22 Lora Havens, MD     2022-05-27  9:57 AM Patient Name: [name] MRN: [id number] Epilepsy Attending: Lora Havens Referring Physician/Provider: Karmen Bongo, MD Date: 27-May-2022 Duration: 25.19 mins Patient history: 81 year old male presented with syncope.  EEG to evaluate for seizure. Level of alertness: Awake, asleep AEDs during EEG study: None Technical aspects: This EEG study was done with scalp electrodes positioned according to the 10-20 International system of electrode placement. Electrical activity was acquired at a sampling rate of 500Hz  and reviewed with a high frequency filter of 70Hz  and a low frequency filter of 1Hz . EEG  data were recorded continuously and digitally stored. Description: The posterior dominant rhythm consists of 9 Hz activity of moderate voltage (25-35 uV) seen predominantly in posterior head regions, symmetric and reactive to eye opening and eye closing. Sleep was characterized by vertex waves, sleep spindles (12 to 14 Hz), maximal frontocentral region.  Hyperventilation and photic stimulation were not performed.   IMPRESSION: This study is within normal limits. No seizures or epileptiform discharges were seen throughout the recording. Lora Havens   CT RENAL STONE STUDY  Result Date: May 27, 2022 CLINICAL DATA:  Flank pain.  Concern for kidney stone. EXAM: CT ABDOMEN AND PELVIS WITHOUT CONTRAST TECHNIQUE: Multidetector CT imaging of the abdomen and pelvis was performed following the standard protocol without IV contrast. RADIATION DOSE REDUCTION: This exam was performed according to the departmental dose-optimization program which includes automated exposure control, adjustment of the mA and/or kV according to patient size and/or use of iterative reconstruction technique. COMPARISON:  None Available. FINDINGS: Evaluation of this exam is limited in the absence of intravenous contrast as well as due to streak artifact caused by patient's arms. Lower chest: The visualized lung bases are clear. There is coronary vascular calcification and cardiac pacemaker wire. No intra-abdominal free air.  Small free fluid within the pelvis. Hepatobiliary: The liver is unremarkable. No biliary dilatation. The gallbladder is unremarkable Pancreas: Small scattered pancreatic calcification, likely sequela of chronic pancreatitis. No active inflammatory changes. No dilatation of the main pancreatic duct or gland atrophy Spleen: Normal in size without focal abnormality. Adrenals/Urinary Tract: The adrenal glands are unremarkable. Multiple the the nonobstructing left renal calculi measure up to 5 mm in the inferior pole of the left  kidney. Linear calcification in the right kidney may represent vascular calcification or nonobstructing calculi. There is no hydronephrosis on either side. There is a 4.5 cm left renal inferior pole cyst. Several additional bilateral hypodense lesions are not characterized on this CT. The this can be better evaluated with ultrasound on a nonemergent/outpatient basis. The urinary bladder is grossly unremarkable. Small pocket of air within the bladder may have been introduced by recent instrumentation. Correlation with urinalysis recommended to exclude cystitis. Stomach/Bowel: There is severe sigmoid diverticulosis without active inflammatory changes. There is moderate stool throughout the colon. There is no bowel obstruction. The appendix is normal as visualized. Vascular/Lymphatic: Advanced aortoiliac atherosclerotic disease. The IVC is unremarkable. No portal venous gas. There is no adenopathy. Reproductive: The prostate and  seminal vesicles are grossly unremarkable. No pelvic mass. Other: There is diffuse subcutaneous edema.  No fluid collection. Musculoskeletal: Osteopenia with scoliosis and degenerative changes of the spine. The there is age indeterminate compression fracture of superior endplate of 624THL with approximately the 30% loss of vertebral body height. Correlation with clinical exam and point tenderness recommended. IMPRESSION: 1. Nonobstructing left renal calculi. No hydronephrosis. 2. Vascular calcification versus nonobstructing right renal stones. No hydronephrosis. 3. Severe sigmoid diverticulosis. No bowel obstruction. Normal appendix. 4. Age indeterminate compression fracture of superior endplate of 624THL. Correlation with clinical exam and point tenderness recommended. 5. Aortic Atherosclerosis (ICD10-I70.0). Electronically Signed   By: Anner Crete M.D.   On: 05/16/2022 03:39   CT Cervical Spine Wo Contrast  Result Date: 05/15/2022 CLINICAL DATA:  Neck trauma (Age >= 65y) EXAM: CT CERVICAL  SPINE WITHOUT CONTRAST TECHNIQUE: Multidetector CT imaging of the cervical spine was performed without intravenous contrast. Multiplanar CT image reconstructions were also generated. RADIATION DOSE REDUCTION: This exam was performed according to the departmental dose-optimization program which includes automated exposure control, adjustment of the mA and/or kV according to patient size and/or use of iterative reconstruction technique. COMPARISON:  None Available. FINDINGS: Alignment: Degenerative anterolisthesis at C7-T1 greater than C3-C4. Skull base and vertebrae: Degenerative plate irregularity. No acute fracture. Soft tissues and spinal canal: No prevertebral fluid or swelling. No visible canal hematoma. Disc levels: Multilevel degenerative changes are present including disc space narrowing, endplate osteophytes, and facet and uncovertebral hypertrophy. Upper chest: Emphysema. Other: Calcified plaque at the common carotid bifurcations. IMPRESSION: No acute cervical spine fracture. Electronically Signed   By: Macy Mis M.D.   On: 05/15/2022 08:51   CT HEAD WO CONTRAST (5MM)  Result Date: 05/15/2022 CLINICAL DATA:  Head trauma, minor (Age >= 65y) EXAM: CT HEAD WITHOUT CONTRAST TECHNIQUE: Contiguous axial images were obtained from the base of the skull through the vertex without intravenous contrast. RADIATION DOSE REDUCTION: This exam was performed according to the departmental dose-optimization program which includes automated exposure control, adjustment of the mA and/or kV according to patient size and/or use of iterative reconstruction technique. COMPARISON:  04/27/2022 FINDINGS: Brain: There is no acute intracranial hemorrhage, mass effect, or edema. Gray-white differentiation is preserved. There is no extra-axial fluid collection. Ventricles and sulci are stable in size and configuration. Patchy hypoattenuation in the supratentorial white matter probably reflects stable chronic microvascular  ischemic changes. Vascular: There is atherosclerotic calcification at the skull base. Skull: Calvarium is unremarkable. Sinuses/Orbits: No acute finding. Other: None. IMPRESSION: No evidence of acute intracranial injury. Electronically Signed   By: Macy Mis M.D.   On: 05/15/2022 08:43   DG Pelvis 1-2 Views  Result Date: 05/15/2022 CLINICAL DATA:  Near syncope and fall EXAM: PELVIS - 1-2 VIEW COMPARISON:  None Available. FINDINGS: There is no evidence of pelvic fracture or diastasis. No pelvic bone lesions are seen. IMPRESSION: Negative. Electronically Signed   By: Frazier Richards M.D.   On: 05/15/2022 08:20   DG Chest Portable 1 View  Result Date: 05/15/2022 CLINICAL DATA:  Near syncope EXAM: PORTABLE CHEST 1 VIEW COMPARISON:  05/03/2022 FINDINGS: Hyperinflated lungs. No new consolidation or edema. No pleural effusion or pneumothorax. Stable cardiomediastinal contours. Left chest wall dual lead pacemaker. IMPRESSION: No acute process in the chest. Electronically Signed   By: Macy Mis M.D.   On: 05/15/2022 08:16        Scheduled Meds:  aspirin EC  81 mg Oral Daily   ezetimibe  10 mg  Oral Daily   feeding supplement  237 mL Oral BID BM   mirtazapine  7.5 mg Oral QHS   nicotine  14 mg Transdermal Daily   potassium chloride  40 mEq Oral Once   rosuvastatin  40 mg Oral Daily   sodium chloride flush  3 mL Intravenous Q12H   Continuous Infusions:  magnesium sulfate bolus IVPB       LOS: 0 days    Time spent: 45 minutes spent on chart review, discussion with nursing staff, consultants, updating family and interview/physical exam; more than 50% of that time was spent in counseling and/or coordination of care.    Geradine Girt, DO [facility] Available via Epic secure chat 7am-7pm After these hours, please refer to coverage provider listed on [email or web address] 05/16/2022, 11:55 AM

## 2022-05-16 NOTE — Plan of Care (Signed)

## 2022-05-16 NOTE — Evaluation (Addendum)
Physical Therapy Evaluation Patient Details Name: [name] MRN: [id number] DOB: 06-07-1941 Today's Date: 05/16/2022  History of Present Illness  81 y.o. male presents to [facility] hospital on 05/14/2022 with possible syncopal episode as well as a fall at home. Pt requiring assistance with ADLs since PPM placement in early July 2023. PMH includes CAD, HTN, HLF, PPM placement, MI.  Clinical Impression  Pt presents with deficits in strength, balance, activity tolerance, and cognition. Pt tolerates therapy well today, ambulating limited community distances with and without an AD. Without the AD, pt has multiple LOB but reports no dizziness. Pt also negotiates 3 stairs using rails but no AD, also with LOB x2. Pt reports multiple falls over the past 6 months, stating they all occurred when he wasn't using his walker. PT provides education for use of RW, although notes from previous admission suggest pt has difficulty remembering to utilize AD at home. Pt would benefit from continued therapy to improve strength, activity tolerance, and balance for improved safety and progression of independence. PT recommends SNF placement upon discharge because pt is at an increased risk of falls, has impaired cognition and can't remember to use AD, and his family cannot provide frequent enough supervision to ensure safety.  Due to pt's cognitive deficits and availability of caregivers, transition to long-term placement may need to be considered.   Recommendations for follow up therapy are one component of a multi-disciplinary discharge planning process, led by the attending physician.  Recommendations may be updated based on patient status, additional functional criteria and insurance authorization.  Follow Up Recommendations Skilled nursing-short term rehab (<3 hours/day) Can patient physically be transported by private vehicle: Yes    Assistance Recommended at Discharge Frequent or constant Supervision/Assistance   Patient can return home with the following  A little help with walking and/or transfers;A little help with bathing/dressing/bathroom;Assistance with cooking/housework;Direct supervision/assist for medications management;Direct supervision/assist for financial management;Assist for transportation;Help with stairs or ramp for entrance    Equipment Recommendations None recommended by PT (Pt is poor historian and reports he has RW at home; need to confirm with family)  Recommendations for Other Services       Functional Status Assessment Patient has had a recent decline in their functional status and demonstrates the ability to make significant improvements in function in a reasonable and predictable amount of time.     Precautions / Restrictions Precautions Precautions: Fall Restrictions Weight Bearing Restrictions: No      Mobility  Bed Mobility Overal bed mobility: Modified Independent Bed Mobility: Supine to Sit     Supine to sit: Modified independent (Device/Increase time)          Transfers Overall transfer level: Needs assistance Equipment used: Rolling walker (2 wheels) Transfers: Sit to/from Stand Sit to Stand: Supervision           General transfer comment: PT gives verbal cues for hand placement    Ambulation/Gait Ambulation/Gait assistance: Min assist, Min guard (Min guard with RW; MinA with no AD) Gait Distance (Feet): 400 Feet Assistive device: Rolling walker (2 wheels), None Gait Pattern/deviations: Step-through pattern, Decreased stride length Gait velocity: reduced Gait velocity interpretation: 1.31 - 2.62 ft/sec, indicative of limited community ambulator   General Gait Details: Pt initiates ambulation with RW but reports he doesn't need it; transitions to ambulation without AD and has multiple LOB, but says he feels at balanced and doesn't report any dizziness  Stairs Stairs: Yes Stairs assistance: Min assist Stair Management: One rail Right,  Forwards,  Sideways (Forwards ascending; sideways descending) Number of Stairs: 3 General stair comments: Pt has LOB x2 on stairs  Wheelchair Mobility    Modified Rankin (Stroke Patients Only)       Balance Overall balance assessment: Needs assistance Sitting-balance support: No upper extremity supported, Feet supported Sitting balance-Leahy Scale: Fair     Standing balance support: During functional activity, Reliant on assistive device for balance, Bilateral upper extremity supported, No upper extremity supported (Pt with multiple LOB without UE support) Standing balance-Leahy Scale: Poor                               Pertinent Vitals/Pain      Home Living Family/patient expects to be discharged to:: Private residence Living Arrangements: Alone Available Help at Discharge: Family;Available PRN/intermittently Type of Home: House Home Access: Stairs to enter Entrance Stairs-Rails: Can reach both Entrance Stairs-Number of Steps: 1   Home Layout: One level Home Equipment: Agricultural consultant (2 wheels);Grab bars - tub/shower;Shower seat;Cane - single point Additional Comments: pt provides conflicting reports from prior admission. 1 step vs 2, tub shower vs walk in    Prior Function Prior Level of Function : Independent/Modified Independent;Driving             Mobility Comments: ambulation dysfunction recently, fell prior to this admission ADLs Comments: Children pick up most of his groceries. He goes to the store mainly for cigarettes. Still indpendent with ADL's but has not been completing them regularly. Per chart review pt requiring assistance for ADLs since PPM placement     Hand Dominance   Dominant Hand: Right    Extremity/Trunk Assessment   Upper Extremity Assessment Upper Extremity Assessment: Generalized weakness    Lower Extremity Assessment Lower Extremity Assessment: Generalized weakness    Cervical / Trunk Assessment Cervical / Trunk  Assessment: Kyphotic  Communication   Communication: HOH  Cognition                           Current Attention Level: Sustained Memory: Decreased short-term memory, Decreased recall of precautions Following Commands: Follows one step commands consistently Safety/Judgement: Decreased awareness of safety, Decreased awareness of deficits (Pt reports he feels stable and balanced during ambulation despite multiple LOB) Awareness: Intellectual Problem Solving: Slow processing, Difficulty sequencing, Requires verbal cues General Comments: Pt reports multiple falls within the past 6 months, says they have all been while ambulating without his RW --> decreased awareness of safety and precautions        General Comments General comments (skin integrity, edema, etc.): Pre-ambulation BP: 144/76; Post-ambulation BP: 128/70    Exercises     Assessment/Plan    PT Assessment Patient needs continued PT services  PT Problem List Decreased strength;Decreased activity tolerance;Decreased balance;Decreased mobility;Decreased coordination;Decreased cognition;Decreased knowledge of use of DME;Decreased safety awareness       PT Treatment Interventions DME instruction;Gait training;Stair training;Functional mobility training;Therapeutic activities;Therapeutic exercise;Balance training;Cognitive remediation;Patient/family education    PT Goals (Current goals can be found in the Care Plan section)  Acute Rehab PT Goals Patient Stated Goal: "stay alive" PT Goal Formulation: With patient Time For Goal Achievement: 05/30/22 Potential to Achieve Goals: Good    Frequency Min 2X/week     Co-evaluation               AM-PAC PT "6 Clicks" Mobility  Outcome Measure Help needed turning from your back to your side while in a  flat bed without using bedrails?: None Help needed moving from lying on your back to sitting on the side of a flat bed without using bedrails?: None Help needed moving  to and from a bed to a chair (including a wheelchair)?: A Little Help needed standing up from a chair using your arms (e.g., wheelchair or bedside chair)?: A Little Help needed to walk in hospital room?: A Little Help needed climbing 3-5 steps with a railing? : A Little 6 Click Score: 20    End of Session Equipment Utilized During Treatment: Gait belt Activity Tolerance: Patient tolerated treatment well Patient left: in chair;with call bell/phone within reach;with chair alarm set Nurse Communication: Mobility status PT Visit Diagnosis: Unsteadiness on feet (R26.81);Other abnormalities of gait and mobility (R26.89);Muscle weakness (generalized) (M62.81);History of falling (Z91.81);Difficulty in walking, not elsewhere classified (R26.2)    Time: 9937-1696 PT Time Calculation (min) (ACUTE ONLY): 28 min   Charges:   PT Evaluation $PT Eval Low Complexity: 1 Low          Murlean Hark, SPT Acute Rehabilitation Office #: [phone]   Murlean Hark 05/16/2022, 9:50 AM

## 2022-05-16 NOTE — Progress Notes (Addendum)
Informed Toniann Fail, MD regarding bladder scan result approximately 300 ml post void. Patient denied any pain or fullness on bladder area. No distention noted. Urine is orange, clear with blood clots fragments.   Tried to do in and out per order. Unsuccessful due to resistance. Noted blood tinged on the tip of the catheter. Informed Toniann Fail, MD and try to secure order to use coude. Per MD, do bladder scan after 4 hours and if still with retention, may use coude.

## 2022-05-16 NOTE — Procedures (Signed)
Patient Name: DENISE BRAMBLETT  MRN: [id number]  Epilepsy Attending: Charlsie Quest  Referring Physician/Provider: Jonah Blue, MD Date: 05/16/2022 Duration: 25.19 mins  Patient history: 81 year old male presented with syncope.  EEG to evaluate for seizure.  Level of alertness: Awake, asleep  AEDs during EEG study: None  Technical aspects: This EEG study was done with scalp electrodes positioned according to the 10-20 International system of electrode placement. Electrical activity was acquired at a sampling rate of 500Hz  and reviewed with a high frequency filter of 70Hz  and a low frequency filter of 1Hz . EEG data were recorded continuously and digitally stored.   Description: The posterior dominant rhythm consists of 9 Hz activity of moderate voltage (25-35 uV) seen predominantly in posterior head regions, symmetric and reactive to eye opening and eye closing. Sleep was characterized by vertex waves, sleep spindles (12 to 14 Hz), maximal frontocentral region.  Hyperventilation and photic stimulation were not performed.     IMPRESSION: This study is within normal limits. No seizures or epileptiform discharges were seen throughout the recording.  Desyre Calma 

## 2022-05-17 DIAGNOSIS — R55 Syncope and collapse: Secondary | ICD-10-CM | POA: Diagnosis not present

## 2022-05-17 LAB — CBC
HCT: 33.9 % — ABNORMAL LOW (ref 39.0–52.0)
Hemoglobin: 11.1 g/dL — ABNORMAL LOW (ref 13.0–17.0)
MCH: 31.4 pg (ref 26.0–34.0)
MCHC: 32.7 g/dL (ref 30.0–36.0)
MCV: 95.8 fL (ref 80.0–100.0)
Platelets: 292 10*3/uL (ref 150–400)
RBC: 3.54 MIL/uL — ABNORMAL LOW (ref 4.22–5.81)
RDW: 15 % (ref 11.5–15.5)
WBC: 10 10*3/uL (ref 4.0–10.5)
nRBC: 0 % (ref 0.0–0.2)

## 2022-05-17 LAB — BASIC METABOLIC PANEL
Anion gap: 8 (ref 5–15)
BUN: 21 mg/dL (ref 8–23)
CO2: 28 mmol/L (ref 22–32)
Calcium: 9.2 mg/dL (ref 8.9–10.3)
Chloride: 102 mmol/L (ref 98–111)
Creatinine, Ser: 1.04 mg/dL (ref 0.61–1.24)
GFR, Estimated: >60 mL/min (ref >60)
Glucose, Bld: 127 mg/dL — ABNORMAL HIGH (ref 70–99)
Potassium: 3.4 mmol/L — ABNORMAL LOW (ref 3.5–5.1)
Sodium: 138 mmol/L (ref 135–145)

## 2022-05-17 LAB — CYTOLOGY - NON PAP

## 2022-05-17 MED ORDER — THIAMINE HCL 100 MG PO TABS
100.0000 mg | ORAL_TABLET | Freq: Every day | ORAL | Status: DC
  Administered 2022-05-17 – 2022-05-20 (×4): 100 mg via ORAL
  Filled 2022-05-17 (×4): qty 1

## 2022-05-17 MED ORDER — POTASSIUM CHLORIDE CRYS ER 20 MEQ PO TBCR
40.0000 meq | EXTENDED_RELEASE_TABLET | Freq: Once | ORAL | Status: AC
Start: 2022-05-17 — End: 2022-05-17
  Administered 2022-05-17: 40 meq via ORAL
  Filled 2022-05-17: qty 2

## 2022-05-17 MED ORDER — SODIUM CHLORIDE 0.9 % IV SOLN: INTRAVENOUS | Status: AC

## 2022-05-17 NOTE — Progress Notes (Signed)
PROGRESS NOTE    [name]  OJJ:[id number] DOB: Feb 03, 1941 DOA: 05/15/2022 PCP: Etta Grandchild, MD    Brief Narrative:   [name] is a 81 y.o. male with medical history significant of CAD; HTN; HLD; and recent pacemaker placement presenting with near syncope.   He was last admitted from 7/1-7 for CHB and had pacemaker placement.  He was recommended for [facility] PT/OT.  The family called the PCP the day of dc to ask for referral back to rehab.  He has had 2 prior hospitalizations with SNF stays and the family desired SNF rehab again.  He saw his PCP on 7/17 for hospitalization f/u and was referred to hospice.  Clinic SW spoke with the family yesterday and was given the option to hire private duty caregivers or to be placed.  Daughter called the PCP today to report that "the ED will be keeping him over night to get him into rehab/24hr care."   His son and daughter were present and provided most of the history.  The patient appeared to have some confusion.  They report that they wanted him placed with his last admission but he refused.   Instead, PT recommended home services and he went home with his son.  He did well and seemed to have slow improvement for the first few days, but still required significant assistance with mobility and ADLs.  However, he began to decline again the last few days.  He fell yesterday in the middle of the night and was found behind his recliner, covered in stool.  He fell again on day of admission and appeared to pass out on the commode.    Assessment and Plan: Syncope -Patient with recent admission for CHB now presenting with syncope -Most likely micturitional/vasovagal syncope but he has had some recent increase in tremors and seizure is a consideration -EEG normal -Cardiac syncope is a lesser consideration, given his recent pacemaker placement -Sedating medications may also be contributing; will hold fentanyl transdermal -Orthostatic vital signs positive but  repeat today negative -TED hose -Neuro checks   Hypokalemia -replete -labs pending this AM  Painless hematuria -sent cytology -may need urology consult vs follow up outpatient  -check PVR -CT scan done: The adrenal glands are unremarkable. Multiple the the nonobstructing left renal calculi measure up to 5 mm in the inferior pole of the left kidney. Linear calcification in the right kidney may represent vascular calcification or nonobstructing calculi. There is no hydronephrosis on either side. There is a 4.5 cm left renal inferior pole cyst. Several additional bilateral hypodense lesions are not characterized on this CT. The this can be better evaluated with ultrasound on a nonemergent/outpatient basis. The urinary bladder is grossly unremarkable. Small pocket of air within the bladder may have been introduced by recent instrumentation. Correlation with urinalysis recommended to exclude cystitis.   Complete heart block -Present during last hospitalization -s/p pacer -Pacer interrogation does not show obvious concern   Failure to thrive -During his prior hospitalization, family reports that he was recommended for SNF rehab but refused -They further report that he is now willing -He already has a qualifying inpatient stay -significant weight loss -PT/OT/ST (cognitive) evaluations requested -TOC consult requested; family appears to prefer CIR if possible -Palliative care team consult- will follow outpatient   Mild cognitive impairment -Suspect underlying dementia with intermittent delirium -Likely unmasked by recent changes in environment -Fentanyl patch may also be contributing; will hold and give prn low-dose oxy if needed -Continue  Remeron   HLD -Continue Crestor, Zetia   HTN -hold HCTZ for hypokalemia and low BP   Tobacco dependence with COPD -Family reports none since last hospitalization -Will order nicotine patch -Continue albuterol prn   Chronic resp  failure -wears O2 PRN at home  Pressure Injury 05/15/22 Coccyx Right;Left Stage 2 -  Partial thickness loss of dermis presenting as a shallow open injury with a red, pink wound bed without slough. small open area x2. (Active)  05/15/22 1800  Location: Coccyx  Location Orientation: Right;Left  Staging: Stage 2 -  Partial thickness loss of dermis presenting as a shallow open injury with a red, pink wound bed without slough.  Wound Description (Comments): small open area x2.  Present on Admission: Yes     Pressure Injury 05/15/22 Heel Right Stage 1 -  Intact skin with non-blanchable redness of a localized area usually over a bony prominence. boggy heel. unblanchable. (Active)  05/15/22 1800  Location: Heel  Location Orientation: Right  Staging: Stage 1 -  Intact skin with non-blanchable redness of a localized area usually over a bony prominence.  Wound Description (Comments): boggy heel. unblanchable.  Present on Admission: Yes     Pressure Injury 05/15/22 Coccyx Right;Left Stage 2 -  Partial thickness loss of dermis presenting as a shallow open injury with a red, pink wound bed without slough. small open area x2. (Active)  05/15/22 1800  Location: Coccyx  Location Orientation: Right;Left  Staging: Stage 2 -  Partial thickness loss of dermis presenting as a shallow open injury with a red, pink wound bed without slough.  Wound Description (Comments): small open area x2.  Present on Admission: Yes    Low thiamine (last hospitalization) -IV replacement   DVT prophylaxis: Place TED hose Start: 05/16/22 1201 Place and maintain sequential compression device Start: 05/16/22 0003 Place and maintain sequential compression device Start: 05/16/22 0003    Code Status: DNR Family Communication: son at bedside (7/20)  Disposition Plan:  Level of care: Telemetry Medical     Consultants:  none   Subjective: Eating breakfast, no SOB, no CP  Objective: Vitals:   05/16/22 2044 05/17/22  0305 05/17/22 0545 05/17/22 0859  BP: (!) 141/77  130/66 (!) 114/57  Pulse: 79  81 89  Resp: 19  19 19   Temp: 98.1 F (36.7 C)  97.8 F (36.6 C) 97.8 F (36.6 C)  TempSrc: Oral  Oral Oral  SpO2: 96%  93%   Weight:  47.3 kg    Height:        Intake/Output Summary (Last 24 hours) at 05/17/2022 0922 Last data filed at 05/17/2022 0500 Gross per 24 hour  Intake 410 ml  Output 1040 ml  Net -630 ml   Filed Weights   05/15/22 1731 05/16/22 0510 05/17/22 0305  Weight: 50.1 kg 47.2 kg 47.3 kg    Examination:   General: Appearance:    Thin male in no acute distress     Lungs:      respirations unlabored  Heart:    Normal heart rate.   MS:   All extremities are intact.   Neurologic:   Awake, alert, pleasant and cooperative       Data Reviewed: I have personally reviewed following labs and imaging studies  CBC: Recent Labs  Lab 05/15/22 0742 05/16/22 0211  WBC 13.8* 7.8  NEUTROABS 10.7*  --   HGB 12.5* 10.5*  HCT 37.1* 31.8*  MCV 95.4 95.2  PLT 348 297   Basic  Metabolic Panel: Recent Labs  Lab 05/15/22 0742 05/16/22 0211  NA 139 139  K 2.9* 3.0*  CL 98 101  CO2 30 30  GLUCOSE 122* 90  BUN 20 20  CREATININE 0.98 0.93  CALCIUM 9.3 8.9  MG 1.8 1.8   GFR: Estimated Creatinine Clearance: 41 mL/min (by C-G formula based on SCr of 0.93 mg/dL). Liver Function Tests: Recent Labs  Lab 05/15/22 0742  AST 28  ALT 20  ALKPHOS 72  BILITOT 0.6  PROT 6.1*  ALBUMIN 2.8*   No results for input(s): "LIPASE", "AMYLASE" in the last 168 hours. No results for input(s): "AMMONIA" in the last 168 hours. Coagulation Profile: No results for input(s): "INR", "PROTIME" in the last 168 hours. Cardiac Enzymes: No results for input(s): "CKTOTAL", "CKMB", "CKMBINDEX", "TROPONINI" in the last 168 hours. BNP (last 3 results) Recent Labs    10/04/21 0916  PROBNP 27.0   HbA1C: Recent Labs    05/16/22 0834  HGBA1C 5.5   CBG: Recent Labs  Lab 05/15/22 0741  GLUCAP  127*   Lipid Profile: No results for input(s): "CHOL", "HDL", "LDLCALC", "TRIG", "CHOLHDL", "LDLDIRECT" in the last 72 hours. Thyroid Function Tests: No results for input(s): "TSH", "T4TOTAL", "FREET4", "T3FREE", "THYROIDAB" in the last 72 hours. Anemia Panel: No results for input(s): "VITAMINB12", "FOLATE", "FERRITIN", "TIBC", "IRON", "RETICCTPCT" in the last 72 hours. Sepsis Labs: No results for input(s): "PROCALCITON", "LATICACIDVEN" in the last 168 hours.  Recent Results (from the past 240 hour(s))  Resp Panel by RT-PCR (Flu A&B, Covid) Anterior Nasal Swab     Status: None   Collection Time: 05/15/22  2:53 PM   Specimen: Anterior Nasal Swab  Result Value Ref Range Status   SARS Coronavirus 2 by RT PCR NEGATIVE NEGATIVE Final    Comment: (NOTE) SARS-CoV-2 target nucleic acids are NOT DETECTED.  The SARS-CoV-2 RNA is generally detectable in upper respiratory specimens during the acute phase of infection. The lowest concentration of SARS-CoV-2 viral copies this assay can detect is 138 copies/mL. A negative result does not preclude SARS-Cov-2 infection and should not be used as the sole basis for treatment or other patient management decisions. A negative result may occur with  improper specimen collection/handling, submission of specimen other than nasopharyngeal swab, presence of viral mutation(s) within the areas targeted by this assay, and inadequate number of viral copies(<138 copies/mL). A negative result must be combined with clinical observations, patient history, and epidemiological information. The expected result is Negative.  Fact Sheet for Patients:  EntrepreneurPulse.com.au  Fact Sheet for Healthcare Providers:  IncredibleEmployment.be  This test is no t yet approved or cleared by the [place] FDA and  has been authorized for detection and/or diagnosis of SARS-CoV-2 by FDA under an Emergency Use Authorization (EUA). This  EUA will remain  in effect (meaning this test can be used) for the duration of the COVID-19 declaration under Section 564(b)(1) of the Act, 21 U.S.C.section 360bbb-3(b)(1), unless the authorization is terminated  or revoked sooner.       Influenza A by PCR NEGATIVE NEGATIVE Final   Influenza B by PCR NEGATIVE NEGATIVE Final    Comment: (NOTE) The Xpert Xpress SARS-CoV-2/FLU/RSV plus assay is intended as an aid in the diagnosis of influenza from Nasopharyngeal swab specimens and should not be used as a sole basis for treatment. Nasal washings and aspirates are unacceptable for Xpert Xpress SARS-CoV-2/FLU/RSV testing.  Fact Sheet for Patients: EntrepreneurPulse.com.au  Fact Sheet for Healthcare Providers: IncredibleEmployment.be  This test is not yet  approved or cleared by the [place] and has been authorized for detection and/or diagnosis of SARS-CoV-2 by FDA under an Emergency Use Authorization (EUA). This EUA will remain in effect (meaning this test can be used) for the duration of the COVID-19 declaration under Section 564(b)(1) of the Act, 21 U.S.C. section 360bbb-3(b)(1), unless the authorization is terminated or revoked.  Performed at [facility], [place] [address] [place], [place] [address]          Radiology Studies: EEG adult  Result Date: 2022/06/03 Lora Havens, MD     03-Jun-2022  9:57 AM Patient Name: [name] MRN: [id number] Epilepsy Attending: Lora Havens Referring Physician/Provider: Karmen Bongo, MD Date: 06/03/2022 Duration: 25.19 mins Patient history: 81 year old male presented with syncope.  EEG to evaluate for seizure. Level of alertness: Awake, asleep AEDs during EEG study: None Technical aspects: This EEG study was done with scalp electrodes positioned according to the 10-20 International system of electrode placement. Electrical activity was acquired at a sampling rate of 500Hz  and  reviewed with a high frequency filter of 70Hz  and a low frequency filter of 1Hz . EEG data were recorded continuously and digitally stored. Description: The posterior dominant rhythm consists of 9 Hz activity of moderate voltage (25-35 uV) seen predominantly in posterior head regions, symmetric and reactive to eye opening and eye closing. Sleep was characterized by vertex waves, sleep spindles (12 to 14 Hz), maximal frontocentral region.  Hyperventilation and photic stimulation were not performed.   IMPRESSION: This study is within normal limits. No seizures or epileptiform discharges were seen throughout the recording. Lora Havens   CT RENAL STONE STUDY  Result Date: June 03, 2022 CLINICAL DATA:  Flank pain.  Concern for kidney stone. EXAM: CT ABDOMEN AND PELVIS WITHOUT CONTRAST TECHNIQUE: Multidetector CT imaging of the abdomen and pelvis was performed following the standard protocol without IV contrast. RADIATION DOSE REDUCTION: This exam was performed according to the departmental dose-optimization program which includes automated exposure control, adjustment of the mA and/or kV according to patient size and/or use of iterative reconstruction technique. COMPARISON:  None Available. FINDINGS: Evaluation of this exam is limited in the absence of intravenous contrast as well as due to streak artifact caused by patient's arms. Lower chest: The visualized lung bases are clear. There is coronary vascular calcification and cardiac pacemaker wire. No intra-abdominal free air.  Small free fluid within the pelvis. Hepatobiliary: The liver is unremarkable. No biliary dilatation. The gallbladder is unremarkable Pancreas: Small scattered pancreatic calcification, likely sequela of chronic pancreatitis. No active inflammatory changes. No dilatation of the main pancreatic duct or gland atrophy Spleen: Normal in size without focal abnormality. Adrenals/Urinary Tract: The adrenal glands are unremarkable. Multiple the the  nonobstructing left renal calculi measure up to 5 mm in the inferior pole of the left kidney. Linear calcification in the right kidney may represent vascular calcification or nonobstructing calculi. There is no hydronephrosis on either side. There is a 4.5 cm left renal inferior pole cyst. Several additional bilateral hypodense lesions are not characterized on this CT. The this can be better evaluated with ultrasound on a nonemergent/outpatient basis. The urinary bladder is grossly unremarkable. Small pocket of air within the bladder may have been introduced by recent instrumentation. Correlation with urinalysis recommended to exclude cystitis. Stomach/Bowel: There is severe sigmoid diverticulosis without active inflammatory changes. There is moderate stool throughout the colon. There is no bowel obstruction. The appendix is normal as visualized. Vascular/Lymphatic: Advanced aortoiliac atherosclerotic disease. The  IVC is unremarkable. No portal venous gas. There is no adenopathy. Reproductive: The prostate and seminal vesicles are grossly unremarkable. No pelvic mass. Other: There is diffuse subcutaneous edema.  No fluid collection. Musculoskeletal: Osteopenia with scoliosis and degenerative changes of the spine. The there is age indeterminate compression fracture of superior endplate of T12 with approximately the 30% loss of vertebral body height. Correlation with clinical exam and point tenderness recommended. IMPRESSION: 1. Nonobstructing left renal calculi. No hydronephrosis. 2. Vascular calcification versus nonobstructing right renal stones. No hydronephrosis. 3. Severe sigmoid diverticulosis. No bowel obstruction. Normal appendix. 4. Age indeterminate compression fracture of superior endplate of T12. Correlation with clinical exam and point tenderness recommended. 5. Aortic Atherosclerosis (ICD10-I70.0). Electronically Signed   By: Elgie Collard M.D.   On: 05/16/2022 03:39        Scheduled Meds:   ezetimibe  10 mg Oral Daily   feeding supplement  237 mL Oral TID BM   leptospermum manuka honey  1 Application Topical Daily   mirtazapine  7.5 mg Oral QHS   multivitamin with minerals  1 tablet Oral Daily   nicotine  14 mg Transdermal Daily   rosuvastatin  40 mg Oral Daily   sodium chloride flush  3 mL Intravenous Q12H   thiamine  100 mg Oral Daily   Continuous Infusions:  sodium chloride       LOS: 1 day    Time spent: 45 minutes spent on chart review, discussion with nursing staff, consultants, updating family and interview/physical exam; more than 50% of that time was spent in counseling and/or coordination of care.    Joseph Art, DO [facility] Available via Epic secure chat 7am-7pm After these hours, please refer to coverage provider listed on [email or web address] 05/17/2022, 9:22 AM

## 2022-05-17 NOTE — TOC Progression Note (Addendum)
Transition of Care [facility]) - Progression Note    Patient Details  Name: [name] MRN: [id number] Date of Birth: 1941/01/23  Transition [facility] CM/SW Contact  Delilah Shan, LCSWA Phone Number: 05/17/2022, 12:16 PM  Clinical Narrative:     Update- CSW received call from patients daughter [name] who chose SNF placement at [facility]. CSW called [facility] to confirm SNF bed offer. CSW was informed Admissions coordinator already left for the day. Weekend Child psychotherapist to follow up and confirm SNF bed offer for patient. CSW will continue to follow.  Update- CSW received callback from [place] patients daughter and provided SNF bed offers. Patients informed CSW going to go tour [facility] and [facility] to help with making SNF choice for patient. CSW awaiting callback. CSW will continue to follow.  Update- CSW called patients daughter [name] and LVM. CSW awaiting callback to discuss SNF bed offers. CSW will continue to follow.  CSW received consult to make referral for palliative services to follow patient at SNF when medically ready for dc. Patients daughter [name]  gave CSW permission to make referral to [facility] for palliative services to follow patient at SNF.CSW spoke with Victorino Dike with [facility] and made referral for palliative services to follow patient at SNF. CSW will continue to follow and assist with patients dc planning needs.  Expected Discharge Plan: Skilled Nursing Facility Barriers to Discharge: Continued Medical Work up  Expected Discharge Plan and Services Expected Discharge Plan: Skilled Nursing Facility In-house Referral: Clinical Social Work     Living arrangements for the past 2 months: Single Family Home                                       Social Determinants of Health (SDOH) Interventions    Readmission Risk Interventions     No data to display

## 2022-05-17 NOTE — Progress Notes (Signed)
[facility] [id number] [facility]) Hospital Liaison note:  Notified by Idalia Needle of request for [facility] Palliative Care services. Will continue to follow for disposition.  Please call with any outpatient palliative questions or concerns.  Thank you for the opportunity to participate in this patient's care.  Thank you, Abran Cantor, LPN [facility] Hospital Liaison [phone]

## 2022-05-18 DIAGNOSIS — R55 Syncope and collapse: Secondary | ICD-10-CM | POA: Diagnosis not present

## 2022-05-18 LAB — CBC
HCT: 30.4 % — ABNORMAL LOW (ref 39.0–52.0)
Hemoglobin: 10.1 g/dL — ABNORMAL LOW (ref 13.0–17.0)
MCH: 31.9 pg (ref 26.0–34.0)
MCHC: 33.2 g/dL (ref 30.0–36.0)
MCV: 95.9 fL (ref 80.0–100.0)
Platelets: 249 10*3/uL (ref 150–400)
RBC: 3.17 MIL/uL — ABNORMAL LOW (ref 4.22–5.81)
RDW: 15 % (ref 11.5–15.5)
WBC: 7.6 10*3/uL (ref 4.0–10.5)
nRBC: 0 % (ref 0.0–0.2)

## 2022-05-18 LAB — BASIC METABOLIC PANEL
Anion gap: 5 (ref 5–15)
BUN: 28 mg/dL — ABNORMAL HIGH (ref 8–23)
CO2: 26 mmol/L (ref 22–32)
Calcium: 8.7 mg/dL — ABNORMAL LOW (ref 8.9–10.3)
Chloride: 102 mmol/L (ref 98–111)
Creatinine, Ser: 0.92 mg/dL (ref 0.61–1.24)
GFR, Estimated: >60 mL/min (ref >60)
Glucose, Bld: 111 mg/dL — ABNORMAL HIGH (ref 70–99)
Potassium: 3.7 mmol/L (ref 3.5–5.1)
Sodium: 133 mmol/L — ABNORMAL LOW (ref 135–145)

## 2022-05-18 NOTE — Progress Notes (Signed)
PROGRESS NOTE    [name]  MGN:[id number] DOB: [birth year] DOA: 05/15/2022 PCP: Etta Grandchild, MD    Brief Narrative:  81 year old gentleman with history of anemia distant pacemaker for complete heart block, failure to thrive and ambulatory dysfunction brought back to the emergency room with dizziness, confusion.  Recent worsening clinical status.  Declining ambulatory function.   Assessment & Plan:   Syncope and collapse: Probably orthostatic, suspect vasovagal with hematuria.  Stabilized now.  Orthostatic negative. EEG normal. No focal logical deficit. Pacemaker is functioning well and no evidence of dysrhythmias.  Hypokalemia: Adequate.  Replaced.  Painless hematuria: Resolved.  Urine examination today without gross bleeding.  Continue to monitor postvoid residual. CT scan of the abdomen pelvis with nonobstructive renal calculi.  4.5 cm left renal inferior pole cyst.  Urinary bladder unremarkable.  Complete heart block: Status post pacemaker.  Stable.  Failure to thrive Mild cognitive impairment Advanced physical debility  -Work with PT OT.  Rehab trial. -Continue palliative care follow-up.  May ultimately benefit with hospice level of care.  Pressure Injury 05/15/22 Coccyx Right;Left Stage 2 -  Partial thickness loss of dermis presenting as a shallow open injury with a red, pink wound bed without slough. small open area x2. (Active)  05/15/22 1800  Location: Coccyx  Location Orientation: Right;Left  Staging: Stage 2 -  Partial thickness loss of dermis presenting as a shallow open injury with a red, pink wound bed without slough.  Wound Description (Comments): small open area x2.  Present on Admission: Yes     Pressure Injury 05/15/22 Heel Right Stage 1 -  Intact skin with non-blanchable redness of a localized area usually over a bony prominence. boggy heel. unblanchable. (Active)  05/15/22 1800  Location: Heel  Location Orientation: Right  Staging: Stage 1 -   Intact skin with non-blanchable redness of a localized area usually over a bony prominence.  Wound Description (Comments): boggy heel. unblanchable.  Present on Admission: Yes     Pressure Injury 05/15/22 Coccyx Right;Left Stage 2 -  Partial thickness loss of dermis presenting as a shallow open injury with a red, pink wound bed without slough. small open area x2. (Active)  05/15/22 1800  Location: Coccyx  Location Orientation: Right;Left  Staging: Stage 2 -  Partial thickness loss of dermis presenting as a shallow open injury with a red, pink wound bed without slough.  Wound Description (Comments): small open area x2.  Present on Admission: Yes       DVT prophylaxis: Place TED hose Start: 05/16/22 1201 Place and maintain sequential compression device Start: 05/16/22 0003 Place and maintain sequential compression device Start: 05/16/22 0003   Code Status: DNR Family Communication: None Disposition Plan: Status is: Inpatient Remains inpatient appropriate because: Unsafe discharge plan.  Stable to discharge to SNF when bed available.     Consultants:  Palliative care  Procedures:  None  Antimicrobials:  None   Subjective: Patient seen and examined.  No overnight events.  Denies any complaints.  Objective: Vitals:   05/17/22 2043 05/18/22 0420 05/18/22 0500 05/18/22 0757  BP: 121/82 115/69    Pulse: 97 91  (!) 109  Resp: 19 18  20   Temp: 98.5 F (36.9 C) 97.8 F (36.6 C)  97.9 F (36.6 C)  TempSrc: Oral Oral  Oral  SpO2: 91% 90%  92%  Weight:   45.9 kg   Height:        Intake/Output Summary (Last 24 hours) at 05/18/2022 1302 Last data  filed at 05/18/2022 0419 Gross per 24 hour  Intake 833.16 ml  Output 240 ml  Net 593.16 ml   Filed Weights   05/16/22 0510 05/17/22 0305 05/18/22 0500  Weight: 47.2 kg 47.3 kg 45.9 kg    Examination:  General: Frail debilitated.  Chronically sick looking.  Not in any distress. Cardiovascular: S1-S2 normal.  Regular rate  rhythm.  Pacemaker left precordium. Respiratory: Bilateral clear.  No added sounds. Gastrointestinal: Soft.  Nontender bowel sound present. Ext: No edema.  No deformities. Neuro: Alert and oriented.  Flat affect.  Generalized weakness.    Data Reviewed: I have personally reviewed following labs and imaging studies  CBC: Recent Labs  Lab 05/15/22 0742 05/16/22 0211 05/17/22 1005 05/18/22 0209  WBC 13.8* 7.8 10.0 7.6  NEUTROABS 10.7*  --   --   --   HGB 12.5* 10.5* 11.1* 10.1*  HCT 37.1* 31.8* 33.9* 30.4*  MCV 95.4 95.2 95.8 95.9  PLT 348 297 292 249   Basic Metabolic Panel: Recent Labs  Lab 05/15/22 0742 05/16/22 0211 05/17/22 1005 05/18/22 0209  NA 139 139 138 133*  K 2.9* 3.0* 3.4* 3.7  CL 98 101 102 102  CO2 30 30 28 26   GLUCOSE 122* 90 127* 111*  BUN 20 20 21  28*  CREATININE 0.98 0.93 1.04 0.92  CALCIUM 9.3 8.9 9.2 8.7*  MG 1.8 1.8  --   --    GFR: Estimated Creatinine Clearance: 40.2 mL/min (by C-G formula based on SCr of 0.92 mg/dL). Liver Function Tests: Recent Labs  Lab 05/15/22 0742  AST 28  ALT 20  ALKPHOS 72  BILITOT 0.6  PROT 6.1*  ALBUMIN 2.8*   No results for input(s): "LIPASE", "AMYLASE" in the last 168 hours. No results for input(s): "AMMONIA" in the last 168 hours. Coagulation Profile: No results for input(s): "INR", "PROTIME" in the last 168 hours. Cardiac Enzymes: No results for input(s): "CKTOTAL", "CKMB", "CKMBINDEX", "TROPONINI" in the last 168 hours. BNP (last 3 results) Recent Labs    10/04/21 0916  PROBNP 27.0   HbA1C: Recent Labs    05/16/22 0834  HGBA1C 5.5   CBG: Recent Labs  Lab 05/15/22 0741  GLUCAP 127*   Lipid Profile: No results for input(s): "CHOL", "HDL", "LDLCALC", "TRIG", "CHOLHDL", "LDLDIRECT" in the last 72 hours. Thyroid Function Tests: No results for input(s): "TSH", "T4TOTAL", "FREET4", "T3FREE", "THYROIDAB" in the last 72 hours. Anemia Panel: No results for input(s): "VITAMINB12", "FOLATE",  "FERRITIN", "TIBC", "IRON", "RETICCTPCT" in the last 72 hours. Sepsis Labs: No results for input(s): "PROCALCITON", "LATICACIDVEN" in the last 168 hours.  Recent Results (from the past 240 hour(s))  Resp Panel by RT-PCR (Flu A&B, Covid) Anterior Nasal Swab     Status: None   Collection Time: 05/15/22  2:53 PM   Specimen: Anterior Nasal Swab  Result Value Ref Range Status   SARS Coronavirus 2 by RT PCR NEGATIVE NEGATIVE Final    Comment: (NOTE) SARS-CoV-2 target nucleic acids are NOT DETECTED.  The SARS-CoV-2 RNA is generally detectable in upper respiratory specimens during the acute phase of infection. The lowest concentration of SARS-CoV-2 viral copies this assay can detect is 138 copies/mL. A negative result does not preclude SARS-Cov-2 infection and should not be used as the sole basis for treatment or other patient management decisions. A negative result may occur with  improper specimen collection/handling, submission of specimen other than nasopharyngeal swab, presence of viral mutation(s) within the areas targeted by this assay, and inadequate number of  viral copies(<138 copies/mL). A negative result must be combined with clinical observations, patient history, and epidemiological information. The expected result is Negative.  Fact Sheet for Patients:  BloggerCourse.com  Fact Sheet for Healthcare Providers:  SeriousBroker.it  This test is no t yet approved or cleared by the [place] FDA and  has been authorized for detection and/or diagnosis of SARS-CoV-2 by FDA under an Emergency Use Authorization (EUA). This EUA will remain  in effect (meaning this test can be used) for the duration of the COVID-19 declaration under Section 564(b)(1) of the Act, 21 U.S.C.section 360bbb-3(b)(1), unless the authorization is terminated  or revoked sooner.       Influenza A by PCR NEGATIVE NEGATIVE Final   Influenza B by PCR  NEGATIVE NEGATIVE Final    Comment: (NOTE) The Xpert Xpress SARS-CoV-2/FLU/RSV plus assay is intended as an aid in the diagnosis of influenza from Nasopharyngeal swab specimens and should not be used as a sole basis for treatment. Nasal washings and aspirates are unacceptable for Xpert Xpress SARS-CoV-2/FLU/RSV testing.  Fact Sheet for Patients: BloggerCourse.com  Fact Sheet for Healthcare Providers: SeriousBroker.it  This test is not yet approved or cleared by the [place] FDA and has been authorized for detection and/or diagnosis of SARS-CoV-2 by FDA under an Emergency Use Authorization (EUA). This EUA will remain in effect (meaning this test can be used) for the duration of the COVID-19 declaration under Section 564(b)(1) of the Act, 21 U.S.C. section 360bbb-3(b)(1), unless the authorization is terminated or revoked.  Performed at [facility], [address], [place], [place] [address]          Radiology Studies: No results found.      Scheduled Meds:  ezetimibe  10 mg Oral Daily   feeding supplement  237 mL Oral TID BM   leptospermum manuka honey  1 Application Topical Daily   mirtazapine  7.5 mg Oral QHS   multivitamin with minerals  1 tablet Oral Daily   nicotine  14 mg Transdermal Daily   rosuvastatin  40 mg Oral Daily   sodium chloride flush  3 mL Intravenous Q12H   thiamine  100 mg Oral Daily   Continuous Infusions:   LOS: 2 days    Time spent: 35 minutes    Dorcas Carrow, MD [facility] Pager [phone]

## 2022-05-19 DIAGNOSIS — R55 Syncope and collapse: Secondary | ICD-10-CM | POA: Diagnosis not present

## 2022-05-19 LAB — PSA: Prostatic Specific Antigen: 0.52 ng/mL (ref 0.00–4.00)

## 2022-05-19 MED ORDER — ORAL CARE MOUTH RINSE: 15.0000 mL | OROMUCOSAL | Status: DC | PRN

## 2022-05-19 NOTE — Progress Notes (Signed)
Mobility Specialist Progress Note    05/19/22 1210  Mobility  Activity Ambulated with assistance in hallway  Level of Assistance Contact guard assist, steadying assist  Assistive Device Front wheel walker  Distance Ambulated (ft) 200 ft  Activity Response Tolerated well  $Mobility charge 1 Mobility   Post-Mobility: 98 HR  Pt received in chair and agreeable. No complaints on walk. Needed cues for positioning in AD and redirecting from steering left. Returned to chair with call bell in reach.    Hensley Nation Mobility Specialist

## 2022-05-19 NOTE — TOC Progression Note (Addendum)
Transition of Care [facility]) - Progression Note    Patient Details  Name: [name] MRN: [id number] Date of Birth: [birth year]  [facility]) CM/SW Contact  Patrice Paradise, LCSW Phone Number: 05/19/2022, 8:43 AM  Clinical Narrative:     CSW attempted to call admission staff at [facility] to confirm bed offer however had to leave a voice mail.  1;00PM- CSW spoke with [facility] [place] and she confirmed that pt has bed at facility and they can accept tomorrow.  TOC team will continue to assist with discharge planning needs.   Expected Discharge Plan: Skilled Nursing Facility Barriers to Discharge: Continued Medical Work up  Expected Discharge Plan and Services Expected Discharge Plan: Skilled Nursing Facility In-house Referral: Clinical Social Work     Living arrangements for the past 2 months: Single Family Home                                       Social Determinants of Health (SDOH) Interventions    Readmission Risk Interventions     No data to display

## 2022-05-19 NOTE — Progress Notes (Signed)
PROGRESS NOTE    [name]  TGG:[id number] DOB: [birth year] DOA: 05/15/2022 PCP: Etta Grandchild, MD    Brief Narrative:  81 year old gentleman with history of anemia distant pacemaker for complete heart block, failure to thrive and ambulatory dysfunction brought back to the emergency room with dizziness, confusion.  Recent worsening clinical status.  Declining ambulatory function.   Assessment & Plan:   Syncope and collapse: Probably orthostatic, suspect vasovagal with hematuria.  Stabilized now.  Orthostatic negative. EEG normal. No focal logical deficit. Pacemaker is functioning well and no evidence of dysrhythmias.  Hypokalemia: Adequate.  Replaced.  Painless hematuria: Intermittent hematuria.  No gross bleeding noted. Check PSA. Ongoing for more than a month.  Check urine culture. CT scan of the abdomen pelvis with nonobstructive renal calculi.  4.5 cm left renal inferior pole cyst.  Urinary bladder unremarkable. Patient will need cystoscopy evaluation, will send to neurology for follow-up.  Complete heart block: Status post pacemaker.  Stable.  Failure to thrive Mild cognitive impairment Advanced physical debility  -Work with PT OT.  Rehab trial. -Continue palliative care follow-up.  May ultimately benefit with hospice level of care.  Pressure Injury 04/30/22 Sacrum Right;Left Stage 2 -  Partial thickness loss of dermis presenting as a shallow open injury with a red, pink wound bed without slough. (Active)  04/30/22 2200  Location: Sacrum  Location Orientation: Right;Left  Staging: Stage 2 -  Partial thickness loss of dermis presenting as a shallow open injury with a red, pink wound bed without slough.  Wound Description (Comments):   Present on Admission: Yes     Pressure Injury 05/15/22 Coccyx Right;Left Stage 2 -  Partial thickness loss of dermis presenting as a shallow open injury with a red, pink wound bed without slough. small open area x2. (Active)   05/15/22 1800  Location: Coccyx  Location Orientation: Right;Left  Staging: Stage 2 -  Partial thickness loss of dermis presenting as a shallow open injury with a red, pink wound bed without slough.  Wound Description (Comments): small open area x2.  Present on Admission: Yes     Pressure Injury 05/15/22 Heel Right Stage 1 -  Intact skin with non-blanchable redness of a localized area usually over a bony prominence. boggy heel. unblanchable. (Active)  05/15/22 1800  Location: Heel  Location Orientation: Right  Staging: Stage 1 -  Intact skin with non-blanchable redness of a localized area usually over a bony prominence.  Wound Description (Comments): boggy heel. unblanchable.  Present on Admission: Yes     Pressure Injury 05/15/22 Coccyx Right;Left Stage 2 -  Partial thickness loss of dermis presenting as a shallow open injury with a red, pink wound bed without slough. small open area x2. (Active)  05/15/22 1800  Location: Coccyx  Location Orientation: Right;Left  Staging: Stage 2 -  Partial thickness loss of dermis presenting as a shallow open injury with a red, pink wound bed without slough.  Wound Description (Comments): small open area x2.  Present on Admission: Yes       DVT prophylaxis: Place TED hose Start: 05/16/22 1201 Place and maintain sequential compression device Start: 05/16/22 0003 Place and maintain sequential compression device Start: 05/16/22 0003   Code Status: DNR Family Communication: Daughter on the phone Disposition Plan: Status is: Inpatient Remains inpatient appropriate because: Unsafe discharge plan.  Stable to discharge to SNF when bed available.     Consultants:  Palliative care  Procedures:  None  Antimicrobials:  None   Subjective:  Seen and examined.  Sitting in chair.  Denies any complaints.  He is expecting to go to rehab anytime soon. Called patient's daughter to discuss.  She is also worried about his hematuria.  Yesterday noted  he had clear urine and had a small amount of amber-colored urine.  Denies any dysuria or urinary retention.  Objective: Vitals:   05/18/22 1500 05/18/22 2100 05/19/22 0551 05/19/22 0852  BP: 112/67 108/63 126/73 122/64  Pulse: 93 100 (!) 101 (!) 115  Resp: 20 20 18    Temp: 98.4 F (36.9 C) 99.6 F (37.6 C) 97.8 F (36.6 C) 97.9 F (36.6 C)  TempSrc: Oral Oral Oral Oral  SpO2: 92% 91% 96% 95%  Weight:   48.9 kg   Height:        Intake/Output Summary (Last 24 hours) at 05/19/2022 1154 Last data filed at 05/18/2022 2200 Gross per 24 hour  Intake 3 ml  Output 25 ml  Net -22 ml   Filed Weights   05/17/22 0305 05/18/22 0500 05/19/22 0551  Weight: 47.3 kg 45.9 kg 48.9 kg    Examination:  General: Frail debilitated.  Chronically sick looking.  Not in any distress. Sitting in chair and eating breakfast. Cardiovascular: S1-S2 normal.  Regular rate rhythm.  Pacemaker left precordium. Respiratory: Bilateral clear.  No added sounds. Gastrointestinal: Soft.  Nontender bowel sound present. Ext: No edema.  No deformities. Neuro: Alert and oriented.  Flat affect.  Generalized weakness.    Data Reviewed: I have personally reviewed following labs and imaging studies  CBC: Recent Labs  Lab 05/15/22 0742 05/16/22 0211 05/17/22 1005 05/18/22 0209  WBC 13.8* 7.8 10.0 7.6  NEUTROABS 10.7*  --   --   --   HGB 12.5* 10.5* 11.1* 10.1*  HCT 37.1* 31.8* 33.9* 30.4*  MCV 95.4 95.2 95.8 95.9  PLT 348 297 292 249   Basic Metabolic Panel: Recent Labs  Lab 05/15/22 0742 05/16/22 0211 05/17/22 1005 05/18/22 0209  NA 139 139 138 133*  K 2.9* 3.0* 3.4* 3.7  CL 98 101 102 102  CO2 30 30 28 26   GLUCOSE 122* 90 127* 111*  BUN 20 20 21  28*  CREATININE 0.98 0.93 1.04 0.92  CALCIUM 9.3 8.9 9.2 8.7*  MG 1.8 1.8  --   --    GFR: Estimated Creatinine Clearance: 42.8 mL/min (by C-G formula based on SCr of 0.92 mg/dL). Liver Function Tests: Recent Labs  Lab 05/15/22 0742  AST 28  ALT  20  ALKPHOS 72  BILITOT 0.6  PROT 6.1*  ALBUMIN 2.8*   No results for input(s): "LIPASE", "AMYLASE" in the last 168 hours. No results for input(s): "AMMONIA" in the last 168 hours. Coagulation Profile: No results for input(s): "INR", "PROTIME" in the last 168 hours. Cardiac Enzymes: No results for input(s): "CKTOTAL", "CKMB", "CKMBINDEX", "TROPONINI" in the last 168 hours. BNP (last 3 results) Recent Labs    10/04/21 0916  PROBNP 27.0   HbA1C: No results for input(s): "HGBA1C" in the last 72 hours.  CBG: Recent Labs  Lab 05/15/22 0741  GLUCAP 127*   Lipid Profile: No results for input(s): "CHOL", "HDL", "LDLCALC", "TRIG", "CHOLHDL", "LDLDIRECT" in the last 72 hours. Thyroid Function Tests: No results for input(s): "TSH", "T4TOTAL", "FREET4", "T3FREE", "THYROIDAB" in the last 72 hours. Anemia Panel: No results for input(s): "VITAMINB12", "FOLATE", "FERRITIN", "TIBC", "IRON", "RETICCTPCT" in the last 72 hours. Sepsis Labs: No results for input(s): "PROCALCITON", "LATICACIDVEN" in the last 168 hours.  Recent Results (from the  past 240 hour(s))  Resp Panel by RT-PCR (Flu A&B, Covid) Anterior Nasal Swab     Status: None   Collection Time: 05/15/22  2:53 PM   Specimen: Anterior Nasal Swab  Result Value Ref Range Status   SARS Coronavirus 2 by RT PCR NEGATIVE NEGATIVE Final    Comment: (NOTE) SARS-CoV-2 target nucleic acids are NOT DETECTED.  The SARS-CoV-2 RNA is generally detectable in upper respiratory specimens during the acute phase of infection. The lowest concentration of SARS-CoV-2 viral copies this assay can detect is 138 copies/mL. A negative result does not preclude SARS-Cov-2 infection and should not be used as the sole basis for treatment or other patient management decisions. A negative result may occur with  improper specimen collection/handling, submission of specimen other than nasopharyngeal swab, presence of viral mutation(s) within the areas targeted  by this assay, and inadequate number of viral copies(<138 copies/mL). A negative result must be combined with clinical observations, patient history, and epidemiological information. The expected result is Negative.  Fact Sheet for Patients:  EntrepreneurPulse.com.au  Fact Sheet for Healthcare Providers:  IncredibleEmployment.be  This test is no t yet approved or cleared by the [place] FDA and  has been authorized for detection and/or diagnosis of SARS-CoV-2 by FDA under an Emergency Use Authorization (EUA). This EUA will remain  in effect (meaning this test can be used) for the duration of the COVID-19 declaration under Section 564(b)(1) of the Act, 21 U.S.C.section 360bbb-3(b)(1), unless the authorization is terminated  or revoked sooner.       Influenza A by PCR NEGATIVE NEGATIVE Final   Influenza B by PCR NEGATIVE NEGATIVE Final    Comment: (NOTE) The Xpert Xpress SARS-CoV-2/FLU/RSV plus assay is intended as an aid in the diagnosis of influenza from Nasopharyngeal swab specimens and should not be used as a sole basis for treatment. Nasal washings and aspirates are unacceptable for Xpert Xpress SARS-CoV-2/FLU/RSV testing.  Fact Sheet for Patients: EntrepreneurPulse.com.au  Fact Sheet for Healthcare Providers: IncredibleEmployment.be  This test is not yet approved or cleared by the [place] FDA and has been authorized for detection and/or diagnosis of SARS-CoV-2 by FDA under an Emergency Use Authorization (EUA). This EUA will remain in effect (meaning this test can be used) for the duration of the COVID-19 declaration under Section 564(b)(1) of the Act, 21 U.S.C. section 360bbb-3(b)(1), unless the authorization is terminated or revoked.  Performed at [facility], [place] [address] [place], [place] [address]          Radiology Studies: No results found.      Scheduled  Meds:  ezetimibe  10 mg Oral Daily   feeding supplement  237 mL Oral TID BM   leptospermum manuka honey  1 Application Topical Daily   mirtazapine  7.5 mg Oral QHS   multivitamin with minerals  1 tablet Oral Daily   nicotine  14 mg Transdermal Daily   rosuvastatin  40 mg Oral Daily   sodium chloride flush  3 mL Intravenous Q12H   thiamine  100 mg Oral Daily   Continuous Infusions:   LOS: 3 days    Time spent: 35 minutes    Barb Merino, MD [facility] Pager [phone]

## 2022-05-20 DIAGNOSIS — J449 Chronic obstructive pulmonary disease, unspecified: Secondary | ICD-10-CM | POA: Diagnosis not present

## 2022-05-20 DIAGNOSIS — J9611 Chronic respiratory failure with hypoxia: Secondary | ICD-10-CM | POA: Diagnosis not present

## 2022-05-20 DIAGNOSIS — I455 Other specified heart block: Secondary | ICD-10-CM | POA: Diagnosis not present

## 2022-05-20 DIAGNOSIS — Z950 Presence of cardiac pacemaker: Secondary | ICD-10-CM | POA: Diagnosis not present

## 2022-05-20 DIAGNOSIS — G459 Transient cerebral ischemic attack, unspecified: Secondary | ICD-10-CM | POA: Diagnosis not present

## 2022-05-20 DIAGNOSIS — E876 Hypokalemia: Secondary | ICD-10-CM | POA: Diagnosis not present

## 2022-05-20 DIAGNOSIS — F17210 Nicotine dependence, cigarettes, uncomplicated: Secondary | ICD-10-CM | POA: Diagnosis not present

## 2022-05-20 DIAGNOSIS — R627 Adult failure to thrive: Secondary | ICD-10-CM | POA: Diagnosis not present

## 2022-05-20 DIAGNOSIS — L89312 Pressure ulcer of right buttock, stage 2: Secondary | ICD-10-CM | POA: Diagnosis not present

## 2022-05-20 DIAGNOSIS — I10 Essential (primary) hypertension: Secondary | ICD-10-CM | POA: Diagnosis not present

## 2022-05-20 DIAGNOSIS — I679 Cerebrovascular disease, unspecified: Secondary | ICD-10-CM | POA: Diagnosis not present

## 2022-05-20 DIAGNOSIS — R0689 Other abnormalities of breathing: Secondary | ICD-10-CM | POA: Diagnosis not present

## 2022-05-20 DIAGNOSIS — L89621 Pressure ulcer of left heel, stage 1: Secondary | ICD-10-CM | POA: Diagnosis not present

## 2022-05-20 DIAGNOSIS — R2243 Localized swelling, mass and lump, lower limb, bilateral: Secondary | ICD-10-CM | POA: Diagnosis not present

## 2022-05-20 DIAGNOSIS — I2510 Atherosclerotic heart disease of native coronary artery without angina pectoris: Secondary | ICD-10-CM | POA: Diagnosis not present

## 2022-05-20 DIAGNOSIS — I6523 Occlusion and stenosis of bilateral carotid arteries: Secondary | ICD-10-CM | POA: Diagnosis not present

## 2022-05-20 DIAGNOSIS — E119 Type 2 diabetes mellitus without complications: Secondary | ICD-10-CM | POA: Diagnosis not present

## 2022-05-20 DIAGNOSIS — I700 Atherosclerosis of aorta: Secondary | ICD-10-CM | POA: Diagnosis not present

## 2022-05-20 DIAGNOSIS — J441 Chronic obstructive pulmonary disease with (acute) exacerbation: Secondary | ICD-10-CM | POA: Diagnosis not present

## 2022-05-20 DIAGNOSIS — E43 Unspecified severe protein-calorie malnutrition: Secondary | ICD-10-CM | POA: Diagnosis not present

## 2022-05-20 DIAGNOSIS — Z7401 Bed confinement status: Secondary | ICD-10-CM | POA: Diagnosis not present

## 2022-05-20 DIAGNOSIS — I21A1 Myocardial infarction type 2: Secondary | ICD-10-CM | POA: Diagnosis not present

## 2022-05-20 DIAGNOSIS — M1990 Unspecified osteoarthritis, unspecified site: Secondary | ICD-10-CM | POA: Diagnosis not present

## 2022-05-20 DIAGNOSIS — D649 Anemia, unspecified: Secondary | ICD-10-CM | POA: Diagnosis not present

## 2022-05-20 DIAGNOSIS — E781 Pure hyperglyceridemia: Secondary | ICD-10-CM | POA: Diagnosis not present

## 2022-05-20 DIAGNOSIS — R5383 Other fatigue: Secondary | ICD-10-CM | POA: Diagnosis not present

## 2022-05-20 DIAGNOSIS — R319 Hematuria, unspecified: Secondary | ICD-10-CM | POA: Diagnosis not present

## 2022-05-20 DIAGNOSIS — F322 Major depressive disorder, single episode, severe without psychotic features: Secondary | ICD-10-CM | POA: Diagnosis not present

## 2022-05-20 DIAGNOSIS — Z043 Encounter for examination and observation following other accident: Secondary | ICD-10-CM | POA: Diagnosis not present

## 2022-05-20 DIAGNOSIS — L89322 Pressure ulcer of left buttock, stage 2: Secondary | ICD-10-CM | POA: Diagnosis not present

## 2022-05-20 DIAGNOSIS — E785 Hyperlipidemia, unspecified: Secondary | ICD-10-CM | POA: Diagnosis not present

## 2022-05-20 DIAGNOSIS — F32A Depression, unspecified: Secondary | ICD-10-CM | POA: Diagnosis not present

## 2022-05-20 DIAGNOSIS — E8809 Other disorders of plasma-protein metabolism, not elsewhere classified: Secondary | ICD-10-CM | POA: Diagnosis not present

## 2022-05-20 DIAGNOSIS — E519 Thiamine deficiency, unspecified: Secondary | ICD-10-CM | POA: Diagnosis not present

## 2022-05-20 DIAGNOSIS — F329 Major depressive disorder, single episode, unspecified: Secondary | ICD-10-CM | POA: Diagnosis not present

## 2022-05-20 DIAGNOSIS — R55 Syncope and collapse: Secondary | ICD-10-CM | POA: Diagnosis not present

## 2022-05-20 DIAGNOSIS — E118 Type 2 diabetes mellitus with unspecified complications: Secondary | ICD-10-CM | POA: Diagnosis not present

## 2022-05-20 DIAGNOSIS — R531 Weakness: Secondary | ICD-10-CM | POA: Diagnosis not present

## 2022-05-20 DIAGNOSIS — R945 Abnormal results of liver function studies: Secondary | ICD-10-CM | POA: Diagnosis not present

## 2022-05-20 DIAGNOSIS — J439 Emphysema, unspecified: Secondary | ICD-10-CM | POA: Diagnosis not present

## 2022-05-20 LAB — URINE CULTURE: Culture: NO GROWTH

## 2022-05-20 NOTE — TOC Progression Note (Signed)
Transition of Care [facility]) - Progression Note    Patient Details  Name: JIHAN RUDY MRN: [id number] Date of Birth: Sep 20, 1941  Transition of [facility]) CM/SW Contact  Delilah Shan, LCSWA Phone Number: 05/20/2022, 10:43 AM  Clinical Narrative:     CSW spoke with [facility] with [facility] who confirmed they can accept patient today if medically ready. CSW informed MD. CSW spoke with patients daughter who request patient go by [place] transport. CSW will continue to follow and assist with patients dc planning needs.  Expected Discharge Plan: Skilled Nursing Facility Barriers to Discharge: No Barriers Identified  Expected Discharge Plan and Services Expected Discharge Plan: Skilled Nursing Facility In-house Referral: Clinical Social Work     Living arrangements for the past 2 months: Single Family Home Expected Discharge Date: 05/20/22                                     Social Determinants of Health (SDOH) Interventions    Readmission Risk Interventions     No data to display

## 2022-05-20 NOTE — Progress Notes (Signed)
Physical Therapy Treatment Patient Details Name: [name] MRN: [id number] DOB: May 05, 1941 Today's Date: 05/20/2022   History of Present Illness 81 y.o. male presents to [facility] hospital on 05/14/2022 with possible syncopal episode as well as a fall at home. Pt requiring assistance with ADLs since PPM placement in early July 2023. PMH includes CAD, HTN, HLF, PPM placement, MI.    PT Comments    Pt continuing to demonstrate muscular weakness, impaired balance during room and hallway mobility, and poor activity tolerance. Pt requires multiple cues throughout session for safety and to avoid falls. PT recommendations for follow up therapy remain unchanged, PT to continue to follow.      Recommendations for follow up therapy are one component of a multi-disciplinary discharge planning process, led by the attending physician.  Recommendations may be updated based on patient status, additional functional criteria and insurance authorization.  Follow Up Recommendations  Skilled nursing-short term rehab (<3 hours/day) Can patient physically be transported by private vehicle: Yes   Assistance Recommended at Discharge Frequent or constant Supervision/Assistance  Patient can return home with the following A little help with walking and/or transfers;A little help with bathing/dressing/bathroom;Assistance with cooking/housework;Direct supervision/assist for medications management;Direct supervision/assist for financial management;Assist for transportation;Help with stairs or ramp for entrance   Equipment Recommendations  None recommended by PT    Recommendations for Other Services       Precautions / Restrictions Precautions Precautions: Fall Restrictions Weight Bearing Restrictions: No     Mobility  Bed Mobility Overal bed mobility: Needs Assistance Bed Mobility: Supine to Sit     Supine to sit: Supervision, HOB elevated     General bed mobility comments: for safety, increased time and  effort with HOB elevated.    Transfers Overall transfer level: Needs assistance Equipment used: Rolling walker (2 wheels) Transfers: Sit to/from Stand Sit to Stand: Min assist           General transfer comment: assist to rise from low surface of bed, toilet, and recliner x5. Cues for safe hand placement    Ambulation/Gait Ambulation/Gait assistance: Min assist Gait Distance (Feet): 150 Feet Assistive device: Rolling walker (2 wheels) Gait Pattern/deviations: Step-through pattern, Decreased stride length, Shuffle, Drifts right/left, Trunk flexed Gait velocity: decr     General Gait Details: assist for steadying and navigating pt/Rw around obstacles in hallway as pt bumping into objects in hall x5 with no awareness of this. Cues for upright posture, placement within RW multiple times.   Stairs             Wheelchair Mobility    Modified Rankin (Stroke Patients Only)       Balance Overall balance assessment: Needs assistance Sitting-balance support: No upper extremity supported, Feet supported Sitting balance-Leahy Scale: Fair     Standing balance support: No upper extremity supported Standing balance-Leahy Scale: Poor Standing balance comment: reliant on external assist                            Cognition Arousal/Alertness: Awake/alert Behavior During Therapy: WFL for tasks assessed/performed Overall Cognitive Status: History of cognitive impairments - at baseline Area of Impairment: Attention, Following commands, Safety/judgement                   Current Attention Level: Sustained   Following Commands: Follows one step commands consistently Safety/Judgement: Decreased awareness of deficits, Decreased awareness of safety   Problem Solving: Requires verbal cues, Requires tactile cues, Slow  processing General Comments: pt requiring increased processing time to respond to questions, frequent redirection in hallway and verbal and  tactile assist to navigate around obstacles.        Exercises General Exercises - Lower Extremity Long Arc Quad: AROM, Both, Seated, 15 reps Hip Flexion/Marching: AROM, Both, Seated, 15 reps Mini-Sqauts: AROM, Both, 5 reps, Seated, Standing (sit to stands from recliner, min A for power up and slow eccentric lower)    General Comments        Pertinent Vitals/Pain Pain Assessment Pain Assessment: Faces Faces Pain Scale: No hurt    Home Living                          Prior Function            PT Goals (current goals can now be found in the care plan section) Acute Rehab PT Goals PT Goal Formulation: With patient Time For Goal Achievement: 05/30/22 Potential to Achieve Goals: Good Progress towards PT goals: Progressing toward goals    Frequency    Min 2X/week      PT Plan Current plan remains appropriate    Co-evaluation              AM-PAC PT "6 Clicks" Mobility   Outcome Measure  Help needed turning from your back to your side while in a flat bed without using bedrails?: A Little Help needed moving from lying on your back to sitting on the side of a flat bed without using bedrails?: A Little Help needed moving to and from a bed to a chair (including a wheelchair)?: A Lot (cog) Help needed standing up from a chair using your arms (e.g., wheelchair or bedside chair)?: A Little Help needed to walk in hospital room?: A Lot (cog) Help needed climbing 3-5 steps with a railing? : A Lot (cog) 6 Click Score: 15    End of Session Equipment Utilized During Treatment: Gait belt Activity Tolerance: Patient tolerated treatment well Patient left: in chair;with call bell/phone within reach;with chair alarm set Nurse Communication: Mobility status PT Visit Diagnosis: Unsteadiness on feet (R26.81);Muscle weakness (generalized) (M62.81);History of falling (Z91.81)     Time: 4481-8563 PT Time Calculation (min) (ACUTE ONLY): 26 min  Charges:  $Gait  Training: 8-22 mins $Therapeutic Exercise: 8-22 mins                     Marye Round, PT DPT Acute Rehabilitation Services Pager [phone]  Office [phone]    Kyrell Ruacho E Christain [place] 05/20/2022, 10:50 AM

## 2022-05-20 NOTE — Progress Notes (Signed)
Report given to [facility] at [facility], questions answered. Zollie Scale denies any further questions. AVS sent with PTAR. Transferred by PTAR at 1402.

## 2022-05-20 NOTE — Evaluation (Signed)
Speech Language Pathology Evaluation Patient Details Name: [name] MRN: [id number] DOB: 09-21-1941 Today's Date: 05/20/2022 Time: 1025-1100 SLP Time Calculation (min) (ACUTE ONLY): 35 min  Problem List:  Patient Active Problem List   Diagnosis Date Noted   Syncope 05/16/2022   Syncope and collapse 05/15/2022   Failure to thrive in adult 05/15/2022   Anemia due to acquired thiamine deficiency 05/13/2022   Diabetes mellitus (HCC) 05/13/2022   Current severe episode of major depressive disorder without psychotic features without prior episode (HCC) 05/13/2022   Pressure injury of skin 05/01/2022   Complete heart block (HCC) 04/29/2022   Hematuria 04/28/2022   Protein-calorie malnutrition, severe (HCC) 04/28/2022   Depression 04/28/2022   Tobacco abuse 04/28/2022   Hypokalemia 10/04/2021   Bilateral lower extremity edema 10/04/2021   Aortic atherosclerosis (HCC) 09/13/2021   Chronic hypoxemic respiratory failure (HCC) 08/16/2021   Carotid stenosis, asymptomatic, bilateral 12/08/2017   Demand ischemia of myocardium (HCC)    Essential hypertension    COPD III/ hypercarbic 10/07/2016   Chronic respiratory failure with hypercapnia (HCC) 10/07/2016   Pure hyperglyceridemia 09/04/2016   Moderate persistent asthma without complication 09/04/2016   Essential hypertension, benign 10/10/2009   Hyperlipidemia LDL goal <70 10/07/2008   CORONARY ATHEROSCLEROSIS NATIVE CORONARY ARTERY 10/07/2008   Cerebrovascular disease 10/07/2008   Past Medical History:  Past Medical History:  Diagnosis Date   Asthma    as a child   Carotid arterial disease (HCC)    a. 11/2015 Carotid U/S: LICA 40-59%, RICA 100 CTO, nl subclavian arteries--f/u 1 yr.   Coronary atherosclerosis of native coronary artery    a. 09/2001 inflat MI/PCI: RCA 44m (3.0x18 AVE S7 BMS); b. 07/2006 Cath: LM nl, LAD 50p, LCX min irregs, OM1 large, min irregs, OM2 min irregs, RCA 20 ISR;  c. 10/2012 MV: EF 74%, no ischemia.    Essential hypertension    Heart murmur    since a child   Hyperlipidemia    Myocardial infarction [facility]) 2002   Pneumonia    age 6   Right inguinal hernia    Past Surgical History:  Past Surgical History:  Procedure Laterality Date   CARDIAC CATHETERIZATION     Carotid stents     COLONOSCOPY     FRACTURE SURGERY     HERNIA REPAIR     ventral   INGUINAL HERNIA REPAIR Right 11/01/2016   Procedure: RIGHT INGUINAL HERNIA REPAIR WITH MESH;  Surgeon: Avel Peace, MD;  Location: [facility] OR;  Service: General;  Laterality: Right;   INSERTION OF MESH Right 11/01/2016   Procedure: INSERTION OF MESH;  Surgeon: Avel Peace, MD;  Location: [facility] OR;  Service: General;  Laterality: Right;   ORIF SHOULDER FRACTURE Right    PACEMAKER IMPLANT N/A 05/02/2022   Procedure: PACEMAKER IMPLANT;  Surgeon: Lanier Prude, MD;  Location: [facility] INVASIVE CV LAB;  Service: Cardiovascular;  Laterality: N/A;   HPI:  81yo male admitted 05/15/22 with syncope. PMH: CAD, HTN, HLD, MI, recent pacemaker placement, recent admit (7/1-7/23) for CHB. Family seeking placement.   Assessment / Plan / Recommendation Clinical Impression  Pt seen at bedside for cognitive linguistic evaluation. The Mini-Mental State Examination (MMSE) was administered. Pt scored 16/30, raising concern for significant cognitive impairment. Pt exhibited good immediate recall of 3/3 unrelated words. Pt had difficulty with orientation to time and place, spelling "world" backwards, delayed recall of unrelated words (recalled 1/3), and following 3-step command. Language screening was [facility] for naming, repeating, and following simple written direction.  Written tasks were not administered at this time. Pt's performance on this assessment raises concern for pt safety in independence, so 24 hour supervision is encouraged at discharge. Continued speech therapy at next level of care may be beneficial to maximize safety and minimize caregiver burden.    SLP  Assessment  SLP Recommendation/Assessment: All further Speech Language Pathology needs can be addressed in the next venue of care  SLP Visit Diagnosis: Cognitive communication deficit (R41.841)    Recommendations for follow up therapy are one component of a multi-disciplinary discharge planning process, led by the attending physician.  Recommendations may be updated based on patient status, additional functional criteria and insurance authorization.    Follow Up Recommendations  Follow physician's recommendations for discharge plan and follow up therapies    Assistance Recommended at Discharge  Frequent or constant Supervision/Assistance  Functional Status Assessment Patient has had a recent decline in their functional status and demonstrates the ability to make significant improvements in function in a reasonable and predictable amount of time.  Frequency and Duration           SLP Evaluation Cognition  Overall Cognitive Status: History of cognitive impairments - at baseline Arousal/Alertness: Awake/alert Orientation Level: Oriented to person;Oriented to place;Disoriented to time;Disoriented to situation Year: 2023 Month: June Day of Week: Incorrect       Comprehension  Auditory Comprehension Overall Auditory Comprehension: Appears within functional limits for tasks assessed    Expression Expression Primary Mode of Expression: Verbal Verbal Expression Overall Verbal Expression: Appears within functional limits for tasks assessed Written Expression Dominant Hand: Right   Oral / Motor  Oral Motor/Sensory Function Overall Oral Motor/Sensory Function: Within functional limits Motor Speech Overall Motor Speech: Appears within functional limits for tasks assessed Intelligibility: Intelligible           Kathaleya Mcduffee B. Murvin Natal, [facility], CCC-SLP Speech Language Pathologist Office: [phone]  Leigh Aurora 05/20/2022, 11:04 AM

## 2022-05-20 NOTE — Discharge Summary (Signed)
Physician Discharge Summary  [name] [id number] DOB: [birth year] DOA: 05/15/2022  PCP: Janith Lima, MD  Admit date: 05/15/2022 Discharge date: 05/20/2022  Admitted From: Home Disposition: Skilled nursing facility  Recommendations for Outpatient Follow-up:  Follow up with PCP in 1-2 weeks Please obtain BMP/CBC in one week Sending referral to urology for follow-up Consult palliative care on arrival to SNF.  Home Health: N/A Equipment/Devices: N/A  Discharge Condition: Stable CODE STATUS: DNR Diet recommendation: Regular diet, nutritional supplements, encourage hydration  Discharge summary:  81 year old gentleman with history of chronic anemia, recent pacemaker for complete heart block, failure to thrive and ambulatory dysfunction brought back to the emergency room with dizziness, confusion.  Recent worsening clinical status.  Declining ambulatory function and falling.  Also reported intermittent hematuria.     Assessment & Plan:   Syncope and collapse: Probably orthostatic, suspect vasovagal with hematuria.  Stabilized now.  Orthostatic negative. EEG normal. No focal logical deficit. Pacemaker is functioning well and no evidence of dysrhythmias.   Hypokalemia: Adequate.  Replaced.   Painless hematuria: Intermittent hematuria.  No gross bleeding noted. PSA normal. Urine culture collected, no growth so far. Gross urine examination with clear urine today.  No significant postvoid residual. CT scan of the abdomen pelvis with nonobstructive renal calculi.  4.5 cm left renal inferior pole cyst.  Urinary bladder unremarkable. Patient will need cystoscopy evaluation, will send to urology for follow-up as outpatient.   Complete heart block: Status post pacemaker.  Stable.   Failure to thrive Mild cognitive impairment Advanced physical debility   -Work with PT OT.  Rehab trial. -Continue palliative care follow-up.    Stable for discharge.  Sacral diabetes ulcer:  Stage II ulcer.  Local wound care.  Protective barrier.   Discharge Diagnoses:  Principal Problem:   Syncope and collapse Active Problems:   Complete heart block (HCC)   Hypokalemia   Hematuria   Essential hypertension   Tobacco abuse   Hyperlipidemia LDL goal <70   Pure hyperglyceridemia   Failure to thrive in adult   Syncope    Discharge Instructions  Discharge Instructions     Ambulatory referral to Urology   Complete by: As directed    Plz schedule a follow up at [facility] location   Diet general   Complete by: As directed    Discharge wound care:   Complete by: As directed    Offload pressure to heels with prevalon. CLeanse buttocks/sacral area with soap and water and pat dry.  Apply medihoney to open wounds. Cleanse the wound with NS. Pat dry then apply a nickel thick layer of MediHoney directly to the wound or onto a dressing. Next, cover with gauze and secure with sacral foam.   Change dressing daily.   Increase activity slowly   Complete by: As directed       Allergies as of 05/20/2022       Reactions   Penicillins Hives, Swelling   Has patient had a PCN reaction causing immediate rash, facial/tongue/throat swelling, SOB or lightheadedness with hypotension: Yes Has patient had a PCN reaction causing severe rash involving mucus membranes or skin necrosis: Yes Has patient had a PCN reaction that required hospitalization No Has patient had a PCN reaction occurring within the last 10 years: No If all of the above answers are "NO", then may proceed with Cephalosporin use.        Medication List     STOP taking these medications    fentaNYL 12 MCG/HR  Commonly known as: DURAGESIC   hydrochlorothiazide 12.5 MG capsule Commonly known as: MICROZIDE   thiamine 50 MG tablet Commonly known as: VITAMIN B-1       TAKE these medications    albuterol 108 (90 Base) MCG/ACT inhaler Commonly known as: VENTOLIN HFA Inhale 2 puffs into the lungs every 6  (six) hours as needed for wheezing or shortness of breath. What changed: Another medication with the same name was removed. Continue taking this medication, and follow the directions you see here.   aspirin EC 81 MG tablet Take 1 tablet (81 mg total) by mouth daily.   ezetimibe 10 MG tablet Commonly known as: ZETIA Take 1 tablet (10 mg total) by mouth daily.   mirtazapine 7.5 MG tablet Commonly known as: REMERON Take 1 tablet (7.5 mg total) by mouth at bedtime.   ONE-A-DAY 50 PLUS PO Take 1 tablet by mouth daily.   rosuvastatin 40 MG tablet Commonly known as: CRESTOR Take 1 tablet (40 mg total) by mouth daily.               Discharge Care Instructions  (From admission, onward)           Start     Ordered   05/20/22 0000  Discharge wound care:       Comments: Offload pressure to heels with prevalon. CLeanse buttocks/sacral area with soap and water and pat dry.  Apply medihoney to open wounds. Cleanse the wound with NS. Pat dry then apply a nickel thick layer of MediHoney directly to the wound or onto a dressing. Next, cover with gauze and secure with sacral foam.   Change dressing daily.   05/20/22 1029            Allergies  Allergen Reactions   Penicillins Hives and Swelling    Has patient had a PCN reaction causing immediate rash, facial/tongue/throat swelling, SOB or lightheadedness with hypotension: Yes Has patient had a PCN reaction causing severe rash involving mucus membranes or skin necrosis: Yes Has patient had a PCN reaction that required hospitalization No Has patient had a PCN reaction occurring within the last 10 years: No If all of the above answers are "NO", then may proceed with Cephalosporin use.     Consultations: None   Procedures/Studies: EEG adult  Result Date: 05-20-22 Lora Havens, MD     May 20, 2022  9:57 AM Patient Name: [name] MRN: [id number] Epilepsy Attending: Lora Havens Referring Physician/Provider: Karmen Bongo, MD Date: 2022-05-20 Duration: 25.19 mins Patient history: 81 year old male presented with syncope.  EEG to evaluate for seizure. Level of alertness: Awake, asleep AEDs during EEG study: None Technical aspects: This EEG study was done with scalp electrodes positioned according to the 10-20 International system of electrode placement. Electrical activity was acquired at a sampling rate of 500Hz  and reviewed with a high frequency filter of 70Hz  and a low frequency filter of 1Hz . EEG data were recorded continuously and digitally stored. Description: The posterior dominant rhythm consists of 9 Hz activity of moderate voltage (25-35 uV) seen predominantly in posterior head regions, symmetric and reactive to eye opening and eye closing. Sleep was characterized by vertex waves, sleep spindles (12 to 14 Hz), maximal frontocentral region.  Hyperventilation and photic stimulation were not performed.   IMPRESSION: This study is within normal limits. No seizures or epileptiform discharges were seen throughout the recording. Lora Havens   CT RENAL STONE STUDY  Result Date: 20-May-2022 CLINICAL DATA:  Flank pain.  Concern for kidney stone. EXAM: CT ABDOMEN AND PELVIS WITHOUT CONTRAST TECHNIQUE: Multidetector CT imaging of the abdomen and pelvis was performed following the standard protocol without IV contrast. RADIATION DOSE REDUCTION: This exam was performed according to the departmental dose-optimization program which includes automated exposure control, adjustment of the mA and/or kV according to patient size and/or use of iterative reconstruction technique. COMPARISON:  None Available. FINDINGS: Evaluation of this exam is limited in the absence of intravenous contrast as well as due to streak artifact caused by patient's arms. Lower chest: The visualized lung bases are clear. There is coronary vascular calcification and cardiac pacemaker wire. No intra-abdominal free air.  Small free fluid within the pelvis.  Hepatobiliary: The liver is unremarkable. No biliary dilatation. The gallbladder is unremarkable Pancreas: Small scattered pancreatic calcification, likely sequela of chronic pancreatitis. No active inflammatory changes. No dilatation of the main pancreatic duct or gland atrophy Spleen: Normal in size without focal abnormality. Adrenals/Urinary Tract: The adrenal glands are unremarkable. Multiple the the nonobstructing left renal calculi measure up to 5 mm in the inferior pole of the left kidney. Linear calcification in the right kidney may represent vascular calcification or nonobstructing calculi. There is no hydronephrosis on either side. There is a 4.5 cm left renal inferior pole cyst. Several additional bilateral hypodense lesions are not characterized on this CT. The this can be better evaluated with ultrasound on a nonemergent/outpatient basis. The urinary bladder is grossly unremarkable. Small pocket of air within the bladder may have been introduced by recent instrumentation. Correlation with urinalysis recommended to exclude cystitis. Stomach/Bowel: There is severe sigmoid diverticulosis without active inflammatory changes. There is moderate stool throughout the colon. There is no bowel obstruction. The appendix is normal as visualized. Vascular/Lymphatic: Advanced aortoiliac atherosclerotic disease. The IVC is unremarkable. No portal venous gas. There is no adenopathy. Reproductive: The prostate and seminal vesicles are grossly unremarkable. No pelvic mass. Other: There is diffuse subcutaneous edema.  No fluid collection. Musculoskeletal: Osteopenia with scoliosis and degenerative changes of the spine. The there is age indeterminate compression fracture of superior endplate of T12 with approximately the 30% loss of vertebral body height. Correlation with clinical exam and point tenderness recommended. IMPRESSION: 1. Nonobstructing left renal calculi. No hydronephrosis. 2. Vascular calcification versus  nonobstructing right renal stones. No hydronephrosis. 3. Severe sigmoid diverticulosis. No bowel obstruction. Normal appendix. 4. Age indeterminate compression fracture of superior endplate of T12. Correlation with clinical exam and point tenderness recommended. 5. Aortic Atherosclerosis (ICD10-I70.0). Electronically Signed   By: Elgie Collard M.D.   On: 05/16/2022 03:39   CT Cervical Spine Wo Contrast  Result Date: 05/15/2022 CLINICAL DATA:  Neck trauma (Age >= 65y) EXAM: CT CERVICAL SPINE WITHOUT CONTRAST TECHNIQUE: Multidetector CT imaging of the cervical spine was performed without intravenous contrast. Multiplanar CT image reconstructions were also generated. RADIATION DOSE REDUCTION: This exam was performed according to the departmental dose-optimization program which includes automated exposure control, adjustment of the mA and/or kV according to patient size and/or use of iterative reconstruction technique. COMPARISON:  None Available. FINDINGS: Alignment: Degenerative anterolisthesis at C7-T1 greater than C3-C4. Skull base and vertebrae: Degenerative plate irregularity. No acute fracture. Soft tissues and spinal canal: No prevertebral fluid or swelling. No visible canal hematoma. Disc levels: Multilevel degenerative changes are present including disc space narrowing, endplate osteophytes, and facet and uncovertebral hypertrophy. Upper chest: Emphysema. Other: Calcified plaque at the common carotid bifurcations. IMPRESSION: No acute cervical spine fracture. Electronically Signed   By: Jackquline Berlin.D.  On: 05/15/2022 08:51   CT HEAD WO CONTRAST (5MM)  Result Date: 05/15/2022 CLINICAL DATA:  Head trauma, minor (Age >= 65y) EXAM: CT HEAD WITHOUT CONTRAST TECHNIQUE: Contiguous axial images were obtained from the base of the skull through the vertex without intravenous contrast. RADIATION DOSE REDUCTION: This exam was performed according to the departmental dose-optimization program which includes  automated exposure control, adjustment of the mA and/or kV according to patient size and/or use of iterative reconstruction technique. COMPARISON:  04/27/2022 FINDINGS: Brain: There is no acute intracranial hemorrhage, mass effect, or edema. Gray-white differentiation is preserved. There is no extra-axial fluid collection. Ventricles and sulci are stable in size and configuration. Patchy hypoattenuation in the supratentorial white matter probably reflects stable chronic microvascular ischemic changes. Vascular: There is atherosclerotic calcification at the skull base. Skull: Calvarium is unremarkable. Sinuses/Orbits: No acute finding. Other: None. IMPRESSION: No evidence of acute intracranial injury. Electronically Signed   By: Macy Mis M.D.   On: 05/15/2022 08:43   DG Pelvis 1-2 Views  Result Date: 05/15/2022 CLINICAL DATA:  Near syncope and fall EXAM: PELVIS - 1-2 VIEW COMPARISON:  None Available. FINDINGS: There is no evidence of pelvic fracture or diastasis. No pelvic bone lesions are seen. IMPRESSION: Negative. Electronically Signed   By: Frazier Richards M.D.   On: 05/15/2022 08:20   DG Chest Portable 1 View  Result Date: 05/15/2022 CLINICAL DATA:  Near syncope EXAM: PORTABLE CHEST 1 VIEW COMPARISON:  05/03/2022 FINDINGS: Hyperinflated lungs. No new consolidation or edema. No pleural effusion or pneumothorax. Stable cardiomediastinal contours. Left chest wall dual lead pacemaker. IMPRESSION: No acute process in the chest. Electronically Signed   By: Macy Mis M.D.   On: 05/15/2022 08:16   DG Chest 2 View  Result Date: 05/03/2022 CLINICAL DATA:  Post pacemaker placement EXAM: CHEST - 2 VIEW COMPARISON:  10/04/2021 FINDINGS: New RIGHT subclavian sequential pacemaker with leads projecting at RIGHT atrium and RIGHT ventricle. Normal heart size, mediastinal contours, and pulmonary vascularity. Atherosclerotic calcification aorta. Emphysematous changes with biapical scarring. No acute infiltrate,  pleural effusion, or pneumothorax. Bones demineralized with probable BILATERAL chronic rotator cuff tears. IMPRESSION: New pacemaker without pneumothorax. COPD changes with biapical scarring. Aortic Atherosclerosis (ICD10-I70.0) and Emphysema (ICD10-J43.9). Electronically Signed   By: Lavonia Dana M.D.   On: 05/03/2022 08:23   EP PPM/ICD IMPLANT  Result Date: 05/02/2022  CONCLUSIONS:  1. Intermittent complete heart block and syncope  2. Dual chamber permanent pacemaker implant with left bundle area lead  3.  No early apparent complications.   ECHOCARDIOGRAM COMPLETE  Result Date: 04/28/2022    ECHOCARDIOGRAM REPORT   Patient Name:   [name] [facility] Date of Exam: 04/28/2022 Medical Rec #:  [id number]     Height:       71.0 in Accession #:    [id number]    Weight:       122.5 lb Date of Birth:  1941-10-11    BSA:          1.712 m Patient Age:    28 years      BP:           121/67 mmHg Patient Gender: M             HR:           55 bpm. Exam Location:  Inpatient Procedure: 2D Echo, Color Doppler and Cardiac Doppler Indications:    Stroke i63.9  History:        Patient has prior history  of Echocardiogram examinations, most                 recent 01/23/2017. COPD; Risk Factors:Hypertension and                 Dyslipidemia.  Sonographer:    Irving Burton Senior RDCS Referring Phys: [id number] DEVON SHAFER IMPRESSIONS  1. Left ventricular ejection fraction, by estimation, is 60 to 65%. The left ventricle has normal function. The left ventricle has no regional wall motion abnormalities. There is mild left ventricular hypertrophy. Left ventricular diastolic parameters are consistent with Grade II diastolic dysfunction (pseudonormalization).  2. Right ventricular systolic function is normal. The right ventricular size is normal.  3. The mitral valve is normal in structure. Mild mitral valve regurgitation. No evidence of mitral stenosis. Moderate mitral annular calcification.  4. The aortic valve is tricuspid. Aortic valve regurgitation  is not visualized. Aortic valve sclerosis is present, with no evidence of aortic valve stenosis.  5. The inferior vena cava is normal in size with greater than 50% respiratory variability, suggesting right atrial pressure of 3 mmHg. FINDINGS  Left Ventricle: Left ventricular ejection fraction, by estimation, is 60 to 65%. The left ventricle has normal function. The left ventricle has no regional wall motion abnormalities. The left ventricular internal cavity size was normal in size. There is  mild left ventricular hypertrophy. Left ventricular diastolic parameters are consistent with Grade II diastolic dysfunction (pseudonormalization). Right Ventricle: The right ventricular size is normal. Right ventricular systolic function is normal. Left Atrium: Left atrial size was normal in size. Right Atrium: Right atrial size was normal in size. Pericardium: There is no evidence of pericardial effusion. Mitral Valve: The mitral valve is normal in structure. Moderate mitral annular calcification. Mild mitral valve regurgitation. No evidence of mitral valve stenosis. Tricuspid Valve: The tricuspid valve is normal in structure. Tricuspid valve regurgitation is mild . No evidence of tricuspid stenosis. Aortic Valve: The aortic valve is tricuspid. Aortic valve regurgitation is not visualized. Aortic valve sclerosis is present, with no evidence of aortic valve stenosis. Pulmonic Valve: The pulmonic valve was normal in structure. Pulmonic valve regurgitation is trivial. No evidence of pulmonic stenosis. Aorta: The aortic root is normal in size and structure. Venous: The inferior vena cava is normal in size with greater than 50% respiratory variability, suggesting right atrial pressure of 3 mmHg. IAS/Shunts: No atrial level shunt detected by color flow Doppler.  LEFT VENTRICLE PLAX 2D LVIDd:         4.55 cm   Diastology LVIDs:         3.10 cm   LV e' medial:    7.18 cm/s LV PW:         1.15 cm   LV E/e' medial:  12.5 LV IVS:         1.05 cm   LV e' lateral:   7.40 cm/s LVOT diam:     1.95 cm   LV E/e' lateral: 12.2 LV SV:         80 LV SV Index:   47 LVOT Area:     2.99 cm  RIGHT VENTRICLE RV S prime:     12.50 cm/s TAPSE (M-mode): 2.0 cm LEFT ATRIUM             Index        RIGHT ATRIUM           Index LA diam:        3.80 cm 2.22 cm/m   RA  Area:     14.40 cm LA Vol (A2C):   40.5 ml 23.66 ml/m  RA Volume:   34.00 ml  19.86 ml/m LA Vol (A4C):   34.5 ml 20.15 ml/m LA Biplane Vol: 40.7 ml 23.77 ml/m  AORTIC VALVE LVOT Vmax:   106.00 cm/s LVOT Vmean:  78.200 cm/s LVOT VTI:    0.267 m  AORTA Ao Root diam: 3.25 cm MITRAL VALVE MV Area (PHT): 3.58 cm    SHUNTS MV Decel Time: 212 msec    Systemic VTI:  0.27 m MV E velocity: 90.10 cm/s  Systemic Diam: 1.95 cm MV A velocity: 79.70 cm/s MV E/A ratio:  1.13 Kirk Ruths MD Electronically signed by Kirk Ruths MD Signature Date/Time: 04/28/2022/4:04:41 PM    Final    MR BRAIN WO CONTRAST  Result Date: 04/27/2022 CLINICAL DATA:  TIA. New onset of leg weakness. Question left parietal infarct. EXAM: MRI HEAD WITHOUT CONTRAST TECHNIQUE: Multiplanar, multiecho pulse sequences of the brain and surrounding structures were obtained without intravenous contrast. COMPARISON:  CT head without contrast and CTA head and neck 04/27/2022 FINDINGS: Brain: Atrophy and white matter changes are moderately advanced for age. The ventricles are proportionate to the degree of atrophy. No acute infarct, hemorrhage, or mass lesion is present. Remote lacunar infarct is present along the posterior limb of the left external capsule. Basal ganglia are intact. Insular ribbon is normal. The internal auditory canals are within normal limits. The brainstem and cerebellum are within normal limits. Vascular: Flow is present in the major intracranial arteries. Skull and upper cervical spine: The craniocervical junction is normal. Upper cervical spine is within normal limits. Marrow signal is unremarkable. Sinuses/Orbits: Small  mastoid effusions are present. No obstructing nasopharyngeal lesion is present. The paranasal sinuses and mastoid air cells are otherwise clear. The globes and orbits are within normal limits. Other: IMPRESSION: 1. No acute intracranial abnormality. 2. Atrophy and white matter disease is moderately advanced for age. This likely reflects the sequela of chronic microvascular ischemia. 3. Remote lacunar infarct of the posterior limb of the left external capsule. Electronically Signed   By: San Morelle M.D.   On: 04/27/2022 18:27   CT ANGIO HEAD NECK W WO CM W PERF (CODE STROKE)  Result Date: 04/27/2022 CLINICAL DATA:  Code stroke. Question left parietal infarction. EXAM: CT ANGIOGRAPHY HEAD AND NECK CT PERFUSION BRAIN TECHNIQUE: Multidetector CT imaging of the head and neck was performed using the standard protocol during bolus administration of intravenous contrast. Multiplanar CT image reconstructions and MIPs were obtained to evaluate the vascular anatomy. Carotid stenosis measurements (when applicable) are obtained utilizing NASCET criteria, using the distal internal carotid diameter as the denominator. Multiphase CT imaging of the brain was performed following IV bolus contrast injection. Subsequent parametric perfusion maps were calculated using RAPID software. RADIATION DOSE REDUCTION: This exam was performed according to the departmental dose-optimization program which includes automated exposure control, adjustment of the mA and/or kV according to patient size and/or use of iterative reconstruction technique. CONTRAST:  179mL OMNIPAQUE IOHEXOL 350 MG/ML SOLN COMPARISON:  CT head without contrast 04/27/2022. FINDINGS: CTA NECK FINDINGS Aortic arch: Atherosclerotic calcifications are present at the aortic arch and great vessel origins without focal aneurysm or stenosis. Right carotid system: Atherosclerotic calcifications are present in the distal right common carotid artery. The right internal  carotid artery is occluded. No reconstitution in the neck. Left carotid system: The left common carotid artery demonstrates some distal calcification. Dense calcifications are present at the bifurcation. The  lumen is narrowed to 2.3 mm, less than 50% stenosis relative to the more normal distal left ICA. Vertebral arteries: The vertebral arteries are codominant. Both vertebral arteries originate from the subclavian arteries without significant stenosis. Moderate narrowing of up to 50% is present in the right vertebral artery as it enters the spinal canal at C6. No other significant focal stenosis is present in either vertebral artery in the neck. Skeleton: Degenerative changes are present in the cervical spine with grade 1 anterolisthesis at C3-4 and C4-5. Marked endplate changes are present at C5-6 and C6-7. No focal osseous lesions are present. The patient is edentulous. Other neck: Diffuse subcutaneous edema is present in the neck. No discrete lesion or abscess is present. No significant adenopathy is present. The submandibular and parotid glands and ducts are within normal limits. No focal mucosal lesions are present. The thyroid is normal. Upper chest: Centrilobular emphysematous changes are present. Scarring is present at both lung apices. Thoracic inlet is within normal limits. Review of the MIP images confirms the above findings CTA HEAD FINDINGS Anterior circulation: The right internal carotid artery is reconstituted at the level of the posterior communicating artery. Atherosclerotic calcifications are present within the cavernous left ICA without a significant stenosis relative to the ICA terminus. The left A1 segment is dominant. The anterior communicating artery is patent. The MCA scratched at the M1 segments are normal bilaterally. MCA bifurcations are within normal limits. The ACA and MCA branch vessels are within normal limits. Posterior circulation: Moderate stenosis is present at the dural margin of  the right vertebral artery. The intracranial left vertebral artery is the dominant vessel. PICA origins are visualized and normal. Vertebrobasilar junction is normal. Basilar artery is normal. Both posterior cerebral arteries originate from basilar tip. Moderate stenosis is present in the proximal left posterior cerebral artery. Pial collateral vessels contribute. Venous sinuses: The dural sinuses are patent. The straight sinus deep cerebral veins are intact. Cortical veins are within normal limits. No significant vascular malformation is evident. Anatomic variants: Prominent right posterior communicating artery reconstituting the right ICA. Review of the MIP images confirms the above findings CT Brain Perfusion Findings: ASPECTS: 10/10 CBF (<30%) Volume: 46mL Perfusion (Tmax>6.0s) volume: 69mL Mismatch Volume: 45mL Infarction Location:N/A IMPRESSION: 1. CT perfusion demonstrates no acute infarct or significant area of ischemia. 2. 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 3. Less than 50% stenosis of the left internal carotid artery at the bifurcation. 4. Moderate stenosis of up to 70% in the right vertebral artery as it enters the spinal canal at C6. 5. Moderate stenosis of the proximal left posterior cerebral artery with pial collaterals filling the territory. 6. Aortic Atherosclerosis (ICD10-I70.0) and Emphysema (ICD10-J43.9). Electronically Signed   By: San Morelle M.D.   On: 04/27/2022 12:36   CT HEAD CODE STROKE WO CONTRAST`  Result Date: 04/27/2022 CLINICAL DATA:  Code stroke.  New onset of leg weakness. Fell. EXAM: CT HEAD WITHOUT CONTRAST TECHNIQUE: Contiguous axial images were obtained from the base of the skull through the vertex without intravenous contrast. RADIATION DOSE REDUCTION: This exam was performed according to the departmental dose-optimization program which includes automated exposure  control, adjustment of the mA and/or kV according to patient size and/or use of iterative reconstruction technique. COMPARISON:  None Available. FINDINGS: Brain: No acute infarct, hemorrhage, or mass lesion is present. Moderate atrophy and white matter changes are present bilaterally. Basal ganglia are intact. Insular  ribbon is normal. No acute or focal cortical abnormality is present. The ventricles are proportionate to the degree of atrophy. No significant extraaxial fluid collection is present. The brainstem and cerebellum are within normal limits. Vascular: Atherosclerotic calcifications are present within the cavernous internal carotid arteries. Hyperdense vessel is present. Skull: Calvarium is intact. No focal lytic or blastic lesions are present. No significant extracranial soft tissue lesion is present. Sinuses/Orbits: Shrunken maxillary sinuses are present bilaterally. No acute sinus disease is present. The globes and orbits are within normal limits. ASPECTS [facility] [place] Stroke Program Early CT Score) - Ganglionic level infarction (caudate, lentiform nuclei, internal capsule, insula, M1-M3 cortex): 7/7 - Supraganglionic infarction (M4-M6 cortex): 3/3 Total score (0-10 with 10 being normal): 10/10 IMPRESSION: 1. No acute intracranial abnormality. 2. Moderate atrophy and white matter disease likely reflects the sequela of chronic microvascular ischemia. 3. ASPECTS is 10/10. * Electronically Signed   By: San Morelle M.D.   On: 04/27/2022 12:03   (Echo, Carotid, EGD, Colonoscopy, ERCP)    Subjective: Patient seen and examined.  Denies any complaints.  Agreeable to go to rehab.   Discharge Exam: Vitals:   05/19/22 2030 05/20/22 0509  BP: 108/64 115/64  Pulse: 80 83  Resp: 16 16  Temp: 97.7 F (36.5 C) 97.6 F (36.4 C)  SpO2: 95%    Vitals:   05/19/22 1312 05/19/22 1539 05/19/22 2030 05/20/22 0509  BP: (!) 105/57 (!) 114/59 108/64 115/64  Pulse: 92 87 80 83  Resp: 18 18 16 16   Temp: 98  F (36.7 C) 98.6 F (37 C) 97.7 F (36.5 C) 97.6 F (36.4 C)  TempSrc: Oral Oral Oral Oral  SpO2: 96% 98% 95%   Weight:    48.9 kg  Height:        General: Pt is alert, awake, not in acute distress Frail and debilitated.  Not in any distress.  Eating breakfast.  Flat affect. Cardiovascular: RRR, S1/S2 +, no rubs, no gallops, pacemaker left precordium. Respiratory: CTA bilaterally, no wheezing, no rhonchi Abdominal: Soft, NT, ND, bowel sounds + Extremities: no edema, no cyanosis    The results of significant diagnostics from this hospitalization (including imaging, microbiology, ancillary and laboratory) are listed below for reference.     Microbiology: Recent Results (from the past 240 hour(s))  Resp Panel by RT-PCR (Flu A&B, Covid) Anterior Nasal Swab     Status: None   Collection Time: 05/15/22  2:53 PM   Specimen: Anterior Nasal Swab  Result Value Ref Range Status   SARS Coronavirus 2 by RT PCR NEGATIVE NEGATIVE Final    Comment: (NOTE) SARS-CoV-2 target nucleic acids are NOT DETECTED.  The SARS-CoV-2 RNA is generally detectable in upper respiratory specimens during the acute phase of infection. The lowest concentration of SARS-CoV-2 viral copies this assay can detect is 138 copies/mL. A negative result does not preclude SARS-Cov-2 infection and should not be used as the sole basis for treatment or other patient management decisions. A negative result may occur with  improper specimen collection/handling, submission of specimen other than nasopharyngeal swab, presence of viral mutation(s) within the areas targeted by this assay, and inadequate number of viral copies(<138 copies/mL). A negative result must be combined with clinical observations, patient history, and epidemiological information. The expected result is Negative.  Fact Sheet for Patients:  EntrepreneurPulse.com.au  Fact Sheet for Healthcare Providers:   IncredibleEmployment.be  This test is no t yet approved or cleared by the [place] and  has been authorized for  detection and/or diagnosis of SARS-CoV-2 by FDA under an Emergency Use Authorization (EUA). This EUA will remain  in effect (meaning this test can be used) for the duration of the COVID-19 declaration under Section 564(b)(1) of the Act, 21 U.S.C.section 360bbb-3(b)(1), unless the authorization is terminated  or revoked sooner.       Influenza A by PCR NEGATIVE NEGATIVE Final   Influenza B by PCR NEGATIVE NEGATIVE Final    Comment: (NOTE) The Xpert Xpress SARS-CoV-2/FLU/RSV plus assay is intended as an aid in the diagnosis of influenza from Nasopharyngeal swab specimens and should not be used as a sole basis for treatment. Nasal washings and aspirates are unacceptable for Xpert Xpress SARS-CoV-2/FLU/RSV testing.  Fact Sheet for Patients: EntrepreneurPulse.com.au  Fact Sheet for Healthcare Providers: IncredibleEmployment.be  This test is not yet approved or cleared by the [place] FDA and has been authorized for detection and/or diagnosis of SARS-CoV-2 by FDA under an Emergency Use Authorization (EUA). This EUA will remain in effect (meaning this test can be used) for the duration of the COVID-19 declaration under Section 564(b)(1) of the Act, 21 U.S.C. section 360bbb-3(b)(1), unless the authorization is terminated or revoked.  Performed at [facility], [place] [address], [place], [place] [address]      Labs: BNP (last 3 results) No results for input(s): "BNP" in the last 8760 hours. Basic Metabolic Panel: Recent Labs  Lab 05/15/22 0742 05/16/22 0211 05/17/22 1005 05/18/22 0209  NA 139 139 138 133*  K 2.9* 3.0* 3.4* 3.7  CL 98 101 102 102  CO2 30 30 28 26   GLUCOSE 122* 90 127* 111*  BUN 20 20 21  28*  CREATININE 0.98 0.93 1.04 0.92  CALCIUM 9.3 8.9 9.2 8.7*  MG 1.8 1.8  --   --     Liver Function Tests: Recent Labs  Lab 05/15/22 0742  AST 28  ALT 20  ALKPHOS 72  BILITOT 0.6  PROT 6.1*  ALBUMIN 2.8*   No results for input(s): "LIPASE", "AMYLASE" in the last 168 hours. No results for input(s): "AMMONIA" in the last 168 hours. CBC: Recent Labs  Lab 05/15/22 0742 05/16/22 0211 05/17/22 1005 05/18/22 0209  WBC 13.8* 7.8 10.0 7.6  NEUTROABS 10.7*  --   --   --   HGB 12.5* 10.5* 11.1* 10.1*  HCT 37.1* 31.8* 33.9* 30.4*  MCV 95.4 95.2 95.8 95.9  PLT 348 297 292 249   Cardiac Enzymes: No results for input(s): "CKTOTAL", "CKMB", "CKMBINDEX", "TROPONINI" in the last 168 hours. BNP: Invalid input(s): "POCBNP" CBG: Recent Labs  Lab 05/15/22 0741  GLUCAP 127*   D-Dimer No results for input(s): "DDIMER" in the last 72 hours. Hgb A1c No results for input(s): "HGBA1C" in the last 72 hours. Lipid Profile No results for input(s): "CHOL", "HDL", "LDLCALC", "TRIG", "CHOLHDL", "LDLDIRECT" in the last 72 hours. Thyroid function studies No results for input(s): "TSH", "T4TOTAL", "T3FREE", "THYROIDAB" in the last 72 hours.  Invalid input(s): "FREET3" Anemia work up No results for input(s): "VITAMINB12", "FOLATE", "FERRITIN", "TIBC", "IRON", "RETICCTPCT" in the last 72 hours. Urinalysis    Component Value Date/Time   COLORURINE RED (A) 05/15/2022 2250   APPEARANCEUR TURBID (A) 05/15/2022 2250   LABSPEC  05/15/2022 2250    TEST NOT REPORTED DUE TO COLOR INTERFERENCE OF URINE PIGMENT   PHURINE  05/15/2022 2250    TEST NOT REPORTED DUE TO COLOR INTERFERENCE OF URINE PIGMENT   GLUCOSEU (A) 05/15/2022 2250    TEST NOT REPORTED DUE TO  COLOR INTERFERENCE OF URINE PIGMENT   GLUCOSEU NEGATIVE 08/16/2021 1439   HGBUR (A) 05/15/2022 2250    TEST NOT REPORTED DUE TO COLOR INTERFERENCE OF URINE PIGMENT   BILIRUBINUR (A) 05/15/2022 2250    TEST NOT REPORTED DUE TO COLOR INTERFERENCE OF URINE PIGMENT   KETONESUR (A) 05/15/2022 2250    TEST NOT REPORTED DUE TO COLOR  INTERFERENCE OF URINE PIGMENT   PROTEINUR (A) 05/15/2022 2250    TEST NOT REPORTED DUE TO COLOR INTERFERENCE OF URINE PIGMENT   UROBILINOGEN 1.0 08/16/2021 1439   NITRITE (A) 05/15/2022 2250    TEST NOT REPORTED DUE TO COLOR INTERFERENCE OF URINE PIGMENT   LEUKOCYTESUR (A) 05/15/2022 2250    TEST NOT REPORTED DUE TO COLOR INTERFERENCE OF URINE PIGMENT   Sepsis Labs Recent Labs  Lab 05/15/22 0742 05/16/22 0211 05/17/22 1005 05/18/22 0209  WBC 13.8* 7.8 10.0 7.6   Microbiology Recent Results (from the past 240 hour(s))  Resp Panel by RT-PCR (Flu A&B, Covid) Anterior Nasal Swab     Status: None   Collection Time: 05/15/22  2:53 PM   Specimen: Anterior Nasal Swab  Result Value Ref Range Status   SARS Coronavirus 2 by RT PCR NEGATIVE NEGATIVE Final    Comment: (NOTE) SARS-CoV-2 target nucleic acids are NOT DETECTED.  The SARS-CoV-2 RNA is generally detectable in upper respiratory specimens during the acute phase of infection. The lowest concentration of SARS-CoV-2 viral copies this assay can detect is 138 copies/mL. A negative result does not preclude SARS-Cov-2 infection and should not be used as the sole basis for treatment or other patient management decisions. A negative result may occur with  improper specimen collection/handling, submission of specimen other than nasopharyngeal swab, presence of viral mutation(s) within the areas targeted by this assay, and inadequate number of viral copies(<138 copies/mL). A negative result must be combined with clinical observations, patient history, and epidemiological information. The expected result is Negative.  Fact Sheet for Patients:  BloggerCourse.com  Fact Sheet for Healthcare Providers:  SeriousBroker.it  This test is no t yet approved or cleared by the [place] FDA and  has been authorized for detection and/or diagnosis of SARS-CoV-2 by FDA under an Emergency Use  Authorization (EUA). This EUA will remain  in effect (meaning this test can be used) for the duration of the COVID-19 declaration under Section 564(b)(1) of the Act, 21 U.S.C.section 360bbb-3(b)(1), unless the authorization is terminated  or revoked sooner.       Influenza A by PCR NEGATIVE NEGATIVE Final   Influenza B by PCR NEGATIVE NEGATIVE Final    Comment: (NOTE) The Xpert Xpress SARS-CoV-2/FLU/RSV plus assay is intended as an aid in the diagnosis of influenza from Nasopharyngeal swab specimens and should not be used as a sole basis for treatment. Nasal washings and aspirates are unacceptable for Xpert Xpress SARS-CoV-2/FLU/RSV testing.  Fact Sheet for Patients: BloggerCourse.com  Fact Sheet for Healthcare Providers: SeriousBroker.it  This test is not yet approved or cleared by the [place] FDA and has been authorized for detection and/or diagnosis of SARS-CoV-2 by FDA under an Emergency Use Authorization (EUA). This EUA will remain in effect (meaning this test can be used) for the duration of the COVID-19 declaration under Section 564(b)(1) of the Act, 21 U.S.C. section 360bbb-3(b)(1), unless the authorization is terminated or revoked.  Performed at [facility], [address] [place], [place] [address]      Time coordinating discharge: 32 minutes  SIGNED:   Lyndel Safe  Sheniece Ruggles, MD  [facility] 05/20/2022, 10:30 AM

## 2022-05-20 NOTE — Care Management Important Message (Signed)
Important Message  Patient Details  Name: [name] MRN: [id number] Date of Birth: Dec 20, 1940   Medicare Important Message Given:  Yes     Renie Ora 05/20/2022, 12:12 PM

## 2022-05-20 NOTE — TOC Transition Note (Signed)
Transition of Care [facility]) - CM/SW Discharge Note   Patient Details  Name: [name] MRN: [id number] Date of Birth: 01-11-1941  Transition of Care [facility]) CM/SW Contact:  Delilah Shan, LCSWA Phone Number: 05/20/2022, 11:15 AM   Clinical Narrative:     Patient will DC to: [facility]   Anticipated DC date: 05/20/2022  Family notified: [name]   Transport by: Sharin Mons  ?  Per MD patient ready for DC to [facility] with palliative services to follow. RN, patient, Danford Bad with [facility],patient's family, and facility notified of DC. Discharge Summary sent to facility. RN given number for report tele# [phone] ask for nurse on [facility]. DC packet on chart. DNR signed by MD attached to patients DC packet.Ambulance transport requested for patient.  CSW signing off.   Final next level of care: Skilled Nursing Facility Barriers to Discharge: No Barriers Identified   Patient Goals and CMS Choice Patient states their goals for this hospitalization and ongoing recovery are:: SNF CMS Medicare.gov Compare Post Acute Care list provided to:: Patient Represenative (must comment) (patient and patients daughter [name]) Choice offered to / list presented to : Patient, Adult Children (Patient and patients daughter [name])  Discharge Placement              Patient chooses bed at: [facility] Patient to be transferred to facility by: [facility] Name of family member notified: [name] Patient and family notified of of transfer: 05/20/22  Discharge Plan and Services In-house Referral: Clinical Social Work                                   Social Determinants of Health (SDOH) Interventions     Readmission Risk Interventions     No data to display

## 2022-05-22 DIAGNOSIS — L89322 Pressure ulcer of left buttock, stage 2: Secondary | ICD-10-CM | POA: Diagnosis not present

## 2022-05-22 DIAGNOSIS — R55 Syncope and collapse: Secondary | ICD-10-CM | POA: Diagnosis not present

## 2022-05-22 DIAGNOSIS — L89621 Pressure ulcer of left heel, stage 1: Secondary | ICD-10-CM | POA: Diagnosis not present

## 2022-05-22 DIAGNOSIS — L89312 Pressure ulcer of right buttock, stage 2: Secondary | ICD-10-CM | POA: Diagnosis not present

## 2022-05-23 DIAGNOSIS — D649 Anemia, unspecified: Secondary | ICD-10-CM | POA: Diagnosis not present

## 2022-05-23 DIAGNOSIS — Z043 Encounter for examination and observation following other accident: Secondary | ICD-10-CM | POA: Diagnosis not present

## 2022-05-23 DIAGNOSIS — E8809 Other disorders of plasma-protein metabolism, not elsewhere classified: Secondary | ICD-10-CM | POA: Diagnosis not present

## 2022-05-27 DIAGNOSIS — L89322 Pressure ulcer of left buttock, stage 2: Secondary | ICD-10-CM | POA: Diagnosis not present

## 2022-05-27 DIAGNOSIS — L89312 Pressure ulcer of right buttock, stage 2: Secondary | ICD-10-CM | POA: Diagnosis not present

## 2022-05-29 ENCOUNTER — Non-Acute Institutional Stay: Payer: Medicare Other | Admitting: *Deleted

## 2022-05-29 ENCOUNTER — Non-Acute Institutional Stay: Payer: Medicare Other

## 2022-05-29 DIAGNOSIS — Z515 Encounter for palliative care: Secondary | ICD-10-CM

## 2022-05-29 NOTE — Progress Notes (Signed)
COMMUNITY PALLIATIVE CARE SW NOTE  PATIENT NAME: [name] DOB: 1941-06-21 MRN: [id number]  PRIMARY CARE PROVIDER: Etta Grandchild, MD  RESPONSIBLE PARTY:  Acct ID - Guarantor Home Phone Work Phone Relationship Acct Type  [id number] - [place]* [phone]  Self P/F     [address] [place], [place] [address]   SOCIAL WORK ENCOUNTER  SW and RN-M. Dimas Aguas completed a visit with patient at [facility]. Patient was alert and oriented to self and situation. He was cordial and responsive to the. Patient was getting ready to eat lunch. He denied any pain. SW observed that patient could feed himself, but had difficulty lifting is right arm. Patient reported that he is working with therapy and he felt he was progressing well. He ambulates with a walker. Patient verbalized no concerns.  The team consulted with the unit manager-Margaret, RN and LPN-Kerri who provided a status update on patient. Since patient was admitted to the facility for rehab on 05/20/22, patient is having difficulty following commands as he is forgetful. Patient is eating between 50-75% of her his meals, but they report this is a decline in intake overall. Patient ambulates with a walker, but have to be reminded to use it. Patient had a fall last week, but did not sustain any injuries. He is leaning more to the right. He is receiving physical therapy and is progressing well. Patient's family is active and visit with patient regularly.  Patient and facility staff appear open to ongoing palliative care support by the team.  No other concerns noted.    CODE STATUS: DNR, on chart ADVANCED DIRECTIVES: No MOST FORM COMPLETE: No HOSPICE EDUCATION PROVIDED: No  Duration of visit and documentation: 60 minutes  Shenika Quint, LCSW

## 2022-05-29 NOTE — Progress Notes (Addendum)
[facility] COMMUNITY PALLIATIVE CARE RN NOTE  PATIENT NAME: [name] DOB: [birth year] MRN: [id number]  PRIMARY CARE PROVIDER: Janith Lima, MD  RESPONSIBLE PARTY: Casimer Lanius (daughter) Acct ID - Guarantor Home Phone Work Phone Relationship Acct Type  [id number] [name], [name]* [phone]  Self P/F     [address] [place], [place] [address]   RN/SW visit completed for initial palliative care consult visit. Met with patient in his room at [facility]. Patient was admitted to the facility on 05/20/2022. Consulted with RN unit Investment banker, corporate.  Cognitive: Patient is forgetful with a poor short term memory. He is pleasant and engaging. Staff reports intermittent confusion and inability to follow commands at times. His wife died a year ago and staff says he has become deeply depressed and feels that he is giving up.  Pain: He denies pain or discomfort. No physical indicators of pain noted.  Respiratory: No issues observed or reported  Cardiovascular: Patient has a pacemaker that was placed last month. He currently has steri strips in place which are intact. Staff reports that patient experienced a syncope episode after the pacemaker was placed while in the hospital. No occurrences while at the facility.  Mobility: Patient is ambulatory using a walker. He often forgets to utilize his walker and requires frequent reminders. They have now started to park his walker beside his bed at night so when he gets up he feels it  to hopefully remind him to use it. They have placed gripper socks on patient. Since being at the SNF, he has had one fall in the bathroom without injury. Requires assistance with bathing and dressing. Currently working with therapy and staff reports he is progressing. He does lean and veer to the right when walking.   Appetite: Staff reports that patient is eating about 50-75% of meals. No dysphagia. He is on a regular diet and was about to  eat lunch upon [place] entering the room. Takes his medications whole with water.  Skin: When he first came to the facility he had a 0.5cm x 0.5 cm Stage 2 wound to his sacrum. Area is now closed with some redness. They are applying Zinc oxide and covering with a protective dressing.  GI/GU: He is continent of both bowel and bladder. Staff reports that he loves to drink coffee so has to take several trips to the bathroom during the night.  Goals: At this time they are unsure of his goals other than to gain more strength with therapy.    HISTORY OF PRESENT ILLNESS: This is a 81 yo male with a history of chronic anemia, recent pacemaker for complete heart block and failure to thrive. He was recently hospitalized from 05/15/22 to 05/20/22 due to dizziness, confusion, worsening clinical status, ambulatory decline and falls, along with intermittent hematuria at home. He was discharged to SNF for rehab. Palliative care team was asked to follow patient to assist with symptom management, goals of care and complex decision making.  CODE STATUS: DNR ADVANCED DIRECTIVES: Y MOST FORM: no PPS: 50%   PHYSICAL EXAM:   LUNGS: clear to auscultation  CARDIAC: Cor RRR EXTREMITIES: No edema SKIN:  See above note   NEURO:  Alert and oriented to person/place, forgetful, pleasant mood, ambulatory w/walker   Daryl Eastern, RN BSN

## 2022-05-30 DIAGNOSIS — R945 Abnormal results of liver function studies: Secondary | ICD-10-CM | POA: Diagnosis not present

## 2022-05-30 DIAGNOSIS — E8809 Other disorders of plasma-protein metabolism, not elsewhere classified: Secondary | ICD-10-CM | POA: Diagnosis not present

## 2022-05-30 DIAGNOSIS — D649 Anemia, unspecified: Secondary | ICD-10-CM | POA: Diagnosis not present

## 2022-06-03 ENCOUNTER — Telehealth: Payer: Self-pay | Admitting: Internal Medicine

## 2022-06-03 DIAGNOSIS — M1990 Unspecified osteoarthritis, unspecified site: Secondary | ICD-10-CM | POA: Diagnosis not present

## 2022-06-03 NOTE — Telephone Encounter (Signed)
FYI - Patient is going into Hospice - Hospice doctor will become attending physician unless you have a problem with this.  If you had rather continue as attending please let Genevieve Norlander know.  If if is ok for the Hospice doctor to take over you do not have to do anything.

## 2022-06-05 DIAGNOSIS — D649 Anemia, unspecified: Secondary | ICD-10-CM | POA: Diagnosis not present

## 2022-06-05 DIAGNOSIS — F329 Major depressive disorder, single episode, unspecified: Secondary | ICD-10-CM | POA: Diagnosis not present

## 2022-06-05 DIAGNOSIS — J449 Chronic obstructive pulmonary disease, unspecified: Secondary | ICD-10-CM | POA: Diagnosis not present

## 2022-06-05 DIAGNOSIS — E785 Hyperlipidemia, unspecified: Secondary | ICD-10-CM | POA: Diagnosis not present

## 2022-06-10 DIAGNOSIS — I2510 Atherosclerotic heart disease of native coronary artery without angina pectoris: Secondary | ICD-10-CM | POA: Diagnosis not present

## 2022-06-10 DIAGNOSIS — Z66 Do not resuscitate: Secondary | ICD-10-CM | POA: Diagnosis not present

## 2022-06-10 DIAGNOSIS — J449 Chronic obstructive pulmonary disease, unspecified: Secondary | ICD-10-CM | POA: Diagnosis not present

## 2022-06-10 DIAGNOSIS — I219 Acute myocardial infarction, unspecified: Secondary | ICD-10-CM | POA: Diagnosis not present

## 2022-06-10 DIAGNOSIS — E119 Type 2 diabetes mellitus without complications: Secondary | ICD-10-CM | POA: Diagnosis not present

## 2022-06-10 DIAGNOSIS — I10 Essential (primary) hypertension: Secondary | ICD-10-CM | POA: Diagnosis not present

## 2022-06-10 DIAGNOSIS — I119 Hypertensive heart disease without heart failure: Secondary | ICD-10-CM | POA: Diagnosis not present

## 2022-06-10 DIAGNOSIS — Z950 Presence of cardiac pacemaker: Secondary | ICD-10-CM | POA: Diagnosis not present

## 2022-06-14 DIAGNOSIS — J449 Chronic obstructive pulmonary disease, unspecified: Secondary | ICD-10-CM | POA: Diagnosis not present

## 2022-06-14 DIAGNOSIS — I219 Acute myocardial infarction, unspecified: Secondary | ICD-10-CM | POA: Diagnosis not present

## 2022-06-14 DIAGNOSIS — I119 Hypertensive heart disease without heart failure: Secondary | ICD-10-CM | POA: Diagnosis not present

## 2022-06-14 DIAGNOSIS — E119 Type 2 diabetes mellitus without complications: Secondary | ICD-10-CM | POA: Diagnosis not present

## 2022-06-14 DIAGNOSIS — I2510 Atherosclerotic heart disease of native coronary artery without angina pectoris: Secondary | ICD-10-CM | POA: Diagnosis not present

## 2022-06-14 DIAGNOSIS — I10 Essential (primary) hypertension: Secondary | ICD-10-CM | POA: Diagnosis not present

## 2022-06-18 DIAGNOSIS — I219 Acute myocardial infarction, unspecified: Secondary | ICD-10-CM | POA: Diagnosis not present

## 2022-06-18 DIAGNOSIS — I2510 Atherosclerotic heart disease of native coronary artery without angina pectoris: Secondary | ICD-10-CM | POA: Diagnosis not present

## 2022-06-18 DIAGNOSIS — E119 Type 2 diabetes mellitus without complications: Secondary | ICD-10-CM | POA: Diagnosis not present

## 2022-06-18 DIAGNOSIS — J449 Chronic obstructive pulmonary disease, unspecified: Secondary | ICD-10-CM | POA: Diagnosis not present

## 2022-06-18 DIAGNOSIS — I10 Essential (primary) hypertension: Secondary | ICD-10-CM | POA: Diagnosis not present

## 2022-06-18 DIAGNOSIS — I119 Hypertensive heart disease without heart failure: Secondary | ICD-10-CM | POA: Diagnosis not present

## 2022-06-21 DIAGNOSIS — J449 Chronic obstructive pulmonary disease, unspecified: Secondary | ICD-10-CM | POA: Diagnosis not present

## 2022-06-21 DIAGNOSIS — I10 Essential (primary) hypertension: Secondary | ICD-10-CM | POA: Diagnosis not present

## 2022-06-21 DIAGNOSIS — I119 Hypertensive heart disease without heart failure: Secondary | ICD-10-CM | POA: Diagnosis not present

## 2022-06-21 DIAGNOSIS — I2510 Atherosclerotic heart disease of native coronary artery without angina pectoris: Secondary | ICD-10-CM | POA: Diagnosis not present

## 2022-06-21 DIAGNOSIS — E119 Type 2 diabetes mellitus without complications: Secondary | ICD-10-CM | POA: Diagnosis not present

## 2022-06-21 DIAGNOSIS — I219 Acute myocardial infarction, unspecified: Secondary | ICD-10-CM | POA: Diagnosis not present

## 2022-06-25 DIAGNOSIS — I219 Acute myocardial infarction, unspecified: Secondary | ICD-10-CM | POA: Diagnosis not present

## 2022-06-25 DIAGNOSIS — I10 Essential (primary) hypertension: Secondary | ICD-10-CM | POA: Diagnosis not present

## 2022-06-25 DIAGNOSIS — J449 Chronic obstructive pulmonary disease, unspecified: Secondary | ICD-10-CM | POA: Diagnosis not present

## 2022-06-25 DIAGNOSIS — I2510 Atherosclerotic heart disease of native coronary artery without angina pectoris: Secondary | ICD-10-CM | POA: Diagnosis not present

## 2022-06-25 DIAGNOSIS — I119 Hypertensive heart disease without heart failure: Secondary | ICD-10-CM | POA: Diagnosis not present

## 2022-06-25 DIAGNOSIS — E119 Type 2 diabetes mellitus without complications: Secondary | ICD-10-CM | POA: Diagnosis not present

## 2022-06-27 DIAGNOSIS — I10 Essential (primary) hypertension: Secondary | ICD-10-CM | POA: Diagnosis not present

## 2022-06-27 DIAGNOSIS — I219 Acute myocardial infarction, unspecified: Secondary | ICD-10-CM | POA: Diagnosis not present

## 2022-06-27 DIAGNOSIS — I2510 Atherosclerotic heart disease of native coronary artery without angina pectoris: Secondary | ICD-10-CM | POA: Diagnosis not present

## 2022-06-27 DIAGNOSIS — I119 Hypertensive heart disease without heart failure: Secondary | ICD-10-CM | POA: Diagnosis not present

## 2022-06-27 DIAGNOSIS — J449 Chronic obstructive pulmonary disease, unspecified: Secondary | ICD-10-CM | POA: Diagnosis not present

## 2022-06-27 DIAGNOSIS — E119 Type 2 diabetes mellitus without complications: Secondary | ICD-10-CM | POA: Diagnosis not present

## 2022-06-28 DIAGNOSIS — I219 Acute myocardial infarction, unspecified: Secondary | ICD-10-CM | POA: Diagnosis not present

## 2022-06-28 DIAGNOSIS — J449 Chronic obstructive pulmonary disease, unspecified: Secondary | ICD-10-CM | POA: Diagnosis not present

## 2022-06-28 DIAGNOSIS — I2510 Atherosclerotic heart disease of native coronary artery without angina pectoris: Secondary | ICD-10-CM | POA: Diagnosis not present

## 2022-06-28 DIAGNOSIS — Z66 Do not resuscitate: Secondary | ICD-10-CM | POA: Diagnosis not present

## 2022-06-28 DIAGNOSIS — I119 Hypertensive heart disease without heart failure: Secondary | ICD-10-CM | POA: Diagnosis not present

## 2022-06-28 DIAGNOSIS — I10 Essential (primary) hypertension: Secondary | ICD-10-CM | POA: Diagnosis not present

## 2022-06-28 DIAGNOSIS — E119 Type 2 diabetes mellitus without complications: Secondary | ICD-10-CM | POA: Diagnosis not present

## 2022-06-28 DIAGNOSIS — Z950 Presence of cardiac pacemaker: Secondary | ICD-10-CM | POA: Diagnosis not present

## 2022-07-02 DIAGNOSIS — I10 Essential (primary) hypertension: Secondary | ICD-10-CM | POA: Diagnosis not present

## 2022-07-02 DIAGNOSIS — J449 Chronic obstructive pulmonary disease, unspecified: Secondary | ICD-10-CM | POA: Diagnosis not present

## 2022-07-02 DIAGNOSIS — E119 Type 2 diabetes mellitus without complications: Secondary | ICD-10-CM | POA: Diagnosis not present

## 2022-07-02 DIAGNOSIS — I219 Acute myocardial infarction, unspecified: Secondary | ICD-10-CM | POA: Diagnosis not present

## 2022-07-02 DIAGNOSIS — I119 Hypertensive heart disease without heart failure: Secondary | ICD-10-CM | POA: Diagnosis not present

## 2022-07-02 DIAGNOSIS — I2510 Atherosclerotic heart disease of native coronary artery without angina pectoris: Secondary | ICD-10-CM | POA: Diagnosis not present

## 2022-07-03 DIAGNOSIS — I219 Acute myocardial infarction, unspecified: Secondary | ICD-10-CM | POA: Diagnosis not present

## 2022-07-03 DIAGNOSIS — I2510 Atherosclerotic heart disease of native coronary artery without angina pectoris: Secondary | ICD-10-CM | POA: Diagnosis not present

## 2022-07-03 DIAGNOSIS — E119 Type 2 diabetes mellitus without complications: Secondary | ICD-10-CM | POA: Diagnosis not present

## 2022-07-03 DIAGNOSIS — J449 Chronic obstructive pulmonary disease, unspecified: Secondary | ICD-10-CM | POA: Diagnosis not present

## 2022-07-03 DIAGNOSIS — I119 Hypertensive heart disease without heart failure: Secondary | ICD-10-CM | POA: Diagnosis not present

## 2022-07-03 DIAGNOSIS — I10 Essential (primary) hypertension: Secondary | ICD-10-CM | POA: Diagnosis not present

## 2022-07-05 DIAGNOSIS — E119 Type 2 diabetes mellitus without complications: Secondary | ICD-10-CM | POA: Diagnosis not present

## 2022-07-05 DIAGNOSIS — I2510 Atherosclerotic heart disease of native coronary artery without angina pectoris: Secondary | ICD-10-CM | POA: Diagnosis not present

## 2022-07-05 DIAGNOSIS — I10 Essential (primary) hypertension: Secondary | ICD-10-CM | POA: Diagnosis not present

## 2022-07-05 DIAGNOSIS — I119 Hypertensive heart disease without heart failure: Secondary | ICD-10-CM | POA: Diagnosis not present

## 2022-07-05 DIAGNOSIS — J449 Chronic obstructive pulmonary disease, unspecified: Secondary | ICD-10-CM | POA: Diagnosis not present

## 2022-07-05 DIAGNOSIS — I219 Acute myocardial infarction, unspecified: Secondary | ICD-10-CM | POA: Diagnosis not present

## 2022-07-09 DIAGNOSIS — I219 Acute myocardial infarction, unspecified: Secondary | ICD-10-CM | POA: Diagnosis not present

## 2022-07-09 DIAGNOSIS — I119 Hypertensive heart disease without heart failure: Secondary | ICD-10-CM | POA: Diagnosis not present

## 2022-07-09 DIAGNOSIS — I10 Essential (primary) hypertension: Secondary | ICD-10-CM | POA: Diagnosis not present

## 2022-07-09 DIAGNOSIS — I2510 Atherosclerotic heart disease of native coronary artery without angina pectoris: Secondary | ICD-10-CM | POA: Diagnosis not present

## 2022-07-09 DIAGNOSIS — E119 Type 2 diabetes mellitus without complications: Secondary | ICD-10-CM | POA: Diagnosis not present

## 2022-07-09 DIAGNOSIS — J449 Chronic obstructive pulmonary disease, unspecified: Secondary | ICD-10-CM | POA: Diagnosis not present

## 2022-07-10 DIAGNOSIS — I219 Acute myocardial infarction, unspecified: Secondary | ICD-10-CM | POA: Diagnosis not present

## 2022-07-10 DIAGNOSIS — E119 Type 2 diabetes mellitus without complications: Secondary | ICD-10-CM | POA: Diagnosis not present

## 2022-07-10 DIAGNOSIS — I2510 Atherosclerotic heart disease of native coronary artery without angina pectoris: Secondary | ICD-10-CM | POA: Diagnosis not present

## 2022-07-10 DIAGNOSIS — I10 Essential (primary) hypertension: Secondary | ICD-10-CM | POA: Diagnosis not present

## 2022-07-10 DIAGNOSIS — I119 Hypertensive heart disease without heart failure: Secondary | ICD-10-CM | POA: Diagnosis not present

## 2022-07-10 DIAGNOSIS — J449 Chronic obstructive pulmonary disease, unspecified: Secondary | ICD-10-CM | POA: Diagnosis not present

## 2022-07-12 DIAGNOSIS — E119 Type 2 diabetes mellitus without complications: Secondary | ICD-10-CM | POA: Diagnosis not present

## 2022-07-12 DIAGNOSIS — I219 Acute myocardial infarction, unspecified: Secondary | ICD-10-CM | POA: Diagnosis not present

## 2022-07-12 DIAGNOSIS — J449 Chronic obstructive pulmonary disease, unspecified: Secondary | ICD-10-CM | POA: Diagnosis not present

## 2022-07-12 DIAGNOSIS — I2510 Atherosclerotic heart disease of native coronary artery without angina pectoris: Secondary | ICD-10-CM | POA: Diagnosis not present

## 2022-07-12 DIAGNOSIS — I119 Hypertensive heart disease without heart failure: Secondary | ICD-10-CM | POA: Diagnosis not present

## 2022-07-12 DIAGNOSIS — I10 Essential (primary) hypertension: Secondary | ICD-10-CM | POA: Diagnosis not present

## 2022-07-15 DIAGNOSIS — I219 Acute myocardial infarction, unspecified: Secondary | ICD-10-CM | POA: Diagnosis not present

## 2022-07-15 DIAGNOSIS — E119 Type 2 diabetes mellitus without complications: Secondary | ICD-10-CM | POA: Diagnosis not present

## 2022-07-15 DIAGNOSIS — I2510 Atherosclerotic heart disease of native coronary artery without angina pectoris: Secondary | ICD-10-CM | POA: Diagnosis not present

## 2022-07-15 DIAGNOSIS — I119 Hypertensive heart disease without heart failure: Secondary | ICD-10-CM | POA: Diagnosis not present

## 2022-07-15 DIAGNOSIS — I10 Essential (primary) hypertension: Secondary | ICD-10-CM | POA: Diagnosis not present

## 2022-07-15 DIAGNOSIS — J449 Chronic obstructive pulmonary disease, unspecified: Secondary | ICD-10-CM | POA: Diagnosis not present

## 2022-07-16 DIAGNOSIS — I219 Acute myocardial infarction, unspecified: Secondary | ICD-10-CM | POA: Diagnosis not present

## 2022-07-16 DIAGNOSIS — I119 Hypertensive heart disease without heart failure: Secondary | ICD-10-CM | POA: Diagnosis not present

## 2022-07-16 DIAGNOSIS — N390 Urinary tract infection, site not specified: Secondary | ICD-10-CM | POA: Diagnosis not present

## 2022-07-16 DIAGNOSIS — I10 Essential (primary) hypertension: Secondary | ICD-10-CM | POA: Diagnosis not present

## 2022-07-16 DIAGNOSIS — J449 Chronic obstructive pulmonary disease, unspecified: Secondary | ICD-10-CM | POA: Diagnosis not present

## 2022-07-16 DIAGNOSIS — E119 Type 2 diabetes mellitus without complications: Secondary | ICD-10-CM | POA: Diagnosis not present

## 2022-07-16 DIAGNOSIS — I2510 Atherosclerotic heart disease of native coronary artery without angina pectoris: Secondary | ICD-10-CM | POA: Diagnosis not present

## 2022-07-19 DIAGNOSIS — I2510 Atherosclerotic heart disease of native coronary artery without angina pectoris: Secondary | ICD-10-CM | POA: Diagnosis not present

## 2022-07-19 DIAGNOSIS — I119 Hypertensive heart disease without heart failure: Secondary | ICD-10-CM | POA: Diagnosis not present

## 2022-07-19 DIAGNOSIS — I219 Acute myocardial infarction, unspecified: Secondary | ICD-10-CM | POA: Diagnosis not present

## 2022-07-19 DIAGNOSIS — E119 Type 2 diabetes mellitus without complications: Secondary | ICD-10-CM | POA: Diagnosis not present

## 2022-07-19 DIAGNOSIS — J449 Chronic obstructive pulmonary disease, unspecified: Secondary | ICD-10-CM | POA: Diagnosis not present

## 2022-07-19 DIAGNOSIS — I10 Essential (primary) hypertension: Secondary | ICD-10-CM | POA: Diagnosis not present

## 2022-07-23 DIAGNOSIS — J449 Chronic obstructive pulmonary disease, unspecified: Secondary | ICD-10-CM | POA: Diagnosis not present

## 2022-07-23 DIAGNOSIS — I219 Acute myocardial infarction, unspecified: Secondary | ICD-10-CM | POA: Diagnosis not present

## 2022-07-23 DIAGNOSIS — I10 Essential (primary) hypertension: Secondary | ICD-10-CM | POA: Diagnosis not present

## 2022-07-23 DIAGNOSIS — E119 Type 2 diabetes mellitus without complications: Secondary | ICD-10-CM | POA: Diagnosis not present

## 2022-07-23 DIAGNOSIS — I119 Hypertensive heart disease without heart failure: Secondary | ICD-10-CM | POA: Diagnosis not present

## 2022-07-23 DIAGNOSIS — I2510 Atherosclerotic heart disease of native coronary artery without angina pectoris: Secondary | ICD-10-CM | POA: Diagnosis not present

## 2022-07-24 DIAGNOSIS — I119 Hypertensive heart disease without heart failure: Secondary | ICD-10-CM | POA: Diagnosis not present

## 2022-07-24 DIAGNOSIS — I219 Acute myocardial infarction, unspecified: Secondary | ICD-10-CM | POA: Diagnosis not present

## 2022-07-24 DIAGNOSIS — I10 Essential (primary) hypertension: Secondary | ICD-10-CM | POA: Diagnosis not present

## 2022-07-24 DIAGNOSIS — J449 Chronic obstructive pulmonary disease, unspecified: Secondary | ICD-10-CM | POA: Diagnosis not present

## 2022-07-24 DIAGNOSIS — E119 Type 2 diabetes mellitus without complications: Secondary | ICD-10-CM | POA: Diagnosis not present

## 2022-07-24 DIAGNOSIS — I2510 Atherosclerotic heart disease of native coronary artery without angina pectoris: Secondary | ICD-10-CM | POA: Diagnosis not present

## 2022-07-25 DIAGNOSIS — I2510 Atherosclerotic heart disease of native coronary artery without angina pectoris: Secondary | ICD-10-CM | POA: Diagnosis not present

## 2022-07-25 DIAGNOSIS — I119 Hypertensive heart disease without heart failure: Secondary | ICD-10-CM | POA: Diagnosis not present

## 2022-07-25 DIAGNOSIS — J449 Chronic obstructive pulmonary disease, unspecified: Secondary | ICD-10-CM | POA: Diagnosis not present

## 2022-07-25 DIAGNOSIS — E119 Type 2 diabetes mellitus without complications: Secondary | ICD-10-CM | POA: Diagnosis not present

## 2022-07-25 DIAGNOSIS — I219 Acute myocardial infarction, unspecified: Secondary | ICD-10-CM | POA: Diagnosis not present

## 2022-07-25 DIAGNOSIS — I10 Essential (primary) hypertension: Secondary | ICD-10-CM | POA: Diagnosis not present

## 2022-07-28 DIAGNOSIS — J449 Chronic obstructive pulmonary disease, unspecified: Secondary | ICD-10-CM | POA: Diagnosis not present

## 2022-07-28 DIAGNOSIS — E119 Type 2 diabetes mellitus without complications: Secondary | ICD-10-CM | POA: Diagnosis not present

## 2022-07-28 DIAGNOSIS — I219 Acute myocardial infarction, unspecified: Secondary | ICD-10-CM | POA: Diagnosis not present

## 2022-07-28 DIAGNOSIS — Z950 Presence of cardiac pacemaker: Secondary | ICD-10-CM | POA: Diagnosis not present

## 2022-07-28 DIAGNOSIS — I119 Hypertensive heart disease without heart failure: Secondary | ICD-10-CM | POA: Diagnosis not present

## 2022-07-28 DIAGNOSIS — I2510 Atherosclerotic heart disease of native coronary artery without angina pectoris: Secondary | ICD-10-CM | POA: Diagnosis not present

## 2022-07-28 DIAGNOSIS — Z66 Do not resuscitate: Secondary | ICD-10-CM | POA: Diagnosis not present

## 2022-07-28 DIAGNOSIS — I10 Essential (primary) hypertension: Secondary | ICD-10-CM | POA: Diagnosis not present

## 2022-07-30 DIAGNOSIS — E119 Type 2 diabetes mellitus without complications: Secondary | ICD-10-CM | POA: Diagnosis not present

## 2022-07-30 DIAGNOSIS — I2510 Atherosclerotic heart disease of native coronary artery without angina pectoris: Secondary | ICD-10-CM | POA: Diagnosis not present

## 2022-07-30 DIAGNOSIS — I10 Essential (primary) hypertension: Secondary | ICD-10-CM | POA: Diagnosis not present

## 2022-07-30 DIAGNOSIS — I119 Hypertensive heart disease without heart failure: Secondary | ICD-10-CM | POA: Diagnosis not present

## 2022-07-30 DIAGNOSIS — J449 Chronic obstructive pulmonary disease, unspecified: Secondary | ICD-10-CM | POA: Diagnosis not present

## 2022-07-30 DIAGNOSIS — I219 Acute myocardial infarction, unspecified: Secondary | ICD-10-CM | POA: Diagnosis not present

## 2022-08-01 DIAGNOSIS — I10 Essential (primary) hypertension: Secondary | ICD-10-CM | POA: Diagnosis not present

## 2022-08-01 DIAGNOSIS — I219 Acute myocardial infarction, unspecified: Secondary | ICD-10-CM | POA: Diagnosis not present

## 2022-08-01 DIAGNOSIS — I119 Hypertensive heart disease without heart failure: Secondary | ICD-10-CM | POA: Diagnosis not present

## 2022-08-01 DIAGNOSIS — I2510 Atherosclerotic heart disease of native coronary artery without angina pectoris: Secondary | ICD-10-CM | POA: Diagnosis not present

## 2022-08-01 DIAGNOSIS — J449 Chronic obstructive pulmonary disease, unspecified: Secondary | ICD-10-CM | POA: Diagnosis not present

## 2022-08-01 DIAGNOSIS — E119 Type 2 diabetes mellitus without complications: Secondary | ICD-10-CM | POA: Diagnosis not present

## 2022-08-02 DIAGNOSIS — E119 Type 2 diabetes mellitus without complications: Secondary | ICD-10-CM | POA: Diagnosis not present

## 2022-08-02 DIAGNOSIS — J449 Chronic obstructive pulmonary disease, unspecified: Secondary | ICD-10-CM | POA: Diagnosis not present

## 2022-08-02 DIAGNOSIS — I219 Acute myocardial infarction, unspecified: Secondary | ICD-10-CM | POA: Diagnosis not present

## 2022-08-02 DIAGNOSIS — I2510 Atherosclerotic heart disease of native coronary artery without angina pectoris: Secondary | ICD-10-CM | POA: Diagnosis not present

## 2022-08-02 DIAGNOSIS — I119 Hypertensive heart disease without heart failure: Secondary | ICD-10-CM | POA: Diagnosis not present

## 2022-08-02 DIAGNOSIS — I10 Essential (primary) hypertension: Secondary | ICD-10-CM | POA: Diagnosis not present

## 2022-08-05 DIAGNOSIS — I119 Hypertensive heart disease without heart failure: Secondary | ICD-10-CM | POA: Diagnosis not present

## 2022-08-05 DIAGNOSIS — I2510 Atherosclerotic heart disease of native coronary artery without angina pectoris: Secondary | ICD-10-CM | POA: Diagnosis not present

## 2022-08-05 DIAGNOSIS — J449 Chronic obstructive pulmonary disease, unspecified: Secondary | ICD-10-CM | POA: Diagnosis not present

## 2022-08-05 DIAGNOSIS — I10 Essential (primary) hypertension: Secondary | ICD-10-CM | POA: Diagnosis not present

## 2022-08-05 DIAGNOSIS — I219 Acute myocardial infarction, unspecified: Secondary | ICD-10-CM | POA: Diagnosis not present

## 2022-08-05 DIAGNOSIS — E119 Type 2 diabetes mellitus without complications: Secondary | ICD-10-CM | POA: Diagnosis not present

## 2022-08-06 DIAGNOSIS — E119 Type 2 diabetes mellitus without complications: Secondary | ICD-10-CM | POA: Diagnosis not present

## 2022-08-06 DIAGNOSIS — J449 Chronic obstructive pulmonary disease, unspecified: Secondary | ICD-10-CM | POA: Diagnosis not present

## 2022-08-06 DIAGNOSIS — I119 Hypertensive heart disease without heart failure: Secondary | ICD-10-CM | POA: Diagnosis not present

## 2022-08-06 DIAGNOSIS — I2510 Atherosclerotic heart disease of native coronary artery without angina pectoris: Secondary | ICD-10-CM | POA: Diagnosis not present

## 2022-08-06 DIAGNOSIS — I219 Acute myocardial infarction, unspecified: Secondary | ICD-10-CM | POA: Diagnosis not present

## 2022-08-06 DIAGNOSIS — I10 Essential (primary) hypertension: Secondary | ICD-10-CM | POA: Diagnosis not present

## 2022-08-07 ENCOUNTER — Encounter: Payer: Medicare Other | Admitting: Cardiology

## 2022-08-07 NOTE — Progress Notes (Deleted)
Electrophysiology Office Follow up Visit Note:    Date:  08/07/2022   ID:  [name], DOB [birth year], MRN [id number]  PCP:  Etta Grandchild, MD  [facility] Cardiologist:  Olga Millers, MD  [facility] Electrophysiologist:  Lanier Prude, MD    Interval History:    [name] is a 81 y.o. male who presents for a follow up visit after a PPM implant 05/02/2022. PPM implanted for intermittent CHB and syncope.        Past Medical History:  Diagnosis Date   Asthma    as a child   Carotid arterial disease (HCC)    a. 11/2015 Carotid U/S: LICA 40-59%, RICA 100 CTO, nl subclavian arteries--f/u 1 yr.   Coronary atherosclerosis of native coronary artery    a. 09/2001 inflat MI/PCI: RCA 71m (3.0x18 AVE S7 BMS); b. 07/2006 Cath: LM nl, LAD 50p, LCX min irregs, OM1 large, min irregs, OM2 min irregs, RCA 20 ISR;  c. 10/2012 MV: EF 74%, no ischemia.   Essential hypertension    Heart murmur    since a child   Hyperlipidemia    Myocardial infarction [facility]) 2002   Pneumonia    age 45   Right inguinal hernia     Past Surgical History:  Procedure Laterality Date   CARDIAC CATHETERIZATION     Carotid stents     COLONOSCOPY     FRACTURE SURGERY     HERNIA REPAIR     ventral   INGUINAL HERNIA REPAIR Right 11/01/2016   Procedure: RIGHT INGUINAL HERNIA REPAIR WITH MESH;  Surgeon: Avel Peace, MD;  Location: [facility] OR;  Service: General;  Laterality: Right;   INSERTION OF MESH Right 11/01/2016   Procedure: INSERTION OF MESH;  Surgeon: Avel Peace, MD;  Location: [facility] OR;  Service: General;  Laterality: Right;   ORIF SHOULDER FRACTURE Right    PACEMAKER IMPLANT N/A 05/02/2022   Procedure: PACEMAKER IMPLANT;  Surgeon: Lanier Prude, MD;  Location: [facility] INVASIVE CV LAB;  Service: Cardiovascular;  Laterality: N/A;    Current Medications: No outpatient medications have been marked as taking for the 08/07/22 encounter (Appointment) with Lanier Prude, MD.     Allergies:    Penicillins   Social History   Socioeconomic History   Marital status: Married    Spouse name: Not on file   Number of children: Not on file   Years of education: Not on file   Highest education level: Not on file  Occupational History    Employer: RETIRED  Tobacco Use   Smoking status: Former    Packs/day: 1.00    Years: 44.00    Total pack years: 44.00    Types: Cigarettes    Quit date: 10/28/1998    Years since quitting: 23.7   Smokeless tobacco: Former    Types: Chew   Tobacco comments:    quit chewing tobacco 2001 ish  Substance and Sexual Activity   Alcohol use: No    Alcohol/week: 0.0 standard drinks of alcohol   Drug use: No   Sexual activity: Not on file  Other Topics Concern   Not on file  Social History Narrative   Lives in [place] with his wife.   Social Determinants of Health   Financial Resource Strain: Not on file  Food Insecurity: Not on file  Transportation Needs: Not on file  Physical Activity: Not on file  Stress: Not on file  Social Connections: Unknown (05/07/2022)   Social  Connection and Isolation Panel [NHANES]    Frequency of Communication with Friends and Family: Not on file    Frequency of Social Gatherings with Friends and Family: Not on file    Attends Religious Services: Not on file    Active Member of Clubs or Organizations: Not on file    Attends Archivist Meetings: Not on file    Marital Status: Married     Family History: The patient's family history includes Hypertension in an other family member.  ROS:   Please see the history of present illness.    All other systems reviewed and are negative.  EKGs/Labs/Other Studies Reviewed:    The following studies were reviewed today:  08/07/2022 In clinic device interrogation personally reviewed ***    Recent Labs: 10/04/2021: Pro B Natriuretic peptide (BNP) 27.0 04/28/2022: TSH 0.878 05/15/2022: ALT 20 05/16/2022: Magnesium 1.8 05/18/2022: BUN 28; Creatinine, Ser  0.92; Hemoglobin 10.1; Platelets 249; Potassium 3.7; Sodium 133  Recent Lipid Panel    Component Value Date/Time   CHOL 88 08/16/2021 1439   CHOL 134 07/20/2020 0824   TRIG 73.0 08/16/2021 1439   TRIG 139 10/02/2006 0742   HDL 36.50 (L) 08/16/2021 1439   HDL 43 07/20/2020 0824   CHOLHDL 2 08/16/2021 1439   VLDL 14.6 08/16/2021 1439   LDLCALC 36 08/16/2021 1439   LDLCALC 58 07/20/2020 0824   LDLDIRECT 77.7 04/12/2008 0718    Physical Exam:    VS:  There were no vitals taken for this visit.    Wt Readings from Last 3 Encounters:  05/20/22 107 lb 12.9 oz (48.9 kg)  05/13/22 115 lb (52.2 kg)  04/28/22 122 lb 8 oz (55.6 kg)     GEN: *** Well nourished, well developed in no acute distress HEENT: Normal NECK: No JVD; No carotid bruits LYMPHATICS: No lymphadenopathy CARDIAC: ***RRR, no murmurs, rubs, gallops RESPIRATORY:  Clear to auscultation without rales, wheezing or rhonchi  ABDOMEN: Soft, non-tender, non-distended MUSCULOSKELETAL:  No edema; No deformity  SKIN: Warm and dry NEUROLOGIC:  Alert and oriented x 3 PSYCHIATRIC:  Normal affect        ASSESSMENT:    1. Complete heart block (HCC)   2. Cardiac pacemaker in situ    PLAN:    In order of problems listed above:           Total time spent with patient today *** minutes. This includes reviewing records, evaluating the patient and coordinating care.   Medication Adjustments/Labs and Tests Ordered: Current medicines are reviewed at length with the patient today.  Concerns regarding medicines are outlined above.  No orders of the defined types were placed in this encounter.  No orders of the defined types were placed in this encounter.    Signed, Lars Mage, MD, [facility], [facility] 08/07/2022 5:14 AM    Electrophysiology [facility]

## 2022-08-08 DIAGNOSIS — E119 Type 2 diabetes mellitus without complications: Secondary | ICD-10-CM | POA: Diagnosis not present

## 2022-08-08 DIAGNOSIS — I2510 Atherosclerotic heart disease of native coronary artery without angina pectoris: Secondary | ICD-10-CM | POA: Diagnosis not present

## 2022-08-08 DIAGNOSIS — J449 Chronic obstructive pulmonary disease, unspecified: Secondary | ICD-10-CM | POA: Diagnosis not present

## 2022-08-08 DIAGNOSIS — I119 Hypertensive heart disease without heart failure: Secondary | ICD-10-CM | POA: Diagnosis not present

## 2022-08-08 DIAGNOSIS — I219 Acute myocardial infarction, unspecified: Secondary | ICD-10-CM | POA: Diagnosis not present

## 2022-08-08 DIAGNOSIS — I10 Essential (primary) hypertension: Secondary | ICD-10-CM | POA: Diagnosis not present

## 2022-08-09 DIAGNOSIS — J449 Chronic obstructive pulmonary disease, unspecified: Secondary | ICD-10-CM | POA: Diagnosis not present

## 2022-08-09 DIAGNOSIS — I2510 Atherosclerotic heart disease of native coronary artery without angina pectoris: Secondary | ICD-10-CM | POA: Diagnosis not present

## 2022-08-09 DIAGNOSIS — I219 Acute myocardial infarction, unspecified: Secondary | ICD-10-CM | POA: Diagnosis not present

## 2022-08-09 DIAGNOSIS — I119 Hypertensive heart disease without heart failure: Secondary | ICD-10-CM | POA: Diagnosis not present

## 2022-08-09 DIAGNOSIS — E119 Type 2 diabetes mellitus without complications: Secondary | ICD-10-CM | POA: Diagnosis not present

## 2022-08-09 DIAGNOSIS — I10 Essential (primary) hypertension: Secondary | ICD-10-CM | POA: Diagnosis not present

## 2022-08-11 ENCOUNTER — Other Ambulatory Visit: Payer: Self-pay | Admitting: Internal Medicine

## 2022-08-11 DIAGNOSIS — J449 Chronic obstructive pulmonary disease, unspecified: Secondary | ICD-10-CM

## 2022-08-12 ENCOUNTER — Other Ambulatory Visit: Payer: Self-pay | Admitting: Internal Medicine

## 2022-08-12 DIAGNOSIS — I119 Hypertensive heart disease without heart failure: Secondary | ICD-10-CM | POA: Diagnosis not present

## 2022-08-12 DIAGNOSIS — I2510 Atherosclerotic heart disease of native coronary artery without angina pectoris: Secondary | ICD-10-CM | POA: Diagnosis not present

## 2022-08-12 DIAGNOSIS — J449 Chronic obstructive pulmonary disease, unspecified: Secondary | ICD-10-CM

## 2022-08-12 DIAGNOSIS — I10 Essential (primary) hypertension: Secondary | ICD-10-CM | POA: Diagnosis not present

## 2022-08-12 DIAGNOSIS — I219 Acute myocardial infarction, unspecified: Secondary | ICD-10-CM | POA: Diagnosis not present

## 2022-08-12 DIAGNOSIS — E119 Type 2 diabetes mellitus without complications: Secondary | ICD-10-CM | POA: Diagnosis not present

## 2022-08-13 DIAGNOSIS — I2510 Atherosclerotic heart disease of native coronary artery without angina pectoris: Secondary | ICD-10-CM | POA: Diagnosis not present

## 2022-08-13 DIAGNOSIS — E119 Type 2 diabetes mellitus without complications: Secondary | ICD-10-CM | POA: Diagnosis not present

## 2022-08-13 DIAGNOSIS — I10 Essential (primary) hypertension: Secondary | ICD-10-CM | POA: Diagnosis not present

## 2022-08-13 DIAGNOSIS — I119 Hypertensive heart disease without heart failure: Secondary | ICD-10-CM | POA: Diagnosis not present

## 2022-08-13 DIAGNOSIS — I219 Acute myocardial infarction, unspecified: Secondary | ICD-10-CM | POA: Diagnosis not present

## 2022-08-13 DIAGNOSIS — J449 Chronic obstructive pulmonary disease, unspecified: Secondary | ICD-10-CM | POA: Diagnosis not present

## 2022-08-14 DIAGNOSIS — I119 Hypertensive heart disease without heart failure: Secondary | ICD-10-CM | POA: Diagnosis not present

## 2022-08-14 DIAGNOSIS — E119 Type 2 diabetes mellitus without complications: Secondary | ICD-10-CM | POA: Diagnosis not present

## 2022-08-14 DIAGNOSIS — I219 Acute myocardial infarction, unspecified: Secondary | ICD-10-CM | POA: Diagnosis not present

## 2022-08-14 DIAGNOSIS — I2510 Atherosclerotic heart disease of native coronary artery without angina pectoris: Secondary | ICD-10-CM | POA: Diagnosis not present

## 2022-08-14 DIAGNOSIS — I10 Essential (primary) hypertension: Secondary | ICD-10-CM | POA: Diagnosis not present

## 2022-08-14 DIAGNOSIS — J449 Chronic obstructive pulmonary disease, unspecified: Secondary | ICD-10-CM | POA: Diagnosis not present

## 2022-08-16 DIAGNOSIS — I2510 Atherosclerotic heart disease of native coronary artery without angina pectoris: Secondary | ICD-10-CM | POA: Diagnosis not present

## 2022-08-16 DIAGNOSIS — I219 Acute myocardial infarction, unspecified: Secondary | ICD-10-CM | POA: Diagnosis not present

## 2022-08-16 DIAGNOSIS — I119 Hypertensive heart disease without heart failure: Secondary | ICD-10-CM | POA: Diagnosis not present

## 2022-08-16 DIAGNOSIS — E119 Type 2 diabetes mellitus without complications: Secondary | ICD-10-CM | POA: Diagnosis not present

## 2022-08-16 DIAGNOSIS — J449 Chronic obstructive pulmonary disease, unspecified: Secondary | ICD-10-CM | POA: Diagnosis not present

## 2022-08-16 DIAGNOSIS — I10 Essential (primary) hypertension: Secondary | ICD-10-CM | POA: Diagnosis not present

## 2022-08-20 DIAGNOSIS — J449 Chronic obstructive pulmonary disease, unspecified: Secondary | ICD-10-CM | POA: Diagnosis not present

## 2022-08-20 DIAGNOSIS — I219 Acute myocardial infarction, unspecified: Secondary | ICD-10-CM | POA: Diagnosis not present

## 2022-08-20 DIAGNOSIS — I10 Essential (primary) hypertension: Secondary | ICD-10-CM | POA: Diagnosis not present

## 2022-08-20 DIAGNOSIS — I2510 Atherosclerotic heart disease of native coronary artery without angina pectoris: Secondary | ICD-10-CM | POA: Diagnosis not present

## 2022-08-20 DIAGNOSIS — E119 Type 2 diabetes mellitus without complications: Secondary | ICD-10-CM | POA: Diagnosis not present

## 2022-08-20 DIAGNOSIS — I119 Hypertensive heart disease without heart failure: Secondary | ICD-10-CM | POA: Diagnosis not present

## 2022-08-22 DIAGNOSIS — I2510 Atherosclerotic heart disease of native coronary artery without angina pectoris: Secondary | ICD-10-CM | POA: Diagnosis not present

## 2022-08-22 DIAGNOSIS — E119 Type 2 diabetes mellitus without complications: Secondary | ICD-10-CM | POA: Diagnosis not present

## 2022-08-22 DIAGNOSIS — J449 Chronic obstructive pulmonary disease, unspecified: Secondary | ICD-10-CM | POA: Diagnosis not present

## 2022-08-22 DIAGNOSIS — I10 Essential (primary) hypertension: Secondary | ICD-10-CM | POA: Diagnosis not present

## 2022-08-22 DIAGNOSIS — I119 Hypertensive heart disease without heart failure: Secondary | ICD-10-CM | POA: Diagnosis not present

## 2022-08-22 DIAGNOSIS — I219 Acute myocardial infarction, unspecified: Secondary | ICD-10-CM | POA: Diagnosis not present

## 2022-08-23 ENCOUNTER — Ambulatory Visit (INDEPENDENT_AMBULATORY_CARE_PROVIDER_SITE_OTHER): Payer: Medicare Other

## 2022-08-23 DIAGNOSIS — I2510 Atherosclerotic heart disease of native coronary artery without angina pectoris: Secondary | ICD-10-CM | POA: Diagnosis not present

## 2022-08-23 DIAGNOSIS — I679 Cerebrovascular disease, unspecified: Secondary | ICD-10-CM

## 2022-08-23 DIAGNOSIS — I219 Acute myocardial infarction, unspecified: Secondary | ICD-10-CM | POA: Diagnosis not present

## 2022-08-23 DIAGNOSIS — J449 Chronic obstructive pulmonary disease, unspecified: Secondary | ICD-10-CM | POA: Diagnosis not present

## 2022-08-23 DIAGNOSIS — I119 Hypertensive heart disease without heart failure: Secondary | ICD-10-CM | POA: Diagnosis not present

## 2022-08-23 DIAGNOSIS — I10 Essential (primary) hypertension: Secondary | ICD-10-CM | POA: Diagnosis not present

## 2022-08-23 DIAGNOSIS — E119 Type 2 diabetes mellitus without complications: Secondary | ICD-10-CM | POA: Diagnosis not present

## 2022-08-26 DIAGNOSIS — I10 Essential (primary) hypertension: Secondary | ICD-10-CM | POA: Diagnosis not present

## 2022-08-26 DIAGNOSIS — I119 Hypertensive heart disease without heart failure: Secondary | ICD-10-CM | POA: Diagnosis not present

## 2022-08-26 DIAGNOSIS — I2510 Atherosclerotic heart disease of native coronary artery without angina pectoris: Secondary | ICD-10-CM | POA: Diagnosis not present

## 2022-08-26 DIAGNOSIS — I219 Acute myocardial infarction, unspecified: Secondary | ICD-10-CM | POA: Diagnosis not present

## 2022-08-26 DIAGNOSIS — E119 Type 2 diabetes mellitus without complications: Secondary | ICD-10-CM | POA: Diagnosis not present

## 2022-08-26 DIAGNOSIS — J449 Chronic obstructive pulmonary disease, unspecified: Secondary | ICD-10-CM | POA: Diagnosis not present

## 2022-08-26 LAB — CUP PACEART REMOTE DEVICE CHECK
Battery Remaining Longevity: 101 mo
Battery Remaining Percentage: 95.5 %
Battery Voltage: 3.04 V
Brady Statistic AP VP Percent: 1 %
Brady Statistic AP VS Percent: 11 %
Brady Statistic AS VP Percent: 1 %
Brady Statistic AS VS Percent: 87 %
Brady Statistic RA Percent Paced: 7.2 %
Brady Statistic RV Percent Paced: 1 %
Date Time Interrogation Session: 20231027172233
Implantable Lead Connection Status: 753985
Implantable Lead Connection Status: 753985
Implantable Lead Implant Date: 20230706
Implantable Lead Implant Date: 20230706
Implantable Lead Location: 753859
Implantable Lead Location: 753860
Implantable Pulse Generator Implant Date: 20230706
Lead Channel Impedance Value: 530 Ohm
Lead Channel Impedance Value: 630 Ohm
Lead Channel Pacing Threshold Amplitude: 0.5 V
Lead Channel Pacing Threshold Amplitude: 0.5 V
Lead Channel Pacing Threshold Pulse Width: 0.3 ms
Lead Channel Pacing Threshold Pulse Width: 0.5 ms
Lead Channel Sensing Intrinsic Amplitude: 12 mV
Lead Channel Sensing Intrinsic Amplitude: 3.5 mV
Lead Channel Setting Pacing Amplitude: 3.5 V
Lead Channel Setting Pacing Amplitude: 3.5 V
Lead Channel Setting Pacing Pulse Width: 0.5 ms
Lead Channel Setting Sensing Sensitivity: 2 mV
Pulse Gen Model: 2272
Pulse Gen Serial Number: 8098905

## 2022-08-27 DIAGNOSIS — I119 Hypertensive heart disease without heart failure: Secondary | ICD-10-CM | POA: Diagnosis not present

## 2022-08-27 DIAGNOSIS — I2510 Atherosclerotic heart disease of native coronary artery without angina pectoris: Secondary | ICD-10-CM | POA: Diagnosis not present

## 2022-08-27 DIAGNOSIS — J449 Chronic obstructive pulmonary disease, unspecified: Secondary | ICD-10-CM | POA: Diagnosis not present

## 2022-08-27 DIAGNOSIS — I10 Essential (primary) hypertension: Secondary | ICD-10-CM | POA: Diagnosis not present

## 2022-08-27 DIAGNOSIS — E119 Type 2 diabetes mellitus without complications: Secondary | ICD-10-CM | POA: Diagnosis not present

## 2022-08-27 DIAGNOSIS — I219 Acute myocardial infarction, unspecified: Secondary | ICD-10-CM | POA: Diagnosis not present

## 2022-08-28 DIAGNOSIS — Z950 Presence of cardiac pacemaker: Secondary | ICD-10-CM | POA: Diagnosis not present

## 2022-08-28 DIAGNOSIS — I10 Essential (primary) hypertension: Secondary | ICD-10-CM | POA: Diagnosis not present

## 2022-08-28 DIAGNOSIS — J449 Chronic obstructive pulmonary disease, unspecified: Secondary | ICD-10-CM | POA: Diagnosis not present

## 2022-08-28 DIAGNOSIS — I119 Hypertensive heart disease without heart failure: Secondary | ICD-10-CM | POA: Diagnosis not present

## 2022-08-28 DIAGNOSIS — I219 Acute myocardial infarction, unspecified: Secondary | ICD-10-CM | POA: Diagnosis not present

## 2022-08-28 DIAGNOSIS — I2510 Atherosclerotic heart disease of native coronary artery without angina pectoris: Secondary | ICD-10-CM | POA: Diagnosis not present

## 2022-08-28 DIAGNOSIS — E119 Type 2 diabetes mellitus without complications: Secondary | ICD-10-CM | POA: Diagnosis not present

## 2022-08-28 DIAGNOSIS — Z66 Do not resuscitate: Secondary | ICD-10-CM | POA: Diagnosis not present

## 2022-08-29 NOTE — Progress Notes (Signed)
Remote pacemaker transmission.   

## 2022-08-30 DIAGNOSIS — E119 Type 2 diabetes mellitus without complications: Secondary | ICD-10-CM | POA: Diagnosis not present

## 2022-08-30 DIAGNOSIS — I119 Hypertensive heart disease without heart failure: Secondary | ICD-10-CM | POA: Diagnosis not present

## 2022-08-30 DIAGNOSIS — I2510 Atherosclerotic heart disease of native coronary artery without angina pectoris: Secondary | ICD-10-CM | POA: Diagnosis not present

## 2022-08-30 DIAGNOSIS — I219 Acute myocardial infarction, unspecified: Secondary | ICD-10-CM | POA: Diagnosis not present

## 2022-08-30 DIAGNOSIS — I10 Essential (primary) hypertension: Secondary | ICD-10-CM | POA: Diagnosis not present

## 2022-08-30 DIAGNOSIS — J449 Chronic obstructive pulmonary disease, unspecified: Secondary | ICD-10-CM | POA: Diagnosis not present

## 2022-09-02 DIAGNOSIS — I2510 Atherosclerotic heart disease of native coronary artery without angina pectoris: Secondary | ICD-10-CM | POA: Diagnosis not present

## 2022-09-02 DIAGNOSIS — J449 Chronic obstructive pulmonary disease, unspecified: Secondary | ICD-10-CM | POA: Diagnosis not present

## 2022-09-02 DIAGNOSIS — I219 Acute myocardial infarction, unspecified: Secondary | ICD-10-CM | POA: Diagnosis not present

## 2022-09-02 DIAGNOSIS — I119 Hypertensive heart disease without heart failure: Secondary | ICD-10-CM | POA: Diagnosis not present

## 2022-09-02 DIAGNOSIS — E119 Type 2 diabetes mellitus without complications: Secondary | ICD-10-CM | POA: Diagnosis not present

## 2022-09-02 DIAGNOSIS — I10 Essential (primary) hypertension: Secondary | ICD-10-CM | POA: Diagnosis not present

## 2022-09-03 DIAGNOSIS — J449 Chronic obstructive pulmonary disease, unspecified: Secondary | ICD-10-CM | POA: Diagnosis not present

## 2022-09-03 DIAGNOSIS — I219 Acute myocardial infarction, unspecified: Secondary | ICD-10-CM | POA: Diagnosis not present

## 2022-09-03 DIAGNOSIS — I119 Hypertensive heart disease without heart failure: Secondary | ICD-10-CM | POA: Diagnosis not present

## 2022-09-03 DIAGNOSIS — E119 Type 2 diabetes mellitus without complications: Secondary | ICD-10-CM | POA: Diagnosis not present

## 2022-09-03 DIAGNOSIS — I10 Essential (primary) hypertension: Secondary | ICD-10-CM | POA: Diagnosis not present

## 2022-09-03 DIAGNOSIS — I2510 Atherosclerotic heart disease of native coronary artery without angina pectoris: Secondary | ICD-10-CM | POA: Diagnosis not present

## 2022-09-04 DIAGNOSIS — E119 Type 2 diabetes mellitus without complications: Secondary | ICD-10-CM | POA: Diagnosis not present

## 2022-09-04 DIAGNOSIS — I2510 Atherosclerotic heart disease of native coronary artery without angina pectoris: Secondary | ICD-10-CM | POA: Diagnosis not present

## 2022-09-04 DIAGNOSIS — I219 Acute myocardial infarction, unspecified: Secondary | ICD-10-CM | POA: Diagnosis not present

## 2022-09-04 DIAGNOSIS — J449 Chronic obstructive pulmonary disease, unspecified: Secondary | ICD-10-CM | POA: Diagnosis not present

## 2022-09-04 DIAGNOSIS — I119 Hypertensive heart disease without heart failure: Secondary | ICD-10-CM | POA: Diagnosis not present

## 2022-09-04 DIAGNOSIS — I10 Essential (primary) hypertension: Secondary | ICD-10-CM | POA: Diagnosis not present

## 2022-09-06 DIAGNOSIS — J449 Chronic obstructive pulmonary disease, unspecified: Secondary | ICD-10-CM | POA: Diagnosis not present

## 2022-09-06 DIAGNOSIS — I10 Essential (primary) hypertension: Secondary | ICD-10-CM | POA: Diagnosis not present

## 2022-09-06 DIAGNOSIS — I2510 Atherosclerotic heart disease of native coronary artery without angina pectoris: Secondary | ICD-10-CM | POA: Diagnosis not present

## 2022-09-06 DIAGNOSIS — I219 Acute myocardial infarction, unspecified: Secondary | ICD-10-CM | POA: Diagnosis not present

## 2022-09-06 DIAGNOSIS — I119 Hypertensive heart disease without heart failure: Secondary | ICD-10-CM | POA: Diagnosis not present

## 2022-09-06 DIAGNOSIS — E119 Type 2 diabetes mellitus without complications: Secondary | ICD-10-CM | POA: Diagnosis not present

## 2022-09-09 DIAGNOSIS — I10 Essential (primary) hypertension: Secondary | ICD-10-CM | POA: Diagnosis not present

## 2022-09-09 DIAGNOSIS — E119 Type 2 diabetes mellitus without complications: Secondary | ICD-10-CM | POA: Diagnosis not present

## 2022-09-09 DIAGNOSIS — J449 Chronic obstructive pulmonary disease, unspecified: Secondary | ICD-10-CM | POA: Diagnosis not present

## 2022-09-09 DIAGNOSIS — I119 Hypertensive heart disease without heart failure: Secondary | ICD-10-CM | POA: Diagnosis not present

## 2022-09-09 DIAGNOSIS — I2510 Atherosclerotic heart disease of native coronary artery without angina pectoris: Secondary | ICD-10-CM | POA: Diagnosis not present

## 2022-09-09 DIAGNOSIS — I219 Acute myocardial infarction, unspecified: Secondary | ICD-10-CM | POA: Diagnosis not present

## 2022-09-10 DIAGNOSIS — I2510 Atherosclerotic heart disease of native coronary artery without angina pectoris: Secondary | ICD-10-CM | POA: Diagnosis not present

## 2022-09-10 DIAGNOSIS — I119 Hypertensive heart disease without heart failure: Secondary | ICD-10-CM | POA: Diagnosis not present

## 2022-09-10 DIAGNOSIS — E119 Type 2 diabetes mellitus without complications: Secondary | ICD-10-CM | POA: Diagnosis not present

## 2022-09-10 DIAGNOSIS — J449 Chronic obstructive pulmonary disease, unspecified: Secondary | ICD-10-CM | POA: Diagnosis not present

## 2022-09-10 DIAGNOSIS — I10 Essential (primary) hypertension: Secondary | ICD-10-CM | POA: Diagnosis not present

## 2022-09-10 DIAGNOSIS — I219 Acute myocardial infarction, unspecified: Secondary | ICD-10-CM | POA: Diagnosis not present

## 2022-09-11 DIAGNOSIS — I2510 Atherosclerotic heart disease of native coronary artery without angina pectoris: Secondary | ICD-10-CM | POA: Diagnosis not present

## 2022-09-11 DIAGNOSIS — I119 Hypertensive heart disease without heart failure: Secondary | ICD-10-CM | POA: Diagnosis not present

## 2022-09-11 DIAGNOSIS — I10 Essential (primary) hypertension: Secondary | ICD-10-CM | POA: Diagnosis not present

## 2022-09-11 DIAGNOSIS — I219 Acute myocardial infarction, unspecified: Secondary | ICD-10-CM | POA: Diagnosis not present

## 2022-09-11 DIAGNOSIS — E119 Type 2 diabetes mellitus without complications: Secondary | ICD-10-CM | POA: Diagnosis not present

## 2022-09-11 DIAGNOSIS — J449 Chronic obstructive pulmonary disease, unspecified: Secondary | ICD-10-CM | POA: Diagnosis not present

## 2022-09-13 DIAGNOSIS — I119 Hypertensive heart disease without heart failure: Secondary | ICD-10-CM | POA: Diagnosis not present

## 2022-09-13 DIAGNOSIS — I10 Essential (primary) hypertension: Secondary | ICD-10-CM | POA: Diagnosis not present

## 2022-09-13 DIAGNOSIS — I219 Acute myocardial infarction, unspecified: Secondary | ICD-10-CM | POA: Diagnosis not present

## 2022-09-13 DIAGNOSIS — E119 Type 2 diabetes mellitus without complications: Secondary | ICD-10-CM | POA: Diagnosis not present

## 2022-09-13 DIAGNOSIS — J449 Chronic obstructive pulmonary disease, unspecified: Secondary | ICD-10-CM | POA: Diagnosis not present

## 2022-09-13 DIAGNOSIS — I2510 Atherosclerotic heart disease of native coronary artery without angina pectoris: Secondary | ICD-10-CM | POA: Diagnosis not present

## 2022-09-17 DIAGNOSIS — I119 Hypertensive heart disease without heart failure: Secondary | ICD-10-CM | POA: Diagnosis not present

## 2022-09-17 DIAGNOSIS — I219 Acute myocardial infarction, unspecified: Secondary | ICD-10-CM | POA: Diagnosis not present

## 2022-09-17 DIAGNOSIS — J449 Chronic obstructive pulmonary disease, unspecified: Secondary | ICD-10-CM | POA: Diagnosis not present

## 2022-09-17 DIAGNOSIS — I10 Essential (primary) hypertension: Secondary | ICD-10-CM | POA: Diagnosis not present

## 2022-09-17 DIAGNOSIS — I2510 Atherosclerotic heart disease of native coronary artery without angina pectoris: Secondary | ICD-10-CM | POA: Diagnosis not present

## 2022-09-17 DIAGNOSIS — E119 Type 2 diabetes mellitus without complications: Secondary | ICD-10-CM | POA: Diagnosis not present

## 2022-09-18 DIAGNOSIS — I119 Hypertensive heart disease without heart failure: Secondary | ICD-10-CM | POA: Diagnosis not present

## 2022-09-18 DIAGNOSIS — J449 Chronic obstructive pulmonary disease, unspecified: Secondary | ICD-10-CM | POA: Diagnosis not present

## 2022-09-18 DIAGNOSIS — I10 Essential (primary) hypertension: Secondary | ICD-10-CM | POA: Diagnosis not present

## 2022-09-18 DIAGNOSIS — I219 Acute myocardial infarction, unspecified: Secondary | ICD-10-CM | POA: Diagnosis not present

## 2022-09-18 DIAGNOSIS — I2510 Atherosclerotic heart disease of native coronary artery without angina pectoris: Secondary | ICD-10-CM | POA: Diagnosis not present

## 2022-09-18 DIAGNOSIS — E119 Type 2 diabetes mellitus without complications: Secondary | ICD-10-CM | POA: Diagnosis not present

## 2022-09-20 DIAGNOSIS — I10 Essential (primary) hypertension: Secondary | ICD-10-CM | POA: Diagnosis not present

## 2022-09-20 DIAGNOSIS — E119 Type 2 diabetes mellitus without complications: Secondary | ICD-10-CM | POA: Diagnosis not present

## 2022-09-20 DIAGNOSIS — I2510 Atherosclerotic heart disease of native coronary artery without angina pectoris: Secondary | ICD-10-CM | POA: Diagnosis not present

## 2022-09-20 DIAGNOSIS — I119 Hypertensive heart disease without heart failure: Secondary | ICD-10-CM | POA: Diagnosis not present

## 2022-09-20 DIAGNOSIS — I219 Acute myocardial infarction, unspecified: Secondary | ICD-10-CM | POA: Diagnosis not present

## 2022-09-20 DIAGNOSIS — J449 Chronic obstructive pulmonary disease, unspecified: Secondary | ICD-10-CM | POA: Diagnosis not present

## 2022-09-24 DIAGNOSIS — I119 Hypertensive heart disease without heart failure: Secondary | ICD-10-CM | POA: Diagnosis not present

## 2022-09-24 DIAGNOSIS — J449 Chronic obstructive pulmonary disease, unspecified: Secondary | ICD-10-CM | POA: Diagnosis not present

## 2022-09-24 DIAGNOSIS — I2510 Atherosclerotic heart disease of native coronary artery without angina pectoris: Secondary | ICD-10-CM | POA: Diagnosis not present

## 2022-09-24 DIAGNOSIS — E119 Type 2 diabetes mellitus without complications: Secondary | ICD-10-CM | POA: Diagnosis not present

## 2022-09-24 DIAGNOSIS — I219 Acute myocardial infarction, unspecified: Secondary | ICD-10-CM | POA: Diagnosis not present

## 2022-09-24 DIAGNOSIS — I10 Essential (primary) hypertension: Secondary | ICD-10-CM | POA: Diagnosis not present

## 2022-09-25 DIAGNOSIS — I219 Acute myocardial infarction, unspecified: Secondary | ICD-10-CM | POA: Diagnosis not present

## 2022-09-25 DIAGNOSIS — I2510 Atherosclerotic heart disease of native coronary artery without angina pectoris: Secondary | ICD-10-CM | POA: Diagnosis not present

## 2022-09-25 DIAGNOSIS — E119 Type 2 diabetes mellitus without complications: Secondary | ICD-10-CM | POA: Diagnosis not present

## 2022-09-25 DIAGNOSIS — I119 Hypertensive heart disease without heart failure: Secondary | ICD-10-CM | POA: Diagnosis not present

## 2022-09-25 DIAGNOSIS — J449 Chronic obstructive pulmonary disease, unspecified: Secondary | ICD-10-CM | POA: Diagnosis not present

## 2022-09-25 DIAGNOSIS — I10 Essential (primary) hypertension: Secondary | ICD-10-CM | POA: Diagnosis not present

## 2022-09-26 DIAGNOSIS — I119 Hypertensive heart disease without heart failure: Secondary | ICD-10-CM | POA: Diagnosis not present

## 2022-09-26 DIAGNOSIS — I10 Essential (primary) hypertension: Secondary | ICD-10-CM | POA: Diagnosis not present

## 2022-09-26 DIAGNOSIS — I219 Acute myocardial infarction, unspecified: Secondary | ICD-10-CM | POA: Diagnosis not present

## 2022-09-26 DIAGNOSIS — I2510 Atherosclerotic heart disease of native coronary artery without angina pectoris: Secondary | ICD-10-CM | POA: Diagnosis not present

## 2022-09-26 DIAGNOSIS — J449 Chronic obstructive pulmonary disease, unspecified: Secondary | ICD-10-CM | POA: Diagnosis not present

## 2022-09-26 DIAGNOSIS — E119 Type 2 diabetes mellitus without complications: Secondary | ICD-10-CM | POA: Diagnosis not present

## 2022-09-27 DIAGNOSIS — I10 Essential (primary) hypertension: Secondary | ICD-10-CM | POA: Diagnosis not present

## 2022-09-27 DIAGNOSIS — Z66 Do not resuscitate: Secondary | ICD-10-CM | POA: Diagnosis not present

## 2022-09-27 DIAGNOSIS — Z950 Presence of cardiac pacemaker: Secondary | ICD-10-CM | POA: Diagnosis not present

## 2022-09-27 DIAGNOSIS — I2510 Atherosclerotic heart disease of native coronary artery without angina pectoris: Secondary | ICD-10-CM | POA: Diagnosis not present

## 2022-09-27 DIAGNOSIS — I219 Acute myocardial infarction, unspecified: Secondary | ICD-10-CM | POA: Diagnosis not present

## 2022-09-27 DIAGNOSIS — J449 Chronic obstructive pulmonary disease, unspecified: Secondary | ICD-10-CM | POA: Diagnosis not present

## 2022-09-27 DIAGNOSIS — I119 Hypertensive heart disease without heart failure: Secondary | ICD-10-CM | POA: Diagnosis not present

## 2022-09-27 DIAGNOSIS — E119 Type 2 diabetes mellitus without complications: Secondary | ICD-10-CM | POA: Diagnosis not present

## 2022-09-30 DIAGNOSIS — E119 Type 2 diabetes mellitus without complications: Secondary | ICD-10-CM | POA: Diagnosis not present

## 2022-09-30 DIAGNOSIS — I10 Essential (primary) hypertension: Secondary | ICD-10-CM | POA: Diagnosis not present

## 2022-09-30 DIAGNOSIS — J449 Chronic obstructive pulmonary disease, unspecified: Secondary | ICD-10-CM | POA: Diagnosis not present

## 2022-09-30 DIAGNOSIS — I219 Acute myocardial infarction, unspecified: Secondary | ICD-10-CM | POA: Diagnosis not present

## 2022-09-30 DIAGNOSIS — I119 Hypertensive heart disease without heart failure: Secondary | ICD-10-CM | POA: Diagnosis not present

## 2022-09-30 DIAGNOSIS — I2510 Atherosclerotic heart disease of native coronary artery without angina pectoris: Secondary | ICD-10-CM | POA: Diagnosis not present

## 2022-10-01 DIAGNOSIS — I119 Hypertensive heart disease without heart failure: Secondary | ICD-10-CM | POA: Diagnosis not present

## 2022-10-01 DIAGNOSIS — E119 Type 2 diabetes mellitus without complications: Secondary | ICD-10-CM | POA: Diagnosis not present

## 2022-10-01 DIAGNOSIS — I2510 Atherosclerotic heart disease of native coronary artery without angina pectoris: Secondary | ICD-10-CM | POA: Diagnosis not present

## 2022-10-01 DIAGNOSIS — J449 Chronic obstructive pulmonary disease, unspecified: Secondary | ICD-10-CM | POA: Diagnosis not present

## 2022-10-01 DIAGNOSIS — I10 Essential (primary) hypertension: Secondary | ICD-10-CM | POA: Diagnosis not present

## 2022-10-01 DIAGNOSIS — I219 Acute myocardial infarction, unspecified: Secondary | ICD-10-CM | POA: Diagnosis not present

## 2022-10-04 DIAGNOSIS — I219 Acute myocardial infarction, unspecified: Secondary | ICD-10-CM | POA: Diagnosis not present

## 2022-10-04 DIAGNOSIS — I10 Essential (primary) hypertension: Secondary | ICD-10-CM | POA: Diagnosis not present

## 2022-10-04 DIAGNOSIS — E119 Type 2 diabetes mellitus without complications: Secondary | ICD-10-CM | POA: Diagnosis not present

## 2022-10-04 DIAGNOSIS — I2510 Atherosclerotic heart disease of native coronary artery without angina pectoris: Secondary | ICD-10-CM | POA: Diagnosis not present

## 2022-10-04 DIAGNOSIS — I119 Hypertensive heart disease without heart failure: Secondary | ICD-10-CM | POA: Diagnosis not present

## 2022-10-04 DIAGNOSIS — J449 Chronic obstructive pulmonary disease, unspecified: Secondary | ICD-10-CM | POA: Diagnosis not present

## 2022-10-08 DIAGNOSIS — I119 Hypertensive heart disease without heart failure: Secondary | ICD-10-CM | POA: Diagnosis not present

## 2022-10-08 DIAGNOSIS — I10 Essential (primary) hypertension: Secondary | ICD-10-CM | POA: Diagnosis not present

## 2022-10-08 DIAGNOSIS — E119 Type 2 diabetes mellitus without complications: Secondary | ICD-10-CM | POA: Diagnosis not present

## 2022-10-08 DIAGNOSIS — I219 Acute myocardial infarction, unspecified: Secondary | ICD-10-CM | POA: Diagnosis not present

## 2022-10-08 DIAGNOSIS — J449 Chronic obstructive pulmonary disease, unspecified: Secondary | ICD-10-CM | POA: Diagnosis not present

## 2022-10-08 DIAGNOSIS — I2510 Atherosclerotic heart disease of native coronary artery without angina pectoris: Secondary | ICD-10-CM | POA: Diagnosis not present

## 2022-10-10 DIAGNOSIS — E119 Type 2 diabetes mellitus without complications: Secondary | ICD-10-CM | POA: Diagnosis not present

## 2022-10-10 DIAGNOSIS — I119 Hypertensive heart disease without heart failure: Secondary | ICD-10-CM | POA: Diagnosis not present

## 2022-10-10 DIAGNOSIS — I219 Acute myocardial infarction, unspecified: Secondary | ICD-10-CM | POA: Diagnosis not present

## 2022-10-10 DIAGNOSIS — I10 Essential (primary) hypertension: Secondary | ICD-10-CM | POA: Diagnosis not present

## 2022-10-10 DIAGNOSIS — J449 Chronic obstructive pulmonary disease, unspecified: Secondary | ICD-10-CM | POA: Diagnosis not present

## 2022-10-10 DIAGNOSIS — I2510 Atherosclerotic heart disease of native coronary artery without angina pectoris: Secondary | ICD-10-CM | POA: Diagnosis not present

## 2022-10-11 DIAGNOSIS — E119 Type 2 diabetes mellitus without complications: Secondary | ICD-10-CM | POA: Diagnosis not present

## 2022-10-11 DIAGNOSIS — I2510 Atherosclerotic heart disease of native coronary artery without angina pectoris: Secondary | ICD-10-CM | POA: Diagnosis not present

## 2022-10-11 DIAGNOSIS — I10 Essential (primary) hypertension: Secondary | ICD-10-CM | POA: Diagnosis not present

## 2022-10-11 DIAGNOSIS — J449 Chronic obstructive pulmonary disease, unspecified: Secondary | ICD-10-CM | POA: Diagnosis not present

## 2022-10-11 DIAGNOSIS — I119 Hypertensive heart disease without heart failure: Secondary | ICD-10-CM | POA: Diagnosis not present

## 2022-10-11 DIAGNOSIS — I219 Acute myocardial infarction, unspecified: Secondary | ICD-10-CM | POA: Diagnosis not present

## 2022-10-15 DIAGNOSIS — I219 Acute myocardial infarction, unspecified: Secondary | ICD-10-CM | POA: Diagnosis not present

## 2022-10-15 DIAGNOSIS — I10 Essential (primary) hypertension: Secondary | ICD-10-CM | POA: Diagnosis not present

## 2022-10-15 DIAGNOSIS — E119 Type 2 diabetes mellitus without complications: Secondary | ICD-10-CM | POA: Diagnosis not present

## 2022-10-15 DIAGNOSIS — I2510 Atherosclerotic heart disease of native coronary artery without angina pectoris: Secondary | ICD-10-CM | POA: Diagnosis not present

## 2022-10-15 DIAGNOSIS — J449 Chronic obstructive pulmonary disease, unspecified: Secondary | ICD-10-CM | POA: Diagnosis not present

## 2022-10-15 DIAGNOSIS — I119 Hypertensive heart disease without heart failure: Secondary | ICD-10-CM | POA: Diagnosis not present

## 2022-10-16 DIAGNOSIS — J449 Chronic obstructive pulmonary disease, unspecified: Secondary | ICD-10-CM | POA: Diagnosis not present

## 2022-10-16 DIAGNOSIS — I119 Hypertensive heart disease without heart failure: Secondary | ICD-10-CM | POA: Diagnosis not present

## 2022-10-16 DIAGNOSIS — E119 Type 2 diabetes mellitus without complications: Secondary | ICD-10-CM | POA: Diagnosis not present

## 2022-10-16 DIAGNOSIS — I219 Acute myocardial infarction, unspecified: Secondary | ICD-10-CM | POA: Diagnosis not present

## 2022-10-16 DIAGNOSIS — I2510 Atherosclerotic heart disease of native coronary artery without angina pectoris: Secondary | ICD-10-CM | POA: Diagnosis not present

## 2022-10-16 DIAGNOSIS — I10 Essential (primary) hypertension: Secondary | ICD-10-CM | POA: Diagnosis not present

## 2022-10-18 DIAGNOSIS — I10 Essential (primary) hypertension: Secondary | ICD-10-CM | POA: Diagnosis not present

## 2022-10-18 DIAGNOSIS — I119 Hypertensive heart disease without heart failure: Secondary | ICD-10-CM | POA: Diagnosis not present

## 2022-10-18 DIAGNOSIS — E119 Type 2 diabetes mellitus without complications: Secondary | ICD-10-CM | POA: Diagnosis not present

## 2022-10-18 DIAGNOSIS — I219 Acute myocardial infarction, unspecified: Secondary | ICD-10-CM | POA: Diagnosis not present

## 2022-10-18 DIAGNOSIS — I2510 Atherosclerotic heart disease of native coronary artery without angina pectoris: Secondary | ICD-10-CM | POA: Diagnosis not present

## 2022-10-18 DIAGNOSIS — J449 Chronic obstructive pulmonary disease, unspecified: Secondary | ICD-10-CM | POA: Diagnosis not present

## 2022-10-24 DIAGNOSIS — I219 Acute myocardial infarction, unspecified: Secondary | ICD-10-CM | POA: Diagnosis not present

## 2022-10-24 DIAGNOSIS — I10 Essential (primary) hypertension: Secondary | ICD-10-CM | POA: Diagnosis not present

## 2022-10-24 DIAGNOSIS — I2510 Atherosclerotic heart disease of native coronary artery without angina pectoris: Secondary | ICD-10-CM | POA: Diagnosis not present

## 2022-10-24 DIAGNOSIS — E119 Type 2 diabetes mellitus without complications: Secondary | ICD-10-CM | POA: Diagnosis not present

## 2022-10-24 DIAGNOSIS — I119 Hypertensive heart disease without heart failure: Secondary | ICD-10-CM | POA: Diagnosis not present

## 2022-10-24 DIAGNOSIS — J449 Chronic obstructive pulmonary disease, unspecified: Secondary | ICD-10-CM | POA: Diagnosis not present

## 2022-10-28 DIAGNOSIS — Z66 Do not resuscitate: Secondary | ICD-10-CM | POA: Diagnosis not present

## 2022-10-28 DIAGNOSIS — Z950 Presence of cardiac pacemaker: Secondary | ICD-10-CM | POA: Diagnosis not present

## 2022-10-28 DIAGNOSIS — E119 Type 2 diabetes mellitus without complications: Secondary | ICD-10-CM | POA: Diagnosis not present

## 2022-10-28 DIAGNOSIS — J449 Chronic obstructive pulmonary disease, unspecified: Secondary | ICD-10-CM | POA: Diagnosis not present

## 2022-10-28 DIAGNOSIS — I2510 Atherosclerotic heart disease of native coronary artery without angina pectoris: Secondary | ICD-10-CM | POA: Diagnosis not present

## 2022-10-28 DIAGNOSIS — I119 Hypertensive heart disease without heart failure: Secondary | ICD-10-CM | POA: Diagnosis not present

## 2022-10-30 DIAGNOSIS — J449 Chronic obstructive pulmonary disease, unspecified: Secondary | ICD-10-CM | POA: Diagnosis not present

## 2022-10-30 DIAGNOSIS — I2510 Atherosclerotic heart disease of native coronary artery without angina pectoris: Secondary | ICD-10-CM | POA: Diagnosis not present

## 2022-10-30 DIAGNOSIS — E119 Type 2 diabetes mellitus without complications: Secondary | ICD-10-CM | POA: Diagnosis not present

## 2022-10-30 DIAGNOSIS — Z66 Do not resuscitate: Secondary | ICD-10-CM | POA: Diagnosis not present

## 2022-10-30 DIAGNOSIS — Z950 Presence of cardiac pacemaker: Secondary | ICD-10-CM | POA: Diagnosis not present

## 2022-10-30 DIAGNOSIS — I119 Hypertensive heart disease without heart failure: Secondary | ICD-10-CM | POA: Diagnosis not present

## 2022-11-01 DIAGNOSIS — E119 Type 2 diabetes mellitus without complications: Secondary | ICD-10-CM | POA: Diagnosis not present

## 2022-11-01 DIAGNOSIS — I119 Hypertensive heart disease without heart failure: Secondary | ICD-10-CM | POA: Diagnosis not present

## 2022-11-01 DIAGNOSIS — J449 Chronic obstructive pulmonary disease, unspecified: Secondary | ICD-10-CM | POA: Diagnosis not present

## 2022-11-01 DIAGNOSIS — I2510 Atherosclerotic heart disease of native coronary artery without angina pectoris: Secondary | ICD-10-CM | POA: Diagnosis not present

## 2022-11-01 DIAGNOSIS — Z66 Do not resuscitate: Secondary | ICD-10-CM | POA: Diagnosis not present

## 2022-11-01 DIAGNOSIS — Z950 Presence of cardiac pacemaker: Secondary | ICD-10-CM | POA: Diagnosis not present

## 2022-11-04 DIAGNOSIS — Z950 Presence of cardiac pacemaker: Secondary | ICD-10-CM | POA: Diagnosis not present

## 2022-11-04 DIAGNOSIS — I119 Hypertensive heart disease without heart failure: Secondary | ICD-10-CM | POA: Diagnosis not present

## 2022-11-04 DIAGNOSIS — J449 Chronic obstructive pulmonary disease, unspecified: Secondary | ICD-10-CM | POA: Diagnosis not present

## 2022-11-04 DIAGNOSIS — I2510 Atherosclerotic heart disease of native coronary artery without angina pectoris: Secondary | ICD-10-CM | POA: Diagnosis not present

## 2022-11-04 DIAGNOSIS — Z66 Do not resuscitate: Secondary | ICD-10-CM | POA: Diagnosis not present

## 2022-11-04 DIAGNOSIS — E119 Type 2 diabetes mellitus without complications: Secondary | ICD-10-CM | POA: Diagnosis not present

## 2022-11-05 DIAGNOSIS — J449 Chronic obstructive pulmonary disease, unspecified: Secondary | ICD-10-CM | POA: Diagnosis not present

## 2022-11-05 DIAGNOSIS — I2510 Atherosclerotic heart disease of native coronary artery without angina pectoris: Secondary | ICD-10-CM | POA: Diagnosis not present

## 2022-11-05 DIAGNOSIS — E119 Type 2 diabetes mellitus without complications: Secondary | ICD-10-CM | POA: Diagnosis not present

## 2022-11-05 DIAGNOSIS — Z66 Do not resuscitate: Secondary | ICD-10-CM | POA: Diagnosis not present

## 2022-11-05 DIAGNOSIS — I119 Hypertensive heart disease without heart failure: Secondary | ICD-10-CM | POA: Diagnosis not present

## 2022-11-05 DIAGNOSIS — Z950 Presence of cardiac pacemaker: Secondary | ICD-10-CM | POA: Diagnosis not present

## 2022-11-07 DIAGNOSIS — Z66 Do not resuscitate: Secondary | ICD-10-CM | POA: Diagnosis not present

## 2022-11-07 DIAGNOSIS — J449 Chronic obstructive pulmonary disease, unspecified: Secondary | ICD-10-CM | POA: Diagnosis not present

## 2022-11-07 DIAGNOSIS — Z950 Presence of cardiac pacemaker: Secondary | ICD-10-CM | POA: Diagnosis not present

## 2022-11-07 DIAGNOSIS — I2510 Atherosclerotic heart disease of native coronary artery without angina pectoris: Secondary | ICD-10-CM | POA: Diagnosis not present

## 2022-11-07 DIAGNOSIS — I119 Hypertensive heart disease without heart failure: Secondary | ICD-10-CM | POA: Diagnosis not present

## 2022-11-07 DIAGNOSIS — E119 Type 2 diabetes mellitus without complications: Secondary | ICD-10-CM | POA: Diagnosis not present

## 2022-11-08 DIAGNOSIS — Z950 Presence of cardiac pacemaker: Secondary | ICD-10-CM | POA: Diagnosis not present

## 2022-11-08 DIAGNOSIS — Z66 Do not resuscitate: Secondary | ICD-10-CM | POA: Diagnosis not present

## 2022-11-08 DIAGNOSIS — I2510 Atherosclerotic heart disease of native coronary artery without angina pectoris: Secondary | ICD-10-CM | POA: Diagnosis not present

## 2022-11-08 DIAGNOSIS — I119 Hypertensive heart disease without heart failure: Secondary | ICD-10-CM | POA: Diagnosis not present

## 2022-11-08 DIAGNOSIS — E119 Type 2 diabetes mellitus without complications: Secondary | ICD-10-CM | POA: Diagnosis not present

## 2022-11-08 DIAGNOSIS — J449 Chronic obstructive pulmonary disease, unspecified: Secondary | ICD-10-CM | POA: Diagnosis not present

## 2022-11-12 DIAGNOSIS — Z950 Presence of cardiac pacemaker: Secondary | ICD-10-CM | POA: Diagnosis not present

## 2022-11-12 DIAGNOSIS — I119 Hypertensive heart disease without heart failure: Secondary | ICD-10-CM | POA: Diagnosis not present

## 2022-11-12 DIAGNOSIS — Z66 Do not resuscitate: Secondary | ICD-10-CM | POA: Diagnosis not present

## 2022-11-12 DIAGNOSIS — J449 Chronic obstructive pulmonary disease, unspecified: Secondary | ICD-10-CM | POA: Diagnosis not present

## 2022-11-12 DIAGNOSIS — I2510 Atherosclerotic heart disease of native coronary artery without angina pectoris: Secondary | ICD-10-CM | POA: Diagnosis not present

## 2022-11-12 DIAGNOSIS — E119 Type 2 diabetes mellitus without complications: Secondary | ICD-10-CM | POA: Diagnosis not present

## 2022-11-15 DIAGNOSIS — J449 Chronic obstructive pulmonary disease, unspecified: Secondary | ICD-10-CM | POA: Diagnosis not present

## 2022-11-15 DIAGNOSIS — I2510 Atherosclerotic heart disease of native coronary artery without angina pectoris: Secondary | ICD-10-CM | POA: Diagnosis not present

## 2022-11-15 DIAGNOSIS — Z66 Do not resuscitate: Secondary | ICD-10-CM | POA: Diagnosis not present

## 2022-11-15 DIAGNOSIS — E119 Type 2 diabetes mellitus without complications: Secondary | ICD-10-CM | POA: Diagnosis not present

## 2022-11-15 DIAGNOSIS — Z950 Presence of cardiac pacemaker: Secondary | ICD-10-CM | POA: Diagnosis not present

## 2022-11-15 DIAGNOSIS — I119 Hypertensive heart disease without heart failure: Secondary | ICD-10-CM | POA: Diagnosis not present

## 2022-11-18 DIAGNOSIS — E119 Type 2 diabetes mellitus without complications: Secondary | ICD-10-CM | POA: Diagnosis not present

## 2022-11-18 DIAGNOSIS — J449 Chronic obstructive pulmonary disease, unspecified: Secondary | ICD-10-CM | POA: Diagnosis not present

## 2022-11-18 DIAGNOSIS — Z66 Do not resuscitate: Secondary | ICD-10-CM | POA: Diagnosis not present

## 2022-11-18 DIAGNOSIS — I119 Hypertensive heart disease without heart failure: Secondary | ICD-10-CM | POA: Diagnosis not present

## 2022-11-18 DIAGNOSIS — Z950 Presence of cardiac pacemaker: Secondary | ICD-10-CM | POA: Diagnosis not present

## 2022-11-18 DIAGNOSIS — I2510 Atherosclerotic heart disease of native coronary artery without angina pectoris: Secondary | ICD-10-CM | POA: Diagnosis not present

## 2022-11-19 ENCOUNTER — Other Ambulatory Visit: Payer: Self-pay | Admitting: *Deleted

## 2022-11-19 DIAGNOSIS — Z66 Do not resuscitate: Secondary | ICD-10-CM | POA: Diagnosis not present

## 2022-11-19 DIAGNOSIS — I119 Hypertensive heart disease without heart failure: Secondary | ICD-10-CM | POA: Diagnosis not present

## 2022-11-19 DIAGNOSIS — J449 Chronic obstructive pulmonary disease, unspecified: Secondary | ICD-10-CM | POA: Diagnosis not present

## 2022-11-19 DIAGNOSIS — I2510 Atherosclerotic heart disease of native coronary artery without angina pectoris: Secondary | ICD-10-CM | POA: Diagnosis not present

## 2022-11-19 DIAGNOSIS — I679 Cerebrovascular disease, unspecified: Secondary | ICD-10-CM

## 2022-11-19 DIAGNOSIS — Z950 Presence of cardiac pacemaker: Secondary | ICD-10-CM | POA: Diagnosis not present

## 2022-11-19 DIAGNOSIS — E119 Type 2 diabetes mellitus without complications: Secondary | ICD-10-CM | POA: Diagnosis not present

## 2022-11-19 DIAGNOSIS — I6523 Occlusion and stenosis of bilateral carotid arteries: Secondary | ICD-10-CM

## 2022-11-19 NOTE — Progress Notes (Signed)
vas 

## 2022-11-22 ENCOUNTER — Ambulatory Visit (INDEPENDENT_AMBULATORY_CARE_PROVIDER_SITE_OTHER): Payer: Medicare Other

## 2022-11-22 DIAGNOSIS — I2510 Atherosclerotic heart disease of native coronary artery without angina pectoris: Secondary | ICD-10-CM | POA: Diagnosis not present

## 2022-11-22 DIAGNOSIS — J449 Chronic obstructive pulmonary disease, unspecified: Secondary | ICD-10-CM | POA: Diagnosis not present

## 2022-11-22 DIAGNOSIS — Z950 Presence of cardiac pacemaker: Secondary | ICD-10-CM | POA: Diagnosis not present

## 2022-11-22 DIAGNOSIS — I119 Hypertensive heart disease without heart failure: Secondary | ICD-10-CM | POA: Diagnosis not present

## 2022-11-22 DIAGNOSIS — Z66 Do not resuscitate: Secondary | ICD-10-CM | POA: Diagnosis not present

## 2022-11-22 DIAGNOSIS — I679 Cerebrovascular disease, unspecified: Secondary | ICD-10-CM | POA: Diagnosis not present

## 2022-11-22 DIAGNOSIS — E119 Type 2 diabetes mellitus without complications: Secondary | ICD-10-CM | POA: Diagnosis not present

## 2022-11-22 LAB — CUP PACEART REMOTE DEVICE CHECK
Battery Remaining Longevity: 98 mo
Battery Remaining Percentage: 95.5 %
Battery Voltage: 3.02 V
Brady Statistic AP VP Percent: 1 %
Brady Statistic AP VS Percent: 8.3 %
Brady Statistic AS VP Percent: 1 %
Brady Statistic AS VS Percent: 90 %
Brady Statistic RA Percent Paced: 5.7 %
Brady Statistic RV Percent Paced: 1 %
Date Time Interrogation Session: 20240126020012
Implantable Lead Connection Status: 753985
Implantable Lead Connection Status: 753985
Implantable Lead Implant Date: 20230706
Implantable Lead Implant Date: 20230706
Implantable Lead Location: 753859
Implantable Lead Location: 753860
Implantable Pulse Generator Implant Date: 20230706
Lead Channel Impedance Value: 510 Ohm
Lead Channel Impedance Value: 580 Ohm
Lead Channel Pacing Threshold Amplitude: 0.5 V
Lead Channel Pacing Threshold Amplitude: 0.5 V
Lead Channel Pacing Threshold Pulse Width: 0.3 ms
Lead Channel Pacing Threshold Pulse Width: 0.5 ms
Lead Channel Sensing Intrinsic Amplitude: 12 mV
Lead Channel Sensing Intrinsic Amplitude: 4.1 mV
Lead Channel Setting Pacing Amplitude: 3.5 V
Lead Channel Setting Pacing Amplitude: 3.5 V
Lead Channel Setting Pacing Pulse Width: 0.5 ms
Lead Channel Setting Sensing Sensitivity: 2 mV
Pulse Gen Model: 2272
Pulse Gen Serial Number: 8098905

## 2022-11-25 DIAGNOSIS — Z950 Presence of cardiac pacemaker: Secondary | ICD-10-CM | POA: Diagnosis not present

## 2022-11-25 DIAGNOSIS — I2510 Atherosclerotic heart disease of native coronary artery without angina pectoris: Secondary | ICD-10-CM | POA: Diagnosis not present

## 2022-11-25 DIAGNOSIS — Z66 Do not resuscitate: Secondary | ICD-10-CM | POA: Diagnosis not present

## 2022-11-25 DIAGNOSIS — E119 Type 2 diabetes mellitus without complications: Secondary | ICD-10-CM | POA: Diagnosis not present

## 2022-11-25 DIAGNOSIS — I119 Hypertensive heart disease without heart failure: Secondary | ICD-10-CM | POA: Diagnosis not present

## 2022-11-25 DIAGNOSIS — J449 Chronic obstructive pulmonary disease, unspecified: Secondary | ICD-10-CM | POA: Diagnosis not present

## 2022-11-26 DIAGNOSIS — J449 Chronic obstructive pulmonary disease, unspecified: Secondary | ICD-10-CM | POA: Diagnosis not present

## 2022-11-26 DIAGNOSIS — E119 Type 2 diabetes mellitus without complications: Secondary | ICD-10-CM | POA: Diagnosis not present

## 2022-11-26 DIAGNOSIS — I2510 Atherosclerotic heart disease of native coronary artery without angina pectoris: Secondary | ICD-10-CM | POA: Diagnosis not present

## 2022-11-26 DIAGNOSIS — I119 Hypertensive heart disease without heart failure: Secondary | ICD-10-CM | POA: Diagnosis not present

## 2022-11-26 DIAGNOSIS — Z950 Presence of cardiac pacemaker: Secondary | ICD-10-CM | POA: Diagnosis not present

## 2022-11-26 DIAGNOSIS — Z66 Do not resuscitate: Secondary | ICD-10-CM | POA: Diagnosis not present

## 2022-11-28 DIAGNOSIS — E119 Type 2 diabetes mellitus without complications: Secondary | ICD-10-CM | POA: Diagnosis not present

## 2022-11-28 DIAGNOSIS — J449 Chronic obstructive pulmonary disease, unspecified: Secondary | ICD-10-CM | POA: Diagnosis not present

## 2022-11-28 DIAGNOSIS — Z950 Presence of cardiac pacemaker: Secondary | ICD-10-CM | POA: Diagnosis not present

## 2022-11-28 DIAGNOSIS — Z66 Do not resuscitate: Secondary | ICD-10-CM | POA: Diagnosis not present

## 2022-11-28 DIAGNOSIS — I2510 Atherosclerotic heart disease of native coronary artery without angina pectoris: Secondary | ICD-10-CM | POA: Diagnosis not present

## 2022-11-28 DIAGNOSIS — I119 Hypertensive heart disease without heart failure: Secondary | ICD-10-CM | POA: Diagnosis not present

## 2022-11-29 DIAGNOSIS — Z950 Presence of cardiac pacemaker: Secondary | ICD-10-CM | POA: Diagnosis not present

## 2022-11-29 DIAGNOSIS — J449 Chronic obstructive pulmonary disease, unspecified: Secondary | ICD-10-CM | POA: Diagnosis not present

## 2022-11-29 DIAGNOSIS — I2510 Atherosclerotic heart disease of native coronary artery without angina pectoris: Secondary | ICD-10-CM | POA: Diagnosis not present

## 2022-11-29 DIAGNOSIS — E119 Type 2 diabetes mellitus without complications: Secondary | ICD-10-CM | POA: Diagnosis not present

## 2022-11-29 DIAGNOSIS — I119 Hypertensive heart disease without heart failure: Secondary | ICD-10-CM | POA: Diagnosis not present

## 2022-11-29 DIAGNOSIS — Z66 Do not resuscitate: Secondary | ICD-10-CM | POA: Diagnosis not present

## 2022-12-03 DIAGNOSIS — Z950 Presence of cardiac pacemaker: Secondary | ICD-10-CM | POA: Diagnosis not present

## 2022-12-03 DIAGNOSIS — Z66 Do not resuscitate: Secondary | ICD-10-CM | POA: Diagnosis not present

## 2022-12-03 DIAGNOSIS — I2510 Atherosclerotic heart disease of native coronary artery without angina pectoris: Secondary | ICD-10-CM | POA: Diagnosis not present

## 2022-12-03 DIAGNOSIS — E119 Type 2 diabetes mellitus without complications: Secondary | ICD-10-CM | POA: Diagnosis not present

## 2022-12-03 DIAGNOSIS — I119 Hypertensive heart disease without heart failure: Secondary | ICD-10-CM | POA: Diagnosis not present

## 2022-12-03 DIAGNOSIS — J449 Chronic obstructive pulmonary disease, unspecified: Secondary | ICD-10-CM | POA: Diagnosis not present

## 2022-12-05 DIAGNOSIS — Z950 Presence of cardiac pacemaker: Secondary | ICD-10-CM | POA: Diagnosis not present

## 2022-12-05 DIAGNOSIS — Z66 Do not resuscitate: Secondary | ICD-10-CM | POA: Diagnosis not present

## 2022-12-05 DIAGNOSIS — J449 Chronic obstructive pulmonary disease, unspecified: Secondary | ICD-10-CM | POA: Diagnosis not present

## 2022-12-05 DIAGNOSIS — I2510 Atherosclerotic heart disease of native coronary artery without angina pectoris: Secondary | ICD-10-CM | POA: Diagnosis not present

## 2022-12-05 DIAGNOSIS — E119 Type 2 diabetes mellitus without complications: Secondary | ICD-10-CM | POA: Diagnosis not present

## 2022-12-05 DIAGNOSIS — I119 Hypertensive heart disease without heart failure: Secondary | ICD-10-CM | POA: Diagnosis not present

## 2022-12-06 DIAGNOSIS — Z950 Presence of cardiac pacemaker: Secondary | ICD-10-CM | POA: Diagnosis not present

## 2022-12-06 DIAGNOSIS — E119 Type 2 diabetes mellitus without complications: Secondary | ICD-10-CM | POA: Diagnosis not present

## 2022-12-06 DIAGNOSIS — I119 Hypertensive heart disease without heart failure: Secondary | ICD-10-CM | POA: Diagnosis not present

## 2022-12-06 DIAGNOSIS — I2510 Atherosclerotic heart disease of native coronary artery without angina pectoris: Secondary | ICD-10-CM | POA: Diagnosis not present

## 2022-12-06 DIAGNOSIS — Z66 Do not resuscitate: Secondary | ICD-10-CM | POA: Diagnosis not present

## 2022-12-06 DIAGNOSIS — J449 Chronic obstructive pulmonary disease, unspecified: Secondary | ICD-10-CM | POA: Diagnosis not present

## 2022-12-09 NOTE — Progress Notes (Signed)
Remote pacemaker transmission.   

## 2022-12-10 DIAGNOSIS — Z950 Presence of cardiac pacemaker: Secondary | ICD-10-CM | POA: Diagnosis not present

## 2022-12-10 DIAGNOSIS — I119 Hypertensive heart disease without heart failure: Secondary | ICD-10-CM | POA: Diagnosis not present

## 2022-12-10 DIAGNOSIS — Z66 Do not resuscitate: Secondary | ICD-10-CM | POA: Diagnosis not present

## 2022-12-10 DIAGNOSIS — E119 Type 2 diabetes mellitus without complications: Secondary | ICD-10-CM | POA: Diagnosis not present

## 2022-12-10 DIAGNOSIS — J449 Chronic obstructive pulmonary disease, unspecified: Secondary | ICD-10-CM | POA: Diagnosis not present

## 2022-12-10 DIAGNOSIS — I2510 Atherosclerotic heart disease of native coronary artery without angina pectoris: Secondary | ICD-10-CM | POA: Diagnosis not present

## 2022-12-13 ENCOUNTER — Ambulatory Visit (HOSPITAL_COMMUNITY): Payer: Medicare Other

## 2022-12-13 DIAGNOSIS — I119 Hypertensive heart disease without heart failure: Secondary | ICD-10-CM | POA: Diagnosis not present

## 2022-12-13 DIAGNOSIS — Z66 Do not resuscitate: Secondary | ICD-10-CM | POA: Diagnosis not present

## 2022-12-13 DIAGNOSIS — I2510 Atherosclerotic heart disease of native coronary artery without angina pectoris: Secondary | ICD-10-CM | POA: Diagnosis not present

## 2022-12-13 DIAGNOSIS — J449 Chronic obstructive pulmonary disease, unspecified: Secondary | ICD-10-CM | POA: Diagnosis not present

## 2022-12-13 DIAGNOSIS — E119 Type 2 diabetes mellitus without complications: Secondary | ICD-10-CM | POA: Diagnosis not present

## 2022-12-13 DIAGNOSIS — Z950 Presence of cardiac pacemaker: Secondary | ICD-10-CM | POA: Diagnosis not present

## 2022-12-17 ENCOUNTER — Telehealth: Payer: Self-pay | Admitting: Cardiology

## 2022-12-17 DIAGNOSIS — J449 Chronic obstructive pulmonary disease, unspecified: Secondary | ICD-10-CM | POA: Diagnosis not present

## 2022-12-17 DIAGNOSIS — Z950 Presence of cardiac pacemaker: Secondary | ICD-10-CM | POA: Diagnosis not present

## 2022-12-17 DIAGNOSIS — E119 Type 2 diabetes mellitus without complications: Secondary | ICD-10-CM | POA: Diagnosis not present

## 2022-12-17 DIAGNOSIS — I2510 Atherosclerotic heart disease of native coronary artery without angina pectoris: Secondary | ICD-10-CM | POA: Diagnosis not present

## 2022-12-17 DIAGNOSIS — Z66 Do not resuscitate: Secondary | ICD-10-CM | POA: Diagnosis not present

## 2022-12-17 DIAGNOSIS — I119 Hypertensive heart disease without heart failure: Secondary | ICD-10-CM | POA: Diagnosis not present

## 2022-12-17 NOTE — Telephone Encounter (Signed)
Insurance update for the patient:  Medicare supplement plan F ID: [id number] Group: [id number] Eff. Date: 10/28/2022

## 2022-12-18 ENCOUNTER — Ambulatory Visit (HOSPITAL_COMMUNITY): Payer: Medicare Other

## 2022-12-19 DIAGNOSIS — E119 Type 2 diabetes mellitus without complications: Secondary | ICD-10-CM | POA: Diagnosis not present

## 2022-12-19 DIAGNOSIS — J449 Chronic obstructive pulmonary disease, unspecified: Secondary | ICD-10-CM | POA: Diagnosis not present

## 2022-12-19 DIAGNOSIS — I119 Hypertensive heart disease without heart failure: Secondary | ICD-10-CM | POA: Diagnosis not present

## 2022-12-19 DIAGNOSIS — Z950 Presence of cardiac pacemaker: Secondary | ICD-10-CM | POA: Diagnosis not present

## 2022-12-19 DIAGNOSIS — Z66 Do not resuscitate: Secondary | ICD-10-CM | POA: Diagnosis not present

## 2022-12-19 DIAGNOSIS — I2510 Atherosclerotic heart disease of native coronary artery without angina pectoris: Secondary | ICD-10-CM | POA: Diagnosis not present

## 2022-12-20 DIAGNOSIS — J449 Chronic obstructive pulmonary disease, unspecified: Secondary | ICD-10-CM | POA: Diagnosis not present

## 2022-12-20 DIAGNOSIS — Z66 Do not resuscitate: Secondary | ICD-10-CM | POA: Diagnosis not present

## 2022-12-20 DIAGNOSIS — E119 Type 2 diabetes mellitus without complications: Secondary | ICD-10-CM | POA: Diagnosis not present

## 2022-12-20 DIAGNOSIS — I2510 Atherosclerotic heart disease of native coronary artery without angina pectoris: Secondary | ICD-10-CM | POA: Diagnosis not present

## 2022-12-20 DIAGNOSIS — I119 Hypertensive heart disease without heart failure: Secondary | ICD-10-CM | POA: Diagnosis not present

## 2022-12-20 DIAGNOSIS — Z950 Presence of cardiac pacemaker: Secondary | ICD-10-CM | POA: Diagnosis not present

## 2022-12-23 DIAGNOSIS — J449 Chronic obstructive pulmonary disease, unspecified: Secondary | ICD-10-CM | POA: Diagnosis not present

## 2022-12-23 DIAGNOSIS — I119 Hypertensive heart disease without heart failure: Secondary | ICD-10-CM | POA: Diagnosis not present

## 2022-12-23 DIAGNOSIS — Z950 Presence of cardiac pacemaker: Secondary | ICD-10-CM | POA: Diagnosis not present

## 2022-12-23 DIAGNOSIS — I2510 Atherosclerotic heart disease of native coronary artery without angina pectoris: Secondary | ICD-10-CM | POA: Diagnosis not present

## 2022-12-23 DIAGNOSIS — E119 Type 2 diabetes mellitus without complications: Secondary | ICD-10-CM | POA: Diagnosis not present

## 2022-12-23 DIAGNOSIS — Z66 Do not resuscitate: Secondary | ICD-10-CM | POA: Diagnosis not present

## 2022-12-24 DIAGNOSIS — J449 Chronic obstructive pulmonary disease, unspecified: Secondary | ICD-10-CM | POA: Diagnosis not present

## 2022-12-24 DIAGNOSIS — Z66 Do not resuscitate: Secondary | ICD-10-CM | POA: Diagnosis not present

## 2022-12-24 DIAGNOSIS — Z950 Presence of cardiac pacemaker: Secondary | ICD-10-CM | POA: Diagnosis not present

## 2022-12-24 DIAGNOSIS — E119 Type 2 diabetes mellitus without complications: Secondary | ICD-10-CM | POA: Diagnosis not present

## 2022-12-24 DIAGNOSIS — I2510 Atherosclerotic heart disease of native coronary artery without angina pectoris: Secondary | ICD-10-CM | POA: Diagnosis not present

## 2022-12-24 DIAGNOSIS — I119 Hypertensive heart disease without heart failure: Secondary | ICD-10-CM | POA: Diagnosis not present

## 2022-12-26 DIAGNOSIS — I2510 Atherosclerotic heart disease of native coronary artery without angina pectoris: Secondary | ICD-10-CM | POA: Diagnosis not present

## 2022-12-26 DIAGNOSIS — J449 Chronic obstructive pulmonary disease, unspecified: Secondary | ICD-10-CM | POA: Diagnosis not present

## 2022-12-26 DIAGNOSIS — E119 Type 2 diabetes mellitus without complications: Secondary | ICD-10-CM | POA: Diagnosis not present

## 2022-12-26 DIAGNOSIS — Z66 Do not resuscitate: Secondary | ICD-10-CM | POA: Diagnosis not present

## 2022-12-26 DIAGNOSIS — Z950 Presence of cardiac pacemaker: Secondary | ICD-10-CM | POA: Diagnosis not present

## 2022-12-26 DIAGNOSIS — I119 Hypertensive heart disease without heart failure: Secondary | ICD-10-CM | POA: Diagnosis not present

## 2022-12-27 DIAGNOSIS — Z66 Do not resuscitate: Secondary | ICD-10-CM | POA: Diagnosis not present

## 2022-12-27 DIAGNOSIS — Z950 Presence of cardiac pacemaker: Secondary | ICD-10-CM | POA: Diagnosis not present

## 2022-12-27 DIAGNOSIS — I119 Hypertensive heart disease without heart failure: Secondary | ICD-10-CM | POA: Diagnosis not present

## 2022-12-27 DIAGNOSIS — E119 Type 2 diabetes mellitus without complications: Secondary | ICD-10-CM | POA: Diagnosis not present

## 2022-12-27 DIAGNOSIS — J449 Chronic obstructive pulmonary disease, unspecified: Secondary | ICD-10-CM | POA: Diagnosis not present

## 2022-12-27 DIAGNOSIS — I2510 Atherosclerotic heart disease of native coronary artery without angina pectoris: Secondary | ICD-10-CM | POA: Diagnosis not present

## 2022-12-30 DIAGNOSIS — J449 Chronic obstructive pulmonary disease, unspecified: Secondary | ICD-10-CM | POA: Diagnosis not present

## 2022-12-30 DIAGNOSIS — I2510 Atherosclerotic heart disease of native coronary artery without angina pectoris: Secondary | ICD-10-CM | POA: Diagnosis not present

## 2022-12-30 DIAGNOSIS — Z950 Presence of cardiac pacemaker: Secondary | ICD-10-CM | POA: Diagnosis not present

## 2022-12-30 DIAGNOSIS — Z66 Do not resuscitate: Secondary | ICD-10-CM | POA: Diagnosis not present

## 2022-12-30 DIAGNOSIS — I119 Hypertensive heart disease without heart failure: Secondary | ICD-10-CM | POA: Diagnosis not present

## 2022-12-30 DIAGNOSIS — E119 Type 2 diabetes mellitus without complications: Secondary | ICD-10-CM | POA: Diagnosis not present

## 2022-12-31 DIAGNOSIS — I119 Hypertensive heart disease without heart failure: Secondary | ICD-10-CM | POA: Diagnosis not present

## 2022-12-31 DIAGNOSIS — J449 Chronic obstructive pulmonary disease, unspecified: Secondary | ICD-10-CM | POA: Diagnosis not present

## 2022-12-31 DIAGNOSIS — E119 Type 2 diabetes mellitus without complications: Secondary | ICD-10-CM | POA: Diagnosis not present

## 2022-12-31 DIAGNOSIS — Z950 Presence of cardiac pacemaker: Secondary | ICD-10-CM | POA: Diagnosis not present

## 2022-12-31 DIAGNOSIS — Z66 Do not resuscitate: Secondary | ICD-10-CM | POA: Diagnosis not present

## 2022-12-31 DIAGNOSIS — I2510 Atherosclerotic heart disease of native coronary artery without angina pectoris: Secondary | ICD-10-CM | POA: Diagnosis not present

## 2023-01-01 DIAGNOSIS — Z66 Do not resuscitate: Secondary | ICD-10-CM | POA: Diagnosis not present

## 2023-01-01 DIAGNOSIS — I119 Hypertensive heart disease without heart failure: Secondary | ICD-10-CM | POA: Diagnosis not present

## 2023-01-01 DIAGNOSIS — E119 Type 2 diabetes mellitus without complications: Secondary | ICD-10-CM | POA: Diagnosis not present

## 2023-01-01 DIAGNOSIS — I2510 Atherosclerotic heart disease of native coronary artery without angina pectoris: Secondary | ICD-10-CM | POA: Diagnosis not present

## 2023-01-01 DIAGNOSIS — Z950 Presence of cardiac pacemaker: Secondary | ICD-10-CM | POA: Diagnosis not present

## 2023-01-01 DIAGNOSIS — J449 Chronic obstructive pulmonary disease, unspecified: Secondary | ICD-10-CM | POA: Diagnosis not present

## 2023-01-03 DIAGNOSIS — J449 Chronic obstructive pulmonary disease, unspecified: Secondary | ICD-10-CM | POA: Diagnosis not present

## 2023-01-03 DIAGNOSIS — I2510 Atherosclerotic heart disease of native coronary artery without angina pectoris: Secondary | ICD-10-CM | POA: Diagnosis not present

## 2023-01-03 DIAGNOSIS — E119 Type 2 diabetes mellitus without complications: Secondary | ICD-10-CM | POA: Diagnosis not present

## 2023-01-03 DIAGNOSIS — Z950 Presence of cardiac pacemaker: Secondary | ICD-10-CM | POA: Diagnosis not present

## 2023-01-03 DIAGNOSIS — Z66 Do not resuscitate: Secondary | ICD-10-CM | POA: Diagnosis not present

## 2023-01-03 DIAGNOSIS — I119 Hypertensive heart disease without heart failure: Secondary | ICD-10-CM | POA: Diagnosis not present

## 2023-01-07 DIAGNOSIS — Z950 Presence of cardiac pacemaker: Secondary | ICD-10-CM | POA: Diagnosis not present

## 2023-01-07 DIAGNOSIS — Z66 Do not resuscitate: Secondary | ICD-10-CM | POA: Diagnosis not present

## 2023-01-07 DIAGNOSIS — J449 Chronic obstructive pulmonary disease, unspecified: Secondary | ICD-10-CM | POA: Diagnosis not present

## 2023-01-07 DIAGNOSIS — E119 Type 2 diabetes mellitus without complications: Secondary | ICD-10-CM | POA: Diagnosis not present

## 2023-01-07 DIAGNOSIS — I2510 Atherosclerotic heart disease of native coronary artery without angina pectoris: Secondary | ICD-10-CM | POA: Diagnosis not present

## 2023-01-07 DIAGNOSIS — I119 Hypertensive heart disease without heart failure: Secondary | ICD-10-CM | POA: Diagnosis not present

## 2023-01-09 DIAGNOSIS — E119 Type 2 diabetes mellitus without complications: Secondary | ICD-10-CM | POA: Diagnosis not present

## 2023-01-09 DIAGNOSIS — Z950 Presence of cardiac pacemaker: Secondary | ICD-10-CM | POA: Diagnosis not present

## 2023-01-09 DIAGNOSIS — J449 Chronic obstructive pulmonary disease, unspecified: Secondary | ICD-10-CM | POA: Diagnosis not present

## 2023-01-09 DIAGNOSIS — I119 Hypertensive heart disease without heart failure: Secondary | ICD-10-CM | POA: Diagnosis not present

## 2023-01-09 DIAGNOSIS — Z66 Do not resuscitate: Secondary | ICD-10-CM | POA: Diagnosis not present

## 2023-01-09 DIAGNOSIS — I2510 Atherosclerotic heart disease of native coronary artery without angina pectoris: Secondary | ICD-10-CM | POA: Diagnosis not present

## 2023-01-10 DIAGNOSIS — Z950 Presence of cardiac pacemaker: Secondary | ICD-10-CM | POA: Diagnosis not present

## 2023-01-10 DIAGNOSIS — I119 Hypertensive heart disease without heart failure: Secondary | ICD-10-CM | POA: Diagnosis not present

## 2023-01-10 DIAGNOSIS — I2510 Atherosclerotic heart disease of native coronary artery without angina pectoris: Secondary | ICD-10-CM | POA: Diagnosis not present

## 2023-01-10 DIAGNOSIS — E119 Type 2 diabetes mellitus without complications: Secondary | ICD-10-CM | POA: Diagnosis not present

## 2023-01-10 DIAGNOSIS — J449 Chronic obstructive pulmonary disease, unspecified: Secondary | ICD-10-CM | POA: Diagnosis not present

## 2023-01-10 DIAGNOSIS — Z66 Do not resuscitate: Secondary | ICD-10-CM | POA: Diagnosis not present

## 2023-01-13 DIAGNOSIS — Z66 Do not resuscitate: Secondary | ICD-10-CM | POA: Diagnosis not present

## 2023-01-13 DIAGNOSIS — E119 Type 2 diabetes mellitus without complications: Secondary | ICD-10-CM | POA: Diagnosis not present

## 2023-01-13 DIAGNOSIS — I119 Hypertensive heart disease without heart failure: Secondary | ICD-10-CM | POA: Diagnosis not present

## 2023-01-13 DIAGNOSIS — I2510 Atherosclerotic heart disease of native coronary artery without angina pectoris: Secondary | ICD-10-CM | POA: Diagnosis not present

## 2023-01-13 DIAGNOSIS — J449 Chronic obstructive pulmonary disease, unspecified: Secondary | ICD-10-CM | POA: Diagnosis not present

## 2023-01-13 DIAGNOSIS — Z950 Presence of cardiac pacemaker: Secondary | ICD-10-CM | POA: Diagnosis not present

## 2023-01-14 DIAGNOSIS — J449 Chronic obstructive pulmonary disease, unspecified: Secondary | ICD-10-CM | POA: Diagnosis not present

## 2023-01-14 DIAGNOSIS — I2510 Atherosclerotic heart disease of native coronary artery without angina pectoris: Secondary | ICD-10-CM | POA: Diagnosis not present

## 2023-01-14 DIAGNOSIS — E119 Type 2 diabetes mellitus without complications: Secondary | ICD-10-CM | POA: Diagnosis not present

## 2023-01-14 DIAGNOSIS — Z66 Do not resuscitate: Secondary | ICD-10-CM | POA: Diagnosis not present

## 2023-01-14 DIAGNOSIS — I119 Hypertensive heart disease without heart failure: Secondary | ICD-10-CM | POA: Diagnosis not present

## 2023-01-14 DIAGNOSIS — Z950 Presence of cardiac pacemaker: Secondary | ICD-10-CM | POA: Diagnosis not present

## 2023-01-15 DIAGNOSIS — I2510 Atherosclerotic heart disease of native coronary artery without angina pectoris: Secondary | ICD-10-CM | POA: Diagnosis not present

## 2023-01-15 DIAGNOSIS — J449 Chronic obstructive pulmonary disease, unspecified: Secondary | ICD-10-CM | POA: Diagnosis not present

## 2023-01-15 DIAGNOSIS — Z950 Presence of cardiac pacemaker: Secondary | ICD-10-CM | POA: Diagnosis not present

## 2023-01-15 DIAGNOSIS — I119 Hypertensive heart disease without heart failure: Secondary | ICD-10-CM | POA: Diagnosis not present

## 2023-01-15 DIAGNOSIS — Z66 Do not resuscitate: Secondary | ICD-10-CM | POA: Diagnosis not present

## 2023-01-15 DIAGNOSIS — E119 Type 2 diabetes mellitus without complications: Secondary | ICD-10-CM | POA: Diagnosis not present

## 2023-01-21 DIAGNOSIS — J449 Chronic obstructive pulmonary disease, unspecified: Secondary | ICD-10-CM | POA: Diagnosis not present

## 2023-01-21 DIAGNOSIS — Z950 Presence of cardiac pacemaker: Secondary | ICD-10-CM | POA: Diagnosis not present

## 2023-01-21 DIAGNOSIS — I2510 Atherosclerotic heart disease of native coronary artery without angina pectoris: Secondary | ICD-10-CM | POA: Diagnosis not present

## 2023-01-21 DIAGNOSIS — I119 Hypertensive heart disease without heart failure: Secondary | ICD-10-CM | POA: Diagnosis not present

## 2023-01-21 DIAGNOSIS — Z66 Do not resuscitate: Secondary | ICD-10-CM | POA: Diagnosis not present

## 2023-01-21 DIAGNOSIS — E119 Type 2 diabetes mellitus without complications: Secondary | ICD-10-CM | POA: Diagnosis not present

## 2023-01-24 DIAGNOSIS — I2510 Atherosclerotic heart disease of native coronary artery without angina pectoris: Secondary | ICD-10-CM | POA: Diagnosis not present

## 2023-01-24 DIAGNOSIS — E119 Type 2 diabetes mellitus without complications: Secondary | ICD-10-CM | POA: Diagnosis not present

## 2023-01-24 DIAGNOSIS — Z66 Do not resuscitate: Secondary | ICD-10-CM | POA: Diagnosis not present

## 2023-01-24 DIAGNOSIS — I119 Hypertensive heart disease without heart failure: Secondary | ICD-10-CM | POA: Diagnosis not present

## 2023-01-24 DIAGNOSIS — J449 Chronic obstructive pulmonary disease, unspecified: Secondary | ICD-10-CM | POA: Diagnosis not present

## 2023-01-24 DIAGNOSIS — Z950 Presence of cardiac pacemaker: Secondary | ICD-10-CM | POA: Diagnosis not present

## 2023-01-27 DIAGNOSIS — I2510 Atherosclerotic heart disease of native coronary artery without angina pectoris: Secondary | ICD-10-CM | POA: Diagnosis not present

## 2023-01-27 DIAGNOSIS — E119 Type 2 diabetes mellitus without complications: Secondary | ICD-10-CM | POA: Diagnosis not present

## 2023-01-27 DIAGNOSIS — Z950 Presence of cardiac pacemaker: Secondary | ICD-10-CM | POA: Diagnosis not present

## 2023-01-27 DIAGNOSIS — Z66 Do not resuscitate: Secondary | ICD-10-CM | POA: Diagnosis not present

## 2023-01-27 DIAGNOSIS — I119 Hypertensive heart disease without heart failure: Secondary | ICD-10-CM | POA: Diagnosis not present

## 2023-01-27 DIAGNOSIS — J449 Chronic obstructive pulmonary disease, unspecified: Secondary | ICD-10-CM | POA: Diagnosis not present

## 2023-01-28 DIAGNOSIS — J449 Chronic obstructive pulmonary disease, unspecified: Secondary | ICD-10-CM | POA: Diagnosis not present

## 2023-01-28 DIAGNOSIS — I2510 Atherosclerotic heart disease of native coronary artery without angina pectoris: Secondary | ICD-10-CM | POA: Diagnosis not present

## 2023-01-28 DIAGNOSIS — E119 Type 2 diabetes mellitus without complications: Secondary | ICD-10-CM | POA: Diagnosis not present

## 2023-01-28 DIAGNOSIS — Z950 Presence of cardiac pacemaker: Secondary | ICD-10-CM | POA: Diagnosis not present

## 2023-01-28 DIAGNOSIS — I119 Hypertensive heart disease without heart failure: Secondary | ICD-10-CM | POA: Diagnosis not present

## 2023-01-28 DIAGNOSIS — Z66 Do not resuscitate: Secondary | ICD-10-CM | POA: Diagnosis not present

## 2023-01-31 DIAGNOSIS — I119 Hypertensive heart disease without heart failure: Secondary | ICD-10-CM | POA: Diagnosis not present

## 2023-01-31 DIAGNOSIS — J449 Chronic obstructive pulmonary disease, unspecified: Secondary | ICD-10-CM | POA: Diagnosis not present

## 2023-01-31 DIAGNOSIS — Z950 Presence of cardiac pacemaker: Secondary | ICD-10-CM | POA: Diagnosis not present

## 2023-01-31 DIAGNOSIS — E119 Type 2 diabetes mellitus without complications: Secondary | ICD-10-CM | POA: Diagnosis not present

## 2023-01-31 DIAGNOSIS — Z66 Do not resuscitate: Secondary | ICD-10-CM | POA: Diagnosis not present

## 2023-01-31 DIAGNOSIS — I2510 Atherosclerotic heart disease of native coronary artery without angina pectoris: Secondary | ICD-10-CM | POA: Diagnosis not present

## 2023-02-04 DIAGNOSIS — I119 Hypertensive heart disease without heart failure: Secondary | ICD-10-CM | POA: Diagnosis not present

## 2023-02-04 DIAGNOSIS — E119 Type 2 diabetes mellitus without complications: Secondary | ICD-10-CM | POA: Diagnosis not present

## 2023-02-04 DIAGNOSIS — I2510 Atherosclerotic heart disease of native coronary artery without angina pectoris: Secondary | ICD-10-CM | POA: Diagnosis not present

## 2023-02-04 DIAGNOSIS — Z950 Presence of cardiac pacemaker: Secondary | ICD-10-CM | POA: Diagnosis not present

## 2023-02-04 DIAGNOSIS — J449 Chronic obstructive pulmonary disease, unspecified: Secondary | ICD-10-CM | POA: Diagnosis not present

## 2023-02-04 DIAGNOSIS — Z66 Do not resuscitate: Secondary | ICD-10-CM | POA: Diagnosis not present

## 2023-02-06 DIAGNOSIS — E119 Type 2 diabetes mellitus without complications: Secondary | ICD-10-CM | POA: Diagnosis not present

## 2023-02-06 DIAGNOSIS — Z66 Do not resuscitate: Secondary | ICD-10-CM | POA: Diagnosis not present

## 2023-02-06 DIAGNOSIS — Z950 Presence of cardiac pacemaker: Secondary | ICD-10-CM | POA: Diagnosis not present

## 2023-02-06 DIAGNOSIS — I2510 Atherosclerotic heart disease of native coronary artery without angina pectoris: Secondary | ICD-10-CM | POA: Diagnosis not present

## 2023-02-06 DIAGNOSIS — I119 Hypertensive heart disease without heart failure: Secondary | ICD-10-CM | POA: Diagnosis not present

## 2023-02-06 DIAGNOSIS — J449 Chronic obstructive pulmonary disease, unspecified: Secondary | ICD-10-CM | POA: Diagnosis not present

## 2023-02-07 DIAGNOSIS — Z66 Do not resuscitate: Secondary | ICD-10-CM | POA: Diagnosis not present

## 2023-02-07 DIAGNOSIS — E119 Type 2 diabetes mellitus without complications: Secondary | ICD-10-CM | POA: Diagnosis not present

## 2023-02-07 DIAGNOSIS — I2510 Atherosclerotic heart disease of native coronary artery without angina pectoris: Secondary | ICD-10-CM | POA: Diagnosis not present

## 2023-02-07 DIAGNOSIS — I119 Hypertensive heart disease without heart failure: Secondary | ICD-10-CM | POA: Diagnosis not present

## 2023-02-07 DIAGNOSIS — Z950 Presence of cardiac pacemaker: Secondary | ICD-10-CM | POA: Diagnosis not present

## 2023-02-07 DIAGNOSIS — J449 Chronic obstructive pulmonary disease, unspecified: Secondary | ICD-10-CM | POA: Diagnosis not present

## 2023-02-12 DIAGNOSIS — J449 Chronic obstructive pulmonary disease, unspecified: Secondary | ICD-10-CM | POA: Diagnosis not present

## 2023-02-12 DIAGNOSIS — Z66 Do not resuscitate: Secondary | ICD-10-CM | POA: Diagnosis not present

## 2023-02-12 DIAGNOSIS — I119 Hypertensive heart disease without heart failure: Secondary | ICD-10-CM | POA: Diagnosis not present

## 2023-02-12 DIAGNOSIS — I2510 Atherosclerotic heart disease of native coronary artery without angina pectoris: Secondary | ICD-10-CM | POA: Diagnosis not present

## 2023-02-12 DIAGNOSIS — Z950 Presence of cardiac pacemaker: Secondary | ICD-10-CM | POA: Diagnosis not present

## 2023-02-12 DIAGNOSIS — E119 Type 2 diabetes mellitus without complications: Secondary | ICD-10-CM | POA: Diagnosis not present

## 2023-02-12 IMAGING — DX DG CHEST 2V
2 series · 2 of 2 positions shown · non-contrast
Comparison: 06/04/2020

CLINICAL DATA: 79-year-old male

EXAM:
CHEST - 2 VIEW

[chest pa]
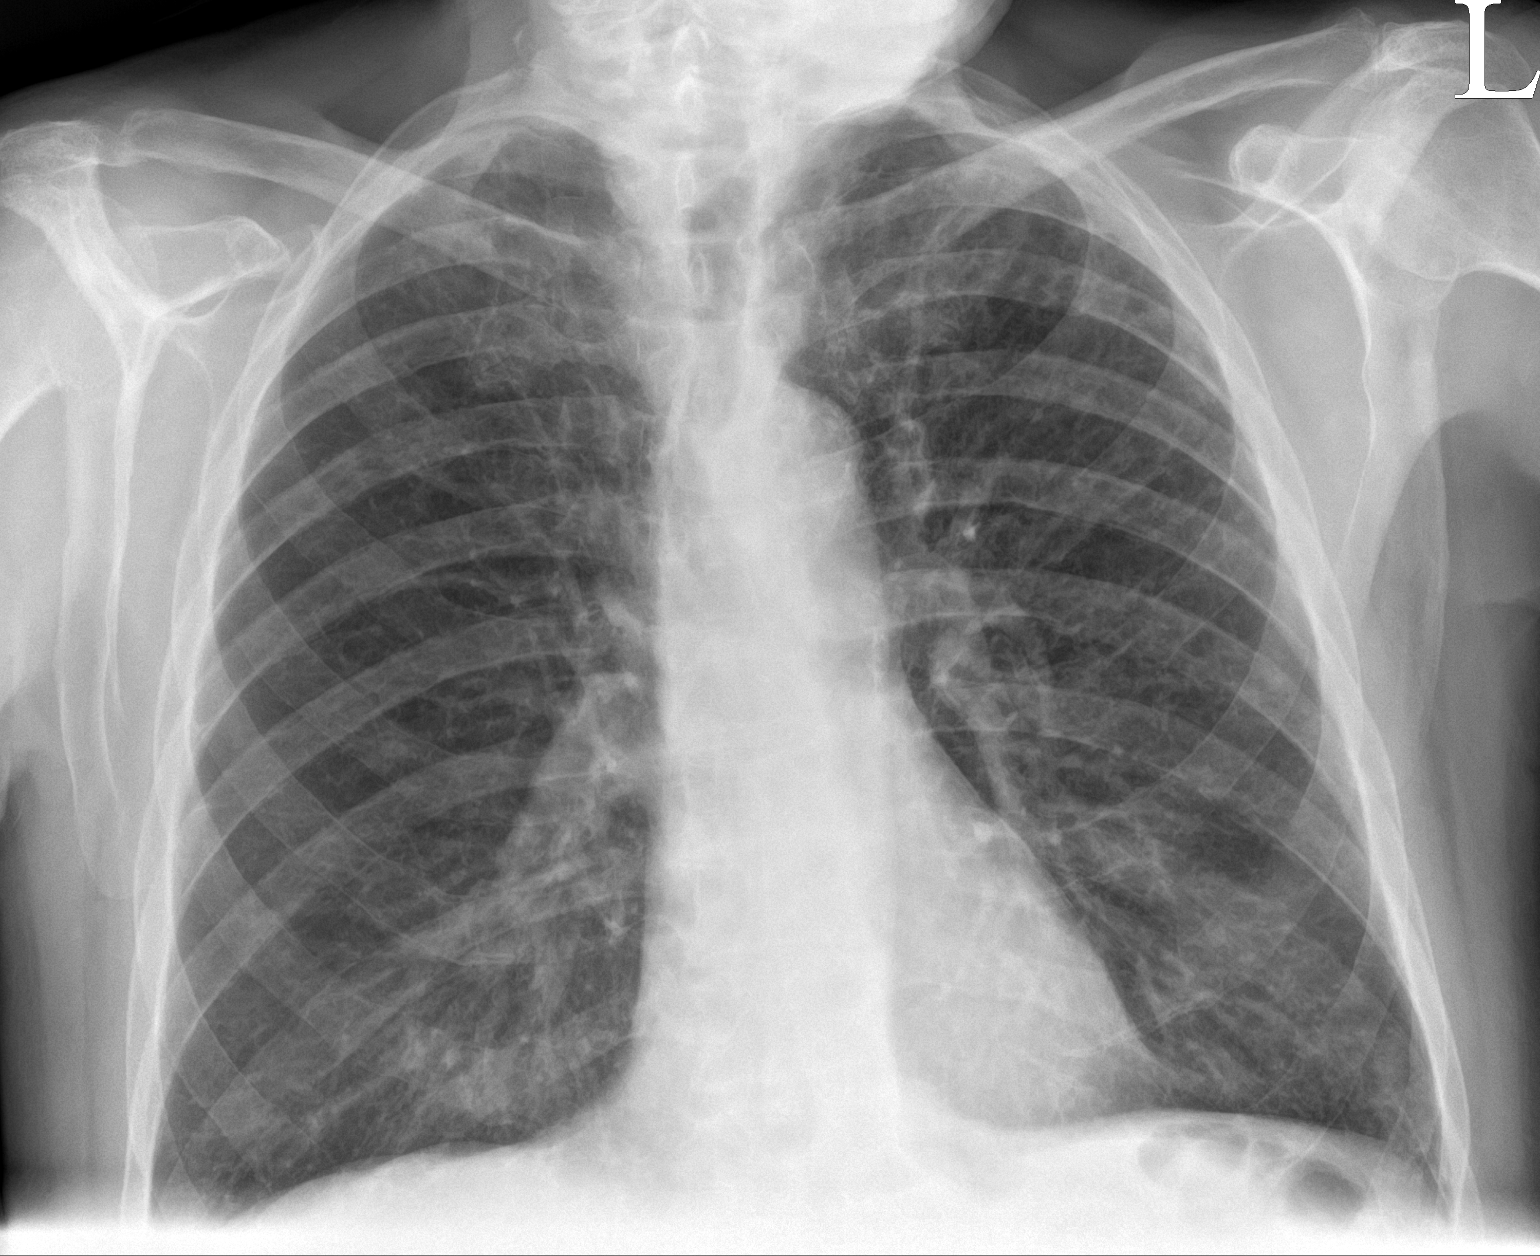

[chest lat]
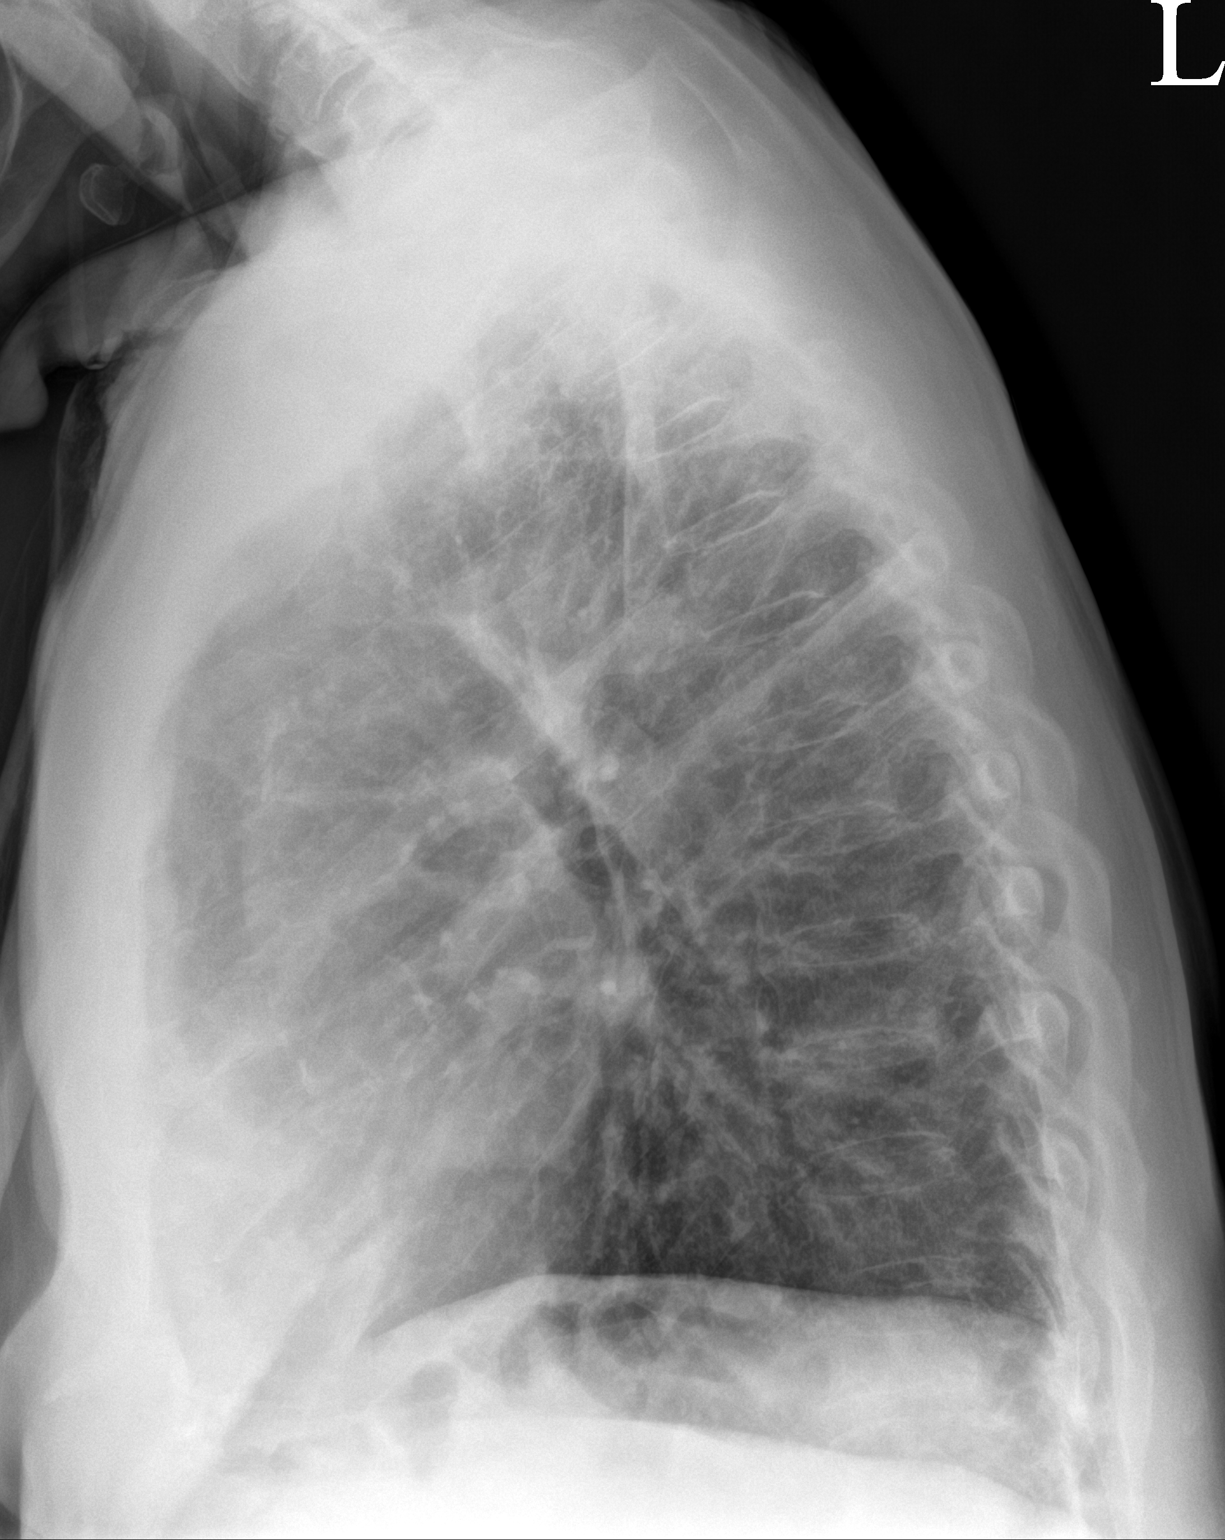

[2 of 2 positions shown; findings below may reference images not displayed]

FINDINGS: Cardiomediastinal silhouette unchanged in size and contour. No
evidence of central vascular congestion. No interlobular septal
thickening.

Reticulonodular opacities in the upper lungs and in the lower lungs
bilaterally.

Stigmata of emphysema, with increased retrosternal airspace,
flattened hemidiaphragms, increased AP diameter, and hyperinflation
on the AP view.

No pneumothorax or pleural effusion. Coarsened interstitial
markings.

No acute displaced fracture. Degenerative changes of the spine.
IMPRESSION: Emphysema, with reticulonodular opacities increased from the prior
concerning for multifocal infection.

## 2023-02-13 DIAGNOSIS — J449 Chronic obstructive pulmonary disease, unspecified: Secondary | ICD-10-CM | POA: Diagnosis not present

## 2023-02-13 DIAGNOSIS — E119 Type 2 diabetes mellitus without complications: Secondary | ICD-10-CM | POA: Diagnosis not present

## 2023-02-13 DIAGNOSIS — I2510 Atherosclerotic heart disease of native coronary artery without angina pectoris: Secondary | ICD-10-CM | POA: Diagnosis not present

## 2023-02-13 DIAGNOSIS — I119 Hypertensive heart disease without heart failure: Secondary | ICD-10-CM | POA: Diagnosis not present

## 2023-02-13 DIAGNOSIS — Z950 Presence of cardiac pacemaker: Secondary | ICD-10-CM | POA: Diagnosis not present

## 2023-02-13 DIAGNOSIS — Z66 Do not resuscitate: Secondary | ICD-10-CM | POA: Diagnosis not present

## 2023-02-14 DIAGNOSIS — E119 Type 2 diabetes mellitus without complications: Secondary | ICD-10-CM | POA: Diagnosis not present

## 2023-02-14 DIAGNOSIS — I119 Hypertensive heart disease without heart failure: Secondary | ICD-10-CM | POA: Diagnosis not present

## 2023-02-14 DIAGNOSIS — Z66 Do not resuscitate: Secondary | ICD-10-CM | POA: Diagnosis not present

## 2023-02-14 DIAGNOSIS — Z950 Presence of cardiac pacemaker: Secondary | ICD-10-CM | POA: Diagnosis not present

## 2023-02-14 DIAGNOSIS — J449 Chronic obstructive pulmonary disease, unspecified: Secondary | ICD-10-CM | POA: Diagnosis not present

## 2023-02-14 DIAGNOSIS — I2510 Atherosclerotic heart disease of native coronary artery without angina pectoris: Secondary | ICD-10-CM | POA: Diagnosis not present

## 2023-02-17 DIAGNOSIS — Z950 Presence of cardiac pacemaker: Secondary | ICD-10-CM | POA: Diagnosis not present

## 2023-02-17 DIAGNOSIS — I2510 Atherosclerotic heart disease of native coronary artery without angina pectoris: Secondary | ICD-10-CM | POA: Diagnosis not present

## 2023-02-17 DIAGNOSIS — E119 Type 2 diabetes mellitus without complications: Secondary | ICD-10-CM | POA: Diagnosis not present

## 2023-02-17 DIAGNOSIS — I119 Hypertensive heart disease without heart failure: Secondary | ICD-10-CM | POA: Diagnosis not present

## 2023-02-17 DIAGNOSIS — Z66 Do not resuscitate: Secondary | ICD-10-CM | POA: Diagnosis not present

## 2023-02-17 DIAGNOSIS — J449 Chronic obstructive pulmonary disease, unspecified: Secondary | ICD-10-CM | POA: Diagnosis not present

## 2023-02-18 DIAGNOSIS — J449 Chronic obstructive pulmonary disease, unspecified: Secondary | ICD-10-CM | POA: Diagnosis not present

## 2023-02-18 DIAGNOSIS — I2510 Atherosclerotic heart disease of native coronary artery without angina pectoris: Secondary | ICD-10-CM | POA: Diagnosis not present

## 2023-02-18 DIAGNOSIS — Z66 Do not resuscitate: Secondary | ICD-10-CM | POA: Diagnosis not present

## 2023-02-18 DIAGNOSIS — I119 Hypertensive heart disease without heart failure: Secondary | ICD-10-CM | POA: Diagnosis not present

## 2023-02-18 DIAGNOSIS — Z950 Presence of cardiac pacemaker: Secondary | ICD-10-CM | POA: Diagnosis not present

## 2023-02-18 DIAGNOSIS — E119 Type 2 diabetes mellitus without complications: Secondary | ICD-10-CM | POA: Diagnosis not present

## 2023-02-20 DIAGNOSIS — E119 Type 2 diabetes mellitus without complications: Secondary | ICD-10-CM | POA: Diagnosis not present

## 2023-02-20 DIAGNOSIS — Z66 Do not resuscitate: Secondary | ICD-10-CM | POA: Diagnosis not present

## 2023-02-20 DIAGNOSIS — J449 Chronic obstructive pulmonary disease, unspecified: Secondary | ICD-10-CM | POA: Diagnosis not present

## 2023-02-20 DIAGNOSIS — I119 Hypertensive heart disease without heart failure: Secondary | ICD-10-CM | POA: Diagnosis not present

## 2023-02-20 DIAGNOSIS — Z950 Presence of cardiac pacemaker: Secondary | ICD-10-CM | POA: Diagnosis not present

## 2023-02-20 DIAGNOSIS — I2510 Atherosclerotic heart disease of native coronary artery without angina pectoris: Secondary | ICD-10-CM | POA: Diagnosis not present

## 2023-02-21 ENCOUNTER — Ambulatory Visit (INDEPENDENT_AMBULATORY_CARE_PROVIDER_SITE_OTHER): Payer: Medicare Other

## 2023-02-21 DIAGNOSIS — I679 Cerebrovascular disease, unspecified: Secondary | ICD-10-CM

## 2023-02-21 DIAGNOSIS — Z950 Presence of cardiac pacemaker: Secondary | ICD-10-CM | POA: Diagnosis not present

## 2023-02-21 DIAGNOSIS — I2510 Atherosclerotic heart disease of native coronary artery without angina pectoris: Secondary | ICD-10-CM | POA: Diagnosis not present

## 2023-02-21 DIAGNOSIS — E119 Type 2 diabetes mellitus without complications: Secondary | ICD-10-CM | POA: Diagnosis not present

## 2023-02-21 DIAGNOSIS — I119 Hypertensive heart disease without heart failure: Secondary | ICD-10-CM | POA: Diagnosis not present

## 2023-02-21 DIAGNOSIS — Z66 Do not resuscitate: Secondary | ICD-10-CM | POA: Diagnosis not present

## 2023-02-21 DIAGNOSIS — J449 Chronic obstructive pulmonary disease, unspecified: Secondary | ICD-10-CM | POA: Diagnosis not present

## 2023-02-24 LAB — CUP PACEART REMOTE DEVICE CHECK
Battery Remaining Longevity: 97 mo
Battery Remaining Percentage: 95.5 %
Battery Voltage: 3.02 V
Brady Statistic AP VP Percent: 1 %
Brady Statistic AP VS Percent: 6.4 %
Brady Statistic AS VP Percent: 1 %
Brady Statistic AS VS Percent: 92 %
Brady Statistic RA Percent Paced: 4.5 %
Brady Statistic RV Percent Paced: 1 %
Date Time Interrogation Session: 20240429115026
Implantable Lead Connection Status: 753985
Implantable Lead Connection Status: 753985
Implantable Lead Implant Date: 20230706
Implantable Lead Implant Date: 20230706
Implantable Lead Location: 753859
Implantable Lead Location: 753860
Implantable Pulse Generator Implant Date: 20230706
Lead Channel Impedance Value: 530 Ohm
Lead Channel Impedance Value: 590 Ohm
Lead Channel Pacing Threshold Amplitude: 0.5 V
Lead Channel Pacing Threshold Amplitude: 0.5 V
Lead Channel Pacing Threshold Pulse Width: 0.3 ms
Lead Channel Pacing Threshold Pulse Width: 0.5 ms
Lead Channel Sensing Intrinsic Amplitude: 12 mV
Lead Channel Sensing Intrinsic Amplitude: 5 mV
Lead Channel Setting Pacing Amplitude: 3.5 V
Lead Channel Setting Pacing Amplitude: 3.5 V
Lead Channel Setting Pacing Pulse Width: 0.5 ms
Lead Channel Setting Sensing Sensitivity: 2 mV
Pulse Gen Model: 2272
Pulse Gen Serial Number: 8098905

## 2023-02-25 ENCOUNTER — Telehealth: Payer: Self-pay | Admitting: *Deleted

## 2023-02-25 DIAGNOSIS — Z66 Do not resuscitate: Secondary | ICD-10-CM | POA: Diagnosis not present

## 2023-02-25 DIAGNOSIS — E119 Type 2 diabetes mellitus without complications: Secondary | ICD-10-CM | POA: Diagnosis not present

## 2023-02-25 DIAGNOSIS — Z950 Presence of cardiac pacemaker: Secondary | ICD-10-CM | POA: Diagnosis not present

## 2023-02-25 DIAGNOSIS — I119 Hypertensive heart disease without heart failure: Secondary | ICD-10-CM | POA: Diagnosis not present

## 2023-02-25 DIAGNOSIS — J449 Chronic obstructive pulmonary disease, unspecified: Secondary | ICD-10-CM | POA: Diagnosis not present

## 2023-02-25 DIAGNOSIS — I2510 Atherosclerotic heart disease of native coronary artery without angina pectoris: Secondary | ICD-10-CM | POA: Diagnosis not present

## 2023-02-25 MED ORDER — METOPROLOL SUCCINATE ER 25 MG PO TB24: 25.0000 mg | ORAL_TABLET | Freq: Every day | ORAL | 3 refills | Status: DC

## 2023-02-25 NOTE — Telephone Encounter (Signed)
-----   Message from Lewayne Bunting, MD sent at 02/24/2023  3:42 PM EDT ----- Add toprol 25 mg daily Olga Millers

## 2023-02-25 NOTE — Telephone Encounter (Signed)
Spoke with pt son, Aware of dr Ludwig Clarks recommendations.  New script sent to the pharmacy  Patient is currently in hospice and he will have them call if needed for the prescription.

## 2023-02-25 NOTE — Telephone Encounter (Signed)
Spoke with christian paige, from hospice, order for metoprolol given.

## 2023-02-25 NOTE — Telephone Encounter (Signed)
Left message for pt daughter to call  ?

## 2023-02-26 DIAGNOSIS — J449 Chronic obstructive pulmonary disease, unspecified: Secondary | ICD-10-CM | POA: Diagnosis not present

## 2023-02-26 DIAGNOSIS — Z66 Do not resuscitate: Secondary | ICD-10-CM | POA: Diagnosis not present

## 2023-02-26 DIAGNOSIS — Z950 Presence of cardiac pacemaker: Secondary | ICD-10-CM | POA: Diagnosis not present

## 2023-02-26 DIAGNOSIS — I119 Hypertensive heart disease without heart failure: Secondary | ICD-10-CM | POA: Diagnosis not present

## 2023-02-26 DIAGNOSIS — I2510 Atherosclerotic heart disease of native coronary artery without angina pectoris: Secondary | ICD-10-CM | POA: Diagnosis not present

## 2023-02-26 DIAGNOSIS — E119 Type 2 diabetes mellitus without complications: Secondary | ICD-10-CM | POA: Diagnosis not present

## 2023-02-27 DIAGNOSIS — E119 Type 2 diabetes mellitus without complications: Secondary | ICD-10-CM | POA: Diagnosis not present

## 2023-02-27 DIAGNOSIS — I2510 Atherosclerotic heart disease of native coronary artery without angina pectoris: Secondary | ICD-10-CM | POA: Diagnosis not present

## 2023-02-27 DIAGNOSIS — Z950 Presence of cardiac pacemaker: Secondary | ICD-10-CM | POA: Diagnosis not present

## 2023-02-27 DIAGNOSIS — I119 Hypertensive heart disease without heart failure: Secondary | ICD-10-CM | POA: Diagnosis not present

## 2023-02-27 DIAGNOSIS — J449 Chronic obstructive pulmonary disease, unspecified: Secondary | ICD-10-CM | POA: Diagnosis not present

## 2023-02-27 DIAGNOSIS — Z66 Do not resuscitate: Secondary | ICD-10-CM | POA: Diagnosis not present

## 2023-02-28 DIAGNOSIS — I2510 Atherosclerotic heart disease of native coronary artery without angina pectoris: Secondary | ICD-10-CM | POA: Diagnosis not present

## 2023-02-28 DIAGNOSIS — Z66 Do not resuscitate: Secondary | ICD-10-CM | POA: Diagnosis not present

## 2023-02-28 DIAGNOSIS — E119 Type 2 diabetes mellitus without complications: Secondary | ICD-10-CM | POA: Diagnosis not present

## 2023-02-28 DIAGNOSIS — I119 Hypertensive heart disease without heart failure: Secondary | ICD-10-CM | POA: Diagnosis not present

## 2023-02-28 DIAGNOSIS — Z950 Presence of cardiac pacemaker: Secondary | ICD-10-CM | POA: Diagnosis not present

## 2023-02-28 DIAGNOSIS — J449 Chronic obstructive pulmonary disease, unspecified: Secondary | ICD-10-CM | POA: Diagnosis not present

## 2023-03-03 ENCOUNTER — Telehealth: Payer: Self-pay

## 2023-03-03 DIAGNOSIS — Z950 Presence of cardiac pacemaker: Secondary | ICD-10-CM | POA: Diagnosis not present

## 2023-03-03 DIAGNOSIS — J449 Chronic obstructive pulmonary disease, unspecified: Secondary | ICD-10-CM | POA: Diagnosis not present

## 2023-03-03 DIAGNOSIS — E119 Type 2 diabetes mellitus without complications: Secondary | ICD-10-CM | POA: Diagnosis not present

## 2023-03-03 DIAGNOSIS — Z66 Do not resuscitate: Secondary | ICD-10-CM | POA: Diagnosis not present

## 2023-03-03 DIAGNOSIS — I119 Hypertensive heart disease without heart failure: Secondary | ICD-10-CM | POA: Diagnosis not present

## 2023-03-03 DIAGNOSIS — I2510 Atherosclerotic heart disease of native coronary artery without angina pectoris: Secondary | ICD-10-CM | POA: Diagnosis not present

## 2023-03-03 NOTE — Telephone Encounter (Signed)
Unable to leave voice mail for pt to call back to schedule Medicare Wellness Visit

## 2023-03-04 DIAGNOSIS — E119 Type 2 diabetes mellitus without complications: Secondary | ICD-10-CM | POA: Diagnosis not present

## 2023-03-04 DIAGNOSIS — I2510 Atherosclerotic heart disease of native coronary artery without angina pectoris: Secondary | ICD-10-CM | POA: Diagnosis not present

## 2023-03-04 DIAGNOSIS — I119 Hypertensive heart disease without heart failure: Secondary | ICD-10-CM | POA: Diagnosis not present

## 2023-03-04 DIAGNOSIS — Z950 Presence of cardiac pacemaker: Secondary | ICD-10-CM | POA: Diagnosis not present

## 2023-03-04 DIAGNOSIS — J449 Chronic obstructive pulmonary disease, unspecified: Secondary | ICD-10-CM | POA: Diagnosis not present

## 2023-03-04 DIAGNOSIS — Z66 Do not resuscitate: Secondary | ICD-10-CM | POA: Diagnosis not present

## 2023-03-06 DIAGNOSIS — Z66 Do not resuscitate: Secondary | ICD-10-CM | POA: Diagnosis not present

## 2023-03-06 DIAGNOSIS — Z950 Presence of cardiac pacemaker: Secondary | ICD-10-CM | POA: Diagnosis not present

## 2023-03-06 DIAGNOSIS — I119 Hypertensive heart disease without heart failure: Secondary | ICD-10-CM | POA: Diagnosis not present

## 2023-03-06 DIAGNOSIS — J449 Chronic obstructive pulmonary disease, unspecified: Secondary | ICD-10-CM | POA: Diagnosis not present

## 2023-03-06 DIAGNOSIS — I2510 Atherosclerotic heart disease of native coronary artery without angina pectoris: Secondary | ICD-10-CM | POA: Diagnosis not present

## 2023-03-06 DIAGNOSIS — E119 Type 2 diabetes mellitus without complications: Secondary | ICD-10-CM | POA: Diagnosis not present

## 2023-03-10 DIAGNOSIS — Z950 Presence of cardiac pacemaker: Secondary | ICD-10-CM | POA: Diagnosis not present

## 2023-03-10 DIAGNOSIS — J449 Chronic obstructive pulmonary disease, unspecified: Secondary | ICD-10-CM | POA: Diagnosis not present

## 2023-03-10 DIAGNOSIS — E119 Type 2 diabetes mellitus without complications: Secondary | ICD-10-CM | POA: Diagnosis not present

## 2023-03-10 DIAGNOSIS — Z66 Do not resuscitate: Secondary | ICD-10-CM | POA: Diagnosis not present

## 2023-03-10 DIAGNOSIS — I2510 Atherosclerotic heart disease of native coronary artery without angina pectoris: Secondary | ICD-10-CM | POA: Diagnosis not present

## 2023-03-10 DIAGNOSIS — I119 Hypertensive heart disease without heart failure: Secondary | ICD-10-CM | POA: Diagnosis not present

## 2023-03-10 IMAGING — CT CT CHEST W/O CM
1 series · 14 of 34 positions shown, 18 images · non-contrast
Comparison: 08/16/2021 chest radiograph. 06/04/2020 chest CT
angiogram.

CLINICAL DATA: Abnormal chest radiograph with increasing
reticulonodular opacities. Follow-up pulmonary nodule. Worsening
fatigue, hypoxia the, dyspnea on exertion. Current smoker. COPD.

EXAM:
CT CHEST WITHOUT CONTRAST
TECHNIQUE: Multidetector CT imaging of the chest was performed following the
standard protocol without IV contrast.

[Series 2: chest w/(date) · axial · 0.73mm/px · z∈[-41,+235]mm · 14 of 162 slices shown, 18 images]
[im 12/162  mediastinal]
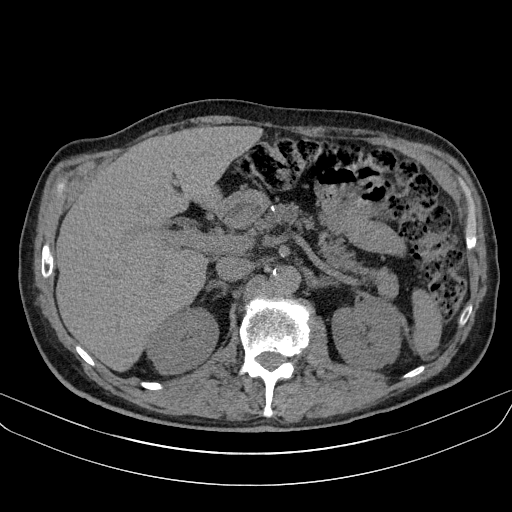
[im 12/162  lung]
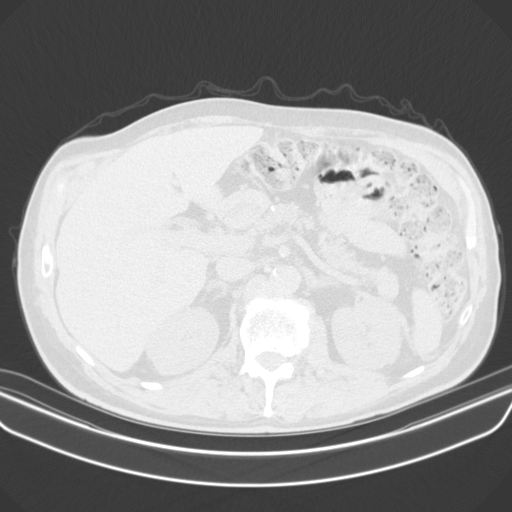
[im 24/162  lung]
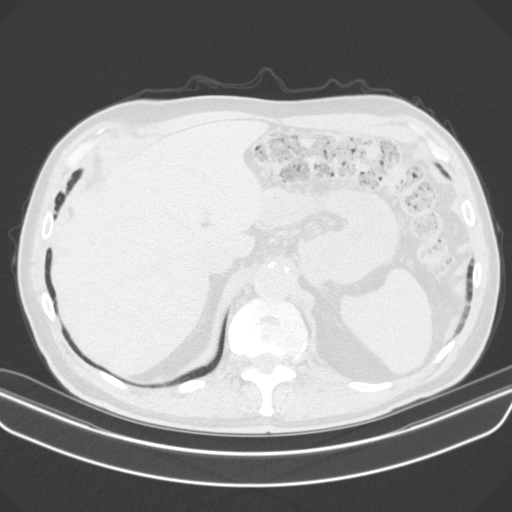
[im 33/162  lung]
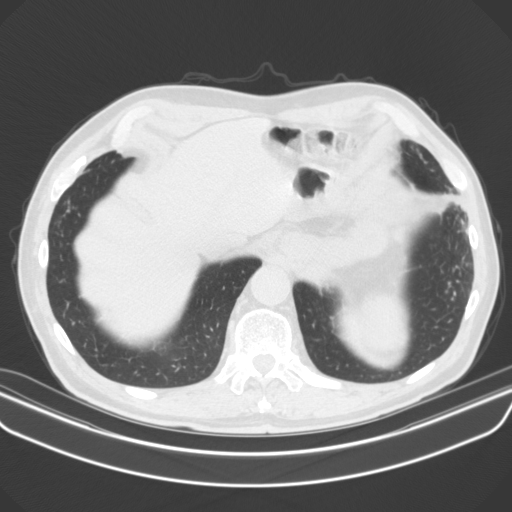
[im 48/162  lung]
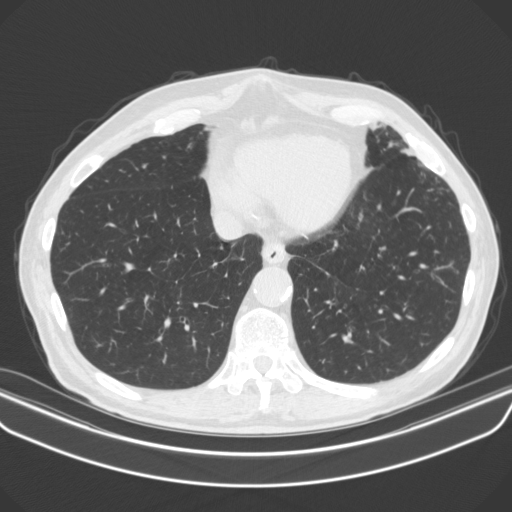
[im 60/162  mediastinal]
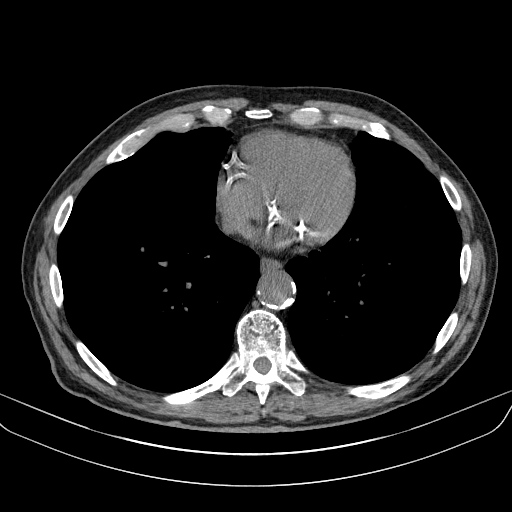
[im 60/162  lung]
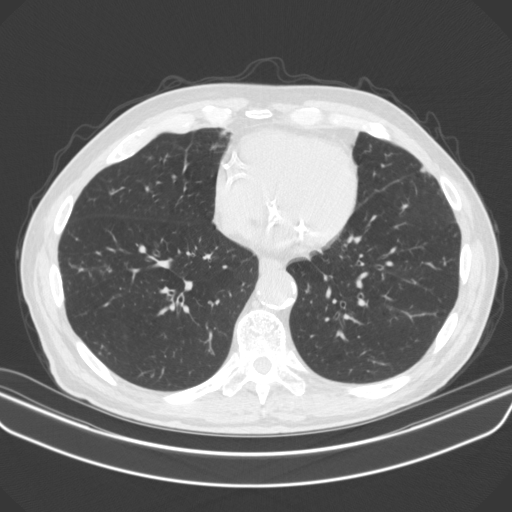
[im 66/162  lung]
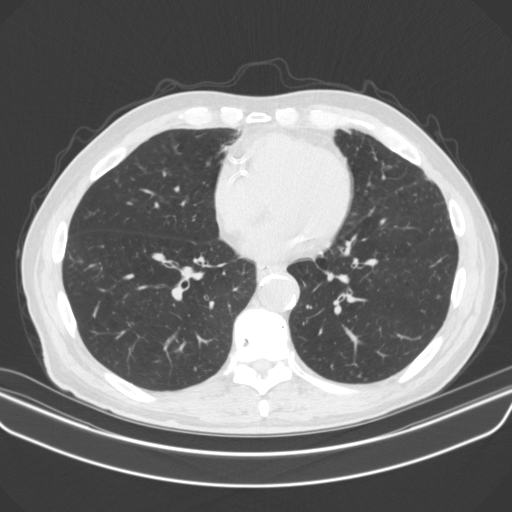
[im 77/162  lung]
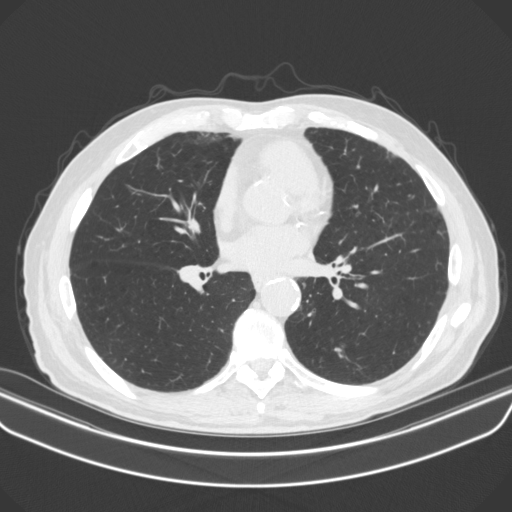
[im 86/162  lung]
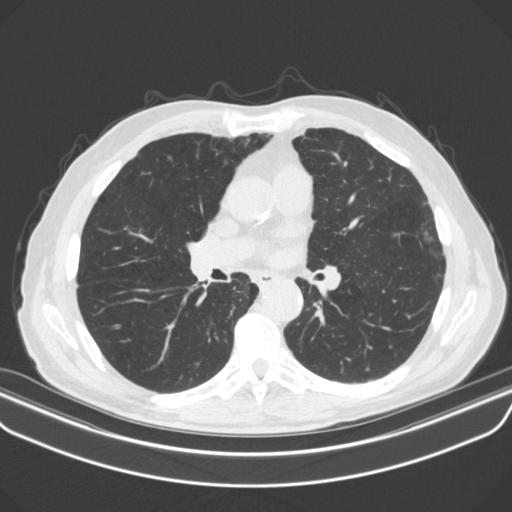
[im 96/162  mediastinal]
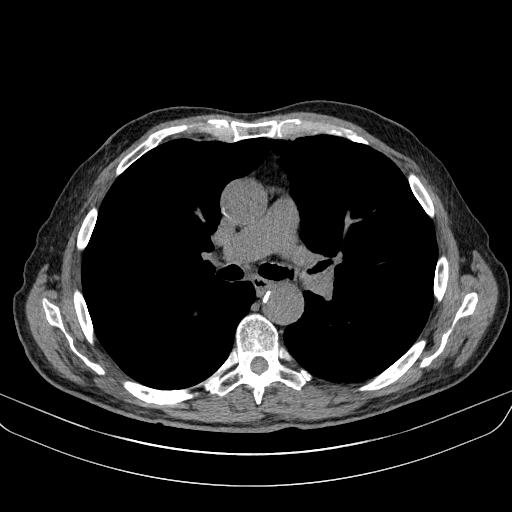
[im 96/162  lung]
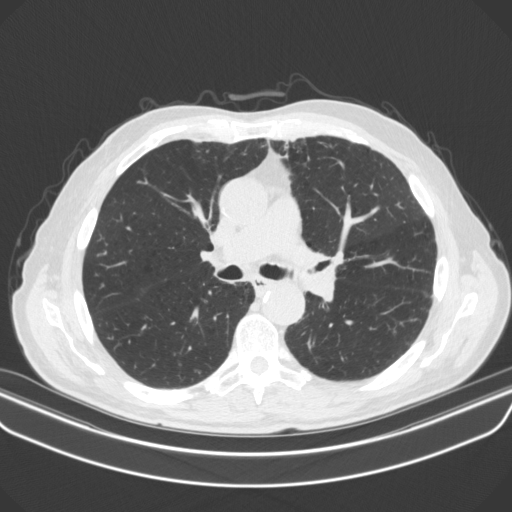
[im 102/162  lung]
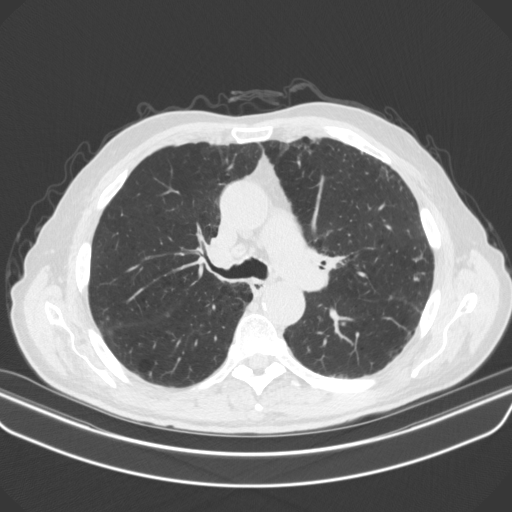
[im 120/162  lung]
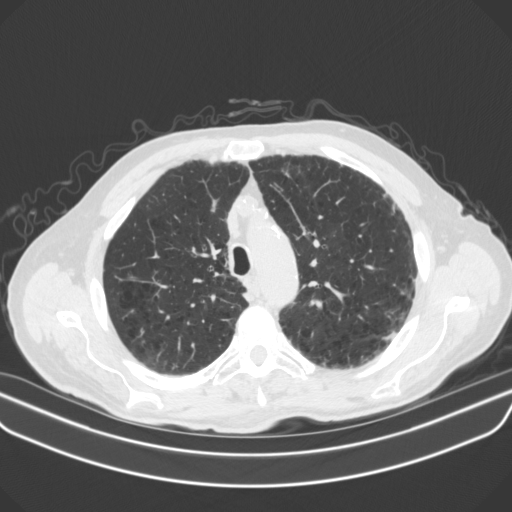
[im 129/162  lung]
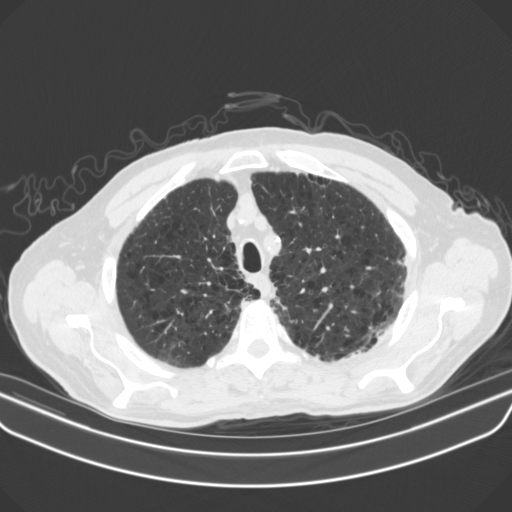
[im 138/162  mediastinal]
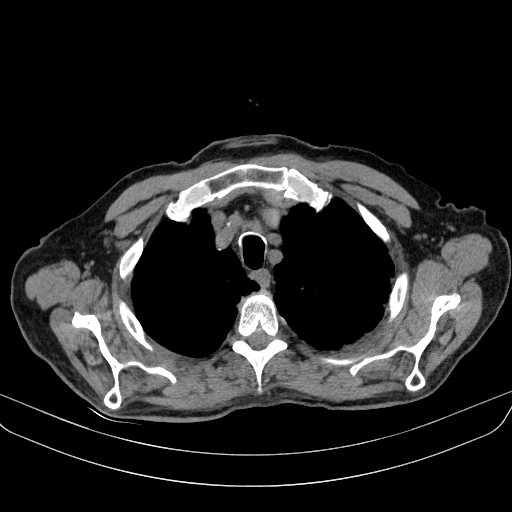
[im 138/162  lung]
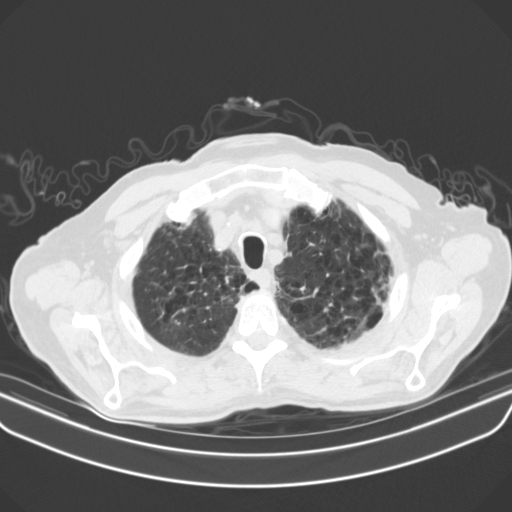
[im 150/162  lung]
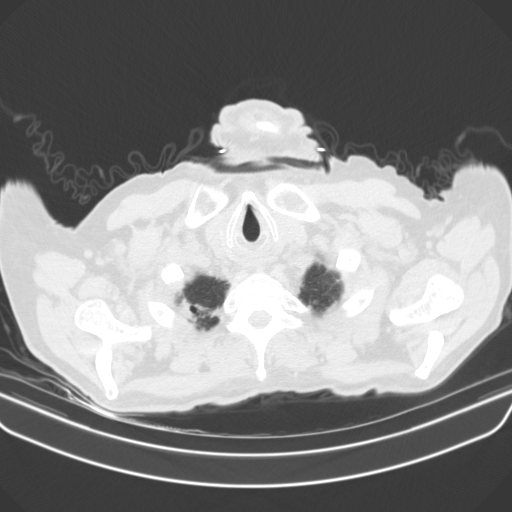

[14 of 34 positions shown; findings below may reference images not displayed]

FINDINGS: Cardiovascular: Normal heart size. No significant pericardial
effusion/thickening. Three-vessel coronary atherosclerosis.
Atherosclerotic nonaneurysmal thoracic aorta. Normal caliber
pulmonary arteries.

Mediastinum/Nodes: No discrete thyroid nodules. Unremarkable
esophagus. No pathologically enlarged axillary, mediastinal or hilar
lymph nodes, noting limited sensitivity for the detection of hilar
adenopathy on this noncontrast study.

Lungs/Pleura: No pneumothorax. No pleural effusion. Moderate
centrilobular and paraseptal emphysema with diffuse bronchial wall
thickening and saber sheath trachea. Patchy mild-to-moderate
tree-in-bud opacities with associated mild cylindrical
bronchiectasis throughout the periphery of both lungs, most
prominent in the anteromedial bilateral upper lobes and peripheral
basilar left lower lobe. These findings are overall mildly
progressive since 06/04/2020 chest CT. Previously described
representative pulmonary nodule in the medial aspect of the superior
segment right lower lobe measures 0.4 cm (series 5/image 81), stable
using similar measurement technique. Apical right upper lobe 0.6 cm
indistinct nodule (series 5/image 20) is new.

Upper abdomen: Cholelithiasis. Subcentimeter coarse right liver
calcification, new. Scattered vascular calcifications in the upper
renal sinuses bilaterally.

Musculoskeletal: No aggressive appearing focal osseous lesions. Mild
thoracic spondylosis.
IMPRESSION: 1. Spectrum of findings compatible with chronic infectious
bronchiolitis due to atypical mycobacterial infection (FAREED) with
mild-to-moderate tree-in-bud opacities and mild cylindrical
bronchiectasis, mildly progressive since 06/04/2020 chest CT.
2. New 0.6 cm indistinct apical right upper lobe pulmonary nodule,
nonspecific, potentially due to suspected FAREED. Recommend attention
on follow-up chest CT in 3-6 months in this high risk patient.
3. Three-vessel coronary atherosclerosis.
4. Cholelithiasis.
5. Aortic Atherosclerosis (FD2HO-RTG.G) and Emphysema (FD2HO-A3W.O).

## 2023-03-11 DIAGNOSIS — Z950 Presence of cardiac pacemaker: Secondary | ICD-10-CM | POA: Diagnosis not present

## 2023-03-11 DIAGNOSIS — Z66 Do not resuscitate: Secondary | ICD-10-CM | POA: Diagnosis not present

## 2023-03-11 DIAGNOSIS — I119 Hypertensive heart disease without heart failure: Secondary | ICD-10-CM | POA: Diagnosis not present

## 2023-03-11 DIAGNOSIS — J449 Chronic obstructive pulmonary disease, unspecified: Secondary | ICD-10-CM | POA: Diagnosis not present

## 2023-03-11 DIAGNOSIS — I2510 Atherosclerotic heart disease of native coronary artery without angina pectoris: Secondary | ICD-10-CM | POA: Diagnosis not present

## 2023-03-11 DIAGNOSIS — E119 Type 2 diabetes mellitus without complications: Secondary | ICD-10-CM | POA: Diagnosis not present

## 2023-03-14 DIAGNOSIS — I119 Hypertensive heart disease without heart failure: Secondary | ICD-10-CM | POA: Diagnosis not present

## 2023-03-14 DIAGNOSIS — J449 Chronic obstructive pulmonary disease, unspecified: Secondary | ICD-10-CM | POA: Diagnosis not present

## 2023-03-14 DIAGNOSIS — Z950 Presence of cardiac pacemaker: Secondary | ICD-10-CM | POA: Diagnosis not present

## 2023-03-14 DIAGNOSIS — I2510 Atherosclerotic heart disease of native coronary artery without angina pectoris: Secondary | ICD-10-CM | POA: Diagnosis not present

## 2023-03-14 DIAGNOSIS — Z66 Do not resuscitate: Secondary | ICD-10-CM | POA: Diagnosis not present

## 2023-03-14 DIAGNOSIS — E119 Type 2 diabetes mellitus without complications: Secondary | ICD-10-CM | POA: Diagnosis not present

## 2023-03-18 DIAGNOSIS — I2510 Atherosclerotic heart disease of native coronary artery without angina pectoris: Secondary | ICD-10-CM | POA: Diagnosis not present

## 2023-03-18 DIAGNOSIS — Z950 Presence of cardiac pacemaker: Secondary | ICD-10-CM | POA: Diagnosis not present

## 2023-03-18 DIAGNOSIS — J449 Chronic obstructive pulmonary disease, unspecified: Secondary | ICD-10-CM | POA: Diagnosis not present

## 2023-03-18 DIAGNOSIS — Z66 Do not resuscitate: Secondary | ICD-10-CM | POA: Diagnosis not present

## 2023-03-18 DIAGNOSIS — E119 Type 2 diabetes mellitus without complications: Secondary | ICD-10-CM | POA: Diagnosis not present

## 2023-03-18 DIAGNOSIS — I119 Hypertensive heart disease without heart failure: Secondary | ICD-10-CM | POA: Diagnosis not present

## 2023-03-18 NOTE — Progress Notes (Signed)
Remote pacemaker transmission.   

## 2023-03-20 DIAGNOSIS — E119 Type 2 diabetes mellitus without complications: Secondary | ICD-10-CM | POA: Diagnosis not present

## 2023-03-20 DIAGNOSIS — Z950 Presence of cardiac pacemaker: Secondary | ICD-10-CM | POA: Diagnosis not present

## 2023-03-20 DIAGNOSIS — I2510 Atherosclerotic heart disease of native coronary artery without angina pectoris: Secondary | ICD-10-CM | POA: Diagnosis not present

## 2023-03-20 DIAGNOSIS — I119 Hypertensive heart disease without heart failure: Secondary | ICD-10-CM | POA: Diagnosis not present

## 2023-03-20 DIAGNOSIS — Z66 Do not resuscitate: Secondary | ICD-10-CM | POA: Diagnosis not present

## 2023-03-20 DIAGNOSIS — J449 Chronic obstructive pulmonary disease, unspecified: Secondary | ICD-10-CM | POA: Diagnosis not present

## 2023-03-21 DIAGNOSIS — I119 Hypertensive heart disease without heart failure: Secondary | ICD-10-CM | POA: Diagnosis not present

## 2023-03-21 DIAGNOSIS — E119 Type 2 diabetes mellitus without complications: Secondary | ICD-10-CM | POA: Diagnosis not present

## 2023-03-21 DIAGNOSIS — Z66 Do not resuscitate: Secondary | ICD-10-CM | POA: Diagnosis not present

## 2023-03-21 DIAGNOSIS — Z950 Presence of cardiac pacemaker: Secondary | ICD-10-CM | POA: Diagnosis not present

## 2023-03-21 DIAGNOSIS — J449 Chronic obstructive pulmonary disease, unspecified: Secondary | ICD-10-CM | POA: Diagnosis not present

## 2023-03-21 DIAGNOSIS — I2510 Atherosclerotic heart disease of native coronary artery without angina pectoris: Secondary | ICD-10-CM | POA: Diagnosis not present

## 2023-03-25 DIAGNOSIS — I2510 Atherosclerotic heart disease of native coronary artery without angina pectoris: Secondary | ICD-10-CM | POA: Diagnosis not present

## 2023-03-25 DIAGNOSIS — Z950 Presence of cardiac pacemaker: Secondary | ICD-10-CM | POA: Diagnosis not present

## 2023-03-25 DIAGNOSIS — E119 Type 2 diabetes mellitus without complications: Secondary | ICD-10-CM | POA: Diagnosis not present

## 2023-03-25 DIAGNOSIS — J449 Chronic obstructive pulmonary disease, unspecified: Secondary | ICD-10-CM | POA: Diagnosis not present

## 2023-03-25 DIAGNOSIS — I119 Hypertensive heart disease without heart failure: Secondary | ICD-10-CM | POA: Diagnosis not present

## 2023-03-25 DIAGNOSIS — Z66 Do not resuscitate: Secondary | ICD-10-CM | POA: Diagnosis not present

## 2023-03-27 DIAGNOSIS — I2510 Atherosclerotic heart disease of native coronary artery without angina pectoris: Secondary | ICD-10-CM | POA: Diagnosis not present

## 2023-03-27 DIAGNOSIS — I119 Hypertensive heart disease without heart failure: Secondary | ICD-10-CM | POA: Diagnosis not present

## 2023-03-27 DIAGNOSIS — Z66 Do not resuscitate: Secondary | ICD-10-CM | POA: Diagnosis not present

## 2023-03-27 DIAGNOSIS — E119 Type 2 diabetes mellitus without complications: Secondary | ICD-10-CM | POA: Diagnosis not present

## 2023-03-27 DIAGNOSIS — J449 Chronic obstructive pulmonary disease, unspecified: Secondary | ICD-10-CM | POA: Diagnosis not present

## 2023-03-27 DIAGNOSIS — Z950 Presence of cardiac pacemaker: Secondary | ICD-10-CM | POA: Diagnosis not present

## 2023-03-28 DIAGNOSIS — Z950 Presence of cardiac pacemaker: Secondary | ICD-10-CM | POA: Diagnosis not present

## 2023-03-28 DIAGNOSIS — Z66 Do not resuscitate: Secondary | ICD-10-CM | POA: Diagnosis not present

## 2023-03-28 DIAGNOSIS — I2510 Atherosclerotic heart disease of native coronary artery without angina pectoris: Secondary | ICD-10-CM | POA: Diagnosis not present

## 2023-03-28 DIAGNOSIS — I119 Hypertensive heart disease without heart failure: Secondary | ICD-10-CM | POA: Diagnosis not present

## 2023-03-28 DIAGNOSIS — E119 Type 2 diabetes mellitus without complications: Secondary | ICD-10-CM | POA: Diagnosis not present

## 2023-03-28 DIAGNOSIS — J449 Chronic obstructive pulmonary disease, unspecified: Secondary | ICD-10-CM | POA: Diagnosis not present

## 2023-03-29 DIAGNOSIS — I119 Hypertensive heart disease without heart failure: Secondary | ICD-10-CM | POA: Diagnosis not present

## 2023-03-29 DIAGNOSIS — E119 Type 2 diabetes mellitus without complications: Secondary | ICD-10-CM | POA: Diagnosis not present

## 2023-03-29 DIAGNOSIS — J449 Chronic obstructive pulmonary disease, unspecified: Secondary | ICD-10-CM | POA: Diagnosis not present

## 2023-03-29 DIAGNOSIS — I2510 Atherosclerotic heart disease of native coronary artery without angina pectoris: Secondary | ICD-10-CM | POA: Diagnosis not present

## 2023-03-29 DIAGNOSIS — Z950 Presence of cardiac pacemaker: Secondary | ICD-10-CM | POA: Diagnosis not present

## 2023-03-29 DIAGNOSIS — Z66 Do not resuscitate: Secondary | ICD-10-CM | POA: Diagnosis not present

## 2023-03-31 DIAGNOSIS — E119 Type 2 diabetes mellitus without complications: Secondary | ICD-10-CM | POA: Diagnosis not present

## 2023-03-31 DIAGNOSIS — Z950 Presence of cardiac pacemaker: Secondary | ICD-10-CM | POA: Diagnosis not present

## 2023-03-31 DIAGNOSIS — I119 Hypertensive heart disease without heart failure: Secondary | ICD-10-CM | POA: Diagnosis not present

## 2023-03-31 DIAGNOSIS — Z66 Do not resuscitate: Secondary | ICD-10-CM | POA: Diagnosis not present

## 2023-03-31 DIAGNOSIS — J449 Chronic obstructive pulmonary disease, unspecified: Secondary | ICD-10-CM | POA: Diagnosis not present

## 2023-03-31 DIAGNOSIS — I2510 Atherosclerotic heart disease of native coronary artery without angina pectoris: Secondary | ICD-10-CM | POA: Diagnosis not present

## 2023-04-01 DIAGNOSIS — I2510 Atherosclerotic heart disease of native coronary artery without angina pectoris: Secondary | ICD-10-CM | POA: Diagnosis not present

## 2023-04-01 DIAGNOSIS — I119 Hypertensive heart disease without heart failure: Secondary | ICD-10-CM | POA: Diagnosis not present

## 2023-04-01 DIAGNOSIS — Z950 Presence of cardiac pacemaker: Secondary | ICD-10-CM | POA: Diagnosis not present

## 2023-04-01 DIAGNOSIS — E119 Type 2 diabetes mellitus without complications: Secondary | ICD-10-CM | POA: Diagnosis not present

## 2023-04-01 DIAGNOSIS — Z66 Do not resuscitate: Secondary | ICD-10-CM | POA: Diagnosis not present

## 2023-04-01 DIAGNOSIS — J449 Chronic obstructive pulmonary disease, unspecified: Secondary | ICD-10-CM | POA: Diagnosis not present

## 2023-04-03 DIAGNOSIS — Z950 Presence of cardiac pacemaker: Secondary | ICD-10-CM | POA: Diagnosis not present

## 2023-04-03 DIAGNOSIS — I119 Hypertensive heart disease without heart failure: Secondary | ICD-10-CM | POA: Diagnosis not present

## 2023-04-03 DIAGNOSIS — E119 Type 2 diabetes mellitus without complications: Secondary | ICD-10-CM | POA: Diagnosis not present

## 2023-04-03 DIAGNOSIS — Z66 Do not resuscitate: Secondary | ICD-10-CM | POA: Diagnosis not present

## 2023-04-03 DIAGNOSIS — J449 Chronic obstructive pulmonary disease, unspecified: Secondary | ICD-10-CM | POA: Diagnosis not present

## 2023-04-03 DIAGNOSIS — I2510 Atherosclerotic heart disease of native coronary artery without angina pectoris: Secondary | ICD-10-CM | POA: Diagnosis not present

## 2023-04-08 DIAGNOSIS — I2510 Atherosclerotic heart disease of native coronary artery without angina pectoris: Secondary | ICD-10-CM | POA: Diagnosis not present

## 2023-04-08 DIAGNOSIS — E119 Type 2 diabetes mellitus without complications: Secondary | ICD-10-CM | POA: Diagnosis not present

## 2023-04-08 DIAGNOSIS — Z950 Presence of cardiac pacemaker: Secondary | ICD-10-CM | POA: Diagnosis not present

## 2023-04-08 DIAGNOSIS — J449 Chronic obstructive pulmonary disease, unspecified: Secondary | ICD-10-CM | POA: Diagnosis not present

## 2023-04-08 DIAGNOSIS — I119 Hypertensive heart disease without heart failure: Secondary | ICD-10-CM | POA: Diagnosis not present

## 2023-04-08 DIAGNOSIS — Z66 Do not resuscitate: Secondary | ICD-10-CM | POA: Diagnosis not present

## 2023-04-11 DIAGNOSIS — I119 Hypertensive heart disease without heart failure: Secondary | ICD-10-CM | POA: Diagnosis not present

## 2023-04-11 DIAGNOSIS — I2510 Atherosclerotic heart disease of native coronary artery without angina pectoris: Secondary | ICD-10-CM | POA: Diagnosis not present

## 2023-04-11 DIAGNOSIS — E119 Type 2 diabetes mellitus without complications: Secondary | ICD-10-CM | POA: Diagnosis not present

## 2023-04-11 DIAGNOSIS — Z66 Do not resuscitate: Secondary | ICD-10-CM | POA: Diagnosis not present

## 2023-04-11 DIAGNOSIS — J449 Chronic obstructive pulmonary disease, unspecified: Secondary | ICD-10-CM | POA: Diagnosis not present

## 2023-04-11 DIAGNOSIS — Z950 Presence of cardiac pacemaker: Secondary | ICD-10-CM | POA: Diagnosis not present

## 2023-04-15 DIAGNOSIS — I2510 Atherosclerotic heart disease of native coronary artery without angina pectoris: Secondary | ICD-10-CM | POA: Diagnosis not present

## 2023-04-15 DIAGNOSIS — E119 Type 2 diabetes mellitus without complications: Secondary | ICD-10-CM | POA: Diagnosis not present

## 2023-04-15 DIAGNOSIS — J449 Chronic obstructive pulmonary disease, unspecified: Secondary | ICD-10-CM | POA: Diagnosis not present

## 2023-04-15 DIAGNOSIS — Z66 Do not resuscitate: Secondary | ICD-10-CM | POA: Diagnosis not present

## 2023-04-15 DIAGNOSIS — Z950 Presence of cardiac pacemaker: Secondary | ICD-10-CM | POA: Diagnosis not present

## 2023-04-15 DIAGNOSIS — I119 Hypertensive heart disease without heart failure: Secondary | ICD-10-CM | POA: Diagnosis not present

## 2023-04-16 DIAGNOSIS — E119 Type 2 diabetes mellitus without complications: Secondary | ICD-10-CM | POA: Diagnosis not present

## 2023-04-16 DIAGNOSIS — Z66 Do not resuscitate: Secondary | ICD-10-CM | POA: Diagnosis not present

## 2023-04-16 DIAGNOSIS — I119 Hypertensive heart disease without heart failure: Secondary | ICD-10-CM | POA: Diagnosis not present

## 2023-04-16 DIAGNOSIS — Z950 Presence of cardiac pacemaker: Secondary | ICD-10-CM | POA: Diagnosis not present

## 2023-04-16 DIAGNOSIS — J449 Chronic obstructive pulmonary disease, unspecified: Secondary | ICD-10-CM | POA: Diagnosis not present

## 2023-04-16 DIAGNOSIS — I2510 Atherosclerotic heart disease of native coronary artery without angina pectoris: Secondary | ICD-10-CM | POA: Diagnosis not present

## 2023-04-18 DIAGNOSIS — J449 Chronic obstructive pulmonary disease, unspecified: Secondary | ICD-10-CM | POA: Diagnosis not present

## 2023-04-18 DIAGNOSIS — E119 Type 2 diabetes mellitus without complications: Secondary | ICD-10-CM | POA: Diagnosis not present

## 2023-04-18 DIAGNOSIS — I119 Hypertensive heart disease without heart failure: Secondary | ICD-10-CM | POA: Diagnosis not present

## 2023-04-18 DIAGNOSIS — Z66 Do not resuscitate: Secondary | ICD-10-CM | POA: Diagnosis not present

## 2023-04-18 DIAGNOSIS — Z950 Presence of cardiac pacemaker: Secondary | ICD-10-CM | POA: Diagnosis not present

## 2023-04-18 DIAGNOSIS — I2510 Atherosclerotic heart disease of native coronary artery without angina pectoris: Secondary | ICD-10-CM | POA: Diagnosis not present

## 2023-04-22 DIAGNOSIS — Z950 Presence of cardiac pacemaker: Secondary | ICD-10-CM | POA: Diagnosis not present

## 2023-04-22 DIAGNOSIS — J449 Chronic obstructive pulmonary disease, unspecified: Secondary | ICD-10-CM | POA: Diagnosis not present

## 2023-04-22 DIAGNOSIS — I119 Hypertensive heart disease without heart failure: Secondary | ICD-10-CM | POA: Diagnosis not present

## 2023-04-22 DIAGNOSIS — E119 Type 2 diabetes mellitus without complications: Secondary | ICD-10-CM | POA: Diagnosis not present

## 2023-04-22 DIAGNOSIS — I2510 Atherosclerotic heart disease of native coronary artery without angina pectoris: Secondary | ICD-10-CM | POA: Diagnosis not present

## 2023-04-22 DIAGNOSIS — Z66 Do not resuscitate: Secondary | ICD-10-CM | POA: Diagnosis not present

## 2023-04-23 DIAGNOSIS — E119 Type 2 diabetes mellitus without complications: Secondary | ICD-10-CM | POA: Diagnosis not present

## 2023-04-23 DIAGNOSIS — J449 Chronic obstructive pulmonary disease, unspecified: Secondary | ICD-10-CM | POA: Diagnosis not present

## 2023-04-23 DIAGNOSIS — I119 Hypertensive heart disease without heart failure: Secondary | ICD-10-CM | POA: Diagnosis not present

## 2023-04-23 DIAGNOSIS — Z950 Presence of cardiac pacemaker: Secondary | ICD-10-CM | POA: Diagnosis not present

## 2023-04-23 DIAGNOSIS — Z66 Do not resuscitate: Secondary | ICD-10-CM | POA: Diagnosis not present

## 2023-04-23 DIAGNOSIS — I2510 Atherosclerotic heart disease of native coronary artery without angina pectoris: Secondary | ICD-10-CM | POA: Diagnosis not present

## 2023-04-25 DIAGNOSIS — I2510 Atherosclerotic heart disease of native coronary artery without angina pectoris: Secondary | ICD-10-CM | POA: Diagnosis not present

## 2023-04-25 DIAGNOSIS — I119 Hypertensive heart disease without heart failure: Secondary | ICD-10-CM | POA: Diagnosis not present

## 2023-04-25 DIAGNOSIS — E119 Type 2 diabetes mellitus without complications: Secondary | ICD-10-CM | POA: Diagnosis not present

## 2023-04-25 DIAGNOSIS — Z66 Do not resuscitate: Secondary | ICD-10-CM | POA: Diagnosis not present

## 2023-04-25 DIAGNOSIS — J449 Chronic obstructive pulmonary disease, unspecified: Secondary | ICD-10-CM | POA: Diagnosis not present

## 2023-04-25 DIAGNOSIS — Z950 Presence of cardiac pacemaker: Secondary | ICD-10-CM | POA: Diagnosis not present

## 2023-04-28 DIAGNOSIS — E119 Type 2 diabetes mellitus without complications: Secondary | ICD-10-CM | POA: Diagnosis not present

## 2023-04-28 DIAGNOSIS — I2510 Atherosclerotic heart disease of native coronary artery without angina pectoris: Secondary | ICD-10-CM | POA: Diagnosis not present

## 2023-04-28 DIAGNOSIS — Z66 Do not resuscitate: Secondary | ICD-10-CM | POA: Diagnosis not present

## 2023-04-28 DIAGNOSIS — J449 Chronic obstructive pulmonary disease, unspecified: Secondary | ICD-10-CM | POA: Diagnosis not present

## 2023-04-28 DIAGNOSIS — Z950 Presence of cardiac pacemaker: Secondary | ICD-10-CM | POA: Diagnosis not present

## 2023-04-28 DIAGNOSIS — I119 Hypertensive heart disease without heart failure: Secondary | ICD-10-CM | POA: Diagnosis not present

## 2023-04-30 DIAGNOSIS — I119 Hypertensive heart disease without heart failure: Secondary | ICD-10-CM | POA: Diagnosis not present

## 2023-04-30 DIAGNOSIS — E119 Type 2 diabetes mellitus without complications: Secondary | ICD-10-CM | POA: Diagnosis not present

## 2023-04-30 DIAGNOSIS — I2510 Atherosclerotic heart disease of native coronary artery without angina pectoris: Secondary | ICD-10-CM | POA: Diagnosis not present

## 2023-04-30 DIAGNOSIS — Z950 Presence of cardiac pacemaker: Secondary | ICD-10-CM | POA: Diagnosis not present

## 2023-04-30 DIAGNOSIS — Z66 Do not resuscitate: Secondary | ICD-10-CM | POA: Diagnosis not present

## 2023-04-30 DIAGNOSIS — J449 Chronic obstructive pulmonary disease, unspecified: Secondary | ICD-10-CM | POA: Diagnosis not present

## 2023-05-06 DIAGNOSIS — E119 Type 2 diabetes mellitus without complications: Secondary | ICD-10-CM | POA: Diagnosis not present

## 2023-05-06 DIAGNOSIS — I2510 Atherosclerotic heart disease of native coronary artery without angina pectoris: Secondary | ICD-10-CM | POA: Diagnosis not present

## 2023-05-06 DIAGNOSIS — Z950 Presence of cardiac pacemaker: Secondary | ICD-10-CM | POA: Diagnosis not present

## 2023-05-06 DIAGNOSIS — I119 Hypertensive heart disease without heart failure: Secondary | ICD-10-CM | POA: Diagnosis not present

## 2023-05-06 DIAGNOSIS — J449 Chronic obstructive pulmonary disease, unspecified: Secondary | ICD-10-CM | POA: Diagnosis not present

## 2023-05-06 DIAGNOSIS — Z66 Do not resuscitate: Secondary | ICD-10-CM | POA: Diagnosis not present

## 2023-05-07 DIAGNOSIS — E119 Type 2 diabetes mellitus without complications: Secondary | ICD-10-CM | POA: Diagnosis not present

## 2023-05-07 DIAGNOSIS — J449 Chronic obstructive pulmonary disease, unspecified: Secondary | ICD-10-CM | POA: Diagnosis not present

## 2023-05-07 DIAGNOSIS — Z950 Presence of cardiac pacemaker: Secondary | ICD-10-CM | POA: Diagnosis not present

## 2023-05-07 DIAGNOSIS — I2510 Atherosclerotic heart disease of native coronary artery without angina pectoris: Secondary | ICD-10-CM | POA: Diagnosis not present

## 2023-05-07 DIAGNOSIS — I119 Hypertensive heart disease without heart failure: Secondary | ICD-10-CM | POA: Diagnosis not present

## 2023-05-07 DIAGNOSIS — Z66 Do not resuscitate: Secondary | ICD-10-CM | POA: Diagnosis not present

## 2023-05-12 DIAGNOSIS — I119 Hypertensive heart disease without heart failure: Secondary | ICD-10-CM | POA: Diagnosis not present

## 2023-05-12 DIAGNOSIS — Z66 Do not resuscitate: Secondary | ICD-10-CM | POA: Diagnosis not present

## 2023-05-12 DIAGNOSIS — I2510 Atherosclerotic heart disease of native coronary artery without angina pectoris: Secondary | ICD-10-CM | POA: Diagnosis not present

## 2023-05-12 DIAGNOSIS — J449 Chronic obstructive pulmonary disease, unspecified: Secondary | ICD-10-CM | POA: Diagnosis not present

## 2023-05-12 DIAGNOSIS — E119 Type 2 diabetes mellitus without complications: Secondary | ICD-10-CM | POA: Diagnosis not present

## 2023-05-12 DIAGNOSIS — Z950 Presence of cardiac pacemaker: Secondary | ICD-10-CM | POA: Diagnosis not present

## 2023-05-13 DIAGNOSIS — Z950 Presence of cardiac pacemaker: Secondary | ICD-10-CM | POA: Diagnosis not present

## 2023-05-13 DIAGNOSIS — J449 Chronic obstructive pulmonary disease, unspecified: Secondary | ICD-10-CM | POA: Diagnosis not present

## 2023-05-13 DIAGNOSIS — Z66 Do not resuscitate: Secondary | ICD-10-CM | POA: Diagnosis not present

## 2023-05-13 DIAGNOSIS — I2510 Atherosclerotic heart disease of native coronary artery without angina pectoris: Secondary | ICD-10-CM | POA: Diagnosis not present

## 2023-05-13 DIAGNOSIS — I119 Hypertensive heart disease without heart failure: Secondary | ICD-10-CM | POA: Diagnosis not present

## 2023-05-13 DIAGNOSIS — E119 Type 2 diabetes mellitus without complications: Secondary | ICD-10-CM | POA: Diagnosis not present

## 2023-05-16 DIAGNOSIS — I119 Hypertensive heart disease without heart failure: Secondary | ICD-10-CM | POA: Diagnosis not present

## 2023-05-16 DIAGNOSIS — E119 Type 2 diabetes mellitus without complications: Secondary | ICD-10-CM | POA: Diagnosis not present

## 2023-05-16 DIAGNOSIS — Z950 Presence of cardiac pacemaker: Secondary | ICD-10-CM | POA: Diagnosis not present

## 2023-05-16 DIAGNOSIS — Z66 Do not resuscitate: Secondary | ICD-10-CM | POA: Diagnosis not present

## 2023-05-16 DIAGNOSIS — J449 Chronic obstructive pulmonary disease, unspecified: Secondary | ICD-10-CM | POA: Diagnosis not present

## 2023-05-16 DIAGNOSIS — I2510 Atherosclerotic heart disease of native coronary artery without angina pectoris: Secondary | ICD-10-CM | POA: Diagnosis not present

## 2023-05-20 DIAGNOSIS — I119 Hypertensive heart disease without heart failure: Secondary | ICD-10-CM | POA: Diagnosis not present

## 2023-05-20 DIAGNOSIS — E119 Type 2 diabetes mellitus without complications: Secondary | ICD-10-CM | POA: Diagnosis not present

## 2023-05-20 DIAGNOSIS — Z66 Do not resuscitate: Secondary | ICD-10-CM | POA: Diagnosis not present

## 2023-05-20 DIAGNOSIS — I2510 Atherosclerotic heart disease of native coronary artery without angina pectoris: Secondary | ICD-10-CM | POA: Diagnosis not present

## 2023-05-20 DIAGNOSIS — Z950 Presence of cardiac pacemaker: Secondary | ICD-10-CM | POA: Diagnosis not present

## 2023-05-20 DIAGNOSIS — J449 Chronic obstructive pulmonary disease, unspecified: Secondary | ICD-10-CM | POA: Diagnosis not present

## 2023-05-22 DIAGNOSIS — J449 Chronic obstructive pulmonary disease, unspecified: Secondary | ICD-10-CM | POA: Diagnosis not present

## 2023-05-22 DIAGNOSIS — I119 Hypertensive heart disease without heart failure: Secondary | ICD-10-CM | POA: Diagnosis not present

## 2023-05-22 DIAGNOSIS — I2510 Atherosclerotic heart disease of native coronary artery without angina pectoris: Secondary | ICD-10-CM | POA: Diagnosis not present

## 2023-05-22 DIAGNOSIS — Z950 Presence of cardiac pacemaker: Secondary | ICD-10-CM | POA: Diagnosis not present

## 2023-05-22 DIAGNOSIS — E119 Type 2 diabetes mellitus without complications: Secondary | ICD-10-CM | POA: Diagnosis not present

## 2023-05-22 DIAGNOSIS — Z66 Do not resuscitate: Secondary | ICD-10-CM | POA: Diagnosis not present

## 2023-05-23 ENCOUNTER — Ambulatory Visit (INDEPENDENT_AMBULATORY_CARE_PROVIDER_SITE_OTHER): Payer: Medicare Other

## 2023-05-23 DIAGNOSIS — I679 Cerebrovascular disease, unspecified: Secondary | ICD-10-CM | POA: Diagnosis not present

## 2023-05-23 DIAGNOSIS — J449 Chronic obstructive pulmonary disease, unspecified: Secondary | ICD-10-CM | POA: Diagnosis not present

## 2023-05-23 DIAGNOSIS — I119 Hypertensive heart disease without heart failure: Secondary | ICD-10-CM | POA: Diagnosis not present

## 2023-05-23 DIAGNOSIS — Z950 Presence of cardiac pacemaker: Secondary | ICD-10-CM | POA: Diagnosis not present

## 2023-05-23 DIAGNOSIS — Z66 Do not resuscitate: Secondary | ICD-10-CM | POA: Diagnosis not present

## 2023-05-23 DIAGNOSIS — E119 Type 2 diabetes mellitus without complications: Secondary | ICD-10-CM | POA: Diagnosis not present

## 2023-05-23 DIAGNOSIS — I2510 Atherosclerotic heart disease of native coronary artery without angina pectoris: Secondary | ICD-10-CM | POA: Diagnosis not present

## 2023-05-23 LAB — CUP PACEART REMOTE DEVICE CHECK
Battery Remaining Longevity: 95 mo
Battery Remaining Percentage: 95 %
Battery Voltage: 3.02 V
Brady Statistic AP VP Percent: 1 %
Brady Statistic AP VS Percent: 6.1 %
Brady Statistic AS VP Percent: 1 %
Brady Statistic AS VS Percent: 93 %
Brady Statistic RA Percent Paced: 4.5 %
Brady Statistic RV Percent Paced: 1 %
Date Time Interrogation Session: 20240726020014
Implantable Lead Connection Status: 753985
Implantable Lead Connection Status: 753985
Implantable Lead Implant Date: 20230706
Implantable Lead Implant Date: 20230706
Implantable Lead Location: 753859
Implantable Lead Location: 753860
Implantable Pulse Generator Implant Date: 20230706
Lead Channel Impedance Value: 510 Ohm
Lead Channel Impedance Value: 590 Ohm
Lead Channel Pacing Threshold Amplitude: 0.5 V
Lead Channel Pacing Threshold Amplitude: 0.5 V
Lead Channel Pacing Threshold Pulse Width: 0.3 ms
Lead Channel Pacing Threshold Pulse Width: 0.5 ms
Lead Channel Sensing Intrinsic Amplitude: 12 mV
Lead Channel Sensing Intrinsic Amplitude: 5 mV
Lead Channel Setting Pacing Amplitude: 3.5 V
Lead Channel Setting Pacing Amplitude: 3.5 V
Lead Channel Setting Pacing Pulse Width: 0.5 ms
Lead Channel Setting Sensing Sensitivity: 2 mV
Pulse Gen Model: 2272
Pulse Gen Serial Number: 8098905

## 2023-05-27 DIAGNOSIS — E119 Type 2 diabetes mellitus without complications: Secondary | ICD-10-CM | POA: Diagnosis not present

## 2023-05-27 DIAGNOSIS — J449 Chronic obstructive pulmonary disease, unspecified: Secondary | ICD-10-CM | POA: Diagnosis not present

## 2023-05-27 DIAGNOSIS — I2510 Atherosclerotic heart disease of native coronary artery without angina pectoris: Secondary | ICD-10-CM | POA: Diagnosis not present

## 2023-05-27 DIAGNOSIS — Z66 Do not resuscitate: Secondary | ICD-10-CM | POA: Diagnosis not present

## 2023-05-27 DIAGNOSIS — I119 Hypertensive heart disease without heart failure: Secondary | ICD-10-CM | POA: Diagnosis not present

## 2023-05-27 DIAGNOSIS — Z950 Presence of cardiac pacemaker: Secondary | ICD-10-CM | POA: Diagnosis not present

## 2023-05-29 DIAGNOSIS — I119 Hypertensive heart disease without heart failure: Secondary | ICD-10-CM | POA: Diagnosis not present

## 2023-05-29 DIAGNOSIS — I2510 Atherosclerotic heart disease of native coronary artery without angina pectoris: Secondary | ICD-10-CM | POA: Diagnosis not present

## 2023-05-29 DIAGNOSIS — Z950 Presence of cardiac pacemaker: Secondary | ICD-10-CM | POA: Diagnosis not present

## 2023-05-29 DIAGNOSIS — Z66 Do not resuscitate: Secondary | ICD-10-CM | POA: Diagnosis not present

## 2023-05-29 DIAGNOSIS — J449 Chronic obstructive pulmonary disease, unspecified: Secondary | ICD-10-CM | POA: Diagnosis not present

## 2023-05-29 DIAGNOSIS — E119 Type 2 diabetes mellitus without complications: Secondary | ICD-10-CM | POA: Diagnosis not present

## 2023-05-30 DIAGNOSIS — I2510 Atherosclerotic heart disease of native coronary artery without angina pectoris: Secondary | ICD-10-CM | POA: Diagnosis not present

## 2023-05-30 DIAGNOSIS — J449 Chronic obstructive pulmonary disease, unspecified: Secondary | ICD-10-CM | POA: Diagnosis not present

## 2023-05-30 DIAGNOSIS — E119 Type 2 diabetes mellitus without complications: Secondary | ICD-10-CM | POA: Diagnosis not present

## 2023-05-30 DIAGNOSIS — Z66 Do not resuscitate: Secondary | ICD-10-CM | POA: Diagnosis not present

## 2023-05-30 DIAGNOSIS — Z950 Presence of cardiac pacemaker: Secondary | ICD-10-CM | POA: Diagnosis not present

## 2023-05-30 DIAGNOSIS — I119 Hypertensive heart disease without heart failure: Secondary | ICD-10-CM | POA: Diagnosis not present

## 2023-05-30 NOTE — Progress Notes (Signed)
Remote pacemaker transmission.   

## 2023-06-03 DIAGNOSIS — Z950 Presence of cardiac pacemaker: Secondary | ICD-10-CM | POA: Diagnosis not present

## 2023-06-03 DIAGNOSIS — I119 Hypertensive heart disease without heart failure: Secondary | ICD-10-CM | POA: Diagnosis not present

## 2023-06-03 DIAGNOSIS — J449 Chronic obstructive pulmonary disease, unspecified: Secondary | ICD-10-CM | POA: Diagnosis not present

## 2023-06-03 DIAGNOSIS — E119 Type 2 diabetes mellitus without complications: Secondary | ICD-10-CM | POA: Diagnosis not present

## 2023-06-03 DIAGNOSIS — I2510 Atherosclerotic heart disease of native coronary artery without angina pectoris: Secondary | ICD-10-CM | POA: Diagnosis not present

## 2023-06-03 DIAGNOSIS — Z66 Do not resuscitate: Secondary | ICD-10-CM | POA: Diagnosis not present

## 2023-06-06 DIAGNOSIS — Z66 Do not resuscitate: Secondary | ICD-10-CM | POA: Diagnosis not present

## 2023-06-06 DIAGNOSIS — J449 Chronic obstructive pulmonary disease, unspecified: Secondary | ICD-10-CM | POA: Diagnosis not present

## 2023-06-06 DIAGNOSIS — I2510 Atherosclerotic heart disease of native coronary artery without angina pectoris: Secondary | ICD-10-CM | POA: Diagnosis not present

## 2023-06-06 DIAGNOSIS — E119 Type 2 diabetes mellitus without complications: Secondary | ICD-10-CM | POA: Diagnosis not present

## 2023-06-06 DIAGNOSIS — Z950 Presence of cardiac pacemaker: Secondary | ICD-10-CM | POA: Diagnosis not present

## 2023-06-06 DIAGNOSIS — I119 Hypertensive heart disease without heart failure: Secondary | ICD-10-CM | POA: Diagnosis not present

## 2023-06-09 DIAGNOSIS — Z950 Presence of cardiac pacemaker: Secondary | ICD-10-CM | POA: Diagnosis not present

## 2023-06-09 DIAGNOSIS — Z66 Do not resuscitate: Secondary | ICD-10-CM | POA: Diagnosis not present

## 2023-06-09 DIAGNOSIS — E119 Type 2 diabetes mellitus without complications: Secondary | ICD-10-CM | POA: Diagnosis not present

## 2023-06-09 DIAGNOSIS — I2510 Atherosclerotic heart disease of native coronary artery without angina pectoris: Secondary | ICD-10-CM | POA: Diagnosis not present

## 2023-06-09 DIAGNOSIS — I119 Hypertensive heart disease without heart failure: Secondary | ICD-10-CM | POA: Diagnosis not present

## 2023-06-09 DIAGNOSIS — J449 Chronic obstructive pulmonary disease, unspecified: Secondary | ICD-10-CM | POA: Diagnosis not present

## 2023-06-10 DIAGNOSIS — E119 Type 2 diabetes mellitus without complications: Secondary | ICD-10-CM | POA: Diagnosis not present

## 2023-06-10 DIAGNOSIS — I119 Hypertensive heart disease without heart failure: Secondary | ICD-10-CM | POA: Diagnosis not present

## 2023-06-10 DIAGNOSIS — Z950 Presence of cardiac pacemaker: Secondary | ICD-10-CM | POA: Diagnosis not present

## 2023-06-10 DIAGNOSIS — I2510 Atherosclerotic heart disease of native coronary artery without angina pectoris: Secondary | ICD-10-CM | POA: Diagnosis not present

## 2023-06-10 DIAGNOSIS — J449 Chronic obstructive pulmonary disease, unspecified: Secondary | ICD-10-CM | POA: Diagnosis not present

## 2023-06-10 DIAGNOSIS — Z66 Do not resuscitate: Secondary | ICD-10-CM | POA: Diagnosis not present

## 2023-06-13 DIAGNOSIS — Z950 Presence of cardiac pacemaker: Secondary | ICD-10-CM | POA: Diagnosis not present

## 2023-06-13 DIAGNOSIS — E119 Type 2 diabetes mellitus without complications: Secondary | ICD-10-CM | POA: Diagnosis not present

## 2023-06-13 DIAGNOSIS — J449 Chronic obstructive pulmonary disease, unspecified: Secondary | ICD-10-CM | POA: Diagnosis not present

## 2023-06-13 DIAGNOSIS — I2510 Atherosclerotic heart disease of native coronary artery without angina pectoris: Secondary | ICD-10-CM | POA: Diagnosis not present

## 2023-06-13 DIAGNOSIS — I119 Hypertensive heart disease without heart failure: Secondary | ICD-10-CM | POA: Diagnosis not present

## 2023-06-13 DIAGNOSIS — Z66 Do not resuscitate: Secondary | ICD-10-CM | POA: Diagnosis not present

## 2023-06-16 DIAGNOSIS — Z950 Presence of cardiac pacemaker: Secondary | ICD-10-CM | POA: Diagnosis not present

## 2023-06-16 DIAGNOSIS — I119 Hypertensive heart disease without heart failure: Secondary | ICD-10-CM | POA: Diagnosis not present

## 2023-06-16 DIAGNOSIS — J449 Chronic obstructive pulmonary disease, unspecified: Secondary | ICD-10-CM | POA: Diagnosis not present

## 2023-06-16 DIAGNOSIS — I2510 Atherosclerotic heart disease of native coronary artery without angina pectoris: Secondary | ICD-10-CM | POA: Diagnosis not present

## 2023-06-16 DIAGNOSIS — E119 Type 2 diabetes mellitus without complications: Secondary | ICD-10-CM | POA: Diagnosis not present

## 2023-06-16 DIAGNOSIS — Z66 Do not resuscitate: Secondary | ICD-10-CM | POA: Diagnosis not present

## 2023-06-17 DIAGNOSIS — J449 Chronic obstructive pulmonary disease, unspecified: Secondary | ICD-10-CM | POA: Diagnosis not present

## 2023-06-17 DIAGNOSIS — I119 Hypertensive heart disease without heart failure: Secondary | ICD-10-CM | POA: Diagnosis not present

## 2023-06-17 DIAGNOSIS — Z950 Presence of cardiac pacemaker: Secondary | ICD-10-CM | POA: Diagnosis not present

## 2023-06-17 DIAGNOSIS — I2510 Atherosclerotic heart disease of native coronary artery without angina pectoris: Secondary | ICD-10-CM | POA: Diagnosis not present

## 2023-06-17 DIAGNOSIS — E119 Type 2 diabetes mellitus without complications: Secondary | ICD-10-CM | POA: Diagnosis not present

## 2023-06-17 DIAGNOSIS — Z66 Do not resuscitate: Secondary | ICD-10-CM | POA: Diagnosis not present

## 2023-06-20 DIAGNOSIS — E119 Type 2 diabetes mellitus without complications: Secondary | ICD-10-CM | POA: Diagnosis not present

## 2023-06-20 DIAGNOSIS — Z66 Do not resuscitate: Secondary | ICD-10-CM | POA: Diagnosis not present

## 2023-06-20 DIAGNOSIS — I119 Hypertensive heart disease without heart failure: Secondary | ICD-10-CM | POA: Diagnosis not present

## 2023-06-20 DIAGNOSIS — Z950 Presence of cardiac pacemaker: Secondary | ICD-10-CM | POA: Diagnosis not present

## 2023-06-20 DIAGNOSIS — J449 Chronic obstructive pulmonary disease, unspecified: Secondary | ICD-10-CM | POA: Diagnosis not present

## 2023-06-20 DIAGNOSIS — I2510 Atherosclerotic heart disease of native coronary artery without angina pectoris: Secondary | ICD-10-CM | POA: Diagnosis not present

## 2023-06-22 DIAGNOSIS — E119 Type 2 diabetes mellitus without complications: Secondary | ICD-10-CM | POA: Diagnosis not present

## 2023-06-22 DIAGNOSIS — J449 Chronic obstructive pulmonary disease, unspecified: Secondary | ICD-10-CM | POA: Diagnosis not present

## 2023-06-22 DIAGNOSIS — I119 Hypertensive heart disease without heart failure: Secondary | ICD-10-CM | POA: Diagnosis not present

## 2023-06-22 DIAGNOSIS — Z66 Do not resuscitate: Secondary | ICD-10-CM | POA: Diagnosis not present

## 2023-06-22 DIAGNOSIS — I2510 Atherosclerotic heart disease of native coronary artery without angina pectoris: Secondary | ICD-10-CM | POA: Diagnosis not present

## 2023-06-22 DIAGNOSIS — Z950 Presence of cardiac pacemaker: Secondary | ICD-10-CM | POA: Diagnosis not present

## 2023-06-24 DIAGNOSIS — J449 Chronic obstructive pulmonary disease, unspecified: Secondary | ICD-10-CM | POA: Diagnosis not present

## 2023-06-24 DIAGNOSIS — I119 Hypertensive heart disease without heart failure: Secondary | ICD-10-CM | POA: Diagnosis not present

## 2023-06-24 DIAGNOSIS — E119 Type 2 diabetes mellitus without complications: Secondary | ICD-10-CM | POA: Diagnosis not present

## 2023-06-24 DIAGNOSIS — Z950 Presence of cardiac pacemaker: Secondary | ICD-10-CM | POA: Diagnosis not present

## 2023-06-24 DIAGNOSIS — I2510 Atherosclerotic heart disease of native coronary artery without angina pectoris: Secondary | ICD-10-CM | POA: Diagnosis not present

## 2023-06-24 DIAGNOSIS — Z66 Do not resuscitate: Secondary | ICD-10-CM | POA: Diagnosis not present

## 2023-06-27 DIAGNOSIS — I119 Hypertensive heart disease without heart failure: Secondary | ICD-10-CM | POA: Diagnosis not present

## 2023-06-27 DIAGNOSIS — I2510 Atherosclerotic heart disease of native coronary artery without angina pectoris: Secondary | ICD-10-CM | POA: Diagnosis not present

## 2023-06-27 DIAGNOSIS — Z950 Presence of cardiac pacemaker: Secondary | ICD-10-CM | POA: Diagnosis not present

## 2023-06-27 DIAGNOSIS — J449 Chronic obstructive pulmonary disease, unspecified: Secondary | ICD-10-CM | POA: Diagnosis not present

## 2023-06-27 DIAGNOSIS — Z66 Do not resuscitate: Secondary | ICD-10-CM | POA: Diagnosis not present

## 2023-06-27 DIAGNOSIS — E119 Type 2 diabetes mellitus without complications: Secondary | ICD-10-CM | POA: Diagnosis not present

## 2023-06-29 DIAGNOSIS — I119 Hypertensive heart disease without heart failure: Secondary | ICD-10-CM | POA: Diagnosis not present

## 2023-06-29 DIAGNOSIS — Z66 Do not resuscitate: Secondary | ICD-10-CM | POA: Diagnosis not present

## 2023-06-29 DIAGNOSIS — Z950 Presence of cardiac pacemaker: Secondary | ICD-10-CM | POA: Diagnosis not present

## 2023-06-29 DIAGNOSIS — E119 Type 2 diabetes mellitus without complications: Secondary | ICD-10-CM | POA: Diagnosis not present

## 2023-06-29 DIAGNOSIS — J449 Chronic obstructive pulmonary disease, unspecified: Secondary | ICD-10-CM | POA: Diagnosis not present

## 2023-06-29 DIAGNOSIS — I2510 Atherosclerotic heart disease of native coronary artery without angina pectoris: Secondary | ICD-10-CM | POA: Diagnosis not present

## 2023-07-01 DIAGNOSIS — I119 Hypertensive heart disease without heart failure: Secondary | ICD-10-CM | POA: Diagnosis not present

## 2023-07-01 DIAGNOSIS — E119 Type 2 diabetes mellitus without complications: Secondary | ICD-10-CM | POA: Diagnosis not present

## 2023-07-01 DIAGNOSIS — J449 Chronic obstructive pulmonary disease, unspecified: Secondary | ICD-10-CM | POA: Diagnosis not present

## 2023-07-01 DIAGNOSIS — Z66 Do not resuscitate: Secondary | ICD-10-CM | POA: Diagnosis not present

## 2023-07-01 DIAGNOSIS — I2510 Atherosclerotic heart disease of native coronary artery without angina pectoris: Secondary | ICD-10-CM | POA: Diagnosis not present

## 2023-07-01 DIAGNOSIS — Z950 Presence of cardiac pacemaker: Secondary | ICD-10-CM | POA: Diagnosis not present

## 2023-07-05 DIAGNOSIS — I2510 Atherosclerotic heart disease of native coronary artery without angina pectoris: Secondary | ICD-10-CM | POA: Diagnosis not present

## 2023-07-05 DIAGNOSIS — Z66 Do not resuscitate: Secondary | ICD-10-CM | POA: Diagnosis not present

## 2023-07-05 DIAGNOSIS — J449 Chronic obstructive pulmonary disease, unspecified: Secondary | ICD-10-CM | POA: Diagnosis not present

## 2023-07-05 DIAGNOSIS — Z950 Presence of cardiac pacemaker: Secondary | ICD-10-CM | POA: Diagnosis not present

## 2023-07-05 DIAGNOSIS — E119 Type 2 diabetes mellitus without complications: Secondary | ICD-10-CM | POA: Diagnosis not present

## 2023-07-05 DIAGNOSIS — I119 Hypertensive heart disease without heart failure: Secondary | ICD-10-CM | POA: Diagnosis not present

## 2023-07-08 DIAGNOSIS — J449 Chronic obstructive pulmonary disease, unspecified: Secondary | ICD-10-CM | POA: Diagnosis not present

## 2023-07-08 DIAGNOSIS — I119 Hypertensive heart disease without heart failure: Secondary | ICD-10-CM | POA: Diagnosis not present

## 2023-07-08 DIAGNOSIS — Z66 Do not resuscitate: Secondary | ICD-10-CM | POA: Diagnosis not present

## 2023-07-08 DIAGNOSIS — I2510 Atherosclerotic heart disease of native coronary artery without angina pectoris: Secondary | ICD-10-CM | POA: Diagnosis not present

## 2023-07-08 DIAGNOSIS — E119 Type 2 diabetes mellitus without complications: Secondary | ICD-10-CM | POA: Diagnosis not present

## 2023-07-08 DIAGNOSIS — Z950 Presence of cardiac pacemaker: Secondary | ICD-10-CM | POA: Diagnosis not present

## 2023-07-09 DIAGNOSIS — I2510 Atherosclerotic heart disease of native coronary artery without angina pectoris: Secondary | ICD-10-CM | POA: Diagnosis not present

## 2023-07-09 DIAGNOSIS — Z950 Presence of cardiac pacemaker: Secondary | ICD-10-CM | POA: Diagnosis not present

## 2023-07-09 DIAGNOSIS — J449 Chronic obstructive pulmonary disease, unspecified: Secondary | ICD-10-CM | POA: Diagnosis not present

## 2023-07-09 DIAGNOSIS — I119 Hypertensive heart disease without heart failure: Secondary | ICD-10-CM | POA: Diagnosis not present

## 2023-07-09 DIAGNOSIS — Z66 Do not resuscitate: Secondary | ICD-10-CM | POA: Diagnosis not present

## 2023-07-09 DIAGNOSIS — E119 Type 2 diabetes mellitus without complications: Secondary | ICD-10-CM | POA: Diagnosis not present

## 2023-07-11 DIAGNOSIS — J449 Chronic obstructive pulmonary disease, unspecified: Secondary | ICD-10-CM | POA: Diagnosis not present

## 2023-07-11 DIAGNOSIS — I119 Hypertensive heart disease without heart failure: Secondary | ICD-10-CM | POA: Diagnosis not present

## 2023-07-11 DIAGNOSIS — E119 Type 2 diabetes mellitus without complications: Secondary | ICD-10-CM | POA: Diagnosis not present

## 2023-07-11 DIAGNOSIS — Z66 Do not resuscitate: Secondary | ICD-10-CM | POA: Diagnosis not present

## 2023-07-11 DIAGNOSIS — Z950 Presence of cardiac pacemaker: Secondary | ICD-10-CM | POA: Diagnosis not present

## 2023-07-11 DIAGNOSIS — I2510 Atherosclerotic heart disease of native coronary artery without angina pectoris: Secondary | ICD-10-CM | POA: Diagnosis not present

## 2023-07-15 DIAGNOSIS — I2510 Atherosclerotic heart disease of native coronary artery without angina pectoris: Secondary | ICD-10-CM | POA: Diagnosis not present

## 2023-07-15 DIAGNOSIS — I119 Hypertensive heart disease without heart failure: Secondary | ICD-10-CM | POA: Diagnosis not present

## 2023-07-15 DIAGNOSIS — Z66 Do not resuscitate: Secondary | ICD-10-CM | POA: Diagnosis not present

## 2023-07-15 DIAGNOSIS — Z950 Presence of cardiac pacemaker: Secondary | ICD-10-CM | POA: Diagnosis not present

## 2023-07-15 DIAGNOSIS — E119 Type 2 diabetes mellitus without complications: Secondary | ICD-10-CM | POA: Diagnosis not present

## 2023-07-15 DIAGNOSIS — J449 Chronic obstructive pulmonary disease, unspecified: Secondary | ICD-10-CM | POA: Diagnosis not present

## 2023-07-18 DIAGNOSIS — I2510 Atherosclerotic heart disease of native coronary artery without angina pectoris: Secondary | ICD-10-CM | POA: Diagnosis not present

## 2023-07-18 DIAGNOSIS — J449 Chronic obstructive pulmonary disease, unspecified: Secondary | ICD-10-CM | POA: Diagnosis not present

## 2023-07-18 DIAGNOSIS — I119 Hypertensive heart disease without heart failure: Secondary | ICD-10-CM | POA: Diagnosis not present

## 2023-07-18 DIAGNOSIS — Z66 Do not resuscitate: Secondary | ICD-10-CM | POA: Diagnosis not present

## 2023-07-18 DIAGNOSIS — E119 Type 2 diabetes mellitus without complications: Secondary | ICD-10-CM | POA: Diagnosis not present

## 2023-07-18 DIAGNOSIS — Z950 Presence of cardiac pacemaker: Secondary | ICD-10-CM | POA: Diagnosis not present

## 2023-07-19 DIAGNOSIS — Z66 Do not resuscitate: Secondary | ICD-10-CM | POA: Diagnosis not present

## 2023-07-19 DIAGNOSIS — I119 Hypertensive heart disease without heart failure: Secondary | ICD-10-CM | POA: Diagnosis not present

## 2023-07-19 DIAGNOSIS — J449 Chronic obstructive pulmonary disease, unspecified: Secondary | ICD-10-CM | POA: Diagnosis not present

## 2023-07-19 DIAGNOSIS — Z950 Presence of cardiac pacemaker: Secondary | ICD-10-CM | POA: Diagnosis not present

## 2023-07-19 DIAGNOSIS — I2510 Atherosclerotic heart disease of native coronary artery without angina pectoris: Secondary | ICD-10-CM | POA: Diagnosis not present

## 2023-07-19 DIAGNOSIS — E119 Type 2 diabetes mellitus without complications: Secondary | ICD-10-CM | POA: Diagnosis not present

## 2023-07-22 DIAGNOSIS — E119 Type 2 diabetes mellitus without complications: Secondary | ICD-10-CM | POA: Diagnosis not present

## 2023-07-22 DIAGNOSIS — Z950 Presence of cardiac pacemaker: Secondary | ICD-10-CM | POA: Diagnosis not present

## 2023-07-22 DIAGNOSIS — J449 Chronic obstructive pulmonary disease, unspecified: Secondary | ICD-10-CM | POA: Diagnosis not present

## 2023-07-22 DIAGNOSIS — I2510 Atherosclerotic heart disease of native coronary artery without angina pectoris: Secondary | ICD-10-CM | POA: Diagnosis not present

## 2023-07-22 DIAGNOSIS — Z66 Do not resuscitate: Secondary | ICD-10-CM | POA: Diagnosis not present

## 2023-07-22 DIAGNOSIS — I119 Hypertensive heart disease without heart failure: Secondary | ICD-10-CM | POA: Diagnosis not present

## 2023-07-25 DIAGNOSIS — E119 Type 2 diabetes mellitus without complications: Secondary | ICD-10-CM | POA: Diagnosis not present

## 2023-07-25 DIAGNOSIS — Z950 Presence of cardiac pacemaker: Secondary | ICD-10-CM | POA: Diagnosis not present

## 2023-07-25 DIAGNOSIS — Z66 Do not resuscitate: Secondary | ICD-10-CM | POA: Diagnosis not present

## 2023-07-25 DIAGNOSIS — I2510 Atherosclerotic heart disease of native coronary artery without angina pectoris: Secondary | ICD-10-CM | POA: Diagnosis not present

## 2023-07-25 DIAGNOSIS — J449 Chronic obstructive pulmonary disease, unspecified: Secondary | ICD-10-CM | POA: Diagnosis not present

## 2023-07-25 DIAGNOSIS — I119 Hypertensive heart disease without heart failure: Secondary | ICD-10-CM | POA: Diagnosis not present

## 2023-07-29 DIAGNOSIS — Z66 Do not resuscitate: Secondary | ICD-10-CM | POA: Diagnosis not present

## 2023-07-29 DIAGNOSIS — Z950 Presence of cardiac pacemaker: Secondary | ICD-10-CM | POA: Diagnosis not present

## 2023-07-29 DIAGNOSIS — J449 Chronic obstructive pulmonary disease, unspecified: Secondary | ICD-10-CM | POA: Diagnosis not present

## 2023-07-29 DIAGNOSIS — E119 Type 2 diabetes mellitus without complications: Secondary | ICD-10-CM | POA: Diagnosis not present

## 2023-07-29 DIAGNOSIS — I2510 Atherosclerotic heart disease of native coronary artery without angina pectoris: Secondary | ICD-10-CM | POA: Diagnosis not present

## 2023-07-29 DIAGNOSIS — I119 Hypertensive heart disease without heart failure: Secondary | ICD-10-CM | POA: Diagnosis not present

## 2023-07-30 DIAGNOSIS — I119 Hypertensive heart disease without heart failure: Secondary | ICD-10-CM | POA: Diagnosis not present

## 2023-07-30 DIAGNOSIS — J449 Chronic obstructive pulmonary disease, unspecified: Secondary | ICD-10-CM | POA: Diagnosis not present

## 2023-07-30 DIAGNOSIS — Z66 Do not resuscitate: Secondary | ICD-10-CM | POA: Diagnosis not present

## 2023-07-30 DIAGNOSIS — E119 Type 2 diabetes mellitus without complications: Secondary | ICD-10-CM | POA: Diagnosis not present

## 2023-07-30 DIAGNOSIS — I2510 Atherosclerotic heart disease of native coronary artery without angina pectoris: Secondary | ICD-10-CM | POA: Diagnosis not present

## 2023-07-30 DIAGNOSIS — Z950 Presence of cardiac pacemaker: Secondary | ICD-10-CM | POA: Diagnosis not present

## 2023-08-01 DIAGNOSIS — I2510 Atherosclerotic heart disease of native coronary artery without angina pectoris: Secondary | ICD-10-CM | POA: Diagnosis not present

## 2023-08-01 DIAGNOSIS — Z950 Presence of cardiac pacemaker: Secondary | ICD-10-CM | POA: Diagnosis not present

## 2023-08-01 DIAGNOSIS — J449 Chronic obstructive pulmonary disease, unspecified: Secondary | ICD-10-CM | POA: Diagnosis not present

## 2023-08-01 DIAGNOSIS — E119 Type 2 diabetes mellitus without complications: Secondary | ICD-10-CM | POA: Diagnosis not present

## 2023-08-01 DIAGNOSIS — Z66 Do not resuscitate: Secondary | ICD-10-CM | POA: Diagnosis not present

## 2023-08-01 DIAGNOSIS — I119 Hypertensive heart disease without heart failure: Secondary | ICD-10-CM | POA: Diagnosis not present

## 2023-08-05 DIAGNOSIS — I119 Hypertensive heart disease without heart failure: Secondary | ICD-10-CM | POA: Diagnosis not present

## 2023-08-05 DIAGNOSIS — E119 Type 2 diabetes mellitus without complications: Secondary | ICD-10-CM | POA: Diagnosis not present

## 2023-08-05 DIAGNOSIS — Z950 Presence of cardiac pacemaker: Secondary | ICD-10-CM | POA: Diagnosis not present

## 2023-08-05 DIAGNOSIS — Z66 Do not resuscitate: Secondary | ICD-10-CM | POA: Diagnosis not present

## 2023-08-05 DIAGNOSIS — I2510 Atherosclerotic heart disease of native coronary artery without angina pectoris: Secondary | ICD-10-CM | POA: Diagnosis not present

## 2023-08-05 DIAGNOSIS — J449 Chronic obstructive pulmonary disease, unspecified: Secondary | ICD-10-CM | POA: Diagnosis not present

## 2023-08-07 DIAGNOSIS — Z66 Do not resuscitate: Secondary | ICD-10-CM | POA: Diagnosis not present

## 2023-08-07 DIAGNOSIS — I2510 Atherosclerotic heart disease of native coronary artery without angina pectoris: Secondary | ICD-10-CM | POA: Diagnosis not present

## 2023-08-07 DIAGNOSIS — J449 Chronic obstructive pulmonary disease, unspecified: Secondary | ICD-10-CM | POA: Diagnosis not present

## 2023-08-07 DIAGNOSIS — I119 Hypertensive heart disease without heart failure: Secondary | ICD-10-CM | POA: Diagnosis not present

## 2023-08-07 DIAGNOSIS — E119 Type 2 diabetes mellitus without complications: Secondary | ICD-10-CM | POA: Diagnosis not present

## 2023-08-07 DIAGNOSIS — Z950 Presence of cardiac pacemaker: Secondary | ICD-10-CM | POA: Diagnosis not present

## 2023-08-08 DIAGNOSIS — Z950 Presence of cardiac pacemaker: Secondary | ICD-10-CM | POA: Diagnosis not present

## 2023-08-08 DIAGNOSIS — J449 Chronic obstructive pulmonary disease, unspecified: Secondary | ICD-10-CM | POA: Diagnosis not present

## 2023-08-08 DIAGNOSIS — I2510 Atherosclerotic heart disease of native coronary artery without angina pectoris: Secondary | ICD-10-CM | POA: Diagnosis not present

## 2023-08-08 DIAGNOSIS — Z66 Do not resuscitate: Secondary | ICD-10-CM | POA: Diagnosis not present

## 2023-08-08 DIAGNOSIS — I119 Hypertensive heart disease without heart failure: Secondary | ICD-10-CM | POA: Diagnosis not present

## 2023-08-08 DIAGNOSIS — E119 Type 2 diabetes mellitus without complications: Secondary | ICD-10-CM | POA: Diagnosis not present

## 2023-08-11 DIAGNOSIS — Z950 Presence of cardiac pacemaker: Secondary | ICD-10-CM | POA: Diagnosis not present

## 2023-08-11 DIAGNOSIS — Z66 Do not resuscitate: Secondary | ICD-10-CM | POA: Diagnosis not present

## 2023-08-11 DIAGNOSIS — E119 Type 2 diabetes mellitus without complications: Secondary | ICD-10-CM | POA: Diagnosis not present

## 2023-08-11 DIAGNOSIS — I119 Hypertensive heart disease without heart failure: Secondary | ICD-10-CM | POA: Diagnosis not present

## 2023-08-11 DIAGNOSIS — J449 Chronic obstructive pulmonary disease, unspecified: Secondary | ICD-10-CM | POA: Diagnosis not present

## 2023-08-11 DIAGNOSIS — I2510 Atherosclerotic heart disease of native coronary artery without angina pectoris: Secondary | ICD-10-CM | POA: Diagnosis not present

## 2023-08-12 DIAGNOSIS — Z950 Presence of cardiac pacemaker: Secondary | ICD-10-CM | POA: Diagnosis not present

## 2023-08-12 DIAGNOSIS — I2510 Atherosclerotic heart disease of native coronary artery without angina pectoris: Secondary | ICD-10-CM | POA: Diagnosis not present

## 2023-08-12 DIAGNOSIS — E119 Type 2 diabetes mellitus without complications: Secondary | ICD-10-CM | POA: Diagnosis not present

## 2023-08-12 DIAGNOSIS — Z66 Do not resuscitate: Secondary | ICD-10-CM | POA: Diagnosis not present

## 2023-08-12 DIAGNOSIS — J449 Chronic obstructive pulmonary disease, unspecified: Secondary | ICD-10-CM | POA: Diagnosis not present

## 2023-08-12 DIAGNOSIS — I119 Hypertensive heart disease without heart failure: Secondary | ICD-10-CM | POA: Diagnosis not present

## 2023-08-15 DIAGNOSIS — Z66 Do not resuscitate: Secondary | ICD-10-CM | POA: Diagnosis not present

## 2023-08-15 DIAGNOSIS — I119 Hypertensive heart disease without heart failure: Secondary | ICD-10-CM | POA: Diagnosis not present

## 2023-08-15 DIAGNOSIS — E119 Type 2 diabetes mellitus without complications: Secondary | ICD-10-CM | POA: Diagnosis not present

## 2023-08-15 DIAGNOSIS — Z950 Presence of cardiac pacemaker: Secondary | ICD-10-CM | POA: Diagnosis not present

## 2023-08-15 DIAGNOSIS — I2510 Atherosclerotic heart disease of native coronary artery without angina pectoris: Secondary | ICD-10-CM | POA: Diagnosis not present

## 2023-08-15 DIAGNOSIS — J449 Chronic obstructive pulmonary disease, unspecified: Secondary | ICD-10-CM | POA: Diagnosis not present

## 2023-08-19 DIAGNOSIS — I119 Hypertensive heart disease without heart failure: Secondary | ICD-10-CM | POA: Diagnosis not present

## 2023-08-19 DIAGNOSIS — I2510 Atherosclerotic heart disease of native coronary artery without angina pectoris: Secondary | ICD-10-CM | POA: Diagnosis not present

## 2023-08-19 DIAGNOSIS — E119 Type 2 diabetes mellitus without complications: Secondary | ICD-10-CM | POA: Diagnosis not present

## 2023-08-19 DIAGNOSIS — Z66 Do not resuscitate: Secondary | ICD-10-CM | POA: Diagnosis not present

## 2023-08-19 DIAGNOSIS — Z950 Presence of cardiac pacemaker: Secondary | ICD-10-CM | POA: Diagnosis not present

## 2023-08-19 DIAGNOSIS — J449 Chronic obstructive pulmonary disease, unspecified: Secondary | ICD-10-CM | POA: Diagnosis not present

## 2023-08-22 ENCOUNTER — Ambulatory Visit: Payer: Medicare Other

## 2023-08-22 DIAGNOSIS — Z66 Do not resuscitate: Secondary | ICD-10-CM | POA: Diagnosis not present

## 2023-08-22 DIAGNOSIS — Z950 Presence of cardiac pacemaker: Secondary | ICD-10-CM | POA: Diagnosis not present

## 2023-08-22 DIAGNOSIS — I679 Cerebrovascular disease, unspecified: Secondary | ICD-10-CM

## 2023-08-22 DIAGNOSIS — I2510 Atherosclerotic heart disease of native coronary artery without angina pectoris: Secondary | ICD-10-CM | POA: Diagnosis not present

## 2023-08-22 DIAGNOSIS — J449 Chronic obstructive pulmonary disease, unspecified: Secondary | ICD-10-CM | POA: Diagnosis not present

## 2023-08-22 DIAGNOSIS — E119 Type 2 diabetes mellitus without complications: Secondary | ICD-10-CM | POA: Diagnosis not present

## 2023-08-22 DIAGNOSIS — I119 Hypertensive heart disease without heart failure: Secondary | ICD-10-CM | POA: Diagnosis not present

## 2023-08-22 LAB — CUP PACEART REMOTE DEVICE CHECK
Battery Remaining Longevity: 91 mo
Battery Remaining Percentage: 93 %
Battery Voltage: 3.02 V
Brady Statistic AP VP Percent: 1 %
Brady Statistic AP VS Percent: 6.3 %
Brady Statistic AS VP Percent: 1 %
Brady Statistic AS VS Percent: 93 %
Brady Statistic RA Percent Paced: 4.8 %
Brady Statistic RV Percent Paced: 1 %
Date Time Interrogation Session: 20241025020013
Implantable Lead Connection Status: 753985
Implantable Lead Connection Status: 753985
Implantable Lead Implant Date: 20230706
Implantable Lead Implant Date: 20230706
Implantable Lead Location: 753859
Implantable Lead Location: 753860
Implantable Pulse Generator Implant Date: 20230706
Lead Channel Impedance Value: 480 Ohm
Lead Channel Impedance Value: 530 Ohm
Lead Channel Pacing Threshold Amplitude: 0.5 V
Lead Channel Pacing Threshold Amplitude: 0.5 V
Lead Channel Pacing Threshold Pulse Width: 0.3 ms
Lead Channel Pacing Threshold Pulse Width: 0.5 ms
Lead Channel Sensing Intrinsic Amplitude: 12 mV
Lead Channel Sensing Intrinsic Amplitude: 5 mV
Lead Channel Setting Pacing Amplitude: 3.5 V
Lead Channel Setting Pacing Amplitude: 3.5 V
Lead Channel Setting Pacing Pulse Width: 0.5 ms
Lead Channel Setting Sensing Sensitivity: 2 mV
Pulse Gen Model: 2272
Pulse Gen Serial Number: 8098905

## 2023-08-26 DIAGNOSIS — I2510 Atherosclerotic heart disease of native coronary artery without angina pectoris: Secondary | ICD-10-CM | POA: Diagnosis not present

## 2023-08-26 DIAGNOSIS — Z950 Presence of cardiac pacemaker: Secondary | ICD-10-CM | POA: Diagnosis not present

## 2023-08-26 DIAGNOSIS — J449 Chronic obstructive pulmonary disease, unspecified: Secondary | ICD-10-CM | POA: Diagnosis not present

## 2023-08-26 DIAGNOSIS — E119 Type 2 diabetes mellitus without complications: Secondary | ICD-10-CM | POA: Diagnosis not present

## 2023-08-26 DIAGNOSIS — I119 Hypertensive heart disease without heart failure: Secondary | ICD-10-CM | POA: Diagnosis not present

## 2023-08-26 DIAGNOSIS — Z66 Do not resuscitate: Secondary | ICD-10-CM | POA: Diagnosis not present

## 2023-08-28 DIAGNOSIS — I2510 Atherosclerotic heart disease of native coronary artery without angina pectoris: Secondary | ICD-10-CM | POA: Diagnosis not present

## 2023-08-28 DIAGNOSIS — J449 Chronic obstructive pulmonary disease, unspecified: Secondary | ICD-10-CM | POA: Diagnosis not present

## 2023-08-28 DIAGNOSIS — Z66 Do not resuscitate: Secondary | ICD-10-CM | POA: Diagnosis not present

## 2023-08-28 DIAGNOSIS — E119 Type 2 diabetes mellitus without complications: Secondary | ICD-10-CM | POA: Diagnosis not present

## 2023-08-28 DIAGNOSIS — Z950 Presence of cardiac pacemaker: Secondary | ICD-10-CM | POA: Diagnosis not present

## 2023-08-28 DIAGNOSIS — I119 Hypertensive heart disease without heart failure: Secondary | ICD-10-CM | POA: Diagnosis not present

## 2023-08-29 DIAGNOSIS — I119 Hypertensive heart disease without heart failure: Secondary | ICD-10-CM | POA: Diagnosis not present

## 2023-08-29 DIAGNOSIS — Z950 Presence of cardiac pacemaker: Secondary | ICD-10-CM | POA: Diagnosis not present

## 2023-08-29 DIAGNOSIS — Z66 Do not resuscitate: Secondary | ICD-10-CM | POA: Diagnosis not present

## 2023-08-29 DIAGNOSIS — J449 Chronic obstructive pulmonary disease, unspecified: Secondary | ICD-10-CM | POA: Diagnosis not present

## 2023-08-29 DIAGNOSIS — E119 Type 2 diabetes mellitus without complications: Secondary | ICD-10-CM | POA: Diagnosis not present

## 2023-08-29 DIAGNOSIS — I2510 Atherosclerotic heart disease of native coronary artery without angina pectoris: Secondary | ICD-10-CM | POA: Diagnosis not present

## 2023-09-02 DIAGNOSIS — E119 Type 2 diabetes mellitus without complications: Secondary | ICD-10-CM | POA: Diagnosis not present

## 2023-09-02 DIAGNOSIS — J449 Chronic obstructive pulmonary disease, unspecified: Secondary | ICD-10-CM | POA: Diagnosis not present

## 2023-09-02 DIAGNOSIS — Z66 Do not resuscitate: Secondary | ICD-10-CM | POA: Diagnosis not present

## 2023-09-02 DIAGNOSIS — Z950 Presence of cardiac pacemaker: Secondary | ICD-10-CM | POA: Diagnosis not present

## 2023-09-02 DIAGNOSIS — I2510 Atherosclerotic heart disease of native coronary artery without angina pectoris: Secondary | ICD-10-CM | POA: Diagnosis not present

## 2023-09-02 DIAGNOSIS — I119 Hypertensive heart disease without heart failure: Secondary | ICD-10-CM | POA: Diagnosis not present

## 2023-09-04 DIAGNOSIS — I119 Hypertensive heart disease without heart failure: Secondary | ICD-10-CM | POA: Diagnosis not present

## 2023-09-04 DIAGNOSIS — J449 Chronic obstructive pulmonary disease, unspecified: Secondary | ICD-10-CM | POA: Diagnosis not present

## 2023-09-04 DIAGNOSIS — E119 Type 2 diabetes mellitus without complications: Secondary | ICD-10-CM | POA: Diagnosis not present

## 2023-09-04 DIAGNOSIS — Z66 Do not resuscitate: Secondary | ICD-10-CM | POA: Diagnosis not present

## 2023-09-04 DIAGNOSIS — Z950 Presence of cardiac pacemaker: Secondary | ICD-10-CM | POA: Diagnosis not present

## 2023-09-04 DIAGNOSIS — I2510 Atherosclerotic heart disease of native coronary artery without angina pectoris: Secondary | ICD-10-CM | POA: Diagnosis not present

## 2023-09-05 DIAGNOSIS — I2510 Atherosclerotic heart disease of native coronary artery without angina pectoris: Secondary | ICD-10-CM | POA: Diagnosis not present

## 2023-09-05 DIAGNOSIS — I119 Hypertensive heart disease without heart failure: Secondary | ICD-10-CM | POA: Diagnosis not present

## 2023-09-05 DIAGNOSIS — Z66 Do not resuscitate: Secondary | ICD-10-CM | POA: Diagnosis not present

## 2023-09-05 DIAGNOSIS — E119 Type 2 diabetes mellitus without complications: Secondary | ICD-10-CM | POA: Diagnosis not present

## 2023-09-05 DIAGNOSIS — J449 Chronic obstructive pulmonary disease, unspecified: Secondary | ICD-10-CM | POA: Diagnosis not present

## 2023-09-05 DIAGNOSIS — Z950 Presence of cardiac pacemaker: Secondary | ICD-10-CM | POA: Diagnosis not present

## 2023-09-08 NOTE — Progress Notes (Signed)
Remote pacemaker transmission.   

## 2023-09-09 DIAGNOSIS — J449 Chronic obstructive pulmonary disease, unspecified: Secondary | ICD-10-CM | POA: Diagnosis not present

## 2023-09-09 DIAGNOSIS — E119 Type 2 diabetes mellitus without complications: Secondary | ICD-10-CM | POA: Diagnosis not present

## 2023-09-09 DIAGNOSIS — I119 Hypertensive heart disease without heart failure: Secondary | ICD-10-CM | POA: Diagnosis not present

## 2023-09-09 DIAGNOSIS — Z66 Do not resuscitate: Secondary | ICD-10-CM | POA: Diagnosis not present

## 2023-09-09 DIAGNOSIS — I2510 Atherosclerotic heart disease of native coronary artery without angina pectoris: Secondary | ICD-10-CM | POA: Diagnosis not present

## 2023-09-09 DIAGNOSIS — Z950 Presence of cardiac pacemaker: Secondary | ICD-10-CM | POA: Diagnosis not present

## 2023-09-11 DIAGNOSIS — I2510 Atherosclerotic heart disease of native coronary artery without angina pectoris: Secondary | ICD-10-CM | POA: Diagnosis not present

## 2023-09-11 DIAGNOSIS — I119 Hypertensive heart disease without heart failure: Secondary | ICD-10-CM | POA: Diagnosis not present

## 2023-09-11 DIAGNOSIS — Z950 Presence of cardiac pacemaker: Secondary | ICD-10-CM | POA: Diagnosis not present

## 2023-09-11 DIAGNOSIS — Z66 Do not resuscitate: Secondary | ICD-10-CM | POA: Diagnosis not present

## 2023-09-11 DIAGNOSIS — J449 Chronic obstructive pulmonary disease, unspecified: Secondary | ICD-10-CM | POA: Diagnosis not present

## 2023-09-11 DIAGNOSIS — E119 Type 2 diabetes mellitus without complications: Secondary | ICD-10-CM | POA: Diagnosis not present

## 2023-09-12 DIAGNOSIS — J449 Chronic obstructive pulmonary disease, unspecified: Secondary | ICD-10-CM | POA: Diagnosis not present

## 2023-09-12 DIAGNOSIS — Z950 Presence of cardiac pacemaker: Secondary | ICD-10-CM | POA: Diagnosis not present

## 2023-09-12 DIAGNOSIS — Z66 Do not resuscitate: Secondary | ICD-10-CM | POA: Diagnosis not present

## 2023-09-12 DIAGNOSIS — I119 Hypertensive heart disease without heart failure: Secondary | ICD-10-CM | POA: Diagnosis not present

## 2023-09-12 DIAGNOSIS — I2510 Atherosclerotic heart disease of native coronary artery without angina pectoris: Secondary | ICD-10-CM | POA: Diagnosis not present

## 2023-09-12 DIAGNOSIS — E119 Type 2 diabetes mellitus without complications: Secondary | ICD-10-CM | POA: Diagnosis not present

## 2023-09-16 DIAGNOSIS — E119 Type 2 diabetes mellitus without complications: Secondary | ICD-10-CM | POA: Diagnosis not present

## 2023-09-16 DIAGNOSIS — I2510 Atherosclerotic heart disease of native coronary artery without angina pectoris: Secondary | ICD-10-CM | POA: Diagnosis not present

## 2023-09-16 DIAGNOSIS — Z950 Presence of cardiac pacemaker: Secondary | ICD-10-CM | POA: Diagnosis not present

## 2023-09-16 DIAGNOSIS — I119 Hypertensive heart disease without heart failure: Secondary | ICD-10-CM | POA: Diagnosis not present

## 2023-09-16 DIAGNOSIS — J449 Chronic obstructive pulmonary disease, unspecified: Secondary | ICD-10-CM | POA: Diagnosis not present

## 2023-09-16 DIAGNOSIS — Z66 Do not resuscitate: Secondary | ICD-10-CM | POA: Diagnosis not present

## 2023-09-19 DIAGNOSIS — Z66 Do not resuscitate: Secondary | ICD-10-CM | POA: Diagnosis not present

## 2023-09-19 DIAGNOSIS — J449 Chronic obstructive pulmonary disease, unspecified: Secondary | ICD-10-CM | POA: Diagnosis not present

## 2023-09-19 DIAGNOSIS — Z950 Presence of cardiac pacemaker: Secondary | ICD-10-CM | POA: Diagnosis not present

## 2023-09-19 DIAGNOSIS — I119 Hypertensive heart disease without heart failure: Secondary | ICD-10-CM | POA: Diagnosis not present

## 2023-09-19 DIAGNOSIS — I2510 Atherosclerotic heart disease of native coronary artery without angina pectoris: Secondary | ICD-10-CM | POA: Diagnosis not present

## 2023-09-19 DIAGNOSIS — E119 Type 2 diabetes mellitus without complications: Secondary | ICD-10-CM | POA: Diagnosis not present

## 2023-09-23 DIAGNOSIS — I119 Hypertensive heart disease without heart failure: Secondary | ICD-10-CM | POA: Diagnosis not present

## 2023-09-23 DIAGNOSIS — Z950 Presence of cardiac pacemaker: Secondary | ICD-10-CM | POA: Diagnosis not present

## 2023-09-23 DIAGNOSIS — J449 Chronic obstructive pulmonary disease, unspecified: Secondary | ICD-10-CM | POA: Diagnosis not present

## 2023-09-23 DIAGNOSIS — Z66 Do not resuscitate: Secondary | ICD-10-CM | POA: Diagnosis not present

## 2023-09-23 DIAGNOSIS — E119 Type 2 diabetes mellitus without complications: Secondary | ICD-10-CM | POA: Diagnosis not present

## 2023-09-23 DIAGNOSIS — I2510 Atherosclerotic heart disease of native coronary artery without angina pectoris: Secondary | ICD-10-CM | POA: Diagnosis not present

## 2023-09-24 DIAGNOSIS — I119 Hypertensive heart disease without heart failure: Secondary | ICD-10-CM | POA: Diagnosis not present

## 2023-09-24 DIAGNOSIS — Z66 Do not resuscitate: Secondary | ICD-10-CM | POA: Diagnosis not present

## 2023-09-24 DIAGNOSIS — I2510 Atherosclerotic heart disease of native coronary artery without angina pectoris: Secondary | ICD-10-CM | POA: Diagnosis not present

## 2023-09-24 DIAGNOSIS — J449 Chronic obstructive pulmonary disease, unspecified: Secondary | ICD-10-CM | POA: Diagnosis not present

## 2023-09-24 DIAGNOSIS — Z950 Presence of cardiac pacemaker: Secondary | ICD-10-CM | POA: Diagnosis not present

## 2023-09-24 DIAGNOSIS — E119 Type 2 diabetes mellitus without complications: Secondary | ICD-10-CM | POA: Diagnosis not present

## 2023-09-26 DIAGNOSIS — Z66 Do not resuscitate: Secondary | ICD-10-CM | POA: Diagnosis not present

## 2023-09-26 DIAGNOSIS — J449 Chronic obstructive pulmonary disease, unspecified: Secondary | ICD-10-CM | POA: Diagnosis not present

## 2023-09-26 DIAGNOSIS — E119 Type 2 diabetes mellitus without complications: Secondary | ICD-10-CM | POA: Diagnosis not present

## 2023-09-26 DIAGNOSIS — I2510 Atherosclerotic heart disease of native coronary artery without angina pectoris: Secondary | ICD-10-CM | POA: Diagnosis not present

## 2023-09-26 DIAGNOSIS — I119 Hypertensive heart disease without heart failure: Secondary | ICD-10-CM | POA: Diagnosis not present

## 2023-09-26 DIAGNOSIS — Z950 Presence of cardiac pacemaker: Secondary | ICD-10-CM | POA: Diagnosis not present

## 2023-09-28 DIAGNOSIS — I119 Hypertensive heart disease without heart failure: Secondary | ICD-10-CM | POA: Diagnosis not present

## 2023-09-28 DIAGNOSIS — E119 Type 2 diabetes mellitus without complications: Secondary | ICD-10-CM | POA: Diagnosis not present

## 2023-09-28 DIAGNOSIS — Z950 Presence of cardiac pacemaker: Secondary | ICD-10-CM | POA: Diagnosis not present

## 2023-09-28 DIAGNOSIS — I2510 Atherosclerotic heart disease of native coronary artery without angina pectoris: Secondary | ICD-10-CM | POA: Diagnosis not present

## 2023-09-28 DIAGNOSIS — Z66 Do not resuscitate: Secondary | ICD-10-CM | POA: Diagnosis not present

## 2023-09-28 DIAGNOSIS — J449 Chronic obstructive pulmonary disease, unspecified: Secondary | ICD-10-CM | POA: Diagnosis not present

## 2023-09-30 DIAGNOSIS — I119 Hypertensive heart disease without heart failure: Secondary | ICD-10-CM | POA: Diagnosis not present

## 2023-09-30 DIAGNOSIS — I2510 Atherosclerotic heart disease of native coronary artery without angina pectoris: Secondary | ICD-10-CM | POA: Diagnosis not present

## 2023-09-30 DIAGNOSIS — J449 Chronic obstructive pulmonary disease, unspecified: Secondary | ICD-10-CM | POA: Diagnosis not present

## 2023-09-30 DIAGNOSIS — E119 Type 2 diabetes mellitus without complications: Secondary | ICD-10-CM | POA: Diagnosis not present

## 2023-09-30 DIAGNOSIS — Z66 Do not resuscitate: Secondary | ICD-10-CM | POA: Diagnosis not present

## 2023-09-30 DIAGNOSIS — Z950 Presence of cardiac pacemaker: Secondary | ICD-10-CM | POA: Diagnosis not present

## 2023-10-02 DIAGNOSIS — J449 Chronic obstructive pulmonary disease, unspecified: Secondary | ICD-10-CM | POA: Diagnosis not present

## 2023-10-02 DIAGNOSIS — I119 Hypertensive heart disease without heart failure: Secondary | ICD-10-CM | POA: Diagnosis not present

## 2023-10-02 DIAGNOSIS — Z66 Do not resuscitate: Secondary | ICD-10-CM | POA: Diagnosis not present

## 2023-10-02 DIAGNOSIS — Z950 Presence of cardiac pacemaker: Secondary | ICD-10-CM | POA: Diagnosis not present

## 2023-10-02 DIAGNOSIS — E119 Type 2 diabetes mellitus without complications: Secondary | ICD-10-CM | POA: Diagnosis not present

## 2023-10-02 DIAGNOSIS — I2510 Atherosclerotic heart disease of native coronary artery without angina pectoris: Secondary | ICD-10-CM | POA: Diagnosis not present

## 2023-10-03 DIAGNOSIS — E119 Type 2 diabetes mellitus without complications: Secondary | ICD-10-CM | POA: Diagnosis not present

## 2023-10-03 DIAGNOSIS — J449 Chronic obstructive pulmonary disease, unspecified: Secondary | ICD-10-CM | POA: Diagnosis not present

## 2023-10-03 DIAGNOSIS — Z950 Presence of cardiac pacemaker: Secondary | ICD-10-CM | POA: Diagnosis not present

## 2023-10-03 DIAGNOSIS — Z66 Do not resuscitate: Secondary | ICD-10-CM | POA: Diagnosis not present

## 2023-10-03 DIAGNOSIS — I119 Hypertensive heart disease without heart failure: Secondary | ICD-10-CM | POA: Diagnosis not present

## 2023-10-03 DIAGNOSIS — I2510 Atherosclerotic heart disease of native coronary artery without angina pectoris: Secondary | ICD-10-CM | POA: Diagnosis not present

## 2023-10-07 DIAGNOSIS — Z66 Do not resuscitate: Secondary | ICD-10-CM | POA: Diagnosis not present

## 2023-10-07 DIAGNOSIS — E119 Type 2 diabetes mellitus without complications: Secondary | ICD-10-CM | POA: Diagnosis not present

## 2023-10-07 DIAGNOSIS — I2510 Atherosclerotic heart disease of native coronary artery without angina pectoris: Secondary | ICD-10-CM | POA: Diagnosis not present

## 2023-10-07 DIAGNOSIS — I119 Hypertensive heart disease without heart failure: Secondary | ICD-10-CM | POA: Diagnosis not present

## 2023-10-07 DIAGNOSIS — J449 Chronic obstructive pulmonary disease, unspecified: Secondary | ICD-10-CM | POA: Diagnosis not present

## 2023-10-07 DIAGNOSIS — Z950 Presence of cardiac pacemaker: Secondary | ICD-10-CM | POA: Diagnosis not present

## 2023-10-08 DIAGNOSIS — J449 Chronic obstructive pulmonary disease, unspecified: Secondary | ICD-10-CM | POA: Diagnosis not present

## 2023-10-08 DIAGNOSIS — I2510 Atherosclerotic heart disease of native coronary artery without angina pectoris: Secondary | ICD-10-CM | POA: Diagnosis not present

## 2023-10-08 DIAGNOSIS — I119 Hypertensive heart disease without heart failure: Secondary | ICD-10-CM | POA: Diagnosis not present

## 2023-10-08 DIAGNOSIS — Z950 Presence of cardiac pacemaker: Secondary | ICD-10-CM | POA: Diagnosis not present

## 2023-10-08 DIAGNOSIS — Z66 Do not resuscitate: Secondary | ICD-10-CM | POA: Diagnosis not present

## 2023-10-08 DIAGNOSIS — E119 Type 2 diabetes mellitus without complications: Secondary | ICD-10-CM | POA: Diagnosis not present

## 2023-10-10 DIAGNOSIS — I2510 Atherosclerotic heart disease of native coronary artery without angina pectoris: Secondary | ICD-10-CM | POA: Diagnosis not present

## 2023-10-10 DIAGNOSIS — Z66 Do not resuscitate: Secondary | ICD-10-CM | POA: Diagnosis not present

## 2023-10-10 DIAGNOSIS — J449 Chronic obstructive pulmonary disease, unspecified: Secondary | ICD-10-CM | POA: Diagnosis not present

## 2023-10-10 DIAGNOSIS — Z950 Presence of cardiac pacemaker: Secondary | ICD-10-CM | POA: Diagnosis not present

## 2023-10-10 DIAGNOSIS — E119 Type 2 diabetes mellitus without complications: Secondary | ICD-10-CM | POA: Diagnosis not present

## 2023-10-10 DIAGNOSIS — I119 Hypertensive heart disease without heart failure: Secondary | ICD-10-CM | POA: Diagnosis not present

## 2023-10-13 DIAGNOSIS — J449 Chronic obstructive pulmonary disease, unspecified: Secondary | ICD-10-CM | POA: Diagnosis not present

## 2023-10-13 DIAGNOSIS — Z66 Do not resuscitate: Secondary | ICD-10-CM | POA: Diagnosis not present

## 2023-10-13 DIAGNOSIS — Z950 Presence of cardiac pacemaker: Secondary | ICD-10-CM | POA: Diagnosis not present

## 2023-10-13 DIAGNOSIS — I119 Hypertensive heart disease without heart failure: Secondary | ICD-10-CM | POA: Diagnosis not present

## 2023-10-13 DIAGNOSIS — I2510 Atherosclerotic heart disease of native coronary artery without angina pectoris: Secondary | ICD-10-CM | POA: Diagnosis not present

## 2023-10-13 DIAGNOSIS — E119 Type 2 diabetes mellitus without complications: Secondary | ICD-10-CM | POA: Diagnosis not present

## 2023-10-14 DIAGNOSIS — I119 Hypertensive heart disease without heart failure: Secondary | ICD-10-CM | POA: Diagnosis not present

## 2023-10-14 DIAGNOSIS — I2510 Atherosclerotic heart disease of native coronary artery without angina pectoris: Secondary | ICD-10-CM | POA: Diagnosis not present

## 2023-10-14 DIAGNOSIS — J449 Chronic obstructive pulmonary disease, unspecified: Secondary | ICD-10-CM | POA: Diagnosis not present

## 2023-10-14 DIAGNOSIS — Z66 Do not resuscitate: Secondary | ICD-10-CM | POA: Diagnosis not present

## 2023-10-14 DIAGNOSIS — E119 Type 2 diabetes mellitus without complications: Secondary | ICD-10-CM | POA: Diagnosis not present

## 2023-10-14 DIAGNOSIS — Z950 Presence of cardiac pacemaker: Secondary | ICD-10-CM | POA: Diagnosis not present

## 2023-10-15 DIAGNOSIS — E119 Type 2 diabetes mellitus without complications: Secondary | ICD-10-CM | POA: Diagnosis not present

## 2023-10-15 DIAGNOSIS — Z950 Presence of cardiac pacemaker: Secondary | ICD-10-CM | POA: Diagnosis not present

## 2023-10-15 DIAGNOSIS — J449 Chronic obstructive pulmonary disease, unspecified: Secondary | ICD-10-CM | POA: Diagnosis not present

## 2023-10-15 DIAGNOSIS — Z66 Do not resuscitate: Secondary | ICD-10-CM | POA: Diagnosis not present

## 2023-10-15 DIAGNOSIS — I119 Hypertensive heart disease without heart failure: Secondary | ICD-10-CM | POA: Diagnosis not present

## 2023-10-15 DIAGNOSIS — I2510 Atherosclerotic heart disease of native coronary artery without angina pectoris: Secondary | ICD-10-CM | POA: Diagnosis not present

## 2023-10-16 DIAGNOSIS — J449 Chronic obstructive pulmonary disease, unspecified: Secondary | ICD-10-CM | POA: Diagnosis not present

## 2023-10-16 DIAGNOSIS — I2510 Atherosclerotic heart disease of native coronary artery without angina pectoris: Secondary | ICD-10-CM | POA: Diagnosis not present

## 2023-10-16 DIAGNOSIS — Z66 Do not resuscitate: Secondary | ICD-10-CM | POA: Diagnosis not present

## 2023-10-16 DIAGNOSIS — Z950 Presence of cardiac pacemaker: Secondary | ICD-10-CM | POA: Diagnosis not present

## 2023-10-16 DIAGNOSIS — I119 Hypertensive heart disease without heart failure: Secondary | ICD-10-CM | POA: Diagnosis not present

## 2023-10-16 DIAGNOSIS — E119 Type 2 diabetes mellitus without complications: Secondary | ICD-10-CM | POA: Diagnosis not present

## 2023-10-21 DIAGNOSIS — Z66 Do not resuscitate: Secondary | ICD-10-CM | POA: Diagnosis not present

## 2023-10-21 DIAGNOSIS — I2510 Atherosclerotic heart disease of native coronary artery without angina pectoris: Secondary | ICD-10-CM | POA: Diagnosis not present

## 2023-10-21 DIAGNOSIS — I119 Hypertensive heart disease without heart failure: Secondary | ICD-10-CM | POA: Diagnosis not present

## 2023-10-21 DIAGNOSIS — E119 Type 2 diabetes mellitus without complications: Secondary | ICD-10-CM | POA: Diagnosis not present

## 2023-10-21 DIAGNOSIS — J449 Chronic obstructive pulmonary disease, unspecified: Secondary | ICD-10-CM | POA: Diagnosis not present

## 2023-10-21 DIAGNOSIS — Z950 Presence of cardiac pacemaker: Secondary | ICD-10-CM | POA: Diagnosis not present

## 2023-10-24 DIAGNOSIS — I119 Hypertensive heart disease without heart failure: Secondary | ICD-10-CM | POA: Diagnosis not present

## 2023-10-24 DIAGNOSIS — J449 Chronic obstructive pulmonary disease, unspecified: Secondary | ICD-10-CM | POA: Diagnosis not present

## 2023-10-24 DIAGNOSIS — E119 Type 2 diabetes mellitus without complications: Secondary | ICD-10-CM | POA: Diagnosis not present

## 2023-10-24 DIAGNOSIS — I2510 Atherosclerotic heart disease of native coronary artery without angina pectoris: Secondary | ICD-10-CM | POA: Diagnosis not present

## 2023-10-24 DIAGNOSIS — Z66 Do not resuscitate: Secondary | ICD-10-CM | POA: Diagnosis not present

## 2023-10-24 DIAGNOSIS — Z950 Presence of cardiac pacemaker: Secondary | ICD-10-CM | POA: Diagnosis not present

## 2023-10-27 DIAGNOSIS — Z950 Presence of cardiac pacemaker: Secondary | ICD-10-CM | POA: Diagnosis not present

## 2023-10-27 DIAGNOSIS — J449 Chronic obstructive pulmonary disease, unspecified: Secondary | ICD-10-CM | POA: Diagnosis not present

## 2023-10-27 DIAGNOSIS — Z66 Do not resuscitate: Secondary | ICD-10-CM | POA: Diagnosis not present

## 2023-10-27 DIAGNOSIS — I2510 Atherosclerotic heart disease of native coronary artery without angina pectoris: Secondary | ICD-10-CM | POA: Diagnosis not present

## 2023-10-27 DIAGNOSIS — I119 Hypertensive heart disease without heart failure: Secondary | ICD-10-CM | POA: Diagnosis not present

## 2023-10-27 DIAGNOSIS — E119 Type 2 diabetes mellitus without complications: Secondary | ICD-10-CM | POA: Diagnosis not present

## 2023-10-29 DIAGNOSIS — Z66 Do not resuscitate: Secondary | ICD-10-CM | POA: Diagnosis not present

## 2023-10-29 DIAGNOSIS — Z950 Presence of cardiac pacemaker: Secondary | ICD-10-CM | POA: Diagnosis not present

## 2023-10-29 DIAGNOSIS — J449 Chronic obstructive pulmonary disease, unspecified: Secondary | ICD-10-CM | POA: Diagnosis not present

## 2023-10-29 DIAGNOSIS — E119 Type 2 diabetes mellitus without complications: Secondary | ICD-10-CM | POA: Diagnosis not present

## 2023-10-29 DIAGNOSIS — I2510 Atherosclerotic heart disease of native coronary artery without angina pectoris: Secondary | ICD-10-CM | POA: Diagnosis not present

## 2023-10-29 DIAGNOSIS — I119 Hypertensive heart disease without heart failure: Secondary | ICD-10-CM | POA: Diagnosis not present

## 2023-10-31 DIAGNOSIS — Z66 Do not resuscitate: Secondary | ICD-10-CM | POA: Diagnosis not present

## 2023-10-31 DIAGNOSIS — Z950 Presence of cardiac pacemaker: Secondary | ICD-10-CM | POA: Diagnosis not present

## 2023-10-31 DIAGNOSIS — I119 Hypertensive heart disease without heart failure: Secondary | ICD-10-CM | POA: Diagnosis not present

## 2023-10-31 DIAGNOSIS — J449 Chronic obstructive pulmonary disease, unspecified: Secondary | ICD-10-CM | POA: Diagnosis not present

## 2023-10-31 DIAGNOSIS — E119 Type 2 diabetes mellitus without complications: Secondary | ICD-10-CM | POA: Diagnosis not present

## 2023-10-31 DIAGNOSIS — I2510 Atherosclerotic heart disease of native coronary artery without angina pectoris: Secondary | ICD-10-CM | POA: Diagnosis not present

## 2023-11-03 DIAGNOSIS — Z950 Presence of cardiac pacemaker: Secondary | ICD-10-CM | POA: Diagnosis not present

## 2023-11-03 DIAGNOSIS — E119 Type 2 diabetes mellitus without complications: Secondary | ICD-10-CM | POA: Diagnosis not present

## 2023-11-03 DIAGNOSIS — Z66 Do not resuscitate: Secondary | ICD-10-CM | POA: Diagnosis not present

## 2023-11-03 DIAGNOSIS — I2510 Atherosclerotic heart disease of native coronary artery without angina pectoris: Secondary | ICD-10-CM | POA: Diagnosis not present

## 2023-11-03 DIAGNOSIS — I119 Hypertensive heart disease without heart failure: Secondary | ICD-10-CM | POA: Diagnosis not present

## 2023-11-03 DIAGNOSIS — J449 Chronic obstructive pulmonary disease, unspecified: Secondary | ICD-10-CM | POA: Diagnosis not present

## 2023-11-04 DIAGNOSIS — I119 Hypertensive heart disease without heart failure: Secondary | ICD-10-CM | POA: Diagnosis not present

## 2023-11-04 DIAGNOSIS — J449 Chronic obstructive pulmonary disease, unspecified: Secondary | ICD-10-CM | POA: Diagnosis not present

## 2023-11-04 DIAGNOSIS — Z950 Presence of cardiac pacemaker: Secondary | ICD-10-CM | POA: Diagnosis not present

## 2023-11-04 DIAGNOSIS — E119 Type 2 diabetes mellitus without complications: Secondary | ICD-10-CM | POA: Diagnosis not present

## 2023-11-04 DIAGNOSIS — Z66 Do not resuscitate: Secondary | ICD-10-CM | POA: Diagnosis not present

## 2023-11-04 DIAGNOSIS — I2510 Atherosclerotic heart disease of native coronary artery without angina pectoris: Secondary | ICD-10-CM | POA: Diagnosis not present

## 2023-11-06 DIAGNOSIS — Z950 Presence of cardiac pacemaker: Secondary | ICD-10-CM | POA: Diagnosis not present

## 2023-11-06 DIAGNOSIS — Z66 Do not resuscitate: Secondary | ICD-10-CM | POA: Diagnosis not present

## 2023-11-06 DIAGNOSIS — J449 Chronic obstructive pulmonary disease, unspecified: Secondary | ICD-10-CM | POA: Diagnosis not present

## 2023-11-06 DIAGNOSIS — I2510 Atherosclerotic heart disease of native coronary artery without angina pectoris: Secondary | ICD-10-CM | POA: Diagnosis not present

## 2023-11-06 DIAGNOSIS — I119 Hypertensive heart disease without heart failure: Secondary | ICD-10-CM | POA: Diagnosis not present

## 2023-11-06 DIAGNOSIS — E119 Type 2 diabetes mellitus without complications: Secondary | ICD-10-CM | POA: Diagnosis not present

## 2023-11-07 DIAGNOSIS — I2510 Atherosclerotic heart disease of native coronary artery without angina pectoris: Secondary | ICD-10-CM | POA: Diagnosis not present

## 2023-11-07 DIAGNOSIS — J449 Chronic obstructive pulmonary disease, unspecified: Secondary | ICD-10-CM | POA: Diagnosis not present

## 2023-11-07 DIAGNOSIS — I119 Hypertensive heart disease without heart failure: Secondary | ICD-10-CM | POA: Diagnosis not present

## 2023-11-07 DIAGNOSIS — Z66 Do not resuscitate: Secondary | ICD-10-CM | POA: Diagnosis not present

## 2023-11-07 DIAGNOSIS — Z950 Presence of cardiac pacemaker: Secondary | ICD-10-CM | POA: Diagnosis not present

## 2023-11-07 DIAGNOSIS — E119 Type 2 diabetes mellitus without complications: Secondary | ICD-10-CM | POA: Diagnosis not present

## 2023-11-11 DIAGNOSIS — E119 Type 2 diabetes mellitus without complications: Secondary | ICD-10-CM | POA: Diagnosis not present

## 2023-11-11 DIAGNOSIS — I2510 Atherosclerotic heart disease of native coronary artery without angina pectoris: Secondary | ICD-10-CM | POA: Diagnosis not present

## 2023-11-11 DIAGNOSIS — J449 Chronic obstructive pulmonary disease, unspecified: Secondary | ICD-10-CM | POA: Diagnosis not present

## 2023-11-11 DIAGNOSIS — I119 Hypertensive heart disease without heart failure: Secondary | ICD-10-CM | POA: Diagnosis not present

## 2023-11-11 DIAGNOSIS — Z66 Do not resuscitate: Secondary | ICD-10-CM | POA: Diagnosis not present

## 2023-11-11 DIAGNOSIS — Z950 Presence of cardiac pacemaker: Secondary | ICD-10-CM | POA: Diagnosis not present

## 2023-11-14 DIAGNOSIS — J449 Chronic obstructive pulmonary disease, unspecified: Secondary | ICD-10-CM | POA: Diagnosis not present

## 2023-11-14 DIAGNOSIS — I119 Hypertensive heart disease without heart failure: Secondary | ICD-10-CM | POA: Diagnosis not present

## 2023-11-14 DIAGNOSIS — Z950 Presence of cardiac pacemaker: Secondary | ICD-10-CM | POA: Diagnosis not present

## 2023-11-14 DIAGNOSIS — I2510 Atherosclerotic heart disease of native coronary artery without angina pectoris: Secondary | ICD-10-CM | POA: Diagnosis not present

## 2023-11-14 DIAGNOSIS — E119 Type 2 diabetes mellitus without complications: Secondary | ICD-10-CM | POA: Diagnosis not present

## 2023-11-14 DIAGNOSIS — Z66 Do not resuscitate: Secondary | ICD-10-CM | POA: Diagnosis not present

## 2023-11-18 DIAGNOSIS — I2510 Atherosclerotic heart disease of native coronary artery without angina pectoris: Secondary | ICD-10-CM | POA: Diagnosis not present

## 2023-11-18 DIAGNOSIS — Z950 Presence of cardiac pacemaker: Secondary | ICD-10-CM | POA: Diagnosis not present

## 2023-11-18 DIAGNOSIS — Z66 Do not resuscitate: Secondary | ICD-10-CM | POA: Diagnosis not present

## 2023-11-18 DIAGNOSIS — E119 Type 2 diabetes mellitus without complications: Secondary | ICD-10-CM | POA: Diagnosis not present

## 2023-11-18 DIAGNOSIS — J449 Chronic obstructive pulmonary disease, unspecified: Secondary | ICD-10-CM | POA: Diagnosis not present

## 2023-11-18 DIAGNOSIS — I119 Hypertensive heart disease without heart failure: Secondary | ICD-10-CM | POA: Diagnosis not present

## 2023-11-20 DIAGNOSIS — J449 Chronic obstructive pulmonary disease, unspecified: Secondary | ICD-10-CM | POA: Diagnosis not present

## 2023-11-20 DIAGNOSIS — Z950 Presence of cardiac pacemaker: Secondary | ICD-10-CM | POA: Diagnosis not present

## 2023-11-20 DIAGNOSIS — E119 Type 2 diabetes mellitus without complications: Secondary | ICD-10-CM | POA: Diagnosis not present

## 2023-11-20 DIAGNOSIS — Z66 Do not resuscitate: Secondary | ICD-10-CM | POA: Diagnosis not present

## 2023-11-20 DIAGNOSIS — I2510 Atherosclerotic heart disease of native coronary artery without angina pectoris: Secondary | ICD-10-CM | POA: Diagnosis not present

## 2023-11-20 DIAGNOSIS — I119 Hypertensive heart disease without heart failure: Secondary | ICD-10-CM | POA: Diagnosis not present

## 2023-11-21 ENCOUNTER — Ambulatory Visit (INDEPENDENT_AMBULATORY_CARE_PROVIDER_SITE_OTHER)

## 2023-11-21 DIAGNOSIS — Z66 Do not resuscitate: Secondary | ICD-10-CM | POA: Diagnosis not present

## 2023-11-21 DIAGNOSIS — I2510 Atherosclerotic heart disease of native coronary artery without angina pectoris: Secondary | ICD-10-CM | POA: Diagnosis not present

## 2023-11-21 DIAGNOSIS — J449 Chronic obstructive pulmonary disease, unspecified: Secondary | ICD-10-CM | POA: Diagnosis not present

## 2023-11-21 DIAGNOSIS — E119 Type 2 diabetes mellitus without complications: Secondary | ICD-10-CM | POA: Diagnosis not present

## 2023-11-21 DIAGNOSIS — I679 Cerebrovascular disease, unspecified: Secondary | ICD-10-CM | POA: Diagnosis not present

## 2023-11-21 DIAGNOSIS — I119 Hypertensive heart disease without heart failure: Secondary | ICD-10-CM | POA: Diagnosis not present

## 2023-11-21 DIAGNOSIS — Z950 Presence of cardiac pacemaker: Secondary | ICD-10-CM | POA: Diagnosis not present

## 2023-11-24 LAB — CUP PACEART REMOTE DEVICE CHECK
Battery Remaining Longevity: 88 mo
Battery Remaining Percentage: 91 %
Battery Voltage: 3.01 V
Brady Statistic AP VP Percent: 1 %
Brady Statistic AP VS Percent: 7.5 %
Brady Statistic AS VP Percent: 1 %
Brady Statistic AS VS Percent: 91 %
Brady Statistic RA Percent Paced: 6 %
Brady Statistic RV Percent Paced: 1 %
Date Time Interrogation Session: 20250124020016
Implantable Lead Connection Status: 753985
Implantable Lead Connection Status: 753985
Implantable Lead Implant Date: 20230706
Implantable Lead Implant Date: 20230706
Implantable Lead Location: 753859
Implantable Lead Location: 753860
Implantable Pulse Generator Implant Date: 20230706
Lead Channel Impedance Value: 450 Ohm
Lead Channel Impedance Value: 510 Ohm
Lead Channel Pacing Threshold Amplitude: 0.5 V
Lead Channel Pacing Threshold Amplitude: 0.5 V
Lead Channel Pacing Threshold Pulse Width: 0.3 ms
Lead Channel Pacing Threshold Pulse Width: 0.5 ms
Lead Channel Sensing Intrinsic Amplitude: 12 mV
Lead Channel Sensing Intrinsic Amplitude: 5 mV
Lead Channel Setting Pacing Amplitude: 3.5 V
Lead Channel Setting Pacing Amplitude: 3.5 V
Lead Channel Setting Pacing Pulse Width: 0.5 ms
Lead Channel Setting Sensing Sensitivity: 2 mV
Pulse Gen Model: 2272
Pulse Gen Serial Number: 8098905

## 2023-11-25 ENCOUNTER — Other Ambulatory Visit: Payer: Self-pay

## 2023-11-25 ENCOUNTER — Inpatient Hospital Stay (HOSPITAL_COMMUNITY)
Admission: EM | Admit: 2023-11-25 | Discharge: 2023-12-03 | DRG: 988 | Disposition: A | Discharge status: Nursing Home (Includes ICF) | Payer: Medicare Other | Attending: Surgery | Admitting: Surgery

## 2023-11-25 ENCOUNTER — Emergency Department (HOSPITAL_COMMUNITY): Payer: Medicare Other

## 2023-11-25 DIAGNOSIS — S0634AA Traumatic hemorrhage of right cerebrum with loss of consciousness status unknown, initial encounter: Secondary | ICD-10-CM | POA: Diagnosis present

## 2023-11-25 DIAGNOSIS — E785 Hyperlipidemia, unspecified: Secondary | ICD-10-CM | POA: Diagnosis present

## 2023-11-25 DIAGNOSIS — W19XXXA Unspecified fall, initial encounter: Principal | ICD-10-CM

## 2023-11-25 DIAGNOSIS — R627 Adult failure to thrive: Secondary | ICD-10-CM | POA: Diagnosis present

## 2023-11-25 DIAGNOSIS — Z7982 Long term (current) use of aspirin: Secondary | ICD-10-CM | POA: Diagnosis not present

## 2023-11-25 DIAGNOSIS — S0990XA Unspecified injury of head, initial encounter: Secondary | ICD-10-CM | POA: Diagnosis not present

## 2023-11-25 DIAGNOSIS — Z681 Body mass index (BMI) 19 or less, adult: Secondary | ICD-10-CM

## 2023-11-25 DIAGNOSIS — Z23 Encounter for immunization: Secondary | ICD-10-CM | POA: Diagnosis not present

## 2023-11-25 DIAGNOSIS — M47816 Spondylosis without myelopathy or radiculopathy, lumbar region: Secondary | ICD-10-CM | POA: Diagnosis not present

## 2023-11-25 DIAGNOSIS — Z8249 Family history of ischemic heart disease and other diseases of the circulatory system: Secondary | ICD-10-CM | POA: Diagnosis not present

## 2023-11-25 DIAGNOSIS — J4489 Other specified chronic obstructive pulmonary disease: Secondary | ICD-10-CM | POA: Diagnosis present

## 2023-11-25 DIAGNOSIS — Z043 Encounter for examination and observation following other accident: Secondary | ICD-10-CM | POA: Diagnosis not present

## 2023-11-25 DIAGNOSIS — Z79899 Other long term (current) drug therapy: Secondary | ICD-10-CM

## 2023-11-25 DIAGNOSIS — E119 Type 2 diabetes mellitus without complications: Secondary | ICD-10-CM | POA: Diagnosis present

## 2023-11-25 DIAGNOSIS — Z515 Encounter for palliative care: Secondary | ICD-10-CM | POA: Diagnosis not present

## 2023-11-25 DIAGNOSIS — I442 Atrioventricular block, complete: Secondary | ICD-10-CM | POA: Diagnosis present

## 2023-11-25 DIAGNOSIS — M1611 Unilateral primary osteoarthritis, right hip: Secondary | ICD-10-CM | POA: Diagnosis not present

## 2023-11-25 DIAGNOSIS — M25559 Pain in unspecified hip: Secondary | ICD-10-CM | POA: Diagnosis not present

## 2023-11-25 DIAGNOSIS — R319 Hematuria, unspecified: Secondary | ICD-10-CM | POA: Diagnosis not present

## 2023-11-25 DIAGNOSIS — Z880 Allergy status to penicillin: Secondary | ICD-10-CM | POA: Diagnosis not present

## 2023-11-25 DIAGNOSIS — I10 Essential (primary) hypertension: Secondary | ICD-10-CM | POA: Diagnosis not present

## 2023-11-25 DIAGNOSIS — R451 Restlessness and agitation: Secondary | ICD-10-CM | POA: Diagnosis not present

## 2023-11-25 DIAGNOSIS — G319 Degenerative disease of nervous system, unspecified: Secondary | ICD-10-CM | POA: Diagnosis not present

## 2023-11-25 DIAGNOSIS — Z87891 Personal history of nicotine dependence: Secondary | ICD-10-CM | POA: Diagnosis not present

## 2023-11-25 DIAGNOSIS — Y92009 Unspecified place in unspecified non-institutional (private) residence as the place of occurrence of the external cause: Secondary | ICD-10-CM | POA: Diagnosis not present

## 2023-11-25 DIAGNOSIS — I252 Old myocardial infarction: Secondary | ICD-10-CM

## 2023-11-25 DIAGNOSIS — Z950 Presence of cardiac pacemaker: Secondary | ICD-10-CM

## 2023-11-25 DIAGNOSIS — S06340A Traumatic hemorrhage of right cerebrum without loss of consciousness, initial encounter: Secondary | ICD-10-CM | POA: Diagnosis not present

## 2023-11-25 DIAGNOSIS — I700 Atherosclerosis of aorta: Secondary | ICD-10-CM | POA: Diagnosis present

## 2023-11-25 DIAGNOSIS — S0181XA Laceration without foreign body of other part of head, initial encounter: Secondary | ICD-10-CM | POA: Diagnosis not present

## 2023-11-25 DIAGNOSIS — W01198A Fall on same level from slipping, tripping and stumbling with subsequent striking against other object, initial encounter: Secondary | ICD-10-CM | POA: Diagnosis present

## 2023-11-25 DIAGNOSIS — R58 Hemorrhage, not elsewhere classified: Secondary | ICD-10-CM | POA: Diagnosis not present

## 2023-11-25 DIAGNOSIS — S0083XA Contusion of other part of head, initial encounter: Secondary | ICD-10-CM | POA: Diagnosis not present

## 2023-11-25 DIAGNOSIS — I2510 Atherosclerotic heart disease of native coronary artery without angina pectoris: Secondary | ICD-10-CM | POA: Diagnosis present

## 2023-11-25 DIAGNOSIS — S0689AA Other specified intracranial injury with loss of consciousness status unknown, initial encounter: Secondary | ICD-10-CM

## 2023-11-25 DIAGNOSIS — Z66 Do not resuscitate: Secondary | ICD-10-CM | POA: Diagnosis present

## 2023-11-25 DIAGNOSIS — I619 Nontraumatic intracerebral hemorrhage, unspecified: Secondary | ICD-10-CM | POA: Diagnosis present

## 2023-11-25 DIAGNOSIS — Z955 Presence of coronary angioplasty implant and graft: Secondary | ICD-10-CM | POA: Diagnosis not present

## 2023-11-25 DIAGNOSIS — S199XXA Unspecified injury of neck, initial encounter: Secondary | ICD-10-CM | POA: Diagnosis not present

## 2023-11-25 LAB — BASIC METABOLIC PANEL
Anion gap: 15 (ref 5–15)
BUN: 19 mg/dL (ref 8–23)
CO2: 25 mmol/L (ref 22–32)
Calcium: 9.5 mg/dL (ref 8.9–10.3)
Chloride: 98 mmol/L (ref 98–111)
Creatinine, Ser: 1.21 mg/dL (ref 0.61–1.24)
GFR, Estimated: 59 mL/min — ABNORMAL LOW (ref >60)
Glucose, Bld: 162 mg/dL — ABNORMAL HIGH (ref 70–99)
Potassium: 3.4 mmol/L — ABNORMAL LOW (ref 3.5–5.1)
Sodium: 138 mmol/L (ref 135–145)

## 2023-11-25 LAB — CBC
HCT: 39.1 % (ref 39.0–52.0)
Hemoglobin: 13 g/dL (ref 13.0–17.0)
MCH: 31.3 pg (ref 26.0–34.0)
MCHC: 33.2 g/dL (ref 30.0–36.0)
MCV: 94 fL (ref 80.0–100.0)
Platelets: 268 10*3/uL (ref 150–400)
RBC: 4.16 MIL/uL — ABNORMAL LOW (ref 4.22–5.81)
RDW: 13.7 % (ref 11.5–15.5)
WBC: 12.2 10*3/uL — ABNORMAL HIGH (ref 4.0–10.5)
nRBC: 0 % (ref 0.0–0.2)

## 2023-11-25 MED ORDER — TETANUS-DIPHTH-ACELL PERTUSSIS 5-2.5-18.5 LF-MCG/0.5 IM SUSY: 0.5000 mL | PREFILLED_SYRINGE | Freq: Once | INTRAMUSCULAR | Status: DC

## 2023-11-25 MED ORDER — METOPROLOL TARTRATE 5 MG/5ML IV SOLN: 5.0000 mg | Freq: Four times a day (QID) | INTRAVENOUS | Status: DC | PRN

## 2023-11-25 MED ORDER — ONDANSETRON 4 MG PO TBDP: 4.0000 mg | ORAL_TABLET | Freq: Four times a day (QID) | ORAL | Status: DC | PRN

## 2023-11-25 MED ORDER — POLYETHYLENE GLYCOL 3350 17 G PO PACK: 17.0000 g | PACK | Freq: Every day | ORAL | Status: DC | PRN

## 2023-11-25 MED ORDER — ACETAMINOPHEN 500 MG PO TABS
1000.0000 mg | ORAL_TABLET | Freq: Four times a day (QID) | ORAL | Status: DC
  Administered 2023-11-25 – 2023-12-03 (×25): 1000 mg via ORAL
  Filled 2023-11-25 (×28): qty 2

## 2023-11-25 MED ORDER — OXYCODONE HCL 5 MG PO TABS
2.5000 mg | ORAL_TABLET | ORAL | Status: DC | PRN
  Administered 2023-11-27 – 2023-12-03 (×12): 5 mg via ORAL
  Filled 2023-11-25 (×12): qty 1

## 2023-11-25 MED ORDER — METHOCARBAMOL 500 MG PO TABS
500.0000 mg | ORAL_TABLET | Freq: Three times a day (TID) | ORAL | Status: DC
  Administered 2023-11-25 – 2023-11-26 (×3): 500 mg via ORAL
  Filled 2023-11-25 (×3): qty 1

## 2023-11-25 MED ORDER — MORPHINE SULFATE (PF) 2 MG/ML IV SOLN
2.0000 mg | INTRAVENOUS | Status: DC | PRN
  Administered 2023-11-25 – 2023-12-02 (×7): 2 mg via INTRAVENOUS
  Filled 2023-11-25 (×8): qty 1

## 2023-11-25 MED ORDER — HYDRALAZINE HCL 20 MG/ML IJ SOLN: 10.0000 mg | INTRAMUSCULAR | Status: DC | PRN

## 2023-11-25 MED ORDER — LIDOCAINE-EPINEPHRINE (PF) 2 %-1:200000 IJ SOLN
INTRAMUSCULAR | Status: AC
  Filled 2023-11-25: qty 20

## 2023-11-25 MED ORDER — ONDANSETRON HCL 4 MG/2ML IJ SOLN: 4.0000 mg | Freq: Four times a day (QID) | INTRAMUSCULAR | Status: DC | PRN

## 2023-11-25 MED ORDER — DOCUSATE SODIUM 100 MG PO CAPS
100.0000 mg | ORAL_CAPSULE | Freq: Two times a day (BID) | ORAL | Status: DC
  Administered 2023-11-26 – 2023-12-03 (×15): 100 mg via ORAL
  Filled 2023-11-25 (×15): qty 1

## 2023-11-25 NOTE — ED Notes (Signed)
Pt's head cleaned up at this time

## 2023-11-25 NOTE — H&P (Signed)
Reason for Consult/Chief Complaint: TBI Consultant: Andria Meuse, DO  [name] is an 83 y.o. male.   HPI: 29M s/p fall in the bathroom. Patient unable to participate much in history and unable to tell me whether he slipped/tripped or had a syncopal fall. Unable to tell me if he had LOC. Trauma sustained to L side of head.   Per EDP and patient's son, patient is DNR/DNI and on hospice for end-stage COPD but not on O2 on my exam and patient reports he is not on home O2.   Past Medical History:  Diagnosis Date   Asthma    as a child   Carotid arterial disease (HCC)    a. 11/2015 Carotid U/S: LICA 40-59%, RICA 100 CTO, nl subclavian arteries--f/u 1 yr.   Coronary atherosclerosis of native coronary artery    a. 09/2001 inflat MI/PCI: RCA 72m (3.0x18 AVE S7 BMS); b. 07/2006 Cath: LM nl, LAD 50p, LCX min irregs, OM1 large, min irregs, OM2 min irregs, RCA 20 ISR;  c. 10/2012 MV: EF 74%, no ischemia.   Essential hypertension    Heart murmur    since a child   Hyperlipidemia    Myocardial infarction [facility]) 2002   Pneumonia    age 35   Right inguinal hernia     Past Surgical History:  Procedure Laterality Date   CARDIAC CATHETERIZATION     Carotid stents     COLONOSCOPY     FRACTURE SURGERY     HERNIA REPAIR     ventral   INGUINAL HERNIA REPAIR Right 11/01/2016   Procedure: RIGHT INGUINAL HERNIA REPAIR WITH MESH;  Surgeon: Avel Peace, MD;  Location[facility] OR;  Service: General;  Laterality: Right;   INSERTION OF MESH Right 11/01/2016   Procedure: INSERTION OF MESH;  Surgeon: Avel Peace, MD;  Location: [facility] OR;  Service: General;  Laterality: Right;   ORIF SHOULDER FRACTURE Right    PACEMAKER IMPLANT N/A 05/02/2022   Procedure: PACEMAKER IMPLANT;  Surgeon: Lanier Prude, MD;  Location: [facility] INVASIVE CV LAB;  Service: Cardiovascular;  Laterality: N/A;    Family History  Problem Relation Age of Onset   Hypertension Other     Social History:  reports that he quit smoking about  25 years ago. His smoking use included cigarettes. He started smoking about 69 years ago. He has a 44 pack-year smoking history. He has quit using smokeless tobacco.  His smokeless tobacco use included chew. He reports that he does not drink alcohol and does not use drugs.  Allergies:  Allergies  Allergen Reactions   Penicillins Hives and Swelling    Has patient had a PCN reaction causing immediate rash, facial/tongue/throat swelling, SOB or lightheadedness with hypotension: Yes Has patient had a PCN reaction causing severe rash involving mucus membranes or skin necrosis: Yes Has patient had a PCN reaction that required hospitalization No Has patient had a PCN reaction occurring within the last 10 years: No If all of the above answers are "NO", then may proceed with Cephalosporin use.     Medications: I have reviewed the patient's current medications.  Results for orders placed or performed during the hospital encounter of 11/25/23 (from the past 48 hours)  CBC     Status: Abnormal   Collection Time: 11/25/23  4:22 PM  Result Value Ref Range   WBC 12.2 (H) 4.0 - 10.5 K/uL   RBC 4.16 (L) 4.22 - 5.81 MIL/uL   Hemoglobin 13.0 13.0 - 17.0 g/dL  HCT 39.1 39.0 - 52.0 %   MCV 94.0 80.0 - 100.0 fL   MCH 31.3 26.0 - 34.0 pg   MCHC 33.2 30.0 - 36.0 g/dL   RDW 16.1 09.6 - 04.5 %   Platelets 268 150 - 400 K/uL   nRBC 0.0 0.0 - 0.2 %    Comment: Performed at [facility], [address], [place], [place] [address]  Basic metabolic panel     Status: Abnormal   Collection Time: 11/25/23  4:22 PM  Result Value Ref Range   Sodium 138 135 - 145 mmol/L   Potassium 3.4 (L) 3.5 - 5.1 mmol/L   Chloride 98 98 - 111 mmol/L   CO2 25 22 - 32 mmol/L   Glucose, Bld 162 (H) 70 - 99 mg/dL    Comment: Glucose reference range applies only to samples taken after fasting for at least 8 hours.   BUN 19 8 - 23 mg/dL   Creatinine, Ser 1.91 0.61 - 1.24 mg/dL   Calcium 9.5 8.9 - 47.8 mg/dL   GFR,  Estimated 59 (L) >60 mL/min    Comment: (NOTE) Calculated using the CKD-EPI Creatinine Equation (2021)    Anion gap 15 5 - 15    Comment: Performed at [facility], [address], [place], [place] [address]    CT Head Wo Contrast Result Date: 11/25/2023 CLINICAL DATA:  Head trauma, moderate-severe; Neck trauma (Age >= 65y). Fall with forehead strike. EXAM: CT HEAD WITHOUT CONTRAST CT CERVICAL SPINE WITHOUT CONTRAST TECHNIQUE: Multidetector CT imaging of the head and cervical spine was performed following the standard protocol without intravenous contrast. Multiplanar CT image reconstructions of the cervical spine were also generated. RADIATION DOSE REDUCTION: This exam was performed according to the departmental dose-optimization program which includes automated exposure control, adjustment of the mA and/or kV according to patient size and/or use of iterative reconstruction technique. COMPARISON:  CT head and cervical spine 05/15/2022. MRI head 04/27/2022. FINDINGS: CT HEAD FINDINGS Brain: There is a new 5 mm round hyperdense focus superficially in the right frontal lobe at the anterior aspect of the operculum, suspicious for a small acute hemorrhage (series 6, image 22 and series 5, image 52). There is no associated edema. No intracranial hemorrhage is identified elsewhere. There is no evidence of an acute infarct, midline shift, or extra-axial fluid collection. Patchy cerebral white matter hypodensities are stable to minimally increased and are nonspecific but compatible with moderate chronic small vessel ischemic disease. There is moderate cerebral atrophy. Vascular: Calcified atherosclerosis at the skull base. No hyperdense vessel. Skull: No acute fracture or suspicious osseous lesion. Sinuses/Orbits: Completely opacified right maxillary sinus by hyperdense/inspissated material. Clear mastoid air cells. Unremarkable orbits. Other: Left lateral forehead hematoma. CT CERVICAL SPINE FINDINGS  Alignment: Mild reversal of the normal cervical lordosis. Mild right convex curvature of the cervical spine. Trace anterolisthesis of C3 on C4, T1 on T2, and T2 on T3. Unchanged 5 mm anterolisthesis of C7 on T1. Skull base and vertebrae: No acute fracture or suspicious osseous lesion. Soft tissues and spinal canal: No prevertebral fluid or swelling. No visible canal hematoma. Disc levels: Similar appearance of advanced disc degeneration from C5-6 through C7-T1 with milder disc degeneration elsewhere. Widespread advanced cervical facet arthrosis. Mild-to-moderate multilevel spinal stenosis and moderate to severe multilevel neural foraminal stenosis. Upper chest: Emphysema and scarring in the lung apices. Other: Mild atherosclerotic calcification at the carotid bifurcations. Critical Value/emergent results were called by telephone at the time of interpretation  on 11/25/2023 at 4:32 pm to Dr. Andria Meuse, who verbally acknowledged these results. IMPRESSION: 1. New 5 mm hyperdense focus in the anterior right frontal lobe suspicious for a small acute hemorrhage/hemorrhagic contusion. No edema. 2. Left lateral forehead hematoma. 3. No acute cervical spine fracture. Electronically Signed   By: Sebastian Ache M.D.   On: 11/25/2023 16:32   CT Cervical Spine Wo Contrast Result Date: 11/25/2023 CLINICAL DATA:  Head trauma, moderate-severe; Neck trauma (Age >= 65y). Fall with forehead strike. EXAM: CT HEAD WITHOUT CONTRAST CT CERVICAL SPINE WITHOUT CONTRAST TECHNIQUE: Multidetector CT imaging of the head and cervical spine was performed following the standard protocol without intravenous contrast. Multiplanar CT image reconstructions of the cervical spine were also generated. RADIATION DOSE REDUCTION: This exam was performed according to the departmental dose-optimization program which includes automated exposure control, adjustment of the mA and/or kV according to patient size and/or use of iterative reconstruction technique.  COMPARISON:  CT head and cervical spine 05/15/2022. MRI head 04/27/2022. FINDINGS: CT HEAD FINDINGS Brain: There is a new 5 mm round hyperdense focus superficially in the right frontal lobe at the anterior aspect of the operculum, suspicious for a small acute hemorrhage (series 6, image 22 and series 5, image 52). There is no associated edema. No intracranial hemorrhage is identified elsewhere. There is no evidence of an acute infarct, midline shift, or extra-axial fluid collection. Patchy cerebral white matter hypodensities are stable to minimally increased and are nonspecific but compatible with moderate chronic small vessel ischemic disease. There is moderate cerebral atrophy. Vascular: Calcified atherosclerosis at the skull base. No hyperdense vessel. Skull: No acute fracture or suspicious osseous lesion. Sinuses/Orbits: Completely opacified right maxillary sinus by hyperdense/inspissated material. Clear mastoid air cells. Unremarkable orbits. Other: Left lateral forehead hematoma. CT CERVICAL SPINE FINDINGS Alignment: Mild reversal of the normal cervical lordosis. Mild right convex curvature of the cervical spine. Trace anterolisthesis of C3 on C4, T1 on T2, and T2 on T3. Unchanged 5 mm anterolisthesis of C7 on T1. Skull base and vertebrae: No acute fracture or suspicious osseous lesion. Soft tissues and spinal canal: No prevertebral fluid or swelling. No visible canal hematoma. Disc levels: Similar appearance of advanced disc degeneration from C5-6 through C7-T1 with milder disc degeneration elsewhere. Widespread advanced cervical facet arthrosis. Mild-to-moderate multilevel spinal stenosis and moderate to severe multilevel neural foraminal stenosis. Upper chest: Emphysema and scarring in the lung apices. Other: Mild atherosclerotic calcification at the carotid bifurcations. Critical Value/emergent results were called by telephone at the time of interpretation on 11/25/2023 at 4:32 pm to Dr. Andria Meuse, who  verbally acknowledged these results. IMPRESSION: 1. New 5 mm hyperdense focus in the anterior right frontal lobe suspicious for a small acute hemorrhage/hemorrhagic contusion. No edema. 2. Left lateral forehead hematoma. 3. No acute cervical spine fracture. Electronically Signed   By: Sebastian Ache M.D.   On: 11/25/2023 16:32   DG Chest 1 View Result Date: 11/25/2023 CLINICAL DATA:  [id number] Fall [id number] EXAM: CHEST  1 VIEW COMPARISON:  05/15/2022 FINDINGS: Lungs are clear. No pneumothorax. Stable left subclavian pacemaker. Heart size and mediastinal contours are within normal limits. Aortic Atherosclerosis (ICD10-170.0). No effusion. Visualized bones unremarkable. IMPRESSION: No acute cardiopulmonary disease. Electronically Signed   By: Corlis Leak M.D.   On: 11/25/2023 16:25   DG Pelvis 1-2 Views Result Date: 11/25/2023 CLINICAL DATA:  Fall.  Pain. EXAM: PELVIS - 1-2 VIEW COMPARISON:  Pelvic radiograph dated 08/15/2022. FINDINGS: There is no acute fracture or dislocation. There is moderate arthritic  changes of the right hip. Degenerative changes of the lower lumbar spine. The soft tissues are unremarkable. IMPRESSION: 1. No acute fracture or dislocation. 2. Moderate arthritic changes of the right hip. Electronically Signed   By: Elgie Collard M.D.   On: 11/25/2023 16:24    ROS 10 point review of systems is negative except as listed above in HPI.   Physical Exam Blood pressure 128/61, pulse 74, temperature 98.6 F (37 C), resp. rate 16, height 5\' 9"  (1.753 m), weight 60.3 kg, SpO2 95%. Constitutional: well-developed, well-nourished HEENT: pupils equal, round, reactive to light, 2mm b/l, moist conjunctiva, external inspection of ears and nose normal, hearing intact Oropharynx: normal oropharyngeal mucosa, poor dentition Neck: no thyromegaly, trachea midline, no midline cervical tenderness to palpation Chest: breath sounds equal bilaterally, normal respiratory effort, no midline or lateral chest wall  tenderness to palpation/deformity Abdomen: soft, NT, no bruising, no hepatosplenomegaly GU: no blood at urethral meatus of penis, no scrotal masses or abnormality, condom cath on Back: no wounds, no thoracic/lumbar spine tenderness to palpation, no thoracic/lumbar spine stepoffs Extremities: intact motor and sensation bilateral UE and LE, no peripheral edema MSK: unable to assess gait/station, no clubbing/cyanosis of fingers/toes, normal ROM of all four extremities Skin: warm, dry, no rashes    Assessment/Plan: Fall  Frontal ICH - NSGY c/s, Dr. Conchita Paris, per EDP recs for no repeat CT, defer keppra given size of bleed and order SLP for cog eval Forehead hematoma - local wound care FEN - regular diet DVT - SCDs, hold chemical ppx due to bleeding concerns Dispo - 4NP    Diamantina Monks, MD General and Trauma Surgery [facility] Surgery

## 2023-11-25 NOTE — ED Triage Notes (Signed)
Pt to ED via EMS from home. Lives with family. Hospice pt. Mechanical fall- forehead contact with bathroom sink. -LOC -thinners +lac left forehead. A&O x 4. 170/98 86 99%  RA BG 119. DNR at bedside. Son at bedside.

## 2023-11-25 NOTE — ED Provider Triage Note (Signed)
Emergency Medicine Provider Triage Evaluation Note  [name] , a 83 y.o. male  was evaluated in triage.  Pt complains of fall at home. Pt on hospice, lives with son. Went to the bathroom and family heard a loud noise, got the door open and patient had fallen, striking his head partially on the bathroom sink/wall in front of him. Son says he was unable to get patient to respond for several minutes, but pt finally started waking up once EMS arrived.  Not on blood thinners. Son reports pt is very sleepy now, and that is not normal for him. Wears oxygen at home PRN.   Review of Systems  Positive: Head trauma, laceration to L forehead, LOC Negative:   Physical Exam  BP 128/61 (BP Location: Right Arm)   Pulse 74   Temp 98.6 F (37 C)   Resp 16   Ht 5\' 9"  (1.753 m)   Wt 60.3 kg   SpO2 95%   BMI 19.64 kg/m  Gen:   Awake, no distress   Resp:  Normal effort  MSK:   Moves extremities without difficulty  Other:  Lac on L forehead bandaged, no cervical midline tenderness, step offs or crepitus; chest wall stable, pelvis stable  Medical Decision Making  Medically screening exam initiated at 2:55 PM.  Appropriate orders placed.  [name] was informed that the remainder of the evaluation will be completed by another provider, this initial triage assessment does not replace that evaluation, and the importance of remaining in the ED until their evaluation is complete.  Imaging and basic labs ordered   Su Monks, PA-C 11/25/23 1459

## 2023-11-25 NOTE — ED Notes (Signed)
Pt is restless in bed, putting his feet on the wall and attempting to climb out of bed. This RN is attempting to orient pt.

## 2023-11-25 NOTE — ED Provider Notes (Signed)
[name]  CSN: [id number] Arrival date & time: 11/25/23 1435  Chief Complaint(s) Fall  HPI [name] is a 83 y.o. male here today after a fall in the bathroom.  Patient is on hospice for COPD.  Patient has some memory issues, but is able to perform most of his ADLs.  Patient's son is at bedside helps provide history.  Patient reportedly fell down the bathroom, son heard a noise.  Patient was able to recall the episode to EMS, but is unable to do so here.  It sounds as though he lost his balance.  Struck the left side of his head.  Not on any blood thinners.   Past Medical History Past Medical History:  Diagnosis Date   Asthma    as a child   Carotid arterial disease (HCC)    a. 11/2015 Carotid U/S: LICA 40-59%, RICA 100 CTO, nl subclavian arteries--f/u 1 yr.   Coronary atherosclerosis of native coronary artery    a. 09/2001 inflat MI/PCI: RCA 32m (3.0x18 AVE S7 BMS); b. 07/2006 Cath: LM nl, LAD 50p, LCX min irregs, OM1 large, min irregs, OM2 min irregs, RCA 20 ISR;  c. 10/2012 MV: EF 74%, no ischemia.   Essential hypertension    Heart murmur    since a child   Hyperlipidemia    Myocardial infarction [facility]) 2002   Pneumonia    age 43   Right inguinal hernia    Patient Active Problem List   Diagnosis Date Noted   ICH (intracerebral hemorrhage) (HCC) 11/25/2023   Syncope 05/16/2022   Syncope and collapse 05/15/2022   Failure to thrive in adult 05/15/2022   Anemia due to acquired thiamine deficiency 05/13/2022   Diabetes mellitus (HCC) 05/13/2022   Current severe episode of major depressive disorder without psychotic features without prior episode (HCC) 05/13/2022   Pressure injury of skin 05/01/2022   Complete heart block (HCC) 04/29/2022   Hematuria 04/28/2022   Protein-calorie malnutrition, severe (HCC) 04/28/2022   Depression 04/28/2022   Tobacco abuse 04/28/2022   Hypokalemia 10/04/2021   Bilateral lower  extremity edema 10/04/2021   Aortic atherosclerosis (HCC) 09/13/2021   Chronic hypoxemic respiratory failure (HCC) 08/16/2021   Carotid stenosis, asymptomatic, bilateral 12/08/2017   Demand ischemia of myocardium (HCC)    Essential hypertension    COPD III/ hypercarbic 10/07/2016   Chronic respiratory failure with hypercapnia (HCC) 10/07/2016   Pure hyperglyceridemia 09/04/2016   Moderate persistent asthma without complication 09/04/2016   Essential hypertension, benign 10/10/2009   Hyperlipidemia LDL goal <70 10/07/2008   CORONARY ATHEROSCLEROSIS NATIVE CORONARY ARTERY 10/07/2008   Cerebrovascular disease 10/07/2008   Home Medication(s) Prior to Admission medications   Medication Sig Start Date End Date Taking? Authorizing Provider  albuterol (VENTOLIN HFA) 108 (90 Base) MCG/ACT inhaler Inhale 2 puffs into the lungs every 6 (six) hours as needed for wheezing or shortness of breath. 10/04/21   Eden Emms, NP  aspirin EC 81 MG tablet Take 1 tablet (81 mg total) by mouth daily. 05/03/22   Tyrone Nine, MD  ezetimibe (ZETIA) 10 MG tablet Take 1 tablet (10 mg total) by mouth daily. 05/03/22   Tyrone Nine, MD  metoprolol succinate (TOPROL XL) 25 MG 24 hr tablet Take 1 tablet (25 mg total) by mouth at bedtime. 02/25/23   Lewayne Bunting, MD  mirtazapine (REMERON) 7.5 MG tablet Take 1 tablet (7.5 mg total) by mouth at bedtime. 05/13/22   Yetta Barre,  Bernadene Bell, MD  Multiple Vitamins-Minerals (ONE-A-DAY 50 PLUS PO) Take 1 tablet by mouth daily.    [provider]  rosuvastatin (CRESTOR) 40 MG tablet Take 1 tablet (40 mg total) by mouth daily. 05/03/22   Tyrone Nine, MD                                                                                                                                    Past Surgical History Past Surgical History:  Procedure Laterality Date   CARDIAC CATHETERIZATION     Carotid stents     COLONOSCOPY     FRACTURE SURGERY     HERNIA REPAIR     ventral    INGUINAL HERNIA REPAIR Right 11/01/2016   Procedure: RIGHT INGUINAL HERNIA REPAIR WITH MESH;  Surgeon: Avel Peace, MD;  Location: [facility] OR;  Service: General;  Laterality: Right;   INSERTION OF MESH Right 11/01/2016   Procedure: INSERTION OF MESH;  Surgeon: Avel Peace, MD;  Location: [facility] OR;  Service: General;  Laterality: Right;   ORIF SHOULDER FRACTURE Right    PACEMAKER IMPLANT N/A 05/02/2022   Procedure: PACEMAKER IMPLANT;  Surgeon: Lanier Prude, MD;  Location: [facility] INVASIVE CV LAB;  Service: Cardiovascular;  Laterality: N/A;   Family History Family History  Problem Relation Age of Onset   Hypertension Other     Social History Social History   Tobacco Use   Smoking status: Former    Current packs/day: 0.00    Average packs/day: 1 pack/day for 44.0 years (44.0 ttl pk-yrs)    Types: Cigarettes    Start date: 10/28/1954    Quit date: 10/28/1998    Years since quitting: 25.0   Smokeless tobacco: Former    Types: Chew   Tobacco comments:    quit chewing tobacco 2001 ish  Substance Use Topics   Alcohol use: No    Alcohol/week: 0.0 standard drinks of alcohol   Drug use: No   Allergies Penicillins  Review of Systems Review of Systems  Physical Exam Vital Signs  I have reviewed the triage vital signs BP (!) 163/91 (BP Location: Right Arm)   Pulse 93   Temp 98.6 F (37 C) (Oral)   Resp 18   Ht 5\' 9"  (1.753 m)   Wt 60.3 kg   SpO2 100%   BMI 19.64 kg/m   Physical Exam Vitals reviewed.  Constitutional:      Appearance: He is ill-appearing.  Eyes:     Pupils: Pupils are equal, round, and reactive to light.  Neck:     Comments: No midline spinous process tenderness.  I placed the patient in a cervical collar Cardiovascular:     Rate and Rhythm: Normal rate.  Pulmonary:     Effort: Pulmonary effort is normal.  Abdominal:     General: Abdomen is flat.     Palpations: Abdomen is soft.  Musculoskeletal:  General: Normal range of motion.  Skin:     General: Skin is warm.     Comments: 4 cm laceration over the left temple  Neurological:     Mental Status: He is alert. Mental status is at baseline.     Cranial Nerves: No cranial nerve deficit.     Motor: No weakness.     ED Results and Treatments Labs (all labs ordered are listed, but only abnormal results are displayed) Labs Reviewed  CBC - Abnormal; Notable for the following components:      Result Value   WBC 12.2 (*)    RBC 4.16 (*)    All other components within normal limits  BASIC METABOLIC PANEL - Abnormal; Notable for the following components:   Potassium 3.4 (*)    Glucose, Bld 162 (*)    GFR, Estimated 59 (*)    All other components within normal limits  CBC  BASIC METABOLIC PANEL  CBG MONITORING, ED                                                                                                                          Radiology CT Head Wo Contrast Result Date: 11/25/2023 CLINICAL DATA:  Head trauma, moderate-severe; Neck trauma (Age >= 65y). Fall with forehead strike. EXAM: CT HEAD WITHOUT CONTRAST CT CERVICAL SPINE WITHOUT CONTRAST TECHNIQUE: Multidetector CT imaging of the head and cervical spine was performed following the standard protocol without intravenous contrast. Multiplanar CT image reconstructions of the cervical spine were also generated. RADIATION DOSE REDUCTION: This exam was performed according to the departmental dose-optimization program which includes automated exposure control, adjustment of the mA and/or kV according to patient size and/or use of iterative reconstruction technique. COMPARISON:  CT head and cervical spine 05/15/2022. MRI head 04/27/2022. FINDINGS: CT HEAD FINDINGS Brain: There is a new 5 mm round hyperdense focus superficially in the right frontal lobe at the anterior aspect of the operculum, suspicious for a small acute hemorrhage (series 6, image 22 and series 5, image 52). There is no associated edema. No intracranial hemorrhage is  identified elsewhere. There is no evidence of an acute infarct, midline shift, or extra-axial fluid collection. Patchy cerebral white matter hypodensities are stable to minimally increased and are nonspecific but compatible with moderate chronic small vessel ischemic disease. There is moderate cerebral atrophy. Vascular: Calcified atherosclerosis at the skull base. No hyperdense vessel. Skull: No acute fracture or suspicious osseous lesion. Sinuses/Orbits: Completely opacified right maxillary sinus by hyperdense/inspissated material. Clear mastoid air cells. Unremarkable orbits. Other: Left lateral forehead hematoma. CT CERVICAL SPINE FINDINGS Alignment: Mild reversal of the normal cervical lordosis. Mild right convex curvature of the cervical spine. Trace anterolisthesis of C3 on C4, T1 on T2, and T2 on T3. Unchanged 5 mm anterolisthesis of C7 on T1. Skull base and vertebrae: No acute fracture or suspicious osseous lesion. Soft tissues and spinal canal: No prevertebral fluid or swelling. No visible canal hematoma. Disc levels: Similar appearance of  advanced disc degeneration from C5-6 through C7-T1 with milder disc degeneration elsewhere. Widespread advanced cervical facet arthrosis. Mild-to-moderate multilevel spinal stenosis and moderate to severe multilevel neural foraminal stenosis. Upper chest: Emphysema and scarring in the lung apices. Other: Mild atherosclerotic calcification at the carotid bifurcations. Critical Value/emergent results were called by telephone at the time of interpretation on 11/25/2023 at 4:32 pm to Dr. Andria Meuse, who verbally acknowledged these results. IMPRESSION: 1. New 5 mm hyperdense focus in the anterior right frontal lobe suspicious for a small acute hemorrhage/hemorrhagic contusion. No edema. 2. Left lateral forehead hematoma. 3. No acute cervical spine fracture. Electronically Signed   By: Sebastian Ache M.D.   On: 11/25/2023 16:32   CT Cervical Spine Wo Contrast Result Date:  11/25/2023 CLINICAL DATA:  Head trauma, moderate-severe; Neck trauma (Age >= 65y). Fall with forehead strike. EXAM: CT HEAD WITHOUT CONTRAST CT CERVICAL SPINE WITHOUT CONTRAST TECHNIQUE: Multidetector CT imaging of the head and cervical spine was performed following the standard protocol without intravenous contrast. Multiplanar CT image reconstructions of the cervical spine were also generated. RADIATION DOSE REDUCTION: This exam was performed according to the departmental dose-optimization program which includes automated exposure control, adjustment of the mA and/or kV according to patient size and/or use of iterative reconstruction technique. COMPARISON:  CT head and cervical spine 05/15/2022. MRI head 04/27/2022. FINDINGS: CT HEAD FINDINGS Brain: There is a new 5 mm round hyperdense focus superficially in the right frontal lobe at the anterior aspect of the operculum, suspicious for a small acute hemorrhage (series 6, image 22 and series 5, image 52). There is no associated edema. No intracranial hemorrhage is identified elsewhere. There is no evidence of an acute infarct, midline shift, or extra-axial fluid collection. Patchy cerebral white matter hypodensities are stable to minimally increased and are nonspecific but compatible with moderate chronic small vessel ischemic disease. There is moderate cerebral atrophy. Vascular: Calcified atherosclerosis at the skull base. No hyperdense vessel. Skull: No acute fracture or suspicious osseous lesion. Sinuses/Orbits: Completely opacified right maxillary sinus by hyperdense/inspissated material. Clear mastoid air cells. Unremarkable orbits. Other: Left lateral forehead hematoma. CT CERVICAL SPINE FINDINGS Alignment: Mild reversal of the normal cervical lordosis. Mild right convex curvature of the cervical spine. Trace anterolisthesis of C3 on C4, T1 on T2, and T2 on T3. Unchanged 5 mm anterolisthesis of C7 on T1. Skull base and vertebrae: No acute fracture or  suspicious osseous lesion. Soft tissues and spinal canal: No prevertebral fluid or swelling. No visible canal hematoma. Disc levels: Similar appearance of advanced disc degeneration from C5-6 through C7-T1 with milder disc degeneration elsewhere. Widespread advanced cervical facet arthrosis. Mild-to-moderate multilevel spinal stenosis and moderate to severe multilevel neural foraminal stenosis. Upper chest: Emphysema and scarring in the lung apices. Other: Mild atherosclerotic calcification at the carotid bifurcations. Critical Value/emergent results were called by telephone at the time of interpretation on 11/25/2023 at 4:32 pm to Dr. Andria Meuse, who verbally acknowledged these results. IMPRESSION: 1. New 5 mm hyperdense focus in the anterior right frontal lobe suspicious for a small acute hemorrhage/hemorrhagic contusion. No edema. 2. Left lateral forehead hematoma. 3. No acute cervical spine fracture. Electronically Signed   By: Sebastian Ache M.D.   On: 11/25/2023 16:32   DG Chest 1 View Result Date: 11/25/2023 CLINICAL DATA:  [id number] Fall [id number] EXAM: CHEST  1 VIEW COMPARISON:  05/15/2022 FINDINGS: Lungs are clear. No pneumothorax. Stable left subclavian pacemaker. Heart size and mediastinal contours are within normal limits. Aortic Atherosclerosis (ICD10-170.0). No effusion. Visualized bones  unremarkable. IMPRESSION: No acute cardiopulmonary disease. Electronically Signed   By: Corlis Leak M.D.   On: 11/25/2023 16:25   DG Pelvis 1-2 Views Result Date: 11/25/2023 CLINICAL DATA:  Fall.  Pain. EXAM: PELVIS - 1-2 VIEW COMPARISON:  Pelvic radiograph dated 08/15/2022. FINDINGS: There is no acute fracture or dislocation. There is moderate arthritic changes of the right hip. Degenerative changes of the lower lumbar spine. The soft tissues are unremarkable. IMPRESSION: 1. No acute fracture or dislocation. 2. Moderate arthritic changes of the right hip. Electronically Signed   By: Elgie Collard M.D.   On: 11/25/2023  16:24    Pertinent labs & imaging results that were available during my care of the patient were reviewed by me and considered in my medical decision making (see MDM for details).  Medications Ordered in ED Medications  acetaminophen (TYLENOL) tablet 1,000 mg (1,000 mg Oral Given 11/25/23 1929)  oxyCODONE (Oxy IR/ROXICODONE) immediate release tablet 2.5-5 mg (has no administration in time range)  morphine (PF) 2 MG/ML injection 2 mg (has no administration in time range)  docusate sodium (COLACE) capsule 100 mg (has no administration in time range)  polyethylene glycol (MIRALAX / GLYCOLAX) packet 17 g (has no administration in time range)  ondansetron (ZOFRAN-ODT) disintegrating tablet 4 mg (has no administration in time range)    Or  ondansetron (ZOFRAN) injection 4 mg (has no administration in time range)  metoprolol tartrate (LOPRESSOR) injection 5 mg (has no administration in time range)  hydrALAZINE (APRESOLINE) injection 10 mg (has no administration in time range)  methocarbamol (ROBAXIN) tablet 500 mg (500 mg Oral Given 11/25/23 1928)  Tdap (BOOSTRIX) injection 0.5 mL (has no administration in time range)  lidocaine-EPINEPHrine (XYLOCAINE W/EPI) 2 %-1:200000 (PF) injection (  Given by Other 11/25/23 1622)                                                                                                                                     Procedures .Critical Care  Performed by: Arletha Pili, DO Authorized by: Arletha Pili, DO   Critical care provider statement:    Critical care time (minutes):  30   Critical care was necessary to treat or prevent imminent or life-threatening deterioration of the following conditions:  Trauma   Critical care was time spent personally by me on the following activities:  Development of treatment plan with patient or surrogate, discussions with consultants, evaluation of patient's response to treatment, examination of patient, ordering and review  of laboratory studies, ordering and review of radiographic studies, ordering and performing treatments and interventions, pulse oximetry, re-evaluation of patient's condition and review of old charts .Laceration Repair  Date/Time: 11/25/2023 7:48 PM  Performed by: Arletha Pili, DO Authorized by: Arletha Pili, DO   Laceration details:    Length (cm):  4 Comments:     I anesthetized the area using 1% lidocaine with epinephrine.  Irrigated  the area with 30 cc of sterile saline.  Placed a figure 4 suture over pulsatile vessel with hemostasis achieved.  Applied gentle pressure to evacuate hematoma to the best of my ability.  Used 4-0 Prolene and placed 5 simple interrupted sutures with hemostasis achieved.  Toller procedure well.   (including critical care time)  Medical Decision Making / ED Course   This patient presents to the ED for concern of fall with head trauma, this involves an extensive number of treatment options, and is a complaint that carries with it a high risk of complications and morbidity.  The differential diagnosis includes intracranial hemorrhage, scalp laceration, concussion, cervical spine injury, nonsyncopal fall from standing.  Syncope.  MDM: Upon assessing the patient, activated him as a level 2 trauma.  Patient seemed a bit confused compared to his baseline per the son.  Is 60 years old, moderate size laceration to the left temple.  Placed patient in cervical collar, currently en route to CT.  No back pain, no chest pain, no abdominal tenderness.  No tenderness in the bilateral hips, lower extremities, hands wrists lower arms elbows or upper arms.  Reassessment 7:45 PM-after the patient returned from CT scanner, he began to have pulsatile bleeding from the left scalp.  I held pressure, had nursing staff grab me a suture kit, lidocaine and epinephrine.  Injected lidocaine with epinephrine into the area, and then placed a figure 4 suture which stopped the  bleeding.  Evacuated what I could have the hematoma that had formed in the area and closed the wound.  Patient received Tdap.  Question of a small intra cranial hemorrhage on head CT.  Spoke with on-call neurosurgeon Dr.Nundkumar who did not believe patient required any repeat imaging.  Spoke with trauma surgery who graciously accepted the patient for observation.  Patient confused, having difficulty with ambulation.  Patient's son states that this is quite a bit worse than the patient usually is.  Believe patient will benefit from admission.  Additional history obtained: -Additional history obtained from son at bedside -External records from outside source obtained and reviewed including: Chart review including previous notes, labs, imaging, consultation notes   Lab Tests: -I ordered, reviewed, and interpreted labs.   The pertinent results include:   Labs Reviewed  CBC - Abnormal; Notable for the following components:      Result Value   WBC 12.2 (*)    RBC 4.16 (*)    All other components within normal limits  BASIC METABOLIC PANEL - Abnormal; Notable for the following components:   Potassium 3.4 (*)    Glucose, Bld 162 (*)    GFR, Estimated 59 (*)    All other components within normal limits  CBC  BASIC METABOLIC PANEL  CBG MONITORING, ED      EKG PVCs, seen on prior EKG  EKG Interpretation Date/Time:  Tuesday November 25 2023 17:44:50 EST Ventricular Rate:  86 PR Interval:  160 QRS Duration:  122 QT Interval:  386 QTC Calculation: 461 R Axis:   -73  Text Interpretation: Sinus rhythm with occasional Premature ventricular complexes and Premature atrial complexes Left anterior fascicular block Minimal voltage criteria for LVH, may be normal variant ( Cornell product ) Septal infarct , age undetermined Abnormal ECG When compared with ECG of 15-May-2022 07:42, PREVIOUS ECG IS PRESENT Confirmed by Anders Simmonds [phone]) on 11/25/2023 7:47:39 PM         Imaging Studies  ordered: I ordered imaging studies including CT imaging of the  head I independently visualized and interpreted imaging. I agree with the radiologist interpretation   Medicines ordered and prescription drug management: Meds ordered this encounter  Medications   lidocaine-EPINEPHrine (XYLOCAINE W/EPI) 2 %-1:200000 (PF) injection    Perrin Maltese S: cabinet override   acetaminophen (TYLENOL) tablet 1,000 mg   oxyCODONE (Oxy IR/ROXICODONE) immediate release tablet 2.5-5 mg    Refill:  0   morphine (PF) 2 MG/ML injection 2 mg   docusate sodium (COLACE) capsule 100 mg   polyethylene glycol (MIRALAX / GLYCOLAX) packet 17 g   OR Linked Order Group    ondansetron (ZOFRAN-ODT) disintegrating tablet 4 mg    ondansetron (ZOFRAN) injection 4 mg   metoprolol tartrate (LOPRESSOR) injection 5 mg   hydrALAZINE (APRESOLINE) injection 10 mg   methocarbamol (ROBAXIN) tablet 500 mg   Tdap (BOOSTRIX) injection 0.5 mL    -I have reviewed the patients home medicines and have made adjustments as needed  Critical interventions Trauma, intracranial hemorrhage  Consultations Obtained: I requested consultation with the neurosurgery and trauma surgery,  and discussed lab and imaging findings as well as pertinent plan - they recommend: Observation admission   Cardiac Monitoring: The patient was maintained on a cardiac monitor.  I personally viewed and interpreted the cardiac monitored which showed an underlying rhythm of: Sinus rhythm    Reevaluation: After the interventions noted above, I reevaluated the patient and found that they have :improved  Co morbidities that complicate the patient evaluation  Past Medical History:  Diagnosis Date   Asthma    as a child   Carotid arterial disease (HCC)    a. 11/2015 Carotid U/S: LICA 40-59%, RICA 100 CTO, nl subclavian arteries--f/u 1 yr.   Coronary atherosclerosis of native coronary artery    a. 09/2001 inflat MI/PCI: RCA 76m (3.0x18 AVE S7 BMS); b.  07/2006 Cath: LM nl, LAD 50p, LCX min irregs, OM1 large, min irregs, OM2 min irregs, RCA 20 ISR;  c. 10/2012 MV: EF 74%, no ischemia.   Essential hypertension    Heart murmur    since a child   Hyperlipidemia    Myocardial infarction [facility]) 2002   Pneumonia    age 3   Right inguinal hernia       Dispostion: Trauma surgery service admission     Final Clinical Impression(s) / ED Diagnoses Final diagnoses:  Fall, initial encounter  Intracranial hematoma, with unknown loss of consciousness status, initial encounter [facility])     @PCDICTATION @    Anders Simmonds T, DO 11/25/23 1950

## 2023-11-25 NOTE — Progress Notes (Signed)
Orthopedic Tech Progress Note Patient Details:  [name] October 18, 1941 [id number]  LEVEL 2 TRAUMA, Patient has on collar   Patient ID: [name], male   DOB: 12-12-1940, 83 y.o.   MRN: [id number]  [name] 11/25/2023, 3:41 PM

## 2023-11-26 DIAGNOSIS — S06340A Traumatic hemorrhage of right cerebrum without loss of consciousness, initial encounter: Secondary | ICD-10-CM | POA: Diagnosis not present

## 2023-11-26 LAB — BASIC METABOLIC PANEL WITH GFR
Anion gap: 11 (ref 5–15)
BUN: 15 mg/dL (ref 8–23)
CO2: 29 mmol/L (ref 22–32)
Calcium: 9.2 mg/dL (ref 8.9–10.3)
Chloride: 97 mmol/L — ABNORMAL LOW (ref 98–111)
Creatinine, Ser: 1.12 mg/dL (ref 0.61–1.24)
GFR, Estimated: >60 mL/min (ref >60)
Glucose, Bld: 104 mg/dL — ABNORMAL HIGH (ref 70–99)
Potassium: 3 mmol/L — ABNORMAL LOW (ref 3.5–5.1)
Sodium: 137 mmol/L (ref 135–145)

## 2023-11-26 LAB — CBC
HCT: 36.1 % — ABNORMAL LOW (ref 39.0–52.0)
Hemoglobin: 12 g/dL — ABNORMAL LOW (ref 13.0–17.0)
MCH: 30.9 pg (ref 26.0–34.0)
MCHC: 33.2 g/dL (ref 30.0–36.0)
MCV: 93 fL (ref 80.0–100.0)
Platelets: 246 10*3/uL (ref 150–400)
RBC: 3.88 MIL/uL — ABNORMAL LOW (ref 4.22–5.81)
RDW: 13.5 % (ref 11.5–15.5)
WBC: 9 10*3/uL (ref 4.0–10.5)
nRBC: 0 % (ref 0.0–0.2)

## 2023-11-26 MED ORDER — METHOCARBAMOL 500 MG PO TABS
500.0000 mg | ORAL_TABLET | Freq: Three times a day (TID) | ORAL | Status: AC | PRN
  Administered 2023-11-27 – 2023-11-30 (×6): 500 mg via ORAL
  Filled 2023-11-26 (×6): qty 1

## 2023-11-26 NOTE — Evaluation (Signed)
Occupational Therapy Evaluation Patient Details Name: [name] MRN: [id number] DOB: 1940/12/25 Today's Date: 11/26/2023   History of Present Illness Patient is a 83 yo male presenting to the ED with fall and hitting head on 11/25/23. Patient did lose consciousness. CT head finding R frontal lobe small acute hemorrhage/hemorrhagic contusion, CT of spine clear. PMH includes: CAD, HTN, HLD, DM, on hospice care for COPD.   Clinical Impression   Prior to this admission, patient living with his wife and son (patient is on hospice care for COPD). Patient states he typically uses a walker and his son helped with showers however patient is pleasantly confused therefore question accuracy of home set up. Currently, patient is presenting with confusion, blurred vision, eye fatigue, and need for min-mod A for ADL management. Patient is min A for functional ambulation with RW. OT will follow acutely, but recommending return home with hospice when able.      If plan is discharge home, recommend the following: A little help with walking and/or transfers;A lot of help with bathing/dressing/bathroom;Assist for transportation;Supervision due to cognitive status;Help with stairs or ramp for entrance;Assistance with cooking/housework;Direct supervision/assist for financial management;Direct supervision/assist for medications management;Assistance with feeding    Functional Status Assessment  Patient has had a recent decline in their functional status and demonstrates the ability to make significant improvements in function in a reasonable and predictable amount of time.  Equipment Recommendations  None recommended by OT    Recommendations for Other Services       Precautions / Restrictions Precautions Precautions: Fall Restrictions Weight Bearing Restrictions Per Provider Order: No      Mobility Bed Mobility Overal bed mobility: Needs Assistance Bed Mobility: Supine to Sit     Supine to sit: Min  assist     General bed mobility comments: struggling to scoot to EOB so assisted using bed pad, able to sit up on his own    Transfers Overall transfer level: Needs assistance Equipment used: Rolling walker (2 wheels) Transfers: Sit to/from Stand Sit to Stand: Mod assist, +2 safety/equipment           General transfer comment: posterior bias initially, increased time to come forward      Balance Overall balance assessment: Needs assistance   Sitting balance-Leahy Scale: Good     Standing balance support: Bilateral upper extremity supported, Reliant on assistive device for balance, During functional activity Standing balance-Leahy Scale: Poor Standing balance comment: mod A initially with walker due to posterior lean                           ADL either performed or assessed with clinical judgement   ADL Overall ADL's : Needs assistance/impaired Eating/Feeding: Set up;Sitting   Grooming: Wash/dry hands;Wash/dry face;Minimal assistance;Sitting Grooming Details (indicate cue type and reason): cleaning blood off his face Upper Body Bathing: Minimal assistance;Sitting   Lower Body Bathing: Moderate assistance;Sit to/from stand;Sitting/lateral leans Lower Body Bathing Details (indicate cue type and reason): for safety Upper Body Dressing : Minimal assistance;Sitting   Lower Body Dressing: Moderate assistance;Sitting/lateral leans;Sit to/from stand Lower Body Dressing Details (indicate cue type and reason): for safety Toilet Transfer: Minimal assistance;Cueing for sequencing;Cueing for safety;Ambulation;Rolling walker (2 wheels) Toilet Transfer Details (indicate cue type and reason): simulated with ambulation in room Toileting- Clothing Manipulation and Hygiene: Moderate assistance;Sit to/from stand;Sitting/lateral lean Toileting - Clothing Manipulation Details (indicate cue type and reason): for thoroughness     Functional mobility during ADLs: Minimal  assistance;Cueing for safety;Cueing for sequencing;Rolling walker (2 wheels) General ADL Comments: Prior to this admission, patient living with his wife and son (patient is on hospice care for COPD). Patient states he typically uses a walker and his son helped with showers however patient is pleasantly confused therefore question accuracy of home set up. Currently, patient is presenting with confusion, blurred vision, eye fatigue, and need for min-mod A for ADL management. Patient is min A for functional ambulation with RW. OT will follow acutely, but recommending return home with hospice when able.     Vision Baseline Vision/History: 1 Wears glasses Ability to See in Adequate Light: 0 Adequate Patient Visual Report: Blurring of vision;Eye fatigue/eye pain/headache Vision Assessment?: Vision impaired- to be further tested in functional context;Wears glasses for reading;Yes Eye Alignment: Within Functional Limits Ocular Range of Motion: Within Functional Limits Alignment/Gaze Preference: Head tilt Tracking/Visual Pursuits: Impaired - to be further tested in functional context Saccades: Impaired - to be further tested in functional context Convergence: Impaired - to be further tested in functional context Additional Comments: Initially denied blurred vision, then stating blurred vision and fatigue when asked in a different context, significant difficulty with visual assessment     Perception Perception: Not tested       Praxis Praxis: Not tested       Pertinent Vitals/Pain Pain Assessment Pain Assessment: No/denies pain     Extremity/Trunk Assessment Upper Extremity Assessment Upper Extremity Assessment: Generalized weakness;Right hand dominant   Lower Extremity Assessment Lower Extremity Assessment: Defer to PT evaluation   Cervical / Trunk Assessment Cervical / Trunk Assessment: Kyphotic   Communication Communication Communication: Hearing impairment   Cognition Arousal:  Alert Behavior During Therapy: WFL for tasks assessed/performed Overall Cognitive Status: No family/caregiver present to determine baseline cognitive functioning Area of Impairment: Orientation, Memory, Attention, Problem solving                 Orientation Level: Disoriented to, Place, Time, Situation Current Attention Level: Sustained Memory: Decreased short-term memory       Problem Solving: Slow processing, Requires verbal cues, Requires tactile cues General Comments: Thought he hit his head last week, states his enjoys "playing football" and easily distracted, figeting with cords, busy mat provided     General Comments  dried blood over L side of head and face/ear, assisted for removing some and continued education throughout on injuries with pt; VSS and no c/o dizziness or nausea throughout session    Exercises     Shoulder Instructions      Home Living Family/patient expects to be discharged to:: Private residence Living Arrangements: Spouse/significant other;Children Available Help at Discharge: Family Type of Home: House Home Access: Stairs to enter Secretary/administrator of Steps: 1   Home Layout: One level     Bathroom Shower/Tub: Chief Strategy Officer: Standard     Home Equipment: Agricultural consultant (2 wheels);Grab bars - tub/shower;Shower seat;Wheelchair - manual          Prior Functioning/Environment Prior Level of Function : Needs assist             Mobility Comments: states walked with his wife support ADLs Comments: states son helps with showers        OT Problem List: Decreased strength;Decreased range of motion;Decreased activity tolerance;Impaired balance (sitting and/or standing);Impaired vision/perception;Decreased coordination;Decreased cognition;Decreased safety awareness;Decreased knowledge of use of DME or AE;Decreased knowledge of precautions      OT Treatment/Interventions: Therapeutic exercise;Self-care/ADL  training;Energy conservation;DME and/or AE instruction;Therapeutic activities;Patient/family  education;Balance training;Cognitive remediation/compensation;Visual/perceptual remediation/compensation    OT Goals(Current goals can be found in the care plan section) Acute Rehab OT Goals Patient Stated Goal: unable to state OT Goal Formulation: With patient Time For Goal Achievement: 12/10/23 Potential to Achieve Goals: Good  OT Frequency: Min 1X/week    Co-evaluation   Reason for Co-Treatment: Necessary to address cognition/behavior during functional activity PT goals addressed during session: Mobility/safety with mobility;Balance;Proper use of DME        AM-PAC OT "6 Clicks" Daily Activity     Outcome Measure Help from another person eating meals?: A Little Help from another person taking care of personal grooming?: A Little Help from another person toileting, which includes using toliet, bedpan, or urinal?: A Lot Help from another person bathing (including washing, rinsing, drying)?: A Lot Help from another person to put on and taking off regular upper body clothing?: A Little Help from another person to put on and taking off regular lower body clothing?: A Lot 6 Click Score: 15   End of Session Equipment Utilized During Treatment: Gait belt;Rolling walker (2 wheels) Nurse Communication: Mobility status  Activity Tolerance: Patient tolerated treatment well Patient left: in chair;with call bell/phone within reach;with chair alarm set  OT Visit Diagnosis: Unsteadiness on feet (R26.81);Other abnormalities of gait and mobility (R26.89);Repeated falls (R29.6);Muscle weakness (generalized) (M62.81);History of falling (Z91.81);Other symptoms and signs involving cognitive function                Time: 2458-0998 OT Time Calculation (min): 19 min Charges:  OT General Charges $OT Visit: 1 Visit OT Evaluation $OT Eval Moderate Complexity: 1 Mod  Pollyann Glen E. Zoye Chandra, OTR/L Acute  Rehabilitation Services [phone]   Cherlyn Cushing 11/26/2023, 12:40 PM

## 2023-11-26 NOTE — Care Management Obs Status (Addendum)
MEDICARE OBSERVATION STATUS NOTIFICATION   Patient Details  Name: [name] MRN: [id number] Date of Birth: 1941/07/30   Medicare Observation Status Notification Given:  Yes Copy placed at bedside   Darrold Span, RN 11/26/2023, 3:36 PM

## 2023-11-26 NOTE — Progress Notes (Signed)
Patient refused to have labs drawn by nurse or phlebotomist

## 2023-11-26 NOTE — Progress Notes (Signed)
Transition of Care [facility]) - CAGE-AID Screening   Patient Details  Name: [name] MRN: [id number] Date of Birth: 02/27/1941  [facility] CM/SW Contact:    Katha Hamming, RN Phone Number: 11/26/2023, 1:15 AM    CAGE-AID Screening:    Have You Ever Felt You Ought to Cut Down on Your Drinking or Drug Use?: No Have People Annoyed You By Critizing Your Drinking Or Drug Use?: No Have You Felt Bad Or Guilty About Your Drinking Or Drug Use?: No Have You Ever Had a Drink or Used Drugs First Thing In The Morning to Steady Your Nerves or to Get Rid of a Hangover?: No CAGE-AID Score: 0  Substance Abuse Education Offered: No (denies drug, alcohol use, no resources indicated)

## 2023-11-26 NOTE — Evaluation (Signed)
Speech Language Pathology Evaluation Patient Details Name: [name] MRN: [id number] DOB: 1941-06-12 Today's Date: 11/26/2023 Time: 1425-1455 SLP Time Calculation (min) (ACUTE ONLY): 30 min  Problem List:  Patient Active Problem List   Diagnosis Date Noted   ICH (intracerebral hemorrhage) (HCC) 11/25/2023   Syncope 05/16/2022   Syncope and collapse 05/15/2022   Failure to thrive in adult 05/15/2022   Anemia due to acquired thiamine deficiency 05/13/2022   Diabetes mellitus (HCC) 05/13/2022   Current severe episode of major depressive disorder without psychotic features without prior episode (HCC) 05/13/2022   Pressure injury of skin 05/01/2022   Complete heart block (HCC) 04/29/2022   Hematuria 04/28/2022   Protein-calorie malnutrition, severe (HCC) 04/28/2022   Depression 04/28/2022   Tobacco abuse 04/28/2022   Hypokalemia 10/04/2021   Bilateral lower extremity edema 10/04/2021   Aortic atherosclerosis (HCC) 09/13/2021   Chronic hypoxemic respiratory failure (HCC) 08/16/2021   Carotid stenosis, asymptomatic, bilateral 12/08/2017   Demand ischemia of myocardium (HCC)    Essential hypertension    COPD III/ hypercarbic 10/07/2016   Chronic respiratory failure with hypercapnia (HCC) 10/07/2016   Pure hyperglyceridemia 09/04/2016   Moderate persistent asthma without complication 09/04/2016   Essential hypertension, benign 10/10/2009   Hyperlipidemia LDL goal <70 10/07/2008   CORONARY ATHEROSCLEROSIS NATIVE CORONARY ARTERY 10/07/2008   Cerebrovascular disease 10/07/2008   Past Medical History:  Past Medical History:  Diagnosis Date   Asthma    as a child   Carotid arterial disease (HCC)    a. 11/2015 Carotid U/S: LICA 40-59%, RICA 100 CTO, nl subclavian arteries--f/u 1 yr.   Coronary atherosclerosis of native coronary artery    a. 09/2001 inflat MI/PCI: RCA 78m (3.0x[address] A[address] S7 BMS); b. 07/2006 Cath: LM nl, LAD 50p, LCX min irregs, OM1 large, min irregs, OM2 min irregs, RCA  20 ISR;  c. 10/2012 MV: EF 74%, no ischemia.   Essential hypertension    Heart murmur    since a child   Hyperlipidemia    Myocardial infarction [facility]) 2002   Pneumonia    age 46   Right inguinal hernia    Past Surgical History:  Past Surgical History:  Procedure Laterality Date   CARDIAC CATHETERIZATION     Carotid stents     COLONOSCOPY     FRACTURE SURGERY     HERNIA REPAIR     ventral   INGUINAL HERNIA REPAIR Right 11/01/2016   Procedure: RIGHT INGUINAL HERNIA REPAIR WITH MESH;  Surgeon: Avel Peace, MD;  Location: [facility] OR;  Service: General;  Laterality: Right;   INSERTION OF MESH Right 11/01/2016   Procedure: INSERTION OF MESH;  Surgeon: Avel Peace, MD;  Location: [facility] OR;  Service: General;  Laterality: Right;   ORIF SHOULDER FRACTURE Right    PACEMAKER IMPLANT N/A 05/02/2022   Procedure: PACEMAKER IMPLANT;  Surgeon: Lanier Prude, MD;  Location: [facility] INVASIVE CV LAB;  Service: Cardiovascular;  Laterality: N/A;   HPI:  Pt is a 83 y.o. male admitted on 01/28 after a fall in the bathroom where he struck left side of head. Patient is on hospice for COPD. Patient reportedly fell down the bathroom and son heard a noise. Patient was able to recall the episode to EMS, but was unable to do so in hospital. CT head finding R frontal lob small acute hemorrhage/hemorrhagic contusion. PMHx includes: CAD, HTN, HLD, and DM.   Assessment / Plan / Recommendation Clinical Impression  Pt was seen to evaluate cognition to develop further plans  for treatment. SLUMS eval was administered with pt scoring 2/30. Pt presents with a severe cognitive-communication deficit, across multiple domains including memory, attention, awareness, and executive functioning. Pt was distracted throughout session by medical cables, contributing to attentional issues. Max multimodal cueing was required to reengage pt during each task due to attention and memory deficits. Pt was oriented to person and named 5 animals  during divergent naming task, displaying minimally intact sustained attention for task. Pt exhibited strength in social language, participating consistently and cooperating with each task. Pt had a pleasant mood and was willing to engage with SLP for entire session with no behavioral difficulties.  Recommendation is further f/u with SLP to implement functional cognitive-communication strategies to optimize pt's QOL.     SLP Assessment  SLP Recommendation/Assessment: Patient needs continued Speech Lanaguage Pathology Services SLP Visit Diagnosis: Cognitive communication deficit (R41.841)    Recommendations for follow up therapy are one component of a multi-disciplinary discharge planning process, led by the attending physician.  Recommendations may be updated based on patient status, additional functional criteria and insurance authorization.    Follow Up Recommendations  Follow physician's recommendations for discharge plan and follow up therapies    Assistance Recommended at Discharge     Functional Status Assessment    Frequency and Duration min 1 x/week  2 weeks      SLP Evaluation Cognition  Overall Cognitive Status: No family/caregiver present to determine baseline cognitive functioning Arousal/Alertness: Awake/alert Orientation Level: Oriented to person Year: Other (Comment) (1942) Day of Week: Correct Attention: Sustained;Focused;Selective Focused Attention: Impaired Focused Attention Impairment: Verbal basic;Functional basic Sustained Attention: Impaired Sustained Attention Impairment: Verbal basic;Functional basic Selective Attention: Impaired Selective Attention Impairment: Verbal basic;Functional basic Memory: Impaired Memory Impairment: Decreased recall of new information;Retrieval deficit;Storage deficit Awareness: Impaired Awareness Impairment: Intellectual impairment Problem Solving: Impaired Problem Solving Impairment: Functional basic;Verbal basic Executive  Function: Reasoning;Sequencing;Self Monitoring Reasoning: Impaired Reasoning Impairment: Verbal basic;Functional basic Sequencing: Impaired Sequencing Impairment: Verbal basic;Functional basic Self Monitoring: Impaired Self Monitoring Impairment: Functional basic;Verbal basic Behaviors: Impulsive;Restless Safety/Judgment: Impaired       Comprehension  Auditory Comprehension Overall Auditory Comprehension: Impaired Interfering Components: Attention;Working memory;Processing speed EffectiveTechniques: Repetition;Slowed speech;Visual/Gestural cues;Extra processing time Visual Recognition/Discrimination Discrimination: Not tested Reading Comprehension Reading Status: Not tested    Expression Expression Primary Mode of Expression: Verbal Verbal Expression Overall Verbal Expression: Appears within functional limits for tasks assessed   Oral / Motor  Oral Motor/Sensory Function Overall Oral Motor/Sensory Function: Within functional limits Motor Speech Overall Motor Speech: Appears within functional limits for tasks assessed            Rowe Robert 11/26/2023, 3:39 PM

## 2023-11-26 NOTE — Evaluation (Signed)
Physical Therapy Evaluation Patient Details Name: [name] MRN: [id number] DOB: 07/14/1941 Today's Date: 11/26/2023  History of Present Illness  Patient is a 83 yo male presenting to the ED with fall and hitting head on 11/25/23. Patient did lose consciousness. CT head finding R frontal lob small acute hemorrhage/hemorrhagic contusion, CT of spine clear. PMH includes: CAD, HTN, HLD, DM, on hospice care for COPD.  Clinical Impression  Patient presents with decreased mobility due to decreased balance, decreased cognition and safety awareness (though unknown baseline).  Patient previously ambulatory with RW and family support and completing BADL's with assist.  Patient currently needing mod A for balance especially with initial sit to stand due to posterior bias.  Ambulating in the room with RW and A of 1 and balance on EOB unaided.  Feel he could potentially return home with continued in home services.  Would recommend HHPT though on Hospice may not get, though with unknown baseline could be close to baseline.  PT will continue to follow in the acute setting.          If plan is discharge home, recommend the following: A little help with walking and/or transfers;A lot of help with bathing/dressing/bathroom;Assistance with cooking/housework;Help with stairs or ramp for entrance;Assist for transportation   Can travel by private vehicle        Equipment Recommendations None recommended by PT  Recommendations for Other Services       Functional Status Assessment Patient has had a recent decline in their functional status and demonstrates the ability to make significant improvements in function in a reasonable and predictable amount of time.     Precautions / Restrictions Precautions Precautions: Fall      Mobility  Bed Mobility Overal bed mobility: Needs Assistance Bed Mobility: Supine to Sit     Supine to sit: Min assist     General bed mobility comments: struggling to scoot to  EOB so assisted using bed pad, able to sit up on his own    Transfers Overall transfer level: Needs assistance Equipment used: Rolling walker (2 wheels) Transfers: Sit to/from Stand Sit to Stand: Mod assist, +2 safety/equipment           General transfer comment: posterior bias initially, increased time to come forward    Ambulation/Gait Ambulation/Gait assistance: Min assist, Mod assist Gait Distance (Feet): 20 Feet Assistive device: Rolling walker (2 wheels) Gait Pattern/deviations: Step-through pattern, Decreased stride length, Wide base of support, Leaning posteriorly       General Gait Details: less posterior bias with ambulation, though some LOB turning around in room with RW and increased assistance for safety  Stairs            Wheelchair Mobility     Tilt Bed    Modified Rankin (Stroke Patients Only)       Balance Overall balance assessment: Needs assistance   Sitting balance-Leahy Scale: Good     Standing balance support: Bilateral upper extremity supported, Reliant on assistive device for balance, During functional activity Standing balance-Leahy Scale: Poor Standing balance comment: mod A initially with walker due to posterior lean                             Pertinent Vitals/Pain Pain Assessment Pain Assessment: No/denies pain    Home Living Family/patient expects to be discharged to:: Private residence Living Arrangements: Spouse/significant other;Children Available Help at Discharge: Family Type of Home: House Home Access: Stairs  to enter   Entrance Stairs-Number of Steps: 1   Home Layout: One level Home Equipment: Rolling Walker (2 wheels);Grab bars - tub/shower;Shower seat;Wheelchair - manual      Prior Function Prior Level of Function : Needs assist             Mobility Comments: states walked with his wife support ADLs Comments: states son helps with showers     Extremity/Trunk Assessment   Upper  Extremity Assessment Upper Extremity Assessment: Defer to OT evaluation    Lower Extremity Assessment Lower Extremity Assessment: Overall WFL for tasks assessed    Cervical / Trunk Assessment Cervical / Trunk Assessment: Kyphotic  Communication   Communication Communication: Hearing impairment  Cognition Arousal: Alert Behavior During Therapy: WFL for tasks assessed/performed Overall Cognitive Status: No family/caregiver present to determine baseline cognitive functioning Area of Impairment: Orientation, Memory, Attention, Problem solving                 Orientation Level: Disoriented to, Place, Time, Situation Current Attention Level: Sustained Memory: Decreased short-term memory       Problem Solving: Slow processing, Requires verbal cues, Requires tactile cues          General Comments General comments (skin integrity, edema, etc.): dried blood over L side of head and face/ear, assisted for removing some and continued education throughout on injuries with pt; VSS and no c/o dizziness or nausea throughout session    Exercises     Assessment/Plan    PT Assessment Patient needs continued PT services  PT Problem List Decreased cognition;Decreased knowledge of use of DME;Decreased activity tolerance;Decreased balance;Decreased mobility;Impaired sensation;Decreased knowledge of precautions;Decreased safety awareness       PT Treatment Interventions DME instruction;Gait training;Stair training;Patient/family education;Functional mobility training;Therapeutic activities;Therapeutic exercise;Balance training    PT Goals (Current goals can be found in the Care Plan section)  Acute Rehab PT Goals Patient Stated Goal: return home PT Goal Formulation: With patient Time For Goal Achievement: 12/10/23 Potential to Achieve Goals: Fair    Frequency Min 1X/week     Co-evaluation PT/OT/SLP Co-Evaluation/Treatment: Yes Reason for Co-Treatment: Necessary to address  cognition/behavior during functional activity PT goals addressed during session: Mobility/safety with mobility;Balance;Proper use of DME         AM-PAC PT "6 Clicks" Mobility  Outcome Measure Help needed turning from your back to your side while in a flat bed without using bedrails?: A Little Help needed moving from lying on your back to sitting on the side of a flat bed without using bedrails?: A Little Help needed moving to and from a bed to a chair (including a wheelchair)?: A Lot Help needed standing up from a chair using your arms (e.g., wheelchair or bedside chair)?: A Lot Help needed to walk in hospital room?: A Lot Help needed climbing 3-5 steps with a railing? : Total 6 Click Score: 13    End of Session Equipment Utilized During Treatment: Gait belt Activity Tolerance: Patient tolerated treatment well Patient left: in chair;with call bell/phone within reach;with chair alarm set Nurse Communication: Mobility status PT Visit Diagnosis: Other abnormalities of gait and mobility (R26.89);Other symptoms and signs involving the nervous system (R29.898)    Time: 1610-9604 PT Time Calculation (min) (ACUTE ONLY): 19 min   Charges:   PT Evaluation $PT Eval Moderate Complexity: 1 Mod   PT General Charges $$ ACUTE PT VISIT: 1 Visit         Sheran Lawless, PT Acute Rehabilitation Services [phone] 11/26/2023   Aram Beecham  Kaleeah Gingerich 11/26/2023, 11:21 AM

## 2023-11-26 NOTE — Plan of Care (Signed)

## 2023-11-27 DIAGNOSIS — S06340A Traumatic hemorrhage of right cerebrum without loss of consciousness, initial encounter: Secondary | ICD-10-CM | POA: Diagnosis not present

## 2023-11-27 LAB — CBC
HCT: 38.1 % — ABNORMAL LOW (ref 39.0–52.0)
Hemoglobin: 12.9 g/dL — ABNORMAL LOW (ref 13.0–17.0)
MCH: 31.5 pg (ref 26.0–34.0)
MCHC: 33.9 g/dL (ref 30.0–36.0)
MCV: 93.2 fL (ref 80.0–100.0)
Platelets: 286 10*3/uL (ref 150–400)
RBC: 4.09 MIL/uL — ABNORMAL LOW (ref 4.22–5.81)
RDW: 13.5 % (ref 11.5–15.5)
WBC: 9.1 10*3/uL (ref 4.0–10.5)
nRBC: 0 % (ref 0.0–0.2)

## 2023-11-27 LAB — URINALYSIS, W/ REFLEX TO CULTURE (INFECTION SUSPECTED)
Bilirubin Urine: NEGATIVE
Glucose, UA: NEGATIVE mg/dL
Ketones, ur: 5 mg/dL — AB
Leukocytes,Ua: NEGATIVE
Nitrite: NEGATIVE
Protein, ur: 100 mg/dL — AB
RBC / HPF: >50 RBC/hpf (ref 0–5)
Specific Gravity, Urine: 1.015 (ref 1.005–1.030)
pH: 7 (ref 5.0–8.0)

## 2023-11-27 LAB — BASIC METABOLIC PANEL
Anion gap: 11 (ref 5–15)
BUN: 17 mg/dL (ref 8–23)
CO2: 28 mmol/L (ref 22–32)
Calcium: 9.5 mg/dL (ref 8.9–10.3)
Chloride: 98 mmol/L (ref 98–111)
Creatinine, Ser: 1.21 mg/dL (ref 0.61–1.24)
GFR, Estimated: 59 mL/min — ABNORMAL LOW (ref >60)
Glucose, Bld: 95 mg/dL (ref 70–99)
Potassium: 3 mmol/L — ABNORMAL LOW (ref 3.5–5.1)
Sodium: 137 mmol/L (ref 135–145)

## 2023-11-27 MED ORDER — POTASSIUM CHLORIDE CRYS ER 20 MEQ PO TBCR
40.0000 meq | EXTENDED_RELEASE_TABLET | Freq: Two times a day (BID) | ORAL | Status: AC
  Administered 2023-11-27 (×2): 40 meq via ORAL
  Filled 2023-11-27 (×2): qty 2

## 2023-11-27 NOTE — Progress Notes (Addendum)
[facility] Progress Note     Subjective: CC:  Patient denies pain. Oriented to self, hospital, not year. Does not recall falling or hitting his head. States that he lives with his wife, daughter, and son. He tells me he is unsure if he has hospice at home. He has a posey belt in place - reportedly he was agitated overnight.  Just mobilized with a walker.   Objective: Vital signs in last 24 hours: Temp:  [98 F (36.7 C)-99.1 F (37.3 C)] 98.5 F (36.9 C) (01/30 0800) Pulse Rate:  [79-107] 79 (01/30 0800) Resp:  [15-20] 15 (01/30 0800) BP: (122-150)/(67-79) 122/67 (01/30 0800) SpO2:  [92 %-100 %] 93 % (01/30 0800) Last BM Date :  (PTA)  Intake/Output from previous day: 01/29 0701 - 01/30 0700 In: -  Out: 400 [Urine:400] Intake/Output this shift: No intake/output data recorded.  PE: Gen:  Alert, NAD, pleasant HEENT: hematoma over left frontal region, laceration s/p repair.  Card:  Regular rate and rhythm, pedal pulses 2+ BL Pulm:  Normal effort ORA  Abd: Soft, non-tender, non-distended,  Skin: warm and dry, no rashes  Psych: A&Ox2  Lab Results:  Recent Labs    11/25/23 1622 11/26/23 0827  WBC 12.2* 9.0  HGB 13.0 12.0*  HCT 39.1 36.1*  PLT 268 246   BMET Recent Labs    11/25/23 1622 11/26/23 0827  NA 138 137  K 3.4* 3.0*  CL 98 97*  CO2 25 29  GLUCOSE 162* 104*  BUN 19 15  CREATININE 1.21 1.12  CALCIUM 9.5 9.2   PT/INR No results for input(s): "LABPROT", "INR" in the last 72 hours. CMP     Component Value Date/Time   NA 137 11/26/2023 0827   NA 142 04/26/2020 1417   K 3.0 (L) 11/26/2023 0827   CL 97 (L) 11/26/2023 0827   CO2 29 11/26/2023 0827   GLUCOSE 104 (H) 11/26/2023 0827   BUN 15 11/26/2023 0827   BUN 19 04/26/2020 1417   CREATININE 1.12 11/26/2023 0827   CREATININE 0.96 11/27/2015 1118   CALCIUM 9.2 11/26/2023 0827   PROT 6.1 (L) 05/15/2022 0742   PROT 7.4 07/20/2020 0824   ALBUMIN 2.8 (L) 05/15/2022 0742   ALBUMIN 4.9  (H) 07/20/2020 0824   AST 28 05/15/2022 0742   ALT 20 05/15/2022 0742   ALKPHOS 72 05/15/2022 0742   BILITOT 0.6 05/15/2022 0742   BILITOT 0.5 07/20/2020 0824   GFRNONAA >60 11/26/2023 0827   GFRNONAA 72 11/30/2014 0840   GFRAA 57 (L) 06/04/2020 1617   GFRAA 83 11/30/2014 0840   Lipase     Component Value Date/Time   LIPASE 29 06/04/2020 1617       Studies/Results: CT Head Wo Contrast Result Date: 11/25/2023 CLINICAL DATA:  Head trauma, moderate-severe; Neck trauma (Age >= 65y). Fall with forehead strike. EXAM: CT HEAD WITHOUT CONTRAST CT CERVICAL SPINE WITHOUT CONTRAST TECHNIQUE: Multidetector CT imaging of the head and cervical spine was performed following the standard protocol without intravenous contrast. Multiplanar CT image reconstructions of the cervical spine were also generated. RADIATION DOSE REDUCTION: This exam was performed according to the departmental dose-optimization program which includes automated exposure control, adjustment of the mA and/or kV according to patient size and/or use of iterative reconstruction technique. COMPARISON:  CT head and cervical spine 05/15/2022. MRI head 04/27/2022. FINDINGS: CT HEAD FINDINGS Brain: There is a new 5 mm round hyperdense focus superficially in the right frontal lobe at the anterior aspect of the  operculum, suspicious for a small acute hemorrhage (series 6, image 22 and series 5, image 52). There is no associated edema. No intracranial hemorrhage is identified elsewhere. There is no evidence of an acute infarct, midline shift, or extra-axial fluid collection. Patchy cerebral white matter hypodensities are stable to minimally increased and are nonspecific but compatible with moderate chronic small vessel ischemic disease. There is moderate cerebral atrophy. Vascular: Calcified atherosclerosis at the skull base. No hyperdense vessel. Skull: No acute fracture or suspicious osseous lesion. Sinuses/Orbits: Completely opacified right  maxillary sinus by hyperdense/inspissated material. Clear mastoid air cells. Unremarkable orbits. Other: Left lateral forehead hematoma. CT CERVICAL SPINE FINDINGS Alignment: Mild reversal of the normal cervical lordosis. Mild right convex curvature of the cervical spine. Trace anterolisthesis of C3 on C4, T1 on T2, and T2 on T3. Unchanged 5 mm anterolisthesis of C7 on T1. Skull base and vertebrae: No acute fracture or suspicious osseous lesion. Soft tissues and spinal canal: No prevertebral fluid or swelling. No visible canal hematoma. Disc levels: Similar appearance of advanced disc degeneration from C5-6 through C7-T1 with milder disc degeneration elsewhere. Widespread advanced cervical facet arthrosis. Mild-to-moderate multilevel spinal stenosis and moderate to severe multilevel neural foraminal stenosis. Upper chest: Emphysema and scarring in the lung apices. Other: Mild atherosclerotic calcification at the carotid bifurcations. Critical Value/emergent results were called by telephone at the time of interpretation on 11/25/2023 at 4:32 pm to Dr. Andria Meuse, who verbally acknowledged these results. IMPRESSION: 1. New 5 mm hyperdense focus in the anterior right frontal lobe suspicious for a small acute hemorrhage/hemorrhagic contusion. No edema. 2. Left lateral forehead hematoma. 3. No acute cervical spine fracture. Electronically Signed   By: Sebastian Ache M.D.   On: 11/25/2023 16:32   CT Cervical Spine Wo Contrast Result Date: 11/25/2023 CLINICAL DATA:  Head trauma, moderate-severe; Neck trauma (Age >= 65y). Fall with forehead strike. EXAM: CT HEAD WITHOUT CONTRAST CT CERVICAL SPINE WITHOUT CONTRAST TECHNIQUE: Multidetector CT imaging of the head and cervical spine was performed following the standard protocol without intravenous contrast. Multiplanar CT image reconstructions of the cervical spine were also generated. RADIATION DOSE REDUCTION: This exam was performed according to the departmental  dose-optimization program which includes automated exposure control, adjustment of the mA and/or kV according to patient size and/or use of iterative reconstruction technique. COMPARISON:  CT head and cervical spine 05/15/2022. MRI head 04/27/2022. FINDINGS: CT HEAD FINDINGS Brain: There is a new 5 mm round hyperdense focus superficially in the right frontal lobe at the anterior aspect of the operculum, suspicious for a small acute hemorrhage (series 6, image 22 and series 5, image 52). There is no associated edema. No intracranial hemorrhage is identified elsewhere. There is no evidence of an acute infarct, midline shift, or extra-axial fluid collection. Patchy cerebral white matter hypodensities are stable to minimally increased and are nonspecific but compatible with moderate chronic small vessel ischemic disease. There is moderate cerebral atrophy. Vascular: Calcified atherosclerosis at the skull base. No hyperdense vessel. Skull: No acute fracture or suspicious osseous lesion. Sinuses/Orbits: Completely opacified right maxillary sinus by hyperdense/inspissated material. Clear mastoid air cells. Unremarkable orbits. Other: Left lateral forehead hematoma. CT CERVICAL SPINE FINDINGS Alignment: Mild reversal of the normal cervical lordosis. Mild right convex curvature of the cervical spine. Trace anterolisthesis of C3 on C4, T1 on T2, and T2 on T3. Unchanged 5 mm anterolisthesis of C7 on T1. Skull base and vertebrae: No acute fracture or suspicious osseous lesion. Soft tissues and spinal canal: No prevertebral fluid or  swelling. No visible canal hematoma. Disc levels: Similar appearance of advanced disc degeneration from C5-6 through C7-T1 with milder disc degeneration elsewhere. Widespread advanced cervical facet arthrosis. Mild-to-moderate multilevel spinal stenosis and moderate to severe multilevel neural foraminal stenosis. Upper chest: Emphysema and scarring in the lung apices. Other: Mild atherosclerotic  calcification at the carotid bifurcations. Critical Value/emergent results were called by telephone at the time of interpretation on 11/25/2023 at 4:32 pm to Dr. Andria Meuse, who verbally acknowledged these results. IMPRESSION: 1. New 5 mm hyperdense focus in the anterior right frontal lobe suspicious for a small acute hemorrhage/hemorrhagic contusion. No edema. 2. Left lateral forehead hematoma. 3. No acute cervical spine fracture. Electronically Signed   By: Sebastian Ache M.D.   On: 11/25/2023 16:32   DG Chest 1 View Result Date: 11/25/2023 CLINICAL DATA:  [id number] Fall [id number] EXAM: CHEST  1 VIEW COMPARISON:  05/15/2022 FINDINGS: Lungs are clear. No pneumothorax. Stable left subclavian pacemaker. Heart size and mediastinal contours are within normal limits. Aortic Atherosclerosis (ICD10-170.0). No effusion. Visualized bones unremarkable. IMPRESSION: No acute cardiopulmonary disease. Electronically Signed   By: Corlis Leak M.D.   On: 11/25/2023 16:25   DG Pelvis 1-2 Views Result Date: 11/25/2023 CLINICAL DATA:  Fall.  Pain. EXAM: PELVIS - 1-2 VIEW COMPARISON:  Pelvic radiograph dated 08/15/2022. FINDINGS: There is no acute fracture or dislocation. There is moderate arthritic changes of the right hip. Degenerative changes of the lower lumbar spine. The soft tissues are unremarkable. IMPRESSION: 1. No acute fracture or dislocation. 2. Moderate arthritic changes of the right hip. Electronically Signed   By: Elgie Collard M.D.   On: 11/25/2023 16:24    Anti-infectives: Anti-infectives (From admission, onward)    None        Assessment/Plan  83 y/o M s/p fall Frontal ICH - NSGY c/s, Dr. Conchita Paris, per EDP recs for no repeat CT, defer keppra given size of bleed; cog eval yesterday, recommend ongoing SLP follow up.  Frontal hematoma  Forehead laceration - s/p repair with 5, 4-0 prolene sutures by EDP. Will need removal in 7 days  FEN: reg ID; none VTE: SCD's, plan to start DVT ppx tomorrow given small  ICH Foley: spont voids Dispo: PT/OT recommending home health PT. Dispo planning. Labs pending.  I called patients daughter, cheryl. She tells me that the patient lives with his son, Gilmer. The patients wife is a in a memory care unit due to dementia. According to the patients daughter, the son feels he can no longer offer his dad the care he needs at home. His son has reportedly asked for someone from the facility where the patients wife wife is staying to come assess the patient for placement at their nursing facility. I called the patients son to discuss/verify this information and left a voicemail.       LOS: 0 days   I reviewed nursing notes, last 24 h vitals and pain scores, last 48 h intake and output, last 24 h labs and trends, and last 24 h imaging results.  This care required moderate level of medical decision making.   Hosie Spangle, PA-C [facility] Please see Amion for pager number during day hours 7:00am-4:30pm

## 2023-11-27 NOTE — Progress Notes (Signed)
Physical Therapy Treatment Patient Details Name: [name] MRN: [id number] DOB: May 23, 1941 Today's Date: 11/27/2023   History of Present Illness Patient is a 83 yo male presenting to the ED with fall and hitting head on 11/25/23. Patient did lose consciousness. CT head finding R frontal lobe small acute hemorrhage/hemorrhagic contusion, CT of spine clear. PMH includes: CAD, HTN, HLD, DM, on hospice care for COPD.    PT Comments  Pt resting in bed upon PT arrival to room, oriented to self only and pleasant. Pt demonstrating good mobility progression with use of RW, ambulating hallway distance with steadying assist and max safety cues. Pt has support of family at home, anticipate pt is close to baseline. Will continue to follow.   VSS on RA    If plan is discharge home, recommend the following: A little help with walking and/or transfers;A lot of help with bathing/dressing/bathroom;Assistance with cooking/housework;Help with stairs or ramp for entrance;Assist for transportation   Can travel by private vehicle        Equipment Recommendations  None recommended by PT    Recommendations for Other Services       Precautions / Restrictions Precautions Precautions: Fall Restrictions Weight Bearing Restrictions Per Provider Order: No     Mobility  Bed Mobility Overal bed mobility: Needs Assistance Bed Mobility: Supine to Sit, Sit to Supine     Supine to sit: Min assist Sit to supine: Min assist   General bed mobility comments: assist for trunk elevation off of bed, LE lift back into bed.    Transfers Overall transfer level: Needs assistance Equipment used: Rolling walker (2 wheels) Transfers: Sit to/from Stand Sit to Stand: Min assist           General transfer comment: assist for rise and steady    Ambulation/Gait Ambulation/Gait assistance: Min assist Gait Distance (Feet): 120 Feet Assistive device: Rolling walker (2 wheels) Gait Pattern/deviations:  Step-through pattern, Decreased stride length, Wide base of support, Leaning posteriorly, Trunk flexed Gait velocity: decr     General Gait Details: assist to steady, max cues for placement in RW as RW tends to get far in front of pt, hallway navigation   Stairs             Wheelchair Mobility     Tilt Bed    Modified Rankin (Stroke Patients Only)       Balance Overall balance assessment: Needs assistance Sitting-balance support: No upper extremity supported, Feet supported Sitting balance-Leahy Scale: Fair     Standing balance support: Bilateral upper extremity supported, Reliant on assistive device for balance, During functional activity Standing balance-Leahy Scale: Poor Standing balance comment: reliant on external support from PT and RW                            Cognition Arousal: Alert Behavior During Therapy: WFL for tasks assessed/performed Overall Cognitive Status: No family/caregiver present to determine baseline cognitive functioning Area of Impairment: Orientation, Memory, Attention, Problem solving                 Orientation Level: Disoriented to, Place, Time, Situation Current Attention Level: Sustained Memory: Decreased short-term memory       Problem Solving: Slow processing, Requires verbal cues, Requires tactile cues, Decreased initiation, Difficulty sequencing General Comments: pt oriented to location multiple times, by the end of session pt able to state he was in the hospital. baseline dementia. difficulty initiating and sequencing tasks, asks "now  what?" after given one-step commands that he has yet to perform        Exercises      General Comments        Pertinent Vitals/Pain Pain Assessment Pain Assessment: Faces Faces Pain Scale: Hurts little more Pain Location: head Pain Descriptors / Indicators: Discomfort, Guarding, Grimacing Pain Intervention(s): Monitored during session, Limited activity within  patient's tolerance, Repositioned    Home Living                          Prior Function            PT Goals (current goals can now be found in the care plan section) Acute Rehab PT Goals Patient Stated Goal: return home PT Goal Formulation: With patient Time For Goal Achievement: 12/10/23 Potential to Achieve Goals: Fair Progress towards PT goals: Progressing toward goals    Frequency    Min 1X/week      PT Plan      Co-evaluation              AM-PAC PT "6 Clicks" Mobility   Outcome Measure  Help needed turning from your back to your side while in a flat bed without using bedrails?: A Little Help needed moving from lying on your back to sitting on the side of a flat bed without using bedrails?: A Little Help needed moving to and from a bed to a chair (including a wheelchair)?: A Little Help needed standing up from a chair using your arms (e.g., wheelchair or bedside chair)?: A Little Help needed to walk in hospital room?: A Little Help needed climbing 3-5 steps with a railing? : A Lot 6 Click Score: 17    End of Session   Activity Tolerance: Patient tolerated treatment well Patient left: in bed;with bed alarm set;with call bell/phone within reach;with SCD's reapplied;with nursing/sitter in room Nurse Communication: Mobility status PT Visit Diagnosis: Other abnormalities of gait and mobility (R26.89);Other symptoms and signs involving the nervous system (R29.898)     Time: 1610-9604 PT Time Calculation (min) (ACUTE ONLY): 28 min  Charges:    $Gait Training: 8-22 mins $Therapeutic Activity: 8-22 mins PT General Charges $$ ACUTE PT VISIT: 1 Visit                     Marye Round, PT DPT Acute Rehabilitation Services Secure Chat Preferred  Office [phone]    Karmyn Lowman E Stroup 11/27/2023, 10:10 AM

## 2023-11-28 DIAGNOSIS — Z8249 Family history of ischemic heart disease and other diseases of the circulatory system: Secondary | ICD-10-CM | POA: Diagnosis not present

## 2023-11-28 DIAGNOSIS — E785 Hyperlipidemia, unspecified: Secondary | ICD-10-CM | POA: Diagnosis present

## 2023-11-28 DIAGNOSIS — Y92009 Unspecified place in unspecified non-institutional (private) residence as the place of occurrence of the external cause: Secondary | ICD-10-CM | POA: Diagnosis not present

## 2023-11-28 DIAGNOSIS — I2510 Atherosclerotic heart disease of native coronary artery without angina pectoris: Secondary | ICD-10-CM | POA: Diagnosis present

## 2023-11-28 DIAGNOSIS — Z681 Body mass index (BMI) 19 or less, adult: Secondary | ICD-10-CM | POA: Diagnosis not present

## 2023-11-28 DIAGNOSIS — Z955 Presence of coronary angioplasty implant and graft: Secondary | ICD-10-CM | POA: Diagnosis not present

## 2023-11-28 DIAGNOSIS — I442 Atrioventricular block, complete: Secondary | ICD-10-CM | POA: Diagnosis present

## 2023-11-28 DIAGNOSIS — I10 Essential (primary) hypertension: Secondary | ICD-10-CM | POA: Diagnosis present

## 2023-11-28 DIAGNOSIS — Z79899 Other long term (current) drug therapy: Secondary | ICD-10-CM | POA: Diagnosis not present

## 2023-11-28 DIAGNOSIS — S06340A Traumatic hemorrhage of right cerebrum without loss of consciousness, initial encounter: Secondary | ICD-10-CM | POA: Diagnosis not present

## 2023-11-28 DIAGNOSIS — Z23 Encounter for immunization: Secondary | ICD-10-CM | POA: Diagnosis not present

## 2023-11-28 DIAGNOSIS — Z950 Presence of cardiac pacemaker: Secondary | ICD-10-CM | POA: Diagnosis not present

## 2023-11-28 DIAGNOSIS — Z515 Encounter for palliative care: Secondary | ICD-10-CM | POA: Diagnosis not present

## 2023-11-28 DIAGNOSIS — Z7982 Long term (current) use of aspirin: Secondary | ICD-10-CM | POA: Diagnosis not present

## 2023-11-28 DIAGNOSIS — W01198A Fall on same level from slipping, tripping and stumbling with subsequent striking against other object, initial encounter: Secondary | ICD-10-CM | POA: Diagnosis present

## 2023-11-28 DIAGNOSIS — I252 Old myocardial infarction: Secondary | ICD-10-CM | POA: Diagnosis not present

## 2023-11-28 DIAGNOSIS — S0634AA Traumatic hemorrhage of right cerebrum with loss of consciousness status unknown, initial encounter: Secondary | ICD-10-CM | POA: Diagnosis present

## 2023-11-28 DIAGNOSIS — Z66 Do not resuscitate: Secondary | ICD-10-CM | POA: Diagnosis present

## 2023-11-28 DIAGNOSIS — R627 Adult failure to thrive: Secondary | ICD-10-CM | POA: Diagnosis present

## 2023-11-28 DIAGNOSIS — I700 Atherosclerosis of aorta: Secondary | ICD-10-CM | POA: Diagnosis present

## 2023-11-28 DIAGNOSIS — E119 Type 2 diabetes mellitus without complications: Secondary | ICD-10-CM | POA: Diagnosis present

## 2023-11-28 DIAGNOSIS — S0181XA Laceration without foreign body of other part of head, initial encounter: Secondary | ICD-10-CM | POA: Diagnosis present

## 2023-11-28 DIAGNOSIS — Z880 Allergy status to penicillin: Secondary | ICD-10-CM | POA: Diagnosis not present

## 2023-11-28 DIAGNOSIS — R319 Hematuria, unspecified: Secondary | ICD-10-CM | POA: Diagnosis not present

## 2023-11-28 DIAGNOSIS — J4489 Other specified chronic obstructive pulmonary disease: Secondary | ICD-10-CM | POA: Diagnosis present

## 2023-11-28 DIAGNOSIS — I619 Nontraumatic intracerebral hemorrhage, unspecified: Secondary | ICD-10-CM | POA: Diagnosis present

## 2023-11-28 DIAGNOSIS — Z87891 Personal history of nicotine dependence: Secondary | ICD-10-CM | POA: Diagnosis not present

## 2023-11-28 DIAGNOSIS — R451 Restlessness and agitation: Secondary | ICD-10-CM | POA: Diagnosis not present

## 2023-11-28 LAB — BASIC METABOLIC PANEL
Anion gap: 9 (ref 5–15)
BUN: 18 mg/dL (ref 8–23)
CO2: 25 mmol/L (ref 22–32)
Calcium: 9.2 mg/dL (ref 8.9–10.3)
Chloride: 104 mmol/L (ref 98–111)
Creatinine, Ser: 1.13 mg/dL (ref 0.61–1.24)
GFR, Estimated: >60 mL/min (ref >60)
Glucose, Bld: 107 mg/dL — ABNORMAL HIGH (ref 70–99)
Potassium: 3.7 mmol/L (ref 3.5–5.1)
Sodium: 138 mmol/L (ref 135–145)

## 2023-11-28 LAB — MAGNESIUM: Magnesium: 2 mg/dL (ref 1.7–2.4)

## 2023-11-28 LAB — URINE CULTURE

## 2023-11-28 MED ORDER — ENOXAPARIN SODIUM 30 MG/0.3ML IJ SOSY
30.0000 mg | PREFILLED_SYRINGE | Freq: Two times a day (BID) | INTRAMUSCULAR | Status: DC
  Administered 2023-11-28 – 2023-12-03 (×11): 30 mg via SUBCUTANEOUS
  Filled 2023-11-28 (×11): qty 0.3

## 2023-11-28 NOTE — Progress Notes (Signed)
Per [name] (patient daughter/POA), she has made a deposit for [facility] and updated their on hold hospice service. Thomasene Mohair [phone]) from [facility] also contacted this RN soon after to discuss the highly potential transfer. They have held a room for the patient and will send a liaison to do an assessment tomorrow. Furthermore, they will need a QuantiFERON TB gold test and other updated documentation of patient's progress during this hospital admission. Mariea Stable and Lovick MD notified.

## 2023-11-28 NOTE — Progress Notes (Addendum)
Occupational Therapy Treatment Patient Details Name: [name] MRN: [id number] DOB: 1940-11-04 Today's Date: 11/28/2023   History of present illness Patient is a 83 yo male presenting to the ED with fall and hitting head on 11/25/23. Patient did lose consciousness. CT head finding R frontal lobe small acute hemorrhage/hemorrhagic contusion, CT of spine clear. PMH includes: CAD, HTN, HLD, DM, on hospice care for COPD.   OT comments  Pt with cognitive deficits and when returning to supine the monitor alerted bradycardia HR 40-29. Rn immediately called. Pt rolled to check lead on patients back and all noted to be in place. Pt returning to HR 70. Rn called upon exiting the room to make aware of monitor reading but pt was alert and answering therapist. Recommendation for skilled rehab pending patients family ability to (A) . No family present to establish baseline cognition.      If plan is discharge home, recommend the following:  A little help with walking and/or transfers;A lot of help with bathing/dressing/bathroom;Assist for transportation;Supervision due to cognitive status;Help with stairs or ramp for entrance;Assistance with cooking/housework;Direct supervision/assist for financial management;Direct supervision/assist for medications management;Assistance with feeding   Equipment Recommendations  Other (comment) (TBA)    Recommendations for Other Services      Precautions / Restrictions Precautions Precautions: Fall Precaution Comments: watch HR       Mobility Bed Mobility Overal bed mobility: Needs Assistance Bed Mobility: Rolling, Supine to Sit, Sit to Supine Rolling: Min assist   Supine to sit: Min assist Sit to supine: Mod assist   General bed mobility comments: pt requires (A) to elevate trunk and not fall posteriorly. pt needs pad used to progress to eob sitting. pt requires step by step to return to L side lying and then supine    Transfers Overall transfer level:  Needs assistance Equipment used: 1 person hand held assist Transfers: Sit to/from Stand Sit to Stand: Min assist           General transfer comment: pt face to face transfer toward [facility]     Balance                               High Level Balance Comments: attempting to keep patient in standing to step but requesting to return to supine           ADL either performed or assessed with clinical judgement   ADL Overall ADL's : Needs assistance/impaired                     Lower Body Dressing: Maximal assistance Lower Body Dressing Details (indicate cue type and reason): don socks Toilet Transfer: Minimal assistance Toilet Transfer Details (indicate cue type and reason): simulated           General ADL Comments: pt supine onarrival. pt progressed to eob with increased time and effort. pt completed Sit<>tand and requesting to take a nap    Extremity/Trunk Assessment Upper Extremity Assessment Upper Extremity Assessment: Generalized weakness   Lower Extremity Assessment Lower Extremity Assessment: Generalized weakness        Vision   Additional Comments: pt visually attending to therapist on L side. pt was unaware of his head wound and noted to ahve some edema around L eye   Perception     Praxis      Cognition Arousal: Alert Behavior During Therapy: [facility] for tasks assessed/performed Overall Cognitive Status: No family/caregiver  present to determine baseline cognitive functioning Area of Impairment: Orientation, Memory                 Orientation Level: Disoriented to, Time, Situation, Place, Person Current Attention Level: Focused Memory: Decreased short-term memory       Problem Solving: Slow processing General Comments: pt tangential about cooking and food being sold. pt redirected during session. pt requesting to return to bed due to fatigued. pt noted to have decreased HR and pt declines feeling anything differently in teh  moment        Exercises      Shoulder Instructions       General Comments stitches noted at head wound L side of head    Pertinent Vitals/ Pain       Pain Assessment Pain Assessment: No/denies pain  Home Living                                          Prior Functioning/Environment              Frequency  Min 1X/week        Progress Toward Goals  OT Goals(current goals can now be found in the care plan section)  Progress towards OT goals: Not progressing toward goals - comment  Acute Rehab OT Goals Patient Stated Goal: tangential and cognitive deficits noted OT Goal Formulation: Patient unable to participate in goal setting Time For Goal Achievement: 12/10/23 Potential to Achieve Goals: Good ADL Goals Pt Will Perform Lower Body Bathing: with min assist;sitting/lateral leans;sit to/from stand Pt Will Perform Lower Body Dressing: with min assist;sit to/from stand;sitting/lateral leans Pt Will Transfer to Toilet: with contact guard assist;ambulating;regular height toilet;grab bars Pt Will Perform Toileting - Clothing Manipulation and hygiene: with contact guard assist;sitting/lateral leans;sit to/from stand Additional ADL Goal #1: If family present, family will be knowledgeable in concussion education and strategies after handout provided.  Plan      Co-evaluation                 AM-PAC OT "6 Clicks" Daily Activity     Outcome Measure   Help from another person eating meals?: A Little Help from another person taking care of personal grooming?: A Little Help from another person toileting, which includes using toliet, bedpan, or urinal?: A Lot Help from another person bathing (including washing, rinsing, drying)?: A Lot Help from another person to put on and taking off regular upper body clothing?: A Little Help from another person to put on and taking off regular lower body clothing?: A Lot 6 Click Score: 15    End of Session  Equipment Utilized During Treatment: Oxygen  OT Visit Diagnosis: Unsteadiness on feet (R26.81);Other abnormalities of gait and mobility (R26.89);Repeated falls (R29.6);Muscle weakness (generalized) (M62.81);History of falling (Z91.81);Other symptoms and signs involving cognitive function   Activity Tolerance Patient limited by fatigue   Patient Left in bed;with call bell/phone within reach;with bed alarm set   Nurse Communication Mobility status        Time: 1159 (7829)-5621 OT Time Calculation (min): 13 min  Charges: OT General Charges $OT Visit: 1 Visit OT Treatments $Self Care/Home Management : 8-22 mins   Brynn, OTR/L  Acute Rehabilitation Services Office: [phone] .   Mateo Flow 11/28/2023, 1:48 PM

## 2023-11-28 NOTE — TOC Initial Note (Addendum)
Transition of Care (TOC) - Initial/Assessment Note  Donn Pierini RN,BSN Transitions of Care Unit 4NP (Non Trauma)- RN Case Manager See Treatment Team for direct Phone #   Patient Details  Name: [name] MRN: [id number] Date of Birth: 07-10-1941  Transition of Care [facility]) CM/SW Contact:    Darrold Span, RN Phone Number: 11/28/2023, 2:18 PM  Clinical Narrative:                 CM spoke with son at bedside regarding transition plan. Pt is from home w/ son, however son voices that he does not feel he can safely take care of his dad at this time and that they (he and his sister) are looking into placing pt into ALF. Per son patient has LT care plan that may cover cost- sister is looking into this. They have had liaison Hazel Sams at [facility] [phone]) from [facility]F come assess pt here at hospital. Pt's wife is also at [facility] in the Memory care Unit.  Also discussed possibility of private pay assistance at home if unable to go to ALF.   Per son pt was active with [facility] PTA- which is currently on hold, If pt goes to ALF- they would use the in-house therapy for PT/OT needs.   Explained to son at this time, pt does not meet criteria for STSNF as he is in OBS status and per PT notes close to baseline with recs for [facility] not SNF.   Family is hopeful for ALF at [facility]- son to update staff when he hears more from his sister. Explained that they will need to work with pt's PCP for needed paperwork and documentation. TOC may be able to assist with FL2, but most documentation will likely need to come from PCP- son voiced understanding.   TOC to continue to follow to, at this time pt does not have a safe discharge as family does not feel they can care for pt in the home.   Expected Discharge Plan: Assisted Living Barriers to Discharge: Other (must enter comment)   Patient Goals and CMS Choice   CMS Medicare.gov Compare Post Acute Care list  provided to:: Patient Represenative (must comment) Choice offered to / list presented to : Adult Children      Expected Discharge Plan and Services   Discharge Planning Services: CM Consult   Living arrangements for the past 2 months: Single Family Home                           HH Arranged: PT          Prior Living Arrangements/Services Living arrangements for the past 2 months: Single Family Home Lives with:: Adult Children Patient language and need for interpreter reviewed:: Yes Do you feel safe going back to the place where you live?: No   family do not feel pt safe to return home w/ son - working on ALF  Need for Family Participation in Patient Care: Yes (Comment) Care giver support system in place?: Yes (comment) Current home services: DME Criminal Activity/Legal Involvement Pertinent to Current Situation/Hospitalization: No - Comment as needed  Activities of Daily Living      Permission Sought/Granted Permission sought to share information with : Facility Industrial/product designer granted to share information with : Yes, Verbal Permission Granted     Permission granted to share info w AGENCY: [facility]        Emotional  Assessment Appearance:: Appears stated age Attitude/Demeanor/Rapport: Inconsistent Affect (typically observed): Accepting Orientation: : Oriented to Self Alcohol / Substance Use: Not Applicable Psych Involvement: No (comment)  Admission diagnosis:  ICH (intracerebral hemorrhage) (HCC) [I61.9] Fall, initial encounter [W19.XXXA] Intracranial hematoma, with unknown loss of consciousness status, initial encounter [facility]) [S06.89AA] Patient Active Problem List   Diagnosis Date Noted   ICH (intracerebral hemorrhage) (HCC) 11/25/2023   Syncope 05/16/2022   Syncope and collapse 05/15/2022   Failure to thrive in adult 05/15/2022   Anemia due to acquired thiamine deficiency 05/13/2022   Diabetes mellitus (HCC) 05/13/2022   Current  severe episode of major depressive disorder without psychotic features without prior episode (HCC) 05/13/2022   Pressure injury of skin 05/01/2022   Complete heart block (HCC) 04/29/2022   Hematuria 04/28/2022   Protein-calorie malnutrition, severe (HCC) 04/28/2022   Depression 04/28/2022   Tobacco abuse 04/28/2022   Hypokalemia 10/04/2021   Bilateral lower extremity edema 10/04/2021   Aortic atherosclerosis (HCC) 09/13/2021   Chronic hypoxemic respiratory failure (HCC) 08/16/2021   Carotid stenosis, asymptomatic, bilateral 12/08/2017   Demand ischemia of myocardium (HCC)    Essential hypertension    COPD III/ hypercarbic 10/07/2016   Chronic respiratory failure with hypercapnia (HCC) 10/07/2016   Pure hyperglyceridemia 09/04/2016   Moderate persistent asthma without complication 09/04/2016   Essential hypertension, benign 10/10/2009   Hyperlipidemia LDL goal <70 10/07/2008   CORONARY ATHEROSCLEROSIS NATIVE CORONARY ARTERY 10/07/2008   Cerebrovascular disease 10/07/2008   PCP:  Etta Grandchild, MD Pharmacy:   [id number] - [place], [place] - [address] AT [facility] OF [address] [place] [address] Phone: [phone] Fax: [phone]  [facility] #[id number] [place], [place] - [address] AT [facility] OF [address] & [facility] [address] [place] [address] Phone: [phone] Fax: [phone]  [facility] #[id number] [place], [place] - [address] AT [facility] OF [address] & [facility] [address] [place] [address] Phone: [phone] Fax: [phone]     Social Drivers of Health (SDOH) Social History: SDOH Screenings   Food Insecurity: Patient Unable To Answer (11/26/2023)  Housing: Patient Unable To Answer (11/26/2023)  Transportation Needs: Patient Unable To Answer (11/26/2023)  Utilities: Patient Unable To Answer (11/26/2023)  Depression (PHQ2-9): High Risk (05/13/2022)   Social Connections: Patient Unable To Answer (11/26/2023)  Tobacco Use: Medium Risk (05/15/2022)   SDOH Interventions:     Readmission Risk Interventions    05/20/2022   12:13 PM  Readmission Risk Prevention Plan  Transportation Screening Complete  Home Care Screening Complete

## 2023-11-28 NOTE — Progress Notes (Signed)
Trauma/Critical Care Follow Up Note  Subjective:    Overnight Issues:   Objective:  Vital signs for last 24 hours: Temp:  [97.6 F (36.4 C)-98.7 F (37.1 C)] 97.9 F (36.6 C) (01/31 0747) Pulse Rate:  [73-104] 89 (01/31 0747) Resp:  [15-20] 15 (01/31 0747) BP: (120-147)/(61-87) 139/85 (01/31 0747) SpO2:  [95 %-100 %] 100 % (01/31 0747)  Hemodynamic parameters for last 24 hours:    Intake/Output from previous day: 01/30 0701 - 01/31 0700 In: -  Out: 450 [Urine:450]  Intake/Output this shift: No intake/output data recorded.  Vent settings for last 24 hours:    Physical Exam:  Gen: comfortable, no distress Neuro: follows commands, alert, communicative HEENT: PERRL Neck: supple CV: bradycardic Pulm: unlabored breathing on RA Abd: soft, NT   ,    GU: urine clear and yellow, +spontaneous voids Extr: wwp, no edema  Results for orders placed or performed during the hospital encounter of 11/25/23 (from the past 24 hours)  Urinalysis, w/ Reflex to Culture (Infection Suspected) -Urine, Random     Status: Abnormal   Collection Time: 11/27/23  4:04 PM  Result Value Ref Range   Specimen Source URINE, RANDOM    Color, Urine AMBER (A) YELLOW   APPearance CLOUDY (A) CLEAR   Specific Gravity, Urine 1.015 1.005 - 1.030   pH 7.0 5.0 - 8.0   Glucose, UA NEGATIVE NEGATIVE mg/dL   Hgb urine dipstick LARGE (A) NEGATIVE   Bilirubin Urine NEGATIVE NEGATIVE   Ketones, ur 5 (A) NEGATIVE mg/dL   Protein, ur 093 (A) NEGATIVE mg/dL   Nitrite NEGATIVE NEGATIVE   Leukocytes,Ua NEGATIVE NEGATIVE   RBC / HPF >50 0 - 5 RBC/hpf   WBC, UA 11-20 0 - 5 WBC/hpf   Bacteria, UA RARE (A) NONE SEEN   Squamous Epithelial / HPF 0-5 0 - 5 /HPF   Mucus PRESENT   Basic metabolic panel     Status: Abnormal   Collection Time: 11/28/23  5:07 AM  Result Value Ref Range   Sodium 138 135 - 145 mmol/L   Potassium 3.7 3.5 - 5.1 mmol/L   Chloride 104 98 - 111 mmol/L   CO2 25 22 - 32 mmol/L   Glucose,  Bld 107 (H) 70 - 99 mg/dL   BUN 18 8 - 23 mg/dL   Creatinine, Ser 2.35 0.61 - 1.24 mg/dL   Calcium 9.2 8.9 - 57.3 mg/dL   GFR, Estimated >22 >02 mL/min   Anion gap 9 5 - 15  Magnesium     Status: None   Collection Time: 11/28/23  5:07 AM  Result Value Ref Range   Magnesium 2.0 1.7 - 2.4 mg/dL    Assessment & Plan: The plan of care was discussed with the bedside nurse for the day, who is in agreement with this plan and no additional concerns were raised.   Present on Admission:  ICH (intracerebral hemorrhage) (HCC)    LOS: 0 days   Additional comments:I reviewed the patient's new clinical lab test results.   and I reviewed the patients new imaging test results.    83 y/o M s/p fall  Frontal ICH - NSGY c/s, Dr. Conchita Paris, per EDP recs for no repeat CT, defer keppra given size of bleed; cog eval yesterday, recommend ongoing SLP follow up.  Frontal hematoma  Forehead laceration - s/p repair with 5, 4-0 prolene sutures by EDP. Will need removal in 7 days FEN: reg ID; none VTE: SCD's, start LMWH Foley: spont voids  Dispo: PT/OT recommending home health PT. Dispo planning to SNF vs memory care facility per family request  Diamantina Monks, MD Trauma & General Surgery Please use [email or web address] to contact on call provider  11/28/2023  *Care during the described time interval was provided by me. I have reviewed this patient's available data, including medical history, events of note, physical examination and test results as part of my evaluation.

## 2023-11-28 NOTE — Progress Notes (Signed)
Speech Language Pathology Treatment: Cognitive-Linquistic  Patient Details Name: [name] MRN: [id number] DOB: 15-Jun-1941 Today's Date: 11/28/2023 Time: 1000-1012 SLP Time Calculation (min) (ACUTE ONLY): 12 min  Assessment / Plan / Recommendation Clinical Impression  Pt lethargic today, apparently was restless all night, medicated and now sleepy. Did not eat breakfast today. Pt arousable to auditory stimuli, occasionally opens eyes. Pt is oriented to self and able to reply to simple biographical questions. SLP repeatedly oriented pt in conversation and used visual feedback to make pt more aware of injury. Pt usually stated "well I dont remember that" to these statements. Still participating in brief social language but attention is only briefly focused to this. Will f/u when more alert.   HPI HPI: Pt is a 83 y.o. male admitted on 01/28 after a fall in the bathroom where he struck left side of head. Patient is on hospice for COPD.  Patient reportedly fell down the bathroom and son heard a noise. Patient was able to recall the episode to EMS, but was unable to do so in hospital.      SLP Plan  Continue with current plan of care      Recommendations for follow up therapy are one component of a multi-disciplinary discharge planning process, led by the attending physician.  Recommendations may be updated based on patient status, additional functional criteria and insurance authorization.    Recommendations                                 Continue with current plan of care     Jaycelynn Knickerbocker, Riley Nearing  11/28/2023, 12:08 PM

## 2023-11-29 NOTE — Plan of Care (Signed)
  Problem: Clinical Measurements: Goal: Will remain free from infection Outcome: Progressing Goal: Respiratory complications will improve Outcome: Progressing Goal: Cardiovascular complication will be avoided Outcome: Progressing   Problem: Nutrition: Goal: Adequate nutrition will be maintained Outcome: Progressing   Problem: Coping: Goal: Level of anxiety will decrease Outcome: Progressing   Problem: Elimination: Goal: Will not experience complications related to urinary retention Outcome: Progressing   Problem: Pain Managment: Goal: General experience of comfort will improve and/or be controlled Outcome: Progressing   Problem: Safety: Goal: Ability to remain free from injury will improve Outcome: Progressing   Problem: Skin Integrity: Goal: Risk for impaired skin integrity will decrease Outcome: Progressing

## 2023-11-29 NOTE — NC FL2 (Signed)
[place] MEDICAID FL2 LEVEL OF CARE FORM     IDENTIFICATION  Patient Name: [name] Birthdate: Mar 27, 1941 Sex: male Admission Date (Current Location): 11/25/2023  [facility] and [place] Number:  Producer, television/film/video and Address:  [facility] [place], [address], [place], [place] [address]      Provider Number: [id number]  Attending Physician Name and Address:  Md, Trauma, MD  Relative Name and Phone Number:       Current Level of Care: Hospital Recommended Level of Care: Assisted Living Facility Prior Approval Number:    Date Approved/Denied:   PASRR Number:    Discharge Plan: Other (Comment) (ALF)    Current Diagnoses: Patient Active Problem List   Diagnosis Date Noted   ICH (intracerebral hemorrhage) (HCC) 11/25/2023   Syncope 05/16/2022   Syncope and collapse 05/15/2022   Failure to thrive in adult 05/15/2022   Anemia due to acquired thiamine deficiency 05/13/2022   Diabetes mellitus (HCC) 05/13/2022   Current severe episode of major depressive disorder without psychotic features without prior episode (HCC) 05/13/2022   Pressure injury of skin 05/01/2022   Complete heart block (HCC) 04/29/2022   Hematuria 04/28/2022   Protein-calorie malnutrition, severe (HCC) 04/28/2022   Depression 04/28/2022   Tobacco abuse 04/28/2022   Hypokalemia 10/04/2021   Bilateral lower extremity edema 10/04/2021   Aortic atherosclerosis (HCC) 09/13/2021   Chronic hypoxemic respiratory failure (HCC) 08/16/2021   Carotid stenosis, asymptomatic, bilateral 12/08/2017   Demand ischemia of myocardium (HCC)    Essential hypertension    COPD III/ hypercarbic 10/07/2016   Chronic respiratory failure with hypercapnia (HCC) 10/07/2016   Pure hyperglyceridemia 09/04/2016   Moderate persistent asthma without complication 09/04/2016   Essential hypertension, benign 10/10/2009   Hyperlipidemia LDL goal <70 10/07/2008   CORONARY ATHEROSCLEROSIS NATIVE CORONARY ARTERY  10/07/2008   Cerebrovascular disease 10/07/2008    Orientation RESPIRATION BLADDER Height & Weight     Self  Normal Incontinent Weight: 140 lb (63.5 kg) Height:  5\' 9"  (175.3 cm)  BEHAVIORAL SYMPTOMS/MOOD NEUROLOGICAL BOWEL NUTRITION STATUS      Incontinent    AMBULATORY STATUS COMMUNICATION OF NEEDS Skin   Limited Assist   Normal                       Personal Care Assistance Level of Assistance  Bathing, Feeding, Dressing Bathing Assistance: Limited assistance Feeding assistance: Limited assistance Dressing Assistance: Limited assistance     Functional Limitations Info             SPECIAL CARE FACTORS FREQUENCY  PT (By licensed PT), OT (By licensed OT)                    Contractures Contractures Info: Not present    Additional Factors Info  Code Status Code Status Info: DNR             Current Medications (11/29/2023):  This is the current hospital active medication list Current Facility-Administered Medications  Medication Dose Route Frequency Provider Last Rate Last Admin   acetaminophen (TYLENOL) tablet 1,000 mg  1,000 mg Oral Q6H Lovick, Lennie Odor, MD   1,000 mg at 11/29/23 1158   docusate sodium (COLACE) capsule 100 mg  100 mg Oral BID Diamantina Monks, MD   100 mg at 11/29/23 1034   enoxaparin (LOVENOX) injection 30 mg  30 mg Subcutaneous BID Diamantina Monks, MD   30 mg at 11/29/23 1034  hydrALAZINE (APRESOLINE) injection 10 mg  10 mg Intravenous Q2H PRN Diamantina Monks, MD       methocarbamol (ROBAXIN) tablet 500 mg  500 mg Oral Q8H PRN Adam Phenix, PA-C   500 mg at 11/29/23 0865   metoprolol tartrate (LOPRESSOR) injection 5 mg  5 mg Intravenous Q6H PRN Diamantina Monks, MD       morphine (PF) 2 MG/ML injection 2 mg  2 mg Intravenous Q4H PRN Diamantina Monks, MD   2 mg at 11/28/23 2054   ondansetron (ZOFRAN-ODT) disintegrating tablet 4 mg  4 mg Oral Q6H PRN Diamantina Monks, MD       Or   ondansetron (ZOFRAN) injection 4 mg  4 mg  Intravenous Q6H PRN Diamantina Monks, MD       oxyCODONE (Oxy IR/ROXICODONE) immediate release tablet 2.5-5 mg  2.5-5 mg Oral Q4H PRN Diamantina Monks, MD   5 mg at 11/29/23 0520   polyethylene glycol (MIRALAX / GLYCOLAX) packet 17 g  17 g Oral Daily PRN Diamantina Monks, MD       Tdap (BOOSTRIX) injection 0.5 mL  0.5 mL Intramuscular Once Anders Simmonds T, DO         Discharge Medications: Please see discharge summary for a list of discharge medications.  Relevant Imaging Results:  Relevant Lab Results:   Additional Information SS# [id number]  Deatra Robinson, [place]

## 2023-11-29 NOTE — TOC Progression Note (Signed)
Transition of Care [facility]) - Progression Note    Patient Details  Name: [name] MRN: [id number] Date of Birth: 31-Aug-1941  Transition of Care [facility]) CM/SW Contact  Dellie Burns [place], [place] Phone Number: 11/29/2023, 2:46 PM  Clinical Narrative:  Spoke to Thomasene Mohair with [facility] [phone] at pt's son's request. Per Efraim Kaufmann, they are planning to admit pt Monday pending TB results and onsite assessment. Requested information emailed to [facility] at [email or web address] at her request. Will provide updates as available.    Dellie Burns, MSW, LCSW [phone] (coverage)       Expected Discharge Plan: Assisted Living Barriers to Discharge: Other (must enter comment)  Expected Discharge Plan and Services   Discharge Planning Services: CM Consult   Living arrangements for the past 2 months: Single Family Home                           HH Arranged: PT           Social Determinants of Health (SDOH) Interventions SDOH Screenings   Food Insecurity: Patient Unable To Answer (11/26/2023)  Housing: Patient Unable To Answer (11/26/2023)  Transportation Needs: Patient Unable To Answer (11/26/2023)  Utilities: Patient Unable To Answer (11/26/2023)  Depression (PHQ2-9): High Risk (05/13/2022)  Social Connections: Patient Unable To Answer (11/26/2023)  Tobacco Use: Medium Risk (05/15/2022)    Readmission Risk Interventions    05/20/2022   12:13 PM  Readmission Risk Prevention Plan  Transportation Screening Complete  Home Care Screening Complete

## 2023-11-29 NOTE — Progress Notes (Addendum)
  Trauma Service Note  Chief Complaint/Subjective: No acute changes  Objective: Vital signs in last 24 hours: Temp:  [97.9 F (36.6 C)-98.8 F (37.1 C)] 98.2 F (36.8 C) (02/01 0324) Pulse Rate:  [71-100] 100 (02/01 0324) Resp:  [11-18] 18 (02/01 0324) BP: (112-140)/(65-74) 138/65 (02/01 0732) SpO2:  [94 %-99 %] 94 % (02/01 0324) Last BM Date : 11/27/23  Intake/Output from previous day: 01/31 0701 - 02/01 0700 In: -  Out: 400 [Urine:400]  General: NAD, large left frontal hematoma Lungs: nonlabored Abd: soft, NT Extremities: no edema Neuro: conversive  Lab Results:  Recent Labs    11/27/23 1013  WBC 9.1  HGB 12.9*  HCT 38.1*  PLT 286   Recent Labs    11/27/23 1013 11/28/23 0507  NA 137 138  K 3.0* 3.7  CL 98 104  CO2 28 25  GLUCOSE 95 107*  BUN 17 18  CREATININE 1.21 1.13  CALCIUM 9.5 9.2   No results for input(s): "LABPROT", "INR" in the last 72 hours. No results for input(s): "PHART", "HCO3" in the last 72 hours.  Invalid input(s): "PCO2", "PO2"  Anti-infectives: Anti-infectives (From admission, onward)    None       Assessment/Plan: 83 y/o M s/p fall   Frontal ICH - NSGY c/s, Dr. Conchita Paris, per EDP recs for no repeat CT, defer keppra given size of bleed; cog eval yesterday, recommend ongoing SLP follow up.  Frontal hematoma  Forehead laceration - s/p repair with 5, 4-0 prolene sutures by EDP. Will need removal in 7 days FEN: reg ID; none VTE: SCD's, start LMWH Foley: spont voids Dispo: PT/OT recommending home health PT. Dispo Room being held at [facility], in-person assessment planned 2/1. Need qunatiferon gold test results, SLP/PT/OT progress notes, medication list, and hospital admission summary sent to [facility] [phone] [email or web address] BEFORE they will come. Please have CM/SW do this on 2/1. This info was received after hours 1/31. QF was ordered, unsure if it is a sendout. (Per weekend SW, their signout was for Monday  discharge)   LOS: 1 day   I reviewed last 24 h vitals and pain scores, last 48 h intake and output, last 24 h labs and trends, and last 24 h imaging results.  This care required moderate level of medical decision making.   De Blanch Shaneta Cervenka Trauma Surgeon [phone] Surgery at [facility] 11/29/2023

## 2023-11-30 NOTE — Plan of Care (Signed)
?  Problem: Clinical Measurements: ?Goal: Respiratory complications will improve ?Outcome: Progressing ?  ?Problem: Activity: ?Goal: Risk for activity intolerance will decrease ?Outcome: Progressing ?  ?Problem: Nutrition: ?Goal: Adequate nutrition will be maintained ?Outcome: Progressing ?  ?Problem: Coping: ?Goal: Level of anxiety will decrease ?Outcome: Progressing ?  ?

## 2023-11-30 NOTE — Progress Notes (Signed)
  Trauma Service Note  Chief Complaint/Subjective: No acute changes, tolerating diet  Objective: Vital signs in last 24 hours: Temp:  [97.6 F (36.4 C)-98.2 F (36.8 C)] 98.1 F (36.7 C) (02/02 1143) Pulse Rate:  [70-79] 79 (02/02 0017) Resp:  [16-20] 20 (02/02 0302) BP: (115-149)/(58-70) 128/58 (02/02 1143) SpO2:  [94 %-100 %] 98 % (02/02 0017) Last BM Date : 11/27/23  Intake/Output from previous day: 02/01 0701 - 02/02 0700 In: 720 [P.O.:720] Out: 1300 [Urine:1300]  General: NAD Lungs: nonlabored Abd: soft, NT Extremities: no edema Neuro: left sided hematoma, answers questions appropriately  Lab Results:  No results for input(s): "WBC", "HGB", "HCT", "PLT" in the last 72 hours. Recent Labs    11/28/23 0507  NA 138  K 3.7  CL 104  CO2 25  GLUCOSE 107*  BUN 18  CREATININE 1.13  CALCIUM 9.2   No results for input(s): "LABPROT", "INR" in the last 72 hours. No results for input(s): "PHART", "HCO3" in the last 72 hours.  Invalid input(s): "PCO2", "PO2"  Anti-infectives: Anti-infectives (From admission, onward)    None       Assessment/Plan: 83 y/o M s/p fall   Frontal ICH - NSGY c/s, Dr. Conchita Paris, per EDP recs for no repeat CT, defer keppra given size of bleed; cog eval yesterday, recommend ongoing SLP follow up.  Frontal hematoma  Forehead laceration - s/p repair with 5, 4-0 prolene sutures by EDP. Will need removal in 7 days FEN: reg ID; none VTE: SCD's, start LMWH Foley: spont voids [facility]po: [facility]   LOS: 2 days   I reviewed last 24 h vitals and pain scores, last 48 h intake and output, last 24 h labs and trends, and last 24 h imaging results.  This care required moderate level of medical decision making.   De Blanch Cleven Jansma Trauma Surgeon [phone] Surgery at [facility] 11/30/2023

## 2023-12-01 NOTE — Progress Notes (Signed)
Physical Therapy Treatment Patient Details Name: [name] MRN: [id number] DOB: [birth year] Today's Date: 12/01/2023   History of Present Illness Patient is a 83 yo male presenting to the ED with fall and hitting head on 11/25/23. Patient did lose consciousness. CT head finding R frontal lobe small acute hemorrhage/hemorrhagic contusion, CT of spine clear. PMH includes: CAD, HTN, HLD, DM, on hospice care for COPD.    PT Comments  Pt pleasantly confused during session, thinks he is at home. Pt overall requiring min physical assist for bed mobility, transfers, and short-distance hallway gait with RW. Pt fatigues quickly and requires max safety cuing. Current plan is for pt to d/c to ALF with [facility] services. PT to continue to follow.     If plan is discharge home, recommend the following: A little help with walking and/or transfers;A lot of help with bathing/dressing/bathroom;Assistance with cooking/housework;Help with stairs or ramp for entrance;Assist for transportation   Can travel by private vehicle        Equipment Recommendations  None recommended by PT    Recommendations for Other Services       Precautions / Restrictions Precautions Precautions: Fall Restrictions Weight Bearing Restrictions Per Provider Order: No     Mobility  Bed Mobility Overal bed mobility: Needs Assistance Bed Mobility: Supine to Sit     Supine to sit: Min assist     General bed mobility comments: assist for completion of trunk and LE management    Transfers Overall transfer level: Needs assistance Equipment used: Rolling walker (2 wheels) Transfers: Sit to/from Stand Sit to Stand: Min assist           General transfer comment: assist for rise and steady    Ambulation/Gait Ambulation/Gait assistance: Min assist Gait Distance (Feet): 100 Feet Assistive device: Rolling walker (2 wheels) Gait Pattern/deviations: Step-through pattern, Decreased stride length, Wide base of support, Leaning  posteriorly, Trunk flexed Gait velocity: decr     General Gait Details: assist for steadying and guiding RW, pt pushing RW too far anteriorly frequently   Stairs             Wheelchair Mobility     Tilt Bed    Modified Rankin (Stroke Patients Only)       Balance Overall balance assessment: Needs assistance Sitting-balance support: No upper extremity supported, Feet supported Sitting balance-Leahy Scale: Fair     Standing balance support: Bilateral upper extremity supported, Reliant on assistive device for balance, During functional activity Standing balance-Leahy Scale: Poor Standing balance comment: reliant on external support from PT and RW                            Cognition Arousal: Alert Behavior During Therapy: WFL for tasks assessed/performed Overall Cognitive Status: No family/caregiver present to determine baseline cognitive functioning Area of Impairment: Orientation, Memory                 Orientation Level: Disoriented to, Time, Situation, Place Current Attention Level: Focused Memory: Decreased short-term memory       Problem Solving: Slow processing General Comments: confused throughout session, thinks he is at home        Exercises      General Comments        Pertinent Vitals/Pain Pain Assessment Pain Assessment: No/denies pain    Home Living  Prior Function            PT Goals (current goals can now be found in the care plan section) Acute Rehab PT Goals Patient Stated Goal: return home PT Goal Formulation: With patient Time For Goal Achievement: 12/10/23 Potential to Achieve Goals: Fair Progress towards PT goals: Progressing toward goals    Frequency    Min 1X/week      PT Plan      Co-evaluation              AM-PAC PT "6 Clicks" Mobility   Outcome Measure  Help needed turning from your back to your side while in a flat bed without using bedrails?:  A Little Help needed moving from lying on your back to sitting on the side of a flat bed without using bedrails?: A Little Help needed moving to and from a bed to a chair (including a wheelchair)?: A Little Help needed standing up from a chair using your arms (e.g., wheelchair or bedside chair)?: A Little Help needed to walk in hospital room?: A Little Help needed climbing 3-5 steps with a railing? : A Lot 6 Click Score: 17    End of Session   Activity Tolerance: Patient tolerated treatment well Patient left: in bed;with bed alarm set;with call bell/phone within reach;Other (comment) (hospice rn at bedside) Nurse Communication: Mobility status PT Visit Diagnosis: Other abnormalities of gait and mobility (R26.89);Other symptoms and signs involving the nervous system (R29.898)     Time: 9604-5409 PT Time Calculation (min) (ACUTE ONLY): 22 min  Charges:    $Therapeutic Activity: 8-22 mins PT General Charges $$ ACUTE PT VISIT: 1 Visit                     [name], PT DPT Acute Rehabilitation Services Secure Chat Preferred  Office [phone]    Cannan Beeck E Christain [place] 12/01/2023, 3:17 PM

## 2023-12-01 NOTE — Progress Notes (Addendum)
  Trauma Service Note  Chief Complaint/Subjective: No acute changes, son at bedside. Oriented to self and hospital, intermittent confused speech.    Objective: Vital signs in last 24 hours: Temp:  [97.3 F (36.3 C)-98.1 F (36.7 C)] 97.3 F (36.3 C) (02/03 0751) Pulse Rate:  [69-102] 78 (02/03 0751) Resp:  [14-19] 17 (02/03 0751) BP: (120-145)/(58-118) 137/118 (02/03 0751) SpO2:  [93 %-97 %] 93 % (02/03 0751) Last BM Date : 11/28/23  Intake/Output from previous day: 02/02 0701 - 02/03 0700 In: 300 [P.O.:300] Out: 1150 [Urine:1150]  General: NAD Lungs: nonlabored Abd: soft, NT Extremities: no edema Neuro: left sided hematoma, answers questions appropriately   Anti-infectives: Anti-infectives (From admission, onward)    None       Assessment/Plan: 83 y/o M s/p fall   Frontal ICH - NSGY c/s, Dr. Conchita Paris, per EDP recs for no repeat CT, defer keppra given size of bleed; cog eval, recommend ongoing SLP follow up.  Frontal hematoma  Forehead laceration - s/p repair with 5, 4-0 prolene sutures by EDP. Will need removal in 7 days - tomorrow 2/4. FEN: reg ID; none VTE: SCD's, start LMWH Foley: spont voids Dispo: [facility] once cleared by their facility. Medically stable for discharge    LOS: 3 days   I reviewed last 24 h vitals and pain scores, last 48 h intake and output, last 24 h labs and trends, and last 24 h imaging results.  This care required moderate level of medical decision making.   Francine Graven Maisley Hainsworth Trauma & General Surgery [phone] Surgery at [facility] 12/01/2023

## 2023-12-01 NOTE — TOC Progression Note (Signed)
Transition of Care [facility]) - Progression Note    Patient Details  Name: TAUREAN JU MRN: [id number] Date of Birth: 12/21/1940  Transition of Care [facility]) CM/SW Contact  Glennon Mac, RN Phone Number: 12/01/2023, 3:24 PM  Clinical Narrative:   Quantiferon-TB test results still pending.  Spoke with Thomasene Mohair with [facility] [phone]) and made her aware of continued pending result.  She states that they are able to admit patient tomorrow if results become available.  Will email to Ms. [email or web address] when available.    Expected Discharge Plan: Assisted Living Barriers to Discharge: Other (must enter comment)  Expected Discharge Plan and Services   Discharge Planning Services: CM Consult   Living arrangements for the past 2 months: Single Family Home                           HH Arranged: PT           Social Determinants of Health (SDOH) Interventions SDOH Screenings   Food Insecurity: Patient Unable To Answer (11/26/2023)  Housing: Patient Unable To Answer (11/26/2023)  Transportation Needs: Patient Unable To Answer (11/26/2023)  Utilities: Patient Unable To Answer (11/26/2023)  Depression (PHQ2-9): High Risk (05/13/2022)  Social Connections: Patient Unable To Answer (11/26/2023)  Tobacco Use: Medium Risk (05/15/2022)    Readmission Risk Interventions    05/20/2022   12:13 PM  Readmission Risk Prevention Plan  Transportation Screening Complete  Home Care Screening Complete   Quintella Baton, RN, BSN  Trauma/Neuro ICU Case Manager [phone]

## 2023-12-01 NOTE — Plan of Care (Signed)
  Problem: Clinical Measurements: Goal: Will remain free from infection Outcome: Progressing Goal: Respiratory complications will improve Outcome: Progressing Goal: Cardiovascular complication will be avoided Outcome: Progressing   Problem: Activity: Goal: Risk for activity intolerance will decrease Outcome: Progressing   Problem: Nutrition: Goal: Adequate nutrition will be maintained Outcome: Progressing   Problem: Coping: Goal: Level of anxiety will decrease Outcome: Progressing   Problem: Elimination: Goal: Will not experience complications related to urinary retention Outcome: Progressing   Problem: Pain Managment: Goal: General experience of comfort will improve and/or be controlled Outcome: Progressing   Problem: Safety: Goal: Ability to remain free from injury will improve Outcome: Progressing

## 2023-12-01 NOTE — Care Management Important Message (Signed)
Important Message  Patient Details  Name: [name] MRN: [id number] Date of Birth: 1941/06/25   Important Message Given:  Yes - Medicare IM     Dorena Bodo 12/01/2023, 2:56 PM

## 2023-12-02 MED ORDER — ORAL CARE MOUTH RINSE: 15.0000 mL | OROMUCOSAL | Status: DC | PRN

## 2023-12-02 NOTE — Progress Notes (Signed)
 Occupational Therapy Treatment Patient Details Name: [name] MRN: [id number] DOB: 1940/12/15 Today's Date: 12/02/2023   History of present illness Patient is a 83 yo male presenting to the ED with fall and hitting head on 11/25/23. Patient did lose consciousness. CT head finding R frontal lobe small acute hemorrhage/hemorrhagic contusion, CT of spine clear. PMH includes: CAD, HTN, HLD, DM, on hospice care for COPD.   OT comments  Patient received in supine and agreeable to OT session and OOB. Patient required cues for sequencing and min assist to get to EOB and for transfer to recliner with RW and min assist for balance due to posterior bias. Patient stated he was hold and asked to donn pants. Patient required mod assist to thread legs into clothing and max assist to pull up due to reliant on RW for balance. Patient performed grooming while seated in recliner and became more lethargic at end of session with BP 113/48 (64). Patient will benefit from continued inpatient follow up therapy, <3 hours/day.  Acute OT to continue to follow to address established goals to facilitate DC to next venue of care.       If plan is discharge home, recommend the following:  A little help with walking and/or transfers;A lot of help with bathing/dressing/bathroom;Assist for transportation;Supervision due to cognitive status;Help with stairs or ramp for entrance;Assistance with cooking/housework;Direct supervision/assist for financial management;Direct supervision/assist for medications management;Assistance with feeding   Equipment Recommendations  Other (comment) (TBA)    Recommendations for Other Services      Precautions / Restrictions Precautions Precautions: Fall Precaution Comments: watch HR Restrictions Weight Bearing Restrictions Per Provider Order: No       Mobility Bed Mobility Overal bed mobility: Needs Assistance Bed Mobility: Supine to Sit     Supine to sit: Min assist, Used rails      General bed mobility comments: cues for rail use and assistance with trunk    Transfers Overall transfer level: Needs assistance Equipment used: Rolling walker (2 wheels) Transfers: Sit to/from Stand, Bed to chair/wheelchair/BSC Sit to Stand: Min assist     Step pivot transfers: Min assist     General transfer comment: min assists to stand and for balance due to posterior bias     Balance Overall balance assessment: Needs assistance Sitting-balance support: No upper extremity supported, Feet supported Sitting balance-Leahy Scale: Fair Sitting balance - Comments: supervision for safety Postural control: Posterior lean Standing balance support: Bilateral upper extremity supported, Reliant on assistive device for balance, During functional activity Standing balance-Leahy Scale: Poor Standing balance comment: posterior bias while standing and required min assist for balance and reliant on RW                           ADL either performed or assessed with clinical judgement   ADL Overall ADL's : Needs assistance/impaired     Grooming: Wash/dry hands;Wash/dry face;Supervision/safety;Sitting Grooming Details (indicate cue type and reason): cues to initiate         Upper Body Dressing : Minimal assistance;Sitting Upper Body Dressing Details (indicate cue type and reason): donn gown Lower Body Dressing: Moderate assistance;Maximal assistance;Sit to/from stand Lower Body Dressing Details (indicate cue type and reason): donned pants with assistance to thread legs into clothing. Patient attempted to assist with pulling up pant but was unable to maintain balance Toilet Transfer: Minimal assistance;Rolling walker (2 wheels) Toilet Transfer Details (indicate cue type and reason): simulated  General ADL Comments: cues throughout for sequencing and staying on task    Extremity/Trunk Assessment              Vision       Perception     Praxis       Cognition Arousal: Alert, Lethargic Behavior During Therapy: WFL for tasks assessed/performed Overall Cognitive Status: No family/caregiver present to determine baseline cognitive functioning Area of Impairment: Orientation, Memory                 Orientation Level: Disoriented to, Time, Situation, Place Current Attention Level: Focused Memory: Decreased short-term memory       Problem Solving: Slow processing General Comments: alert initially, became lethargic at end of session        Exercises      Shoulder Instructions       General Comments 99% SpO2 on RA, BP 113/48 (64), HR 83 at end of sesson in recliner    Pertinent Vitals/ Pain       Pain Assessment Pain Assessment: No/denies pain Pain Intervention(s): Monitored during session  Home Living                                          Prior Functioning/Environment              Frequency  Min 1X/week        Progress Toward Goals  OT Goals(current goals can now be found in the care plan section)  Progress towards OT goals: Progressing toward goals  Acute Rehab OT Goals Patient Stated Goal: none stated OT Goal Formulation: Patient unable to participate in goal setting Time For Goal Achievement: 12/10/23 Potential to Achieve Goals: Good ADL Goals Pt Will Perform Lower Body Bathing: with min assist;sitting/lateral leans;sit to/from stand Pt Will Perform Lower Body Dressing: with min assist;sit to/from stand;sitting/lateral leans Pt Will Transfer to Toilet: with contact guard assist;ambulating;regular height toilet;grab bars Pt Will Perform Toileting - Clothing Manipulation and hygiene: with contact guard assist;sitting/lateral leans;sit to/from stand Additional ADL Goal #1: If family present, family will be knowledgeable in concussion education and strategies after handout provided.  Plan      Co-evaluation                 AM-PAC OT 6 Clicks Daily Activity      Outcome Measure   Help from another person eating meals?: A Little Help from another person taking care of personal grooming?: A Little Help from another person toileting, which includes using toliet, bedpan, or urinal?: A Lot Help from another person bathing (including washing, rinsing, drying)?: A Lot Help from another person to put on and taking off regular upper body clothing?: A Little Help from another person to put on and taking off regular lower body clothing?: A Lot 6 Click Score: 15    End of Session Equipment Utilized During Treatment: Rolling walker (2 wheels);Gait belt  OT Visit Diagnosis: Unsteadiness on feet (R26.81);Other abnormalities of gait and mobility (R26.89);Repeated falls (R29.6);Muscle weakness (generalized) (M62.81);History of falling (Z91.81);Other symptoms and signs involving cognitive function   Activity Tolerance Patient tolerated treatment well;Patient limited by fatigue   Patient Left in chair;with call bell/phone within reach;with chair alarm set;with restraints reapplied   Nurse Communication Mobility status        Time: 9279-9251 OT Time Calculation (min): 28 min  Charges: OT General Charges $OT Visit:  1 Visit OT Treatments $Self Care/Home Management : 23-37 mins  Dick George, OTA Acute Rehabilitation Services  Office [phone]   [name] 12/02/2023, 10:28 AM

## 2023-12-02 NOTE — Progress Notes (Signed)
 Patient ID: [name], male   DOB: [birth year], 83 y.o.   MRN: [id number]      Subjective: Up in chair, no complaints, said he ate yesterday ROS negative except as listed above. Objective: Vital signs in last 24 hours: Temp:  [97.4 F (36.3 C)-98.6 F (37 C)] 97.7 F (36.5 C) (02/04 0800) Pulse Rate:  [75-98] 85 (02/04 0255) Resp:  [13-20] 18 (02/04 0800) BP: (99-153)/(43-110) 99/43 (02/04 0800) SpO2:  [90 %-100 %] 99 % (02/04 0800) Last BM Date : 11/28/23  Intake/Output from previous day: 02/03 0701 - 02/04 0700 In: 237 [P.O.:237] Out: 700 [Urine:700] Intake/Output this shift: No intake/output data recorded.  General appearance: cooperative Head: hematoma and abrasion L forehead Resp: clear to auscultation bilaterally GI: soft, NT Extremities: calves soft  Lab Results: CBC  No results for input(s): WBC, HGB, HCT, PLT in the last 72 hours. BMET No results for input(s): NA, K, CL, CO2, GLUCOSE, BUN, CREATININE, CALCIUM  in the last 72 hours. PT/INR No results for input(s): LABPROT, INR in the last 72 hours. ABG No results for input(s): PHART, HCO3 in the last 72 hours.  Invalid input(s): PCO2, PO2  Studies/Results: No results found.  Anti-infectives: Anti-infectives (From admission, onward)    None       Assessment/Plan: Frontal ICH - NSGY c/s, Dr. Lanis, per EDP recs for no repeat CT, defer keppra given size of bleed; cog eval, recommend ongoing SLP follow up.  Frontal hematoma  Forehead laceration - s/p repair with 5, 4-0 prolene sutures by EDP. Will need removal in 7 days - today. FEN: reg ID: none VTE: SCD's, start LMWH Foley: spont voids Dispo: [facility] once cleared by their facility. Medically stable for discharge. Await quantiferon result    LOS: 4 days    Dann Hummer, MD, MPH, FACS Trauma & General Surgery Use [email or web address] to contact on call provider  12/02/2023

## 2023-12-02 NOTE — TOC Progression Note (Signed)
 Transition of Care [facility]) - Progression Note    Patient Details  Name: ISSIAH HUFFAKER MRN: [id number] Date of Birth: [birth year]  [facility]) CM/SW Contact  Sloane Palmer M, RN Phone Number: 12/02/2023, 3:18 PM  Clinical Narrative:    Updated FL2 faxed to Eleanor Mascot at [facility], per her request.  Quantiferon-TB test results still pending at this hour.    Expected Discharge Plan: Assisted Living Barriers to Discharge: Other (must enter comment)  Expected Discharge Plan and Services   Discharge Planning Services: CM Consult   Living arrangements for the past 2 months: Single Family Home                           HH Arranged: PT           Social Determinants of Health (SDOH) Interventions SDOH Screenings   Food Insecurity: Patient Unable To Answer (11/26/2023)  Housing: Patient Unable To Answer (11/26/2023)  Transportation Needs: Patient Unable To Answer (11/26/2023)  Utilities: Patient Unable To Answer (11/26/2023)  Depression (PHQ2-9): High Risk (05/13/2022)  Social Connections: Patient Unable To Answer (11/26/2023)  Tobacco Use: Medium Risk (05/15/2022)    Readmission Risk Interventions    05/20/2022   12:13 PM  Readmission Risk Prevention Plan  Transportation Screening Complete  Home Care Screening Complete   Mliss MICAEL Fass, RN, BSN  Trauma/Neuro ICU Case Manager [phone]

## 2023-12-02 NOTE — NC FL2 (Signed)
 [place]  MEDICAID [id number] LEVEL OF CARE FORM     IDENTIFICATION  Patient Name: [name] Birthdate: 05-06-1941 Sex: male Admission Date (Current Location): 11/25/2023  [facility] [place] Number:  Producer, Television/film/video and Address:  [facility] [place], [address], [place], [place] [address]      Provider Number: [id number]  Attending Physician Name and Address:  Md, Trauma, MD  Relative Name and Phone Number:  Channing Filippo, daughter; [phone]    Current Level of Care: Hospital Recommended Level of Care: Assisted Living Facility Prior Approval Number:    Date Approved/Denied:   PASRR Number:    Discharge Plan: Other (Comment) (ALF)    Current Diagnoses: Patient Active Problem List   Diagnosis Date Noted   ICH (intracerebral hemorrhage) (HCC) 11/25/2023   Syncope 05/16/2022   Syncope and collapse 05/15/2022   Failure to thrive  in adult 05/15/2022   Anemia due to acquired thiamine  deficiency 05/13/2022   Diabetes mellitus (HCC) 05/13/2022   Current severe episode of major depressive disorder without psychotic features without prior episode (HCC) 05/13/2022   Pressure injury of skin 05/01/2022   Complete heart block (HCC) 04/29/2022   Hematuria 04/28/2022   Protein-calorie malnutrition, severe (HCC) 04/28/2022   Depression 04/28/2022   Tobacco abuse 04/28/2022   Hypokalemia 10/04/2021   Bilateral lower extremity edema 10/04/2021   Aortic atherosclerosis (HCC) 09/13/2021   Chronic hypoxemic respiratory failure (HCC) 08/16/2021   Carotid stenosis, asymptomatic, bilateral 12/08/2017   Demand ischemia of myocardium (HCC)    Essential hypertension    COPD III/ hypercarbic 10/07/2016   Chronic respiratory failure with hypercapnia (HCC) 10/07/2016   Pure hyperglyceridemia 09/04/2016   Moderate persistent asthma without complication 09/04/2016   Essential hypertension, benign 10/10/2009   Hyperlipidemia LDL goal <70 10/07/2008   CORONARY  ATHEROSCLEROSIS NATIVE CORONARY ARTERY 10/07/2008   Cerebrovascular disease 10/07/2008    Orientation RESPIRATION BLADDER Height & Weight     Self  Normal Incontinent Weight: 63.5 kg Height:  5' 9 (175.3 cm)  BEHAVIORAL SYMPTOMS/MOOD NEUROLOGICAL BOWEL NUTRITION STATUS      Incontinent    AMBULATORY STATUS COMMUNICATION OF NEEDS Skin   Limited Assist   Normal                       Personal Care Assistance Level of Assistance  Bathing, Feeding, Dressing Bathing Assistance: Limited assistance Feeding assistance: Limited assistance Dressing Assistance: Limited assistance     Functional Limitations Info             SPECIAL CARE FACTORS FREQUENCY  PT (By licensed PT), OT (By licensed OT)                    Contractures Contractures Info: Not present    Additional Factors Info  Allergies Code Status Info: DNR Allergies Info: Penicillins-hives, swelling           Current Medications (12/02/2023):  This is the current hospital active medication list Current Facility-Administered Medications  Medication Dose Route Frequency Provider Last Rate Last Admin   acetaminophen  (TYLENOL ) tablet 1,000 mg  1,000 mg Oral Q6H Lovick, Dreama SAILOR, MD   1,000 mg at 12/02/23 1143   docusate sodium  (COLACE) capsule 100 mg  100 mg Oral BID Paola Dreama SAILOR, MD   100 mg at 12/02/23 1143   enoxaparin  (LOVENOX ) injection 30 mg  30 mg Subcutaneous BID Paola Dreama SAILOR, MD   30 mg at 12/02/23 1145  hydrALAZINE  (APRESOLINE ) injection 10 mg  10 mg Intravenous Q2H PRN Paola Dreama SAILOR, MD       metoprolol  tartrate (LOPRESSOR ) injection 5 mg  5 mg Intravenous Q6H PRN Paola Dreama SAILOR, MD       morphine  (PF) 2 MG/ML injection 2 mg  2 mg Intravenous Q4H PRN Lovick, Ayesha N, MD   2 mg at 12/01/23 2113   ondansetron  (ZOFRAN -ODT) disintegrating tablet 4 mg  4 mg Oral Q6H PRN Paola Dreama SAILOR, MD       Or   ondansetron  (ZOFRAN ) injection 4 mg  4 mg Intravenous Q6H PRN Paola Dreama SAILOR, MD        oxyCODONE  (Oxy IR/ROXICODONE ) immediate release tablet 2.5-5 mg  2.5-5 mg Oral Q4H PRN Paola Dreama SAILOR, MD   5 mg at 12/02/23 0036   polyethylene glycol (MIRALAX  / GLYCOLAX ) packet 17 g  17 g Oral Daily PRN Paola Dreama SAILOR, MD       Tdap (BOOSTRIX ) injection 0.5 mL  0.5 mL Intramuscular Once Mannie Pac T, DO         Discharge Medications: Please see discharge summary for a list of discharge medications.  Relevant Imaging Results:  Relevant Lab Results:   Additional Information SS# [id number]  Mliss MICAEL Fass, RN, BSN  Trauma/Neuro ICU Case Manager [phone]

## 2023-12-02 NOTE — Progress Notes (Signed)
 Speech Language Pathology Treatment: Cognitive-Linquistic  [name] Details Name: [name] MRN: [id number] DOB: July 12, 1941 Today's Date: 12/02/2023 Time: 1430-1450 SLP Time Calculation (min) (ACUTE ONLY): 20 min  Assessment / Plan / Recommendation Clinical Impression  Pt was seen for cognitive tx to promote functional cognitive skills for safety. Pt was intermittently alert during the session but cooperated with tasks. Pt was disoriented to place and situation, with consistent check-ins throughout the session by SLP. Pt exhibited memory issues with recalling answers about orientation and inattention due to consistent lethargy. Pt benefited from visual and tactile cues and errorless learning to learn how to use hospital bed remote to call for help. Pt correctly identified the button used for calling the nurse or to watch tv in 2/3 trials with max cueing. SLP played pt's favorite music to increase pt engagement and reduce lethargy. Pt responded positively to music with slightly more alertness. Recommend continued cognitive treatment focused on increasing safety awareness.  HPI HPI: Pt is a 83 y.o. male admitted on 01/28 after a fall in the bathroom where he struck left side of head. [name] is on hospice for COPD.  [name] reportedly fell down the bathroom and son heard a noise. [name] was able to recall the episode to EMS, but was unable to do so in hospital.      SLP Plan  Continue with current plan of care      Recommendations for follow up therapy are one component of a multi-disciplinary discharge planning process, led by the attending physician.  Recommendations may be updated based on [name] status, additional functional criteria and insurance authorization.    Recommendations                           Cognitive communication deficit (M58.158)     Continue with current plan of care     Waddell Music  12/02/2023, 2:57 PM

## 2023-12-02 NOTE — Progress Notes (Signed)
Katy Trauma RN assisted in suture removal of left head laceration.   Laceration cleaned and sutures removed by Trauma RN.

## 2023-12-03 LAB — QUANTIFERON-TB GOLD PLUS (RQFGPL)
QuantiFERON Mitogen Value: 5.18 [IU]/mL
QuantiFERON Nil Value: 0.13 [IU]/mL
QuantiFERON TB1 Ag Value: 0.13 [IU]/mL
QuantiFERON TB2 Ag Value: 0.14 [IU]/mL

## 2023-12-03 LAB — QUANTIFERON-TB GOLD PLUS: QuantiFERON-TB Gold Plus: NEGATIVE

## 2023-12-03 MED ORDER — OXYCODONE HCL 5 MG PO TABS: 2.5000 mg | ORAL_TABLET | ORAL | 0 refills | Status: AC | PRN

## 2023-12-03 MED ORDER — OXYCODONE HCL 5 MG PO TABS: 2.5000 mg | ORAL_TABLET | Freq: Four times a day (QID) | ORAL | 0 refills | Status: DC | PRN

## 2023-12-03 MED ORDER — MAGNESIUM HYDROXIDE 400 MG/5ML PO SUSP
30.0000 mL | Freq: Once | ORAL | Status: AC
  Administered 2023-12-03: 30 mL via ORAL
  Filled 2023-12-03: qty 30

## 2023-12-03 MED ORDER — SENNA 8.6 MG PO TABS
2.0000 | ORAL_TABLET | Freq: Once | ORAL | Status: AC
  Administered 2023-12-03: 17.2 mg via ORAL
  Filled 2023-12-03: qty 2

## 2023-12-03 MED ORDER — ACETAMINOPHEN 500 MG PO TABS: 1000.0000 mg | ORAL_TABLET | Freq: Four times a day (QID) | ORAL | Status: AC | PRN

## 2023-12-03 MED ORDER — POLYETHYLENE GLYCOL 3350 17 G PO PACK: 17.0000 g | PACK | Freq: Two times a day (BID) | ORAL | Status: AC

## 2023-12-03 MED ORDER — DOCUSATE SODIUM 100 MG PO CAPS: 100.0000 mg | ORAL_CAPSULE | Freq: Two times a day (BID) | ORAL | 0 refills | Status: AC

## 2023-12-03 MED ORDER — POLYETHYLENE GLYCOL 3350 17 G PO PACK
17.0000 g | PACK | Freq: Two times a day (BID) | ORAL | Status: DC
  Administered 2023-12-03: 17 g via ORAL
  Filled 2023-12-03: qty 1

## 2023-12-03 NOTE — Progress Notes (Signed)
 Patient discharged to ALF. AVS reviewed with patient's son. Printed prescriptions provided. IV removed, site pink and edematous, without pain. PA aware.

## 2023-12-03 NOTE — TOC Transition Note (Signed)
 Transition of Care [facility]) - Discharge Note   Patient Details  Name: [name] MRN: [id number] Date of Birth: [birth year]  Transition of Care [facility]) CM/SW Contact:  Kervens Roper M, RN Phone Number: 12/03/2023, 10:51am  Clinical Narrative:    Quantiferon TB test results back and negative; notified Melissa at [facility].  Will email results, per her request. Son plans to transport patient by private vehicle.  Notified attending of lab results and need for discharge summary and order.    Addendum: 2:51pm Discharge summary available and signed by MD; emailed to Eleanor Mascot in admissions at facility.  Per admissions, bedside nurse does not need to call report to receiving nurse.   Final next level of care: Assisted Living Barriers to Discharge: Barriers Resolved   Patient Goals and CMS Choice   CMS Medicare.gov Compare Post Acute Care list provided to:: Patient Represenative (must comment) Choice offered to / list presented to : Adult Children      Discharge Placement  [facility]                     Discharge Plan and Services Additional resources added to the After Visit Summary for  NA   Discharge Planning Services: CM Consult                      HH Arranged: PT          Social Drivers of Health (SDOH) Interventions SDOH Screenings   Food Insecurity: Patient Unable To Answer (11/26/2023)  Housing: Patient Unable To Answer (11/26/2023)  Transportation Needs: Patient Unable To Answer (11/26/2023)  Utilities: Patient Unable To Answer (11/26/2023)  Depression (PHQ2-9): High Risk (05/13/2022)  Social Connections: Patient Unable To Answer (11/26/2023)  Tobacco Use: Medium Risk (05/15/2022)     Readmission Risk Interventions    05/20/2022   12:13 PM  Readmission Risk Prevention Plan  Transportation Screening Complete  Home Care Screening Complete   Mliss MICAEL Fass, RN, BSN  Trauma/Neuro ICU Case  Manager [phone]

## 2023-12-03 NOTE — Discharge Summary (Deleted)
 [facility] Discharge Summary   Patient ID: [name] MRN: [id number] DOB/AGE: Dec 19, 1940 83 y.o.  Admit date: 11/25/2023 Discharge date: 12/03/2023  Admitting Diagnosis: Fall TBI  Frontal intracranial hemorrhage Facial hematoma   Discharge Diagnosis Patient Active Problem List   Diagnosis Date Noted   ICH (intracerebral hemorrhage) (HCC) 11/25/2023   Syncope 05/16/2022   Syncope and collapse 05/15/2022   Failure to thrive  in adult 05/15/2022   Anemia due to acquired thiamine  deficiency 05/13/2022   Diabetes mellitus (HCC) 05/13/2022   Current severe episode of major depressive disorder without psychotic features without prior episode (HCC) 05/13/2022   Pressure injury of skin 05/01/2022   Complete heart block (HCC) 04/29/2022   Hematuria 04/28/2022   Protein-calorie malnutrition, severe (HCC) 04/28/2022   Depression 04/28/2022   Tobacco abuse 04/28/2022   Hypokalemia 10/04/2021   Bilateral lower extremity edema 10/04/2021   Aortic atherosclerosis (HCC) 09/13/2021   Chronic hypoxemic respiratory failure (HCC) 08/16/2021   Carotid stenosis, asymptomatic, bilateral 12/08/2017   Demand ischemia of myocardium (HCC)    Essential hypertension    COPD III/ hypercarbic 10/07/2016   Chronic respiratory failure with hypercapnia (HCC) 10/07/2016   Pure hyperglyceridemia 09/04/2016   Moderate persistent asthma without complication 09/04/2016   Essential hypertension, benign 10/10/2009   Hyperlipidemia LDL goal <70 10/07/2008   CORONARY ATHEROSCLEROSIS NATIVE CORONARY ARTERY 10/07/2008   Cerebrovascular disease 10/07/2008    Consultants Neurosurgery   Imaging: No results found.  Procedures none  Hospital Course:   HPI: 33M s/p fall in the bathroom. Patient unable to participate much in history and unable to tell me whether he slipped/tripped or had a syncopal fall. Unable to tell me if he had LOC. Trauma sustained to L side of head.    Per EDP and  patient's son, patient is DNR/DNI and on hospice for end-stage COPD but not on O2 on my exam and patient reports he is not on home O2.   Below is a complete list of the patients injuries along with their management:  Frontal ICH - NSGY c/s, Dr. Lanis, per EDP recs for no repeat CT, defer keppra given size of bleed; cog eval, recommend ongoing SLP follow up.  Frontal hematoma - stable, not expanding Forehead laceration - s/p repair with 5, 4-0 prolene sutures by EDP. Sutures removed 2/4.  FEN: reg, BID stool softeners, BID miralax , PRN mag citrate ID: none VTE: SCD's, LMWH Foley: spont voids  On 2/5 the patients vitals were stable, pain controlled, working with therapies and stable for discharge to [facility].   I have personally reviewed the patients medication history on the [place] controlled substance database.  I did not personally evaluate this patient on the day of discharge, therefore the above information was obtained entirely from chart review.   Allergies as of 12/03/2023       Reactions   Penicillins Hives, Swelling        Medication List     TAKE these medications    acetaminophen  500 MG tablet Commonly known as: TYLENOL  Take 2 tablets (1,000 mg total) by mouth every 6 (six) hours as needed.   docusate sodium  100 MG capsule Commonly known as: COLACE Take 1 capsule (100 mg total) by mouth 2 (two) times daily.   oxyCODONE  5 MG immediate release tablet Commonly known as: Oxy IR/ROXICODONE  Take 0.5-1 tablets (2.5-5 mg total) by mouth every 6 (six) hours as needed for severe pain (pain score 7-10).   polyethylene glycol 17  g packet Commonly known as: MIRALAX  / GLYCOLAX  Take 17 g by mouth 2 (two) times daily.   PRESCRIPTION MEDICATION Take 1 tablet by mouth daily as needed (sinus congestion). Rx medication for sinus congestion   PRESCRIPTION MEDICATION Take 1 tablet by mouth daily as needed (runny nose). Rx medication to dry sinuses           Follow-up Information     [facility] Follow up.   Why: Resume services on return home                Signed: Almarie Pringle, [facility] 12/03/2023, 11:07 AM

## 2023-12-03 NOTE — Discharge Summary (Signed)
 [facility] Discharge Summary   Patient ID: [name] MRN: [id number] DOB/AGE: [birth year] 83 y.o.  Admit date: 11/25/2023 Discharge date: 12/03/2023  Admitting Diagnosis:  Fall TBI  Frontal intracranial hemorrhage Facial hematoma    Discharge Diagnosis Patient Active Problem List   Diagnosis Date Noted   ICH (intracerebral hemorrhage) (HCC) 11/25/2023   Syncope 05/16/2022   Syncope and collapse 05/15/2022   Failure to thrive  in adult 05/15/2022   Anemia due to acquired thiamine  deficiency 05/13/2022   Diabetes mellitus (HCC) 05/13/2022   Current severe episode of major depressive disorder without psychotic features without prior episode (HCC) 05/13/2022   Pressure injury of skin 05/01/2022   Complete heart block (HCC) 04/29/2022   Hematuria 04/28/2022   Protein-calorie malnutrition, severe (HCC) 04/28/2022   Depression 04/28/2022   Tobacco abuse 04/28/2022   Hypokalemia 10/04/2021   Bilateral lower extremity edema 10/04/2021   Aortic atherosclerosis (HCC) 09/13/2021   Chronic hypoxemic respiratory failure (HCC) 08/16/2021   Carotid stenosis, asymptomatic, bilateral 12/08/2017   Demand ischemia of myocardium (HCC)    Essential hypertension    COPD III/ hypercarbic 10/07/2016   Chronic respiratory failure with hypercapnia (HCC) 10/07/2016   Pure hyperglyceridemia 09/04/2016   Moderate persistent asthma without complication 09/04/2016   Essential hypertension, benign 10/10/2009   Hyperlipidemia LDL goal <70 10/07/2008   CORONARY ATHEROSCLEROSIS NATIVE CORONARY ARTERY 10/07/2008   Cerebrovascular disease 10/07/2008    Consultants Neurosurgery  Imaging: No results found.  Procedures none  Hospital Course:  HPI: 6M s/p fall in the bathroom. Patient unable to participate much in history and unable to tell me whether he slipped/tripped or had a syncopal fall. Unable to tell me if he had LOC. Trauma sustained to L side of head.    Per EDP and  patient's son, patient is DNR/DNI and on hospice for end-stage COPD but not on O2 on my exam and patient reports he is not on home O2.    Below is a complete list of the patients injuries along with their management:  Frontal ICH - NSGY c/s, Dr. Lanis, per EDP recs for no repeat CT, defer keppra given size of bleed; cog eval, recommend ongoing SLP follow up.  Frontal hematoma - stable, not expanding Forehead laceration - s/p repair with 5, 4-0 prolene sutures by EDP. Sutures removed 2/4.   FEN: reg, BID stool softeners, BID miralax , PRN mag citrate ID: none VTE: SCD's, LMWH Foley: spont voids   On 2/5 the patients vitals were stable, pain controlled, working with therapies and stable for discharge to [facility].    I have personally reviewed the patients medication history on the [place] controlled substance database.   I did not personally evaluate this patient on the day of discharge, therefore the above information was obtained entirely from chart review.     Allergies as of 12/03/2023       Reactions   Penicillins Hives, Swelling        Medication List     TAKE these medications    acetaminophen  500 MG tablet Commonly known as: TYLENOL  Take 2 tablets (1,000 mg total) by mouth every 6 (six) hours as needed.   docusate sodium  100 MG capsule Commonly known as: COLACE Take 1 capsule (100 mg total) by mouth 2 (two) times daily.   oxyCODONE  5 MG immediate release tablet Commonly known as: Oxy IR/ROXICODONE  Take 0.5 tablets (2.5 mg total) by mouth every 4 (four) hours as needed for severe pain (pain  score 7-10).   polyethylene glycol 17 g packet Commonly known as: MIRALAX  / GLYCOLAX  Take 17 g by mouth 2 (two) times daily.   PRESCRIPTION MEDICATION Take 1 tablet by mouth daily as needed (sinus congestion). Rx medication for sinus congestion   PRESCRIPTION MEDICATION Take 1 tablet by mouth daily as needed (runny nose). Rx medication to dry  sinuses          Follow-up Information     [facility] Follow up.   Why: Resume services on return home        Joshua Debby CROME, MD. Schedule an appointment as soon as possible for a visit.   Specialty: Internal Medicine Why: 1-2 weeks for post-hospitalization follow up Contact information: [address] [place] [address] [phone]         [facility]. Call.   Why: As needed Contact information: [address] [place]  [address] [phone]                Signed: Almarie Pringle, [facility] 12/03/2023, 2:54 PM

## 2023-12-03 NOTE — Progress Notes (Signed)
   Trauma/Critical Care Follow Up Note  Subjective:    Overnight Issues:   Objective:  Vital signs for last 24 hours: Temp:  [97.6 F (36.4 C)-98.5 F (36.9 C)] 97.7 F (36.5 C) (02/05 0754) Pulse Rate:  [81-85] 81 (02/05 0754) Resp:  [13-17] 14 (02/05 0754) BP: (94-139)/(57-79) 117/57 (02/05 0754) SpO2:  [92 %-100 %] 95 % (02/05 0754)  Hemodynamic parameters for last 24 hours:    Intake/Output from previous day: 02/04 0701 - 02/05 0700 In: 320 [P.O.:320] Out: -   Intake/Output this shift: No intake/output data recorded.  Vent settings for last 24 hours:    Physical Exam:  Gen: comfortable, no distress Neuro: follows commands, alert, communicative HEENT: PERRL Neck: supple CV: RRR Pulm: unlabored breathing on RA Abd: soft, NT   , no recent BM GU: urine clear and yellow, +spontaneous voids Extr: wwp, no edema  No results found for this or any previous visit (from the past 24 hours).  Assessment & Plan:  Present on Admission:  ICH (intracerebral hemorrhage) (HCC)    LOS: 5 days   Additional comments:I reviewed the patient's new clinical lab test results.   and I reviewed the patients new imaging test results.    Frontal ICH - NSGY c/s, Dr. Lanis, per EDP recs for no repeat CT, defer keppra given size of bleed; cog eval, recommend ongoing SLP follow up.  Frontal hematoma  Forehead laceration - s/p repair with 5, 4-0 prolene sutures by EDP. Will need removal in 7 days - today. FEN: reg, escalate bowel regimen ID: none VTE: SCD's, LMWH Foley: spont voids Dispo: [facility] once cleared by their facility. Medically stable for discharge. Await quantiferon result  Dreama GEANNIE Hanger, MD Trauma & General Surgery Please use [email or web address] to contact on call provider  12/03/2023  *Care during the described time interval was provided by me. I have reviewed this patient's available data, including medical history, events of note, physical examination and test  results as part of my evaluation.

## 2023-12-05 DIAGNOSIS — J449 Chronic obstructive pulmonary disease, unspecified: Secondary | ICD-10-CM | POA: Diagnosis not present

## 2023-12-05 DIAGNOSIS — R55 Syncope and collapse: Secondary | ICD-10-CM | POA: Diagnosis not present

## 2023-12-05 DIAGNOSIS — E43 Unspecified severe protein-calorie malnutrition: Secondary | ICD-10-CM | POA: Diagnosis not present

## 2023-12-05 DIAGNOSIS — I2510 Atherosclerotic heart disease of native coronary artery without angina pectoris: Secondary | ICD-10-CM | POA: Diagnosis not present

## 2023-12-05 DIAGNOSIS — E119 Type 2 diabetes mellitus without complications: Secondary | ICD-10-CM | POA: Diagnosis not present

## 2023-12-05 DIAGNOSIS — Z66 Do not resuscitate: Secondary | ICD-10-CM | POA: Diagnosis not present

## 2023-12-05 DIAGNOSIS — I679 Cerebrovascular disease, unspecified: Secondary | ICD-10-CM | POA: Diagnosis not present

## 2023-12-05 DIAGNOSIS — R296 Repeated falls: Secondary | ICD-10-CM | POA: Diagnosis not present

## 2023-12-05 DIAGNOSIS — I119 Hypertensive heart disease without heart failure: Secondary | ICD-10-CM | POA: Diagnosis not present

## 2023-12-05 DIAGNOSIS — I619 Nontraumatic intracerebral hemorrhage, unspecified: Secondary | ICD-10-CM | POA: Diagnosis not present

## 2023-12-05 DIAGNOSIS — F32A Depression, unspecified: Secondary | ICD-10-CM | POA: Diagnosis not present

## 2023-12-07 DIAGNOSIS — I119 Hypertensive heart disease without heart failure: Secondary | ICD-10-CM | POA: Diagnosis not present

## 2023-12-07 DIAGNOSIS — E43 Unspecified severe protein-calorie malnutrition: Secondary | ICD-10-CM | POA: Diagnosis not present

## 2023-12-07 DIAGNOSIS — I2510 Atherosclerotic heart disease of native coronary artery without angina pectoris: Secondary | ICD-10-CM | POA: Diagnosis not present

## 2023-12-07 DIAGNOSIS — J449 Chronic obstructive pulmonary disease, unspecified: Secondary | ICD-10-CM | POA: Diagnosis not present

## 2023-12-07 DIAGNOSIS — I679 Cerebrovascular disease, unspecified: Secondary | ICD-10-CM | POA: Diagnosis not present

## 2023-12-07 DIAGNOSIS — E119 Type 2 diabetes mellitus without complications: Secondary | ICD-10-CM | POA: Diagnosis not present

## 2023-12-08 DIAGNOSIS — I119 Hypertensive heart disease without heart failure: Secondary | ICD-10-CM | POA: Diagnosis not present

## 2023-12-08 DIAGNOSIS — E43 Unspecified severe protein-calorie malnutrition: Secondary | ICD-10-CM | POA: Diagnosis not present

## 2023-12-08 DIAGNOSIS — I679 Cerebrovascular disease, unspecified: Secondary | ICD-10-CM | POA: Diagnosis not present

## 2023-12-08 DIAGNOSIS — I2510 Atherosclerotic heart disease of native coronary artery without angina pectoris: Secondary | ICD-10-CM | POA: Diagnosis not present

## 2023-12-08 DIAGNOSIS — E119 Type 2 diabetes mellitus without complications: Secondary | ICD-10-CM | POA: Diagnosis not present

## 2023-12-08 DIAGNOSIS — J449 Chronic obstructive pulmonary disease, unspecified: Secondary | ICD-10-CM | POA: Diagnosis not present

## 2023-12-09 DIAGNOSIS — E43 Unspecified severe protein-calorie malnutrition: Secondary | ICD-10-CM | POA: Diagnosis not present

## 2023-12-09 DIAGNOSIS — I2510 Atherosclerotic heart disease of native coronary artery without angina pectoris: Secondary | ICD-10-CM | POA: Diagnosis not present

## 2023-12-09 DIAGNOSIS — J449 Chronic obstructive pulmonary disease, unspecified: Secondary | ICD-10-CM | POA: Diagnosis not present

## 2023-12-09 DIAGNOSIS — I679 Cerebrovascular disease, unspecified: Secondary | ICD-10-CM | POA: Diagnosis not present

## 2023-12-09 DIAGNOSIS — I119 Hypertensive heart disease without heart failure: Secondary | ICD-10-CM | POA: Diagnosis not present

## 2023-12-09 DIAGNOSIS — E119 Type 2 diabetes mellitus without complications: Secondary | ICD-10-CM | POA: Diagnosis not present

## 2023-12-10 DIAGNOSIS — I119 Hypertensive heart disease without heart failure: Secondary | ICD-10-CM | POA: Diagnosis not present

## 2023-12-10 DIAGNOSIS — I679 Cerebrovascular disease, unspecified: Secondary | ICD-10-CM | POA: Diagnosis not present

## 2023-12-10 DIAGNOSIS — J449 Chronic obstructive pulmonary disease, unspecified: Secondary | ICD-10-CM | POA: Diagnosis not present

## 2023-12-10 DIAGNOSIS — I2510 Atherosclerotic heart disease of native coronary artery without angina pectoris: Secondary | ICD-10-CM | POA: Diagnosis not present

## 2023-12-10 DIAGNOSIS — E119 Type 2 diabetes mellitus without complications: Secondary | ICD-10-CM | POA: Diagnosis not present

## 2023-12-10 DIAGNOSIS — E43 Unspecified severe protein-calorie malnutrition: Secondary | ICD-10-CM | POA: Diagnosis not present

## 2023-12-11 DIAGNOSIS — I679 Cerebrovascular disease, unspecified: Secondary | ICD-10-CM | POA: Diagnosis not present

## 2023-12-11 DIAGNOSIS — E43 Unspecified severe protein-calorie malnutrition: Secondary | ICD-10-CM | POA: Diagnosis not present

## 2023-12-11 DIAGNOSIS — E119 Type 2 diabetes mellitus without complications: Secondary | ICD-10-CM | POA: Diagnosis not present

## 2023-12-11 DIAGNOSIS — J449 Chronic obstructive pulmonary disease, unspecified: Secondary | ICD-10-CM | POA: Diagnosis not present

## 2023-12-11 DIAGNOSIS — I119 Hypertensive heart disease without heart failure: Secondary | ICD-10-CM | POA: Diagnosis not present

## 2023-12-11 DIAGNOSIS — I2510 Atherosclerotic heart disease of native coronary artery without angina pectoris: Secondary | ICD-10-CM | POA: Diagnosis not present

## 2023-12-13 DIAGNOSIS — I119 Hypertensive heart disease without heart failure: Secondary | ICD-10-CM | POA: Diagnosis not present

## 2023-12-13 DIAGNOSIS — I2510 Atherosclerotic heart disease of native coronary artery without angina pectoris: Secondary | ICD-10-CM | POA: Diagnosis not present

## 2023-12-13 DIAGNOSIS — I679 Cerebrovascular disease, unspecified: Secondary | ICD-10-CM | POA: Diagnosis not present

## 2023-12-13 DIAGNOSIS — E119 Type 2 diabetes mellitus without complications: Secondary | ICD-10-CM | POA: Diagnosis not present

## 2023-12-13 DIAGNOSIS — E43 Unspecified severe protein-calorie malnutrition: Secondary | ICD-10-CM | POA: Diagnosis not present

## 2023-12-13 DIAGNOSIS — J449 Chronic obstructive pulmonary disease, unspecified: Secondary | ICD-10-CM | POA: Diagnosis not present

## 2023-12-15 DIAGNOSIS — I679 Cerebrovascular disease, unspecified: Secondary | ICD-10-CM | POA: Diagnosis not present

## 2023-12-15 DIAGNOSIS — J449 Chronic obstructive pulmonary disease, unspecified: Secondary | ICD-10-CM | POA: Diagnosis not present

## 2023-12-15 DIAGNOSIS — E43 Unspecified severe protein-calorie malnutrition: Secondary | ICD-10-CM | POA: Diagnosis not present

## 2023-12-15 DIAGNOSIS — E119 Type 2 diabetes mellitus without complications: Secondary | ICD-10-CM | POA: Diagnosis not present

## 2023-12-15 DIAGNOSIS — I2510 Atherosclerotic heart disease of native coronary artery without angina pectoris: Secondary | ICD-10-CM | POA: Diagnosis not present

## 2023-12-15 DIAGNOSIS — I119 Hypertensive heart disease without heart failure: Secondary | ICD-10-CM | POA: Diagnosis not present

## 2023-12-16 DIAGNOSIS — I119 Hypertensive heart disease without heart failure: Secondary | ICD-10-CM | POA: Diagnosis not present

## 2023-12-16 DIAGNOSIS — J449 Chronic obstructive pulmonary disease, unspecified: Secondary | ICD-10-CM | POA: Diagnosis not present

## 2023-12-16 DIAGNOSIS — I2510 Atherosclerotic heart disease of native coronary artery without angina pectoris: Secondary | ICD-10-CM | POA: Diagnosis not present

## 2023-12-16 DIAGNOSIS — E43 Unspecified severe protein-calorie malnutrition: Secondary | ICD-10-CM | POA: Diagnosis not present

## 2023-12-16 DIAGNOSIS — E119 Type 2 diabetes mellitus without complications: Secondary | ICD-10-CM | POA: Diagnosis not present

## 2023-12-16 DIAGNOSIS — E785 Hyperlipidemia, unspecified: Secondary | ICD-10-CM | POA: Diagnosis not present

## 2023-12-16 DIAGNOSIS — I679 Cerebrovascular disease, unspecified: Secondary | ICD-10-CM | POA: Diagnosis not present

## 2023-12-17 DIAGNOSIS — Z87891 Personal history of nicotine dependence: Secondary | ICD-10-CM | POA: Diagnosis not present

## 2023-12-17 DIAGNOSIS — F0390 Unspecified dementia without behavioral disturbance: Secondary | ICD-10-CM | POA: Diagnosis not present

## 2023-12-17 DIAGNOSIS — J9611 Chronic respiratory failure with hypoxia: Secondary | ICD-10-CM | POA: Diagnosis not present

## 2023-12-17 DIAGNOSIS — I679 Cerebrovascular disease, unspecified: Secondary | ICD-10-CM | POA: Diagnosis not present

## 2023-12-17 DIAGNOSIS — J449 Chronic obstructive pulmonary disease, unspecified: Secondary | ICD-10-CM | POA: Diagnosis not present

## 2023-12-17 DIAGNOSIS — I442 Atrioventricular block, complete: Secondary | ICD-10-CM | POA: Diagnosis not present

## 2023-12-17 DIAGNOSIS — E43 Unspecified severe protein-calorie malnutrition: Secondary | ICD-10-CM | POA: Diagnosis not present

## 2023-12-17 DIAGNOSIS — W010XXA Fall on same level from slipping, tripping and stumbling without subsequent striking against object, initial encounter: Secondary | ICD-10-CM | POA: Diagnosis not present

## 2023-12-17 DIAGNOSIS — I2510 Atherosclerotic heart disease of native coronary artery without angina pectoris: Secondary | ICD-10-CM | POA: Diagnosis not present

## 2023-12-17 DIAGNOSIS — E46 Unspecified protein-calorie malnutrition: Secondary | ICD-10-CM | POA: Diagnosis not present

## 2023-12-17 DIAGNOSIS — Z1331 Encounter for screening for depression: Secondary | ICD-10-CM | POA: Diagnosis not present

## 2023-12-17 DIAGNOSIS — I119 Hypertensive heart disease without heart failure: Secondary | ICD-10-CM | POA: Diagnosis not present

## 2023-12-17 DIAGNOSIS — E119 Type 2 diabetes mellitus without complications: Secondary | ICD-10-CM | POA: Diagnosis not present

## 2023-12-17 NOTE — Progress Notes (Signed)
In response to coding query, the laceration depth was Laceration repair of face down to and including subcutaneous tissue/fascia

## 2023-12-22 DIAGNOSIS — E119 Type 2 diabetes mellitus without complications: Secondary | ICD-10-CM | POA: Diagnosis not present

## 2023-12-22 DIAGNOSIS — I2510 Atherosclerotic heart disease of native coronary artery without angina pectoris: Secondary | ICD-10-CM | POA: Diagnosis not present

## 2023-12-22 DIAGNOSIS — I119 Hypertensive heart disease without heart failure: Secondary | ICD-10-CM | POA: Diagnosis not present

## 2023-12-22 DIAGNOSIS — E43 Unspecified severe protein-calorie malnutrition: Secondary | ICD-10-CM | POA: Diagnosis not present

## 2023-12-22 DIAGNOSIS — I679 Cerebrovascular disease, unspecified: Secondary | ICD-10-CM | POA: Diagnosis not present

## 2023-12-22 DIAGNOSIS — J449 Chronic obstructive pulmonary disease, unspecified: Secondary | ICD-10-CM | POA: Diagnosis not present

## 2023-12-24 DIAGNOSIS — E119 Type 2 diabetes mellitus without complications: Secondary | ICD-10-CM | POA: Diagnosis not present

## 2023-12-24 DIAGNOSIS — E43 Unspecified severe protein-calorie malnutrition: Secondary | ICD-10-CM | POA: Diagnosis not present

## 2023-12-24 DIAGNOSIS — J449 Chronic obstructive pulmonary disease, unspecified: Secondary | ICD-10-CM | POA: Diagnosis not present

## 2023-12-24 DIAGNOSIS — F0280 Dementia in other diseases classified elsewhere without behavioral disturbance: Secondary | ICD-10-CM | POA: Diagnosis not present

## 2023-12-24 DIAGNOSIS — I2510 Atherosclerotic heart disease of native coronary artery without angina pectoris: Secondary | ICD-10-CM | POA: Diagnosis not present

## 2023-12-24 DIAGNOSIS — F0150 Vascular dementia without behavioral disturbance: Secondary | ICD-10-CM | POA: Diagnosis not present

## 2023-12-24 DIAGNOSIS — I679 Cerebrovascular disease, unspecified: Secondary | ICD-10-CM | POA: Diagnosis not present

## 2023-12-24 DIAGNOSIS — I119 Hypertensive heart disease without heart failure: Secondary | ICD-10-CM | POA: Diagnosis not present

## 2023-12-24 DIAGNOSIS — R296 Repeated falls: Secondary | ICD-10-CM | POA: Diagnosis not present

## 2023-12-24 DIAGNOSIS — I10 Essential (primary) hypertension: Secondary | ICD-10-CM | POA: Diagnosis not present

## 2023-12-27 DIAGNOSIS — I619 Nontraumatic intracerebral hemorrhage, unspecified: Secondary | ICD-10-CM | POA: Diagnosis not present

## 2023-12-27 DIAGNOSIS — R55 Syncope and collapse: Secondary | ICD-10-CM | POA: Diagnosis not present

## 2023-12-27 DIAGNOSIS — R296 Repeated falls: Secondary | ICD-10-CM | POA: Diagnosis not present

## 2023-12-27 DIAGNOSIS — E43 Unspecified severe protein-calorie malnutrition: Secondary | ICD-10-CM | POA: Diagnosis not present

## 2023-12-27 DIAGNOSIS — I2510 Atherosclerotic heart disease of native coronary artery without angina pectoris: Secondary | ICD-10-CM | POA: Diagnosis not present

## 2023-12-27 DIAGNOSIS — J449 Chronic obstructive pulmonary disease, unspecified: Secondary | ICD-10-CM | POA: Diagnosis not present

## 2023-12-27 DIAGNOSIS — E119 Type 2 diabetes mellitus without complications: Secondary | ICD-10-CM | POA: Diagnosis not present

## 2023-12-27 DIAGNOSIS — I679 Cerebrovascular disease, unspecified: Secondary | ICD-10-CM | POA: Diagnosis not present

## 2023-12-27 DIAGNOSIS — I119 Hypertensive heart disease without heart failure: Secondary | ICD-10-CM | POA: Diagnosis not present

## 2023-12-27 DIAGNOSIS — Z66 Do not resuscitate: Secondary | ICD-10-CM | POA: Diagnosis not present

## 2023-12-27 DIAGNOSIS — F32A Depression, unspecified: Secondary | ICD-10-CM | POA: Diagnosis not present

## 2023-12-29 DIAGNOSIS — E119 Type 2 diabetes mellitus without complications: Secondary | ICD-10-CM | POA: Diagnosis not present

## 2023-12-29 DIAGNOSIS — I679 Cerebrovascular disease, unspecified: Secondary | ICD-10-CM | POA: Diagnosis not present

## 2023-12-29 DIAGNOSIS — J449 Chronic obstructive pulmonary disease, unspecified: Secondary | ICD-10-CM | POA: Diagnosis not present

## 2023-12-29 DIAGNOSIS — I2510 Atherosclerotic heart disease of native coronary artery without angina pectoris: Secondary | ICD-10-CM | POA: Diagnosis not present

## 2023-12-29 DIAGNOSIS — I119 Hypertensive heart disease without heart failure: Secondary | ICD-10-CM | POA: Diagnosis not present

## 2023-12-29 DIAGNOSIS — E43 Unspecified severe protein-calorie malnutrition: Secondary | ICD-10-CM | POA: Diagnosis not present

## 2023-12-30 DIAGNOSIS — E119 Type 2 diabetes mellitus without complications: Secondary | ICD-10-CM | POA: Diagnosis not present

## 2023-12-30 DIAGNOSIS — I119 Hypertensive heart disease without heart failure: Secondary | ICD-10-CM | POA: Diagnosis not present

## 2023-12-30 DIAGNOSIS — I2510 Atherosclerotic heart disease of native coronary artery without angina pectoris: Secondary | ICD-10-CM | POA: Diagnosis not present

## 2023-12-30 DIAGNOSIS — I679 Cerebrovascular disease, unspecified: Secondary | ICD-10-CM | POA: Diagnosis not present

## 2023-12-30 DIAGNOSIS — E43 Unspecified severe protein-calorie malnutrition: Secondary | ICD-10-CM | POA: Diagnosis not present

## 2023-12-30 DIAGNOSIS — J449 Chronic obstructive pulmonary disease, unspecified: Secondary | ICD-10-CM | POA: Diagnosis not present

## 2023-12-31 ENCOUNTER — Encounter (HOSPITAL_COMMUNITY): Payer: Self-pay

## 2023-12-31 ENCOUNTER — Emergency Department (HOSPITAL_COMMUNITY)
Admission: EM | Admit: 2023-12-31 | Discharge: 2023-12-31 | Disposition: A | Discharge status: Home / Self Care | Attending: Emergency Medicine | Admitting: Emergency Medicine

## 2023-12-31 ENCOUNTER — Emergency Department (HOSPITAL_COMMUNITY)

## 2023-12-31 ENCOUNTER — Other Ambulatory Visit: Payer: Self-pay

## 2023-12-31 DIAGNOSIS — I10 Essential (primary) hypertension: Secondary | ICD-10-CM | POA: Diagnosis not present

## 2023-12-31 DIAGNOSIS — I119 Hypertensive heart disease without heart failure: Secondary | ICD-10-CM | POA: Diagnosis not present

## 2023-12-31 DIAGNOSIS — I252 Old myocardial infarction: Secondary | ICD-10-CM | POA: Insufficient documentation

## 2023-12-31 DIAGNOSIS — S060X0A Concussion without loss of consciousness, initial encounter: Secondary | ICD-10-CM | POA: Diagnosis not present

## 2023-12-31 DIAGNOSIS — S0181XA Laceration without foreign body of other part of head, initial encounter: Secondary | ICD-10-CM | POA: Insufficient documentation

## 2023-12-31 DIAGNOSIS — S0990XA Unspecified injury of head, initial encounter: Secondary | ICD-10-CM | POA: Diagnosis not present

## 2023-12-31 DIAGNOSIS — J45909 Unspecified asthma, uncomplicated: Secondary | ICD-10-CM | POA: Diagnosis not present

## 2023-12-31 DIAGNOSIS — W01198A Fall on same level from slipping, tripping and stumbling with subsequent striking against other object, initial encounter: Secondary | ICD-10-CM | POA: Diagnosis not present

## 2023-12-31 DIAGNOSIS — G9389 Other specified disorders of brain: Secondary | ICD-10-CM | POA: Diagnosis not present

## 2023-12-31 DIAGNOSIS — J449 Chronic obstructive pulmonary disease, unspecified: Secondary | ICD-10-CM | POA: Diagnosis not present

## 2023-12-31 DIAGNOSIS — F0390 Unspecified dementia without behavioral disturbance: Secondary | ICD-10-CM | POA: Insufficient documentation

## 2023-12-31 DIAGNOSIS — R531 Weakness: Secondary | ICD-10-CM | POA: Diagnosis not present

## 2023-12-31 DIAGNOSIS — W19XXXA Unspecified fall, initial encounter: Secondary | ICD-10-CM | POA: Diagnosis not present

## 2023-12-31 DIAGNOSIS — Z7401 Bed confinement status: Secondary | ICD-10-CM | POA: Diagnosis not present

## 2023-12-31 DIAGNOSIS — I2510 Atherosclerotic heart disease of native coronary artery without angina pectoris: Secondary | ICD-10-CM | POA: Diagnosis not present

## 2023-12-31 DIAGNOSIS — E43 Unspecified severe protein-calorie malnutrition: Secondary | ICD-10-CM | POA: Diagnosis not present

## 2023-12-31 DIAGNOSIS — I679 Cerebrovascular disease, unspecified: Secondary | ICD-10-CM | POA: Diagnosis not present

## 2023-12-31 DIAGNOSIS — Z23 Encounter for immunization: Secondary | ICD-10-CM | POA: Diagnosis not present

## 2023-12-31 DIAGNOSIS — R220 Localized swelling, mass and lump, head: Secondary | ICD-10-CM | POA: Diagnosis not present

## 2023-12-31 DIAGNOSIS — E119 Type 2 diabetes mellitus without complications: Secondary | ICD-10-CM | POA: Diagnosis not present

## 2023-12-31 MED ORDER — LIDOCAINE-EPINEPHRINE (PF) 2 %-1:200000 IJ SOLN
10.0000 mL | Freq: Once | INTRAMUSCULAR | Status: AC
  Administered 2023-12-31: 10 mL
  Filled 2023-12-31: qty 20

## 2023-12-31 MED ORDER — TETANUS-DIPHTH-ACELL PERTUSSIS 5-2.5-18.5 LF-MCG/0.5 IM SUSY
0.5000 mL | PREFILLED_SYRINGE | Freq: Once | INTRAMUSCULAR | Status: AC
  Administered 2023-12-31: 0.5 mL via INTRAMUSCULAR
  Filled 2023-12-31: qty 0.5

## 2023-12-31 MED ORDER — ACETAMINOPHEN 325 MG PO TABS
650.0000 mg | ORAL_TABLET | Freq: Once | ORAL | Status: AC
  Administered 2023-12-31: 650 mg via ORAL
  Filled 2023-12-31: qty 2

## 2023-12-31 NOTE — ED Provider Notes (Signed)
 [name] EMERGENCY DEPARTMENT AT [facility] Provider Note   CSN: [id number] Arrival date & time: 12/31/23  7829     History  Chief Complaint  Patient presents with   Fall  Laceration    [name] is a 83 y.o. male.  83 year old male presents today for concern of a mechanical fall that occurred last night.  He states he tripped over his walker.  His only complaint is a headache and he has a small laceration to the left side of his forehead.  No loss of consciousness.  He denies any other joint pain or any other injuries.  He states his last tetanus shot was over 7 years ago.  The history is provided by the patient. No language interpreter was used.       Home Medications Prior to Admission medications   Medication Sig Start Date End Date Taking? Authorizing Provider  acetaminophen (TYLENOL) 500 MG tablet Take 2 tablets (1,000 mg total) by mouth every 6 (six) hours as needed. 12/03/23   Adam Phenix, PA-C  docusate sodium (COLACE) 100 MG capsule Take 1 capsule (100 mg total) by mouth 2 (two) times daily. 12/03/23   Adam Phenix, PA-C  oxyCODONE (OXY IR/ROXICODONE) 5 MG immediate release tablet Take 0.5 tablets (2.5 mg total) by mouth every 4 (four) hours as needed for severe pain (pain score 7-10). 12/03/23   Simaan, Francine Graven, PA-C  polyethylene glycol (MIRALAX / GLYCOLAX) 17 g packet Take 17 g by mouth 2 (two) times daily. 12/03/23   Adam Phenix, PA-C  PRESCRIPTION MEDICATION Take 1 tablet by mouth daily as needed (sinus congestion). Rx medication for sinus congestion    [provider]  PRESCRIPTION MEDICATION Take 1 tablet by mouth daily as needed (runny nose). Rx medication to dry sinuses    [provider]      Allergies    Penicillins    Review of Systems   Review of Systems  Constitutional:  Negative for chills and fever.  Eyes:  Negative for visual disturbance.  Respiratory:  Negative for shortness of breath.   Skin:   Positive for wound.  Neurological:  Positive for headaches. Negative for light-headedness.  All other systems reviewed and are negative.   Physical Exam Updated Vital Signs BP 132/79 (BP Location: Right Arm)   Pulse 73   Temp 98.1 F (36.7 C) (Oral)   Resp 16   Ht 5\' 9"  (1.753 m)   Wt 63.5 kg   SpO2 100%   BMI 20.67 kg/m  Physical Exam Vitals and nursing note reviewed.  Constitutional:      General: He is not in acute distress.    Appearance: Normal appearance. He is not ill-appearing.  HENT:     Head: Normocephalic and atraumatic.     Nose: Nose normal.  Eyes:     Conjunctiva/sclera: Conjunctivae normal.  Pulmonary:     Effort: Pulmonary effort is normal. No respiratory distress.  Musculoskeletal:        General: No deformity. Normal range of motion.     Cervical back: Normal range of motion.  Skin:    Findings: No rash.     Comments: Small 1 cm laceration to the left side of forehead.  No active bleeding.  Neurological:     Mental Status: He is alert.     ED Results / Procedures / Treatments   Labs (all labs ordered are listed, but only abnormal results are displayed) Labs Reviewed -  No data to display  EKG None  Radiology CT HEAD WO CONTRAST ( ) Result Date: 12/31/2023 CLINICAL DATA:  83 year old male status post fall with left eyebrow laceration. EXAM: CT HEAD WITHOUT CONTRAST TECHNIQUE: Contiguous axial images were obtained from the base of the skull through the vertex without intravenous contrast. RADIATION DOSE REDUCTION: This exam was performed according to the departmental dose-optimization program which includes automated exposure control, adjustment of the mA and/or kV according to patient size and/or use of iterative reconstruction technique. COMPARISON:  Brain MRI 04/27/2022.  Head CT 11/25/2023 FINDINGS: Brain: Stable cerebral volume. No midline shift, ventriculomegaly, mass effect, evidence of mass lesion, or evidence of cortically based acute  infarction. Resolved small right middle frontal gyrus hemorrhage seen in January, with suggestion of hypodense encephalomalacia there now (coronal image 49). No acute intracranial hemorrhage identified. Stable basal ganglia vascular calcifications. Stable gray-white matter differentiation throughout the brain. Vascular: No suspicious intracranial vascular hyperdensity. Calcified atherosclerosis at the skull base. Skull: Stable.  No acute fracture identified. Sinuses/Orbits: Chronic right maxillary sinusitis, opacification. Other Visualized paranasal sinuses and mastoids are stable and well aerated. Other: Acute on previous left anterior convexity scalp soft tissue injury with new soft tissue gas and swelling superimposed on resolving hematoma there visible in January. Globes and intraorbital soft tissues appear negative. IMPRESSION: 1. Recurrent on previous left anterior convexity scalp soft tissue injury. No skull fracture identified. 2. Resolved small right frontal lobe contrecoup hemorrhage seen in January. Encephalomalacia suspected there now. 3. No acute intracranial abnormality. Electronically Signed   By: Odessa Fleming M.D.   On: 12/31/2023 05:18    Procedures .Marland KitchenLac repair Jersie Beel  Date/Time: 12/31/2023 8:39 AM  Performed by: Marita Kansas, PA-C Authorized by: Marita Kansas, PA-C   Consent:    Consent obtained:  Verbal   Consent given by:  Patient   Risks discussed:  Need for additional repair, infection, retained foreign body, poor cosmetic result and poor wound healing   Alternatives discussed:  No treatment Universal protocol:    Procedure explained and questions answered to patient or proxy's satisfaction: yes     Relevant documents present and verified: yes     Patient identity confirmed:  Verbally with patient and arm band Laceration details:    Length (cm):  1 Pre-procedure details:    Preparation:  Patient was prepped and draped in usual sterile fashion Exploration:    Imaging outcome: foreign  body not noted   Treatment:    Area cleansed with:  Saline   Amount of cleaning:  Extensive   Irrigation solution:  Sterile saline   Irrigation volume:  500   Irrigation method:  Tap   Debridement:  None   Undermining:  None Skin repair:    Repair method:  Steri-Strips   Number of Steri-Strips:  1 Approximation:    Approximation:  Close Repair type:    Repair type:  Simple Post-procedure details:    Dressing:  Non-adherent dressing   Procedure completion:  Tolerated well, no immediate complications     Medications Ordered in ED Medications  acetaminophen (TYLENOL) tablet 650 mg (650 mg Oral Given 12/31/23 0717)  Tdap (BOOSTRIX) injection 0.5 mL (0.5 mLs Intramuscular Given 12/31/23 0718)  lidocaine-EPINEPHrine (XYLOCAINE W/EPI) 2 %-1:200000 (PF) injection 10 mL (10 mLs Infiltration Given 12/31/23 0719)    ED Course/ Medical Decision Making/ A&P  Medical Decision Making Risk OTC drugs. Prescription drug management.   Medical Decision Making / ED Course   This patient presents to the ED for concern of head injury, this involves an extensive number of treatment options, and is a complaint that carries with it a high risk of complications and morbidity.  The differential diagnosis includes intracranial bleed, contusion, concussion, fracture  MDM: 83 year old male presents today for concern of head injury following a mechanical fall.  He denies any syncope or prodromal symptoms.  He states this was purely mechanical fall.  No loss consciousness.  He is overall well-appearing on exam.  Small laceration noted to the left side of forehead.  When attempting to clean this it was noted that this was mostly an abrasion no significant gaping or requiring laceration repair.  However Steri-Strip was used to approximate 1 end of it.  CT without acute concerns.  He is stable for discharge.  Concussion protocol discussed.  Patient is in agreement.  Discharged in  stable condition.  Lab Tests: -I ordered, reviewed, and interpreted labs.   The pertinent results include:   Labs Reviewed - No data to display    EKG  EKG Interpretation Date/Time:    Ventricular Rate:    PR Interval:    QRS Duration:    QT Interval:    QTC Calculation:   R Axis:      Text Interpretation:           Imaging Studies ordered: I ordered imaging studies including CT head I independently visualized and interpreted imaging. I agree with the radiologist interpretation   Medicines ordered and prescription drug management: Meds ordered this encounter  Medications   acetaminophen (TYLENOL) tablet 650 mg   Tdap (BOOSTRIX) injection 0.5 mL   lidocaine-EPINEPHrine (XYLOCAINE W/EPI) 2 %-1:200000 (PF) injection 10 mL    -I have reviewed the patients home medicines and have made adjustments as needed   Social Determinants of Health:  Factors impacting patients care include: Uses a walking device, lives in a assisted facility   Reevaluation: After the interventions noted above, I reevaluated the patient and found that they have :improved  Co morbidities that complicate the patient evaluation  Past Medical History:  Diagnosis Date   Asthma    as a child   Carotid arterial disease (HCC)    a. 11/2015 Carotid U/S: LICA 40-59%, RICA 100 CTO, nl subclavian arteries--f/u 1 yr.   Coronary atherosclerosis of native coronary artery    a. 09/2001 inflat MI/PCI: RCA 8m (3.0x18 AVE S7 BMS); b. 07/2006 Cath: LM nl, LAD 50p, LCX min irregs, OM1 large, min irregs, OM2 min irregs, RCA 20 ISR;  c. 10/2012 MV: EF 74%, no ischemia.   Essential hypertension    Heart murmur    since a child   Hyperlipidemia    Myocardial infarction [facility]) 2002   Pneumonia    age 1   Right inguinal hernia       Dispostion: Discharged in stable condition.  Return precaution discussed.  Patient voices understanding and is in agreement with plan.  @PCDICTATION @         Final  Clinical Impression(s) / ED Diagnoses Final diagnoses:  Injury of head, initial encounter  Laceration of forehead, initial encounter  Concussion without loss of consciousness, initial encounter    Rx / DC Orders ED Discharge Orders     None         Marita Kansas, PA-C 12/31/23 6295    Terrilee Files,  MD 12/31/23 1710

## 2023-12-31 NOTE — ED Notes (Signed)
 Called [facility] and gave report to Nurse Hilda Lias

## 2023-12-31 NOTE — ED Triage Notes (Addendum)
 Pt arrived from [facility] BIB [facility] for fall at 0154 tonight,  reported that he tripped over his walker and fell into the med cart hitting his left temple on the cart, NO LOC. Pt has a small laceration to left temple, bleeding controlled and hematoma noted. Pt is A&O at baseline, VSS.    Not on thinners, pt is also on hospice, DNR  EMS vitals 72 HR 150/76 96 % RA 18 RR 171 CBG

## 2023-12-31 NOTE — Addendum Note (Signed)
 Addended by: Elease Etienne A on: 12/31/2023 09:58 AM   Modules accepted: Orders

## 2023-12-31 NOTE — ED Notes (Signed)
 [facility] onsite to transport back to facility

## 2023-12-31 NOTE — ED Notes (Signed)
[facility] called, no eta 

## 2023-12-31 NOTE — Discharge Instructions (Signed)
 CT scan of your head did not show any concerning findings.  You did not have any significant laceration.  This was repaired with 1 Steri-Strip.  This will fall off on its own.  Follow-up with your primary care doctor.  You may have a concussion.  It is normal to have a headache for about a week with occasional nausea and vomiting.  If she noticed a severe headache, persistent nausea and vomiting, vision change please return for evaluation.

## 2023-12-31 NOTE — Progress Notes (Signed)
 Remote pacemaker transmission.

## 2023-12-31 NOTE — ED Provider Triage Note (Signed)
 Emergency Medicine Provider Triage Evaluation Note  [name] , a 83 y.o. male  was evaluated in triage.  Pt complains of head injury. Got tripped up over his walker and fell striking his head on a dresser. No significant pain. No associated LOC. Not on chronic anticoagulation. Is followed by hospice.  Review of Systems  Positive: As above Negative: LOC, N/V  Physical Exam  BP 132/79 (BP Location: Right Arm)   Pulse 73   Temp 98.1 F (36.7 C) (Oral)   Resp 16   Ht 5\' 9"  (1.753 m)   Wt 63.5 kg   SpO2 100%   BMI 20.67 kg/m  Gen:   Awake, no distress   Resp:  Normal effort  MSK:   Moves extremities without difficulty  Other:  Small contusion/hematoma to left frontotemporal scalp. Bleeding controlled. No skull instability.  Medical Decision Making  Medically screening exam initiated at 4:44 AM.  Appropriate orders placed.  [name] was informed that the remainder of the evaluation will be completed by another provider, this initial triage assessment does not replace that evaluation, and the importance of remaining in the ED until their evaluation is complete.  Head injury - laceration noted which will need repair. CT ordered to assess for intracranial injury.   Antony Madura, PA-C 12/31/23 [phone]

## 2024-01-01 DIAGNOSIS — J449 Chronic obstructive pulmonary disease, unspecified: Secondary | ICD-10-CM | POA: Diagnosis not present

## 2024-01-01 DIAGNOSIS — I2510 Atherosclerotic heart disease of native coronary artery without angina pectoris: Secondary | ICD-10-CM | POA: Diagnosis not present

## 2024-01-01 DIAGNOSIS — E119 Type 2 diabetes mellitus without complications: Secondary | ICD-10-CM | POA: Diagnosis not present

## 2024-01-01 DIAGNOSIS — I679 Cerebrovascular disease, unspecified: Secondary | ICD-10-CM | POA: Diagnosis not present

## 2024-01-01 DIAGNOSIS — I119 Hypertensive heart disease without heart failure: Secondary | ICD-10-CM | POA: Diagnosis not present

## 2024-01-01 DIAGNOSIS — E43 Unspecified severe protein-calorie malnutrition: Secondary | ICD-10-CM | POA: Diagnosis not present

## 2024-01-05 DIAGNOSIS — J449 Chronic obstructive pulmonary disease, unspecified: Secondary | ICD-10-CM | POA: Diagnosis not present

## 2024-01-05 DIAGNOSIS — E43 Unspecified severe protein-calorie malnutrition: Secondary | ICD-10-CM | POA: Diagnosis not present

## 2024-01-05 DIAGNOSIS — I119 Hypertensive heart disease without heart failure: Secondary | ICD-10-CM | POA: Diagnosis not present

## 2024-01-05 DIAGNOSIS — I2510 Atherosclerotic heart disease of native coronary artery without angina pectoris: Secondary | ICD-10-CM | POA: Diagnosis not present

## 2024-01-05 DIAGNOSIS — E119 Type 2 diabetes mellitus without complications: Secondary | ICD-10-CM | POA: Diagnosis not present

## 2024-01-05 DIAGNOSIS — I679 Cerebrovascular disease, unspecified: Secondary | ICD-10-CM | POA: Diagnosis not present

## 2024-01-06 DIAGNOSIS — I119 Hypertensive heart disease without heart failure: Secondary | ICD-10-CM | POA: Diagnosis not present

## 2024-01-06 DIAGNOSIS — E119 Type 2 diabetes mellitus without complications: Secondary | ICD-10-CM | POA: Diagnosis not present

## 2024-01-06 DIAGNOSIS — J449 Chronic obstructive pulmonary disease, unspecified: Secondary | ICD-10-CM | POA: Diagnosis not present

## 2024-01-06 DIAGNOSIS — E43 Unspecified severe protein-calorie malnutrition: Secondary | ICD-10-CM | POA: Diagnosis not present

## 2024-01-06 DIAGNOSIS — I2510 Atherosclerotic heart disease of native coronary artery without angina pectoris: Secondary | ICD-10-CM | POA: Diagnosis not present

## 2024-01-06 DIAGNOSIS — I679 Cerebrovascular disease, unspecified: Secondary | ICD-10-CM | POA: Diagnosis not present

## 2024-01-07 DIAGNOSIS — I119 Hypertensive heart disease without heart failure: Secondary | ICD-10-CM | POA: Diagnosis not present

## 2024-01-07 DIAGNOSIS — R296 Repeated falls: Secondary | ICD-10-CM | POA: Diagnosis not present

## 2024-01-07 DIAGNOSIS — F5105 Insomnia due to other mental disorder: Secondary | ICD-10-CM | POA: Diagnosis not present

## 2024-01-07 DIAGNOSIS — J449 Chronic obstructive pulmonary disease, unspecified: Secondary | ICD-10-CM | POA: Diagnosis not present

## 2024-01-07 DIAGNOSIS — E43 Unspecified severe protein-calorie malnutrition: Secondary | ICD-10-CM | POA: Diagnosis not present

## 2024-01-07 DIAGNOSIS — E119 Type 2 diabetes mellitus without complications: Secondary | ICD-10-CM | POA: Diagnosis not present

## 2024-01-07 DIAGNOSIS — F0280 Dementia in other diseases classified elsewhere without behavioral disturbance: Secondary | ICD-10-CM | POA: Diagnosis not present

## 2024-01-07 DIAGNOSIS — I679 Cerebrovascular disease, unspecified: Secondary | ICD-10-CM | POA: Diagnosis not present

## 2024-01-07 DIAGNOSIS — F0150 Vascular dementia without behavioral disturbance: Secondary | ICD-10-CM | POA: Diagnosis not present

## 2024-01-07 DIAGNOSIS — I2510 Atherosclerotic heart disease of native coronary artery without angina pectoris: Secondary | ICD-10-CM | POA: Diagnosis not present

## 2024-01-08 DIAGNOSIS — I2510 Atherosclerotic heart disease of native coronary artery without angina pectoris: Secondary | ICD-10-CM | POA: Diagnosis not present

## 2024-01-08 DIAGNOSIS — E43 Unspecified severe protein-calorie malnutrition: Secondary | ICD-10-CM | POA: Diagnosis not present

## 2024-01-08 DIAGNOSIS — I119 Hypertensive heart disease without heart failure: Secondary | ICD-10-CM | POA: Diagnosis not present

## 2024-01-08 DIAGNOSIS — E119 Type 2 diabetes mellitus without complications: Secondary | ICD-10-CM | POA: Diagnosis not present

## 2024-01-08 DIAGNOSIS — J449 Chronic obstructive pulmonary disease, unspecified: Secondary | ICD-10-CM | POA: Diagnosis not present

## 2024-01-08 DIAGNOSIS — I679 Cerebrovascular disease, unspecified: Secondary | ICD-10-CM | POA: Diagnosis not present

## 2024-01-09 DIAGNOSIS — E43 Unspecified severe protein-calorie malnutrition: Secondary | ICD-10-CM | POA: Diagnosis not present

## 2024-01-09 DIAGNOSIS — I679 Cerebrovascular disease, unspecified: Secondary | ICD-10-CM | POA: Diagnosis not present

## 2024-01-09 DIAGNOSIS — J449 Chronic obstructive pulmonary disease, unspecified: Secondary | ICD-10-CM | POA: Diagnosis not present

## 2024-01-09 DIAGNOSIS — I119 Hypertensive heart disease without heart failure: Secondary | ICD-10-CM | POA: Diagnosis not present

## 2024-01-09 DIAGNOSIS — I2510 Atherosclerotic heart disease of native coronary artery without angina pectoris: Secondary | ICD-10-CM | POA: Diagnosis not present

## 2024-01-09 DIAGNOSIS — E119 Type 2 diabetes mellitus without complications: Secondary | ICD-10-CM | POA: Diagnosis not present

## 2024-01-11 DIAGNOSIS — I119 Hypertensive heart disease without heart failure: Secondary | ICD-10-CM | POA: Diagnosis not present

## 2024-01-11 DIAGNOSIS — E43 Unspecified severe protein-calorie malnutrition: Secondary | ICD-10-CM | POA: Diagnosis not present

## 2024-01-11 DIAGNOSIS — I679 Cerebrovascular disease, unspecified: Secondary | ICD-10-CM | POA: Diagnosis not present

## 2024-01-11 DIAGNOSIS — J449 Chronic obstructive pulmonary disease, unspecified: Secondary | ICD-10-CM | POA: Diagnosis not present

## 2024-01-11 DIAGNOSIS — E119 Type 2 diabetes mellitus without complications: Secondary | ICD-10-CM | POA: Diagnosis not present

## 2024-01-11 DIAGNOSIS — I2510 Atherosclerotic heart disease of native coronary artery without angina pectoris: Secondary | ICD-10-CM | POA: Diagnosis not present

## 2024-01-12 DIAGNOSIS — I679 Cerebrovascular disease, unspecified: Secondary | ICD-10-CM | POA: Diagnosis not present

## 2024-01-12 DIAGNOSIS — J449 Chronic obstructive pulmonary disease, unspecified: Secondary | ICD-10-CM | POA: Diagnosis not present

## 2024-01-12 DIAGNOSIS — E119 Type 2 diabetes mellitus without complications: Secondary | ICD-10-CM | POA: Diagnosis not present

## 2024-01-12 DIAGNOSIS — I119 Hypertensive heart disease without heart failure: Secondary | ICD-10-CM | POA: Diagnosis not present

## 2024-01-12 DIAGNOSIS — I2510 Atherosclerotic heart disease of native coronary artery without angina pectoris: Secondary | ICD-10-CM | POA: Diagnosis not present

## 2024-01-12 DIAGNOSIS — E43 Unspecified severe protein-calorie malnutrition: Secondary | ICD-10-CM | POA: Diagnosis not present

## 2024-01-14 DIAGNOSIS — I119 Hypertensive heart disease without heart failure: Secondary | ICD-10-CM | POA: Diagnosis not present

## 2024-01-14 DIAGNOSIS — E119 Type 2 diabetes mellitus without complications: Secondary | ICD-10-CM | POA: Diagnosis not present

## 2024-01-14 DIAGNOSIS — F0280 Dementia in other diseases classified elsewhere without behavioral disturbance: Secondary | ICD-10-CM | POA: Diagnosis not present

## 2024-01-14 DIAGNOSIS — I2510 Atherosclerotic heart disease of native coronary artery without angina pectoris: Secondary | ICD-10-CM | POA: Diagnosis not present

## 2024-01-14 DIAGNOSIS — J449 Chronic obstructive pulmonary disease, unspecified: Secondary | ICD-10-CM | POA: Diagnosis not present

## 2024-01-14 DIAGNOSIS — F5105 Insomnia due to other mental disorder: Secondary | ICD-10-CM | POA: Diagnosis not present

## 2024-01-14 DIAGNOSIS — R296 Repeated falls: Secondary | ICD-10-CM | POA: Diagnosis not present

## 2024-01-14 DIAGNOSIS — E43 Unspecified severe protein-calorie malnutrition: Secondary | ICD-10-CM | POA: Diagnosis not present

## 2024-01-14 DIAGNOSIS — F0150 Vascular dementia without behavioral disturbance: Secondary | ICD-10-CM | POA: Diagnosis not present

## 2024-01-14 DIAGNOSIS — I679 Cerebrovascular disease, unspecified: Secondary | ICD-10-CM | POA: Diagnosis not present

## 2024-01-15 DIAGNOSIS — I119 Hypertensive heart disease without heart failure: Secondary | ICD-10-CM | POA: Diagnosis not present

## 2024-01-15 DIAGNOSIS — E119 Type 2 diabetes mellitus without complications: Secondary | ICD-10-CM | POA: Diagnosis not present

## 2024-01-15 DIAGNOSIS — I2510 Atherosclerotic heart disease of native coronary artery without angina pectoris: Secondary | ICD-10-CM | POA: Diagnosis not present

## 2024-01-15 DIAGNOSIS — J449 Chronic obstructive pulmonary disease, unspecified: Secondary | ICD-10-CM | POA: Diagnosis not present

## 2024-01-15 DIAGNOSIS — I679 Cerebrovascular disease, unspecified: Secondary | ICD-10-CM | POA: Diagnosis not present

## 2024-01-15 DIAGNOSIS — E43 Unspecified severe protein-calorie malnutrition: Secondary | ICD-10-CM | POA: Diagnosis not present

## 2024-01-16 DIAGNOSIS — I2510 Atherosclerotic heart disease of native coronary artery without angina pectoris: Secondary | ICD-10-CM | POA: Diagnosis not present

## 2024-01-16 DIAGNOSIS — I119 Hypertensive heart disease without heart failure: Secondary | ICD-10-CM | POA: Diagnosis not present

## 2024-01-16 DIAGNOSIS — I679 Cerebrovascular disease, unspecified: Secondary | ICD-10-CM | POA: Diagnosis not present

## 2024-01-16 DIAGNOSIS — J449 Chronic obstructive pulmonary disease, unspecified: Secondary | ICD-10-CM | POA: Diagnosis not present

## 2024-01-16 DIAGNOSIS — E119 Type 2 diabetes mellitus without complications: Secondary | ICD-10-CM | POA: Diagnosis not present

## 2024-01-16 DIAGNOSIS — E43 Unspecified severe protein-calorie malnutrition: Secondary | ICD-10-CM | POA: Diagnosis not present

## 2024-01-19 DIAGNOSIS — J449 Chronic obstructive pulmonary disease, unspecified: Secondary | ICD-10-CM | POA: Diagnosis not present

## 2024-01-19 DIAGNOSIS — E119 Type 2 diabetes mellitus without complications: Secondary | ICD-10-CM | POA: Diagnosis not present

## 2024-01-19 DIAGNOSIS — I679 Cerebrovascular disease, unspecified: Secondary | ICD-10-CM | POA: Diagnosis not present

## 2024-01-19 DIAGNOSIS — I119 Hypertensive heart disease without heart failure: Secondary | ICD-10-CM | POA: Diagnosis not present

## 2024-01-19 DIAGNOSIS — E43 Unspecified severe protein-calorie malnutrition: Secondary | ICD-10-CM | POA: Diagnosis not present

## 2024-01-19 DIAGNOSIS — I2510 Atherosclerotic heart disease of native coronary artery without angina pectoris: Secondary | ICD-10-CM | POA: Diagnosis not present

## 2024-01-21 DIAGNOSIS — I679 Cerebrovascular disease, unspecified: Secondary | ICD-10-CM | POA: Diagnosis not present

## 2024-01-21 DIAGNOSIS — K5900 Constipation, unspecified: Secondary | ICD-10-CM | POA: Diagnosis not present

## 2024-01-21 DIAGNOSIS — E119 Type 2 diabetes mellitus without complications: Secondary | ICD-10-CM | POA: Diagnosis not present

## 2024-01-21 DIAGNOSIS — J449 Chronic obstructive pulmonary disease, unspecified: Secondary | ICD-10-CM | POA: Diagnosis not present

## 2024-01-21 DIAGNOSIS — I119 Hypertensive heart disease without heart failure: Secondary | ICD-10-CM | POA: Diagnosis not present

## 2024-01-21 DIAGNOSIS — F0280 Dementia in other diseases classified elsewhere without behavioral disturbance: Secondary | ICD-10-CM | POA: Diagnosis not present

## 2024-01-21 DIAGNOSIS — I2510 Atherosclerotic heart disease of native coronary artery without angina pectoris: Secondary | ICD-10-CM | POA: Diagnosis not present

## 2024-01-21 DIAGNOSIS — E43 Unspecified severe protein-calorie malnutrition: Secondary | ICD-10-CM | POA: Diagnosis not present

## 2024-01-21 DIAGNOSIS — F0150 Vascular dementia without behavioral disturbance: Secondary | ICD-10-CM | POA: Diagnosis not present

## 2024-01-21 DIAGNOSIS — F5105 Insomnia due to other mental disorder: Secondary | ICD-10-CM | POA: Diagnosis not present

## 2024-01-22 DIAGNOSIS — I679 Cerebrovascular disease, unspecified: Secondary | ICD-10-CM | POA: Diagnosis not present

## 2024-01-22 DIAGNOSIS — J449 Chronic obstructive pulmonary disease, unspecified: Secondary | ICD-10-CM | POA: Diagnosis not present

## 2024-01-22 DIAGNOSIS — E43 Unspecified severe protein-calorie malnutrition: Secondary | ICD-10-CM | POA: Diagnosis not present

## 2024-01-22 DIAGNOSIS — I2510 Atherosclerotic heart disease of native coronary artery without angina pectoris: Secondary | ICD-10-CM | POA: Diagnosis not present

## 2024-01-22 DIAGNOSIS — E119 Type 2 diabetes mellitus without complications: Secondary | ICD-10-CM | POA: Diagnosis not present

## 2024-01-22 DIAGNOSIS — I119 Hypertensive heart disease without heart failure: Secondary | ICD-10-CM | POA: Diagnosis not present

## 2024-01-23 DIAGNOSIS — I679 Cerebrovascular disease, unspecified: Secondary | ICD-10-CM | POA: Diagnosis not present

## 2024-01-23 DIAGNOSIS — J449 Chronic obstructive pulmonary disease, unspecified: Secondary | ICD-10-CM | POA: Diagnosis not present

## 2024-01-23 DIAGNOSIS — E119 Type 2 diabetes mellitus without complications: Secondary | ICD-10-CM | POA: Diagnosis not present

## 2024-01-23 DIAGNOSIS — I119 Hypertensive heart disease without heart failure: Secondary | ICD-10-CM | POA: Diagnosis not present

## 2024-01-23 DIAGNOSIS — E43 Unspecified severe protein-calorie malnutrition: Secondary | ICD-10-CM | POA: Diagnosis not present

## 2024-01-23 DIAGNOSIS — I2510 Atherosclerotic heart disease of native coronary artery without angina pectoris: Secondary | ICD-10-CM | POA: Diagnosis not present

## 2024-01-26 DIAGNOSIS — I679 Cerebrovascular disease, unspecified: Secondary | ICD-10-CM | POA: Diagnosis not present

## 2024-01-26 DIAGNOSIS — E119 Type 2 diabetes mellitus without complications: Secondary | ICD-10-CM | POA: Diagnosis not present

## 2024-01-26 DIAGNOSIS — I119 Hypertensive heart disease without heart failure: Secondary | ICD-10-CM | POA: Diagnosis not present

## 2024-01-26 DIAGNOSIS — I2510 Atherosclerotic heart disease of native coronary artery without angina pectoris: Secondary | ICD-10-CM | POA: Diagnosis not present

## 2024-01-26 DIAGNOSIS — J449 Chronic obstructive pulmonary disease, unspecified: Secondary | ICD-10-CM | POA: Diagnosis not present

## 2024-01-26 DIAGNOSIS — E43 Unspecified severe protein-calorie malnutrition: Secondary | ICD-10-CM | POA: Diagnosis not present

## 2024-01-27 DIAGNOSIS — I2510 Atherosclerotic heart disease of native coronary artery without angina pectoris: Secondary | ICD-10-CM | POA: Diagnosis not present

## 2024-01-27 DIAGNOSIS — F32A Depression, unspecified: Secondary | ICD-10-CM | POA: Diagnosis not present

## 2024-01-27 DIAGNOSIS — I679 Cerebrovascular disease, unspecified: Secondary | ICD-10-CM | POA: Diagnosis not present

## 2024-01-27 DIAGNOSIS — E119 Type 2 diabetes mellitus without complications: Secondary | ICD-10-CM | POA: Diagnosis not present

## 2024-01-27 DIAGNOSIS — R296 Repeated falls: Secondary | ICD-10-CM | POA: Diagnosis not present

## 2024-01-27 DIAGNOSIS — J449 Chronic obstructive pulmonary disease, unspecified: Secondary | ICD-10-CM | POA: Diagnosis not present

## 2024-01-27 DIAGNOSIS — Z66 Do not resuscitate: Secondary | ICD-10-CM | POA: Diagnosis not present

## 2024-01-27 DIAGNOSIS — I119 Hypertensive heart disease without heart failure: Secondary | ICD-10-CM | POA: Diagnosis not present

## 2024-01-27 DIAGNOSIS — R55 Syncope and collapse: Secondary | ICD-10-CM | POA: Diagnosis not present

## 2024-01-27 DIAGNOSIS — E43 Unspecified severe protein-calorie malnutrition: Secondary | ICD-10-CM | POA: Diagnosis not present

## 2024-01-27 DIAGNOSIS — I619 Nontraumatic intracerebral hemorrhage, unspecified: Secondary | ICD-10-CM | POA: Diagnosis not present

## 2024-01-28 DIAGNOSIS — I679 Cerebrovascular disease, unspecified: Secondary | ICD-10-CM | POA: Diagnosis not present

## 2024-01-28 DIAGNOSIS — E119 Type 2 diabetes mellitus without complications: Secondary | ICD-10-CM | POA: Diagnosis not present

## 2024-01-28 DIAGNOSIS — J449 Chronic obstructive pulmonary disease, unspecified: Secondary | ICD-10-CM | POA: Diagnosis not present

## 2024-01-28 DIAGNOSIS — I119 Hypertensive heart disease without heart failure: Secondary | ICD-10-CM | POA: Diagnosis not present

## 2024-01-28 DIAGNOSIS — E43 Unspecified severe protein-calorie malnutrition: Secondary | ICD-10-CM | POA: Diagnosis not present

## 2024-01-28 DIAGNOSIS — I2510 Atherosclerotic heart disease of native coronary artery without angina pectoris: Secondary | ICD-10-CM | POA: Diagnosis not present

## 2024-01-29 DIAGNOSIS — I679 Cerebrovascular disease, unspecified: Secondary | ICD-10-CM | POA: Diagnosis not present

## 2024-01-29 DIAGNOSIS — I2510 Atherosclerotic heart disease of native coronary artery without angina pectoris: Secondary | ICD-10-CM | POA: Diagnosis not present

## 2024-01-29 DIAGNOSIS — E119 Type 2 diabetes mellitus without complications: Secondary | ICD-10-CM | POA: Diagnosis not present

## 2024-01-29 DIAGNOSIS — E43 Unspecified severe protein-calorie malnutrition: Secondary | ICD-10-CM | POA: Diagnosis not present

## 2024-01-29 DIAGNOSIS — I119 Hypertensive heart disease without heart failure: Secondary | ICD-10-CM | POA: Diagnosis not present

## 2024-01-29 DIAGNOSIS — J449 Chronic obstructive pulmonary disease, unspecified: Secondary | ICD-10-CM | POA: Diagnosis not present

## 2024-02-01 DIAGNOSIS — E119 Type 2 diabetes mellitus without complications: Secondary | ICD-10-CM | POA: Diagnosis not present

## 2024-02-01 DIAGNOSIS — J449 Chronic obstructive pulmonary disease, unspecified: Secondary | ICD-10-CM | POA: Diagnosis not present

## 2024-02-01 DIAGNOSIS — I679 Cerebrovascular disease, unspecified: Secondary | ICD-10-CM | POA: Diagnosis not present

## 2024-02-01 DIAGNOSIS — I2510 Atherosclerotic heart disease of native coronary artery without angina pectoris: Secondary | ICD-10-CM | POA: Diagnosis not present

## 2024-02-01 DIAGNOSIS — I119 Hypertensive heart disease without heart failure: Secondary | ICD-10-CM | POA: Diagnosis not present

## 2024-02-01 DIAGNOSIS — E43 Unspecified severe protein-calorie malnutrition: Secondary | ICD-10-CM | POA: Diagnosis not present

## 2024-02-02 DIAGNOSIS — I679 Cerebrovascular disease, unspecified: Secondary | ICD-10-CM | POA: Diagnosis not present

## 2024-02-02 DIAGNOSIS — I2510 Atherosclerotic heart disease of native coronary artery without angina pectoris: Secondary | ICD-10-CM | POA: Diagnosis not present

## 2024-02-02 DIAGNOSIS — I119 Hypertensive heart disease without heart failure: Secondary | ICD-10-CM | POA: Diagnosis not present

## 2024-02-02 DIAGNOSIS — J449 Chronic obstructive pulmonary disease, unspecified: Secondary | ICD-10-CM | POA: Diagnosis not present

## 2024-02-02 DIAGNOSIS — E119 Type 2 diabetes mellitus without complications: Secondary | ICD-10-CM | POA: Diagnosis not present

## 2024-02-02 DIAGNOSIS — E43 Unspecified severe protein-calorie malnutrition: Secondary | ICD-10-CM | POA: Diagnosis not present

## 2024-02-03 DIAGNOSIS — J449 Chronic obstructive pulmonary disease, unspecified: Secondary | ICD-10-CM | POA: Diagnosis not present

## 2024-02-03 DIAGNOSIS — E119 Type 2 diabetes mellitus without complications: Secondary | ICD-10-CM | POA: Diagnosis not present

## 2024-02-03 DIAGNOSIS — I2510 Atherosclerotic heart disease of native coronary artery without angina pectoris: Secondary | ICD-10-CM | POA: Diagnosis not present

## 2024-02-03 DIAGNOSIS — I119 Hypertensive heart disease without heart failure: Secondary | ICD-10-CM | POA: Diagnosis not present

## 2024-02-03 DIAGNOSIS — I679 Cerebrovascular disease, unspecified: Secondary | ICD-10-CM | POA: Diagnosis not present

## 2024-02-03 DIAGNOSIS — E43 Unspecified severe protein-calorie malnutrition: Secondary | ICD-10-CM | POA: Diagnosis not present

## 2024-02-04 DIAGNOSIS — I119 Hypertensive heart disease without heart failure: Secondary | ICD-10-CM | POA: Diagnosis not present

## 2024-02-04 DIAGNOSIS — E119 Type 2 diabetes mellitus without complications: Secondary | ICD-10-CM | POA: Diagnosis not present

## 2024-02-04 DIAGNOSIS — I679 Cerebrovascular disease, unspecified: Secondary | ICD-10-CM | POA: Diagnosis not present

## 2024-02-04 DIAGNOSIS — E43 Unspecified severe protein-calorie malnutrition: Secondary | ICD-10-CM | POA: Diagnosis not present

## 2024-02-04 DIAGNOSIS — I2510 Atherosclerotic heart disease of native coronary artery without angina pectoris: Secondary | ICD-10-CM | POA: Diagnosis not present

## 2024-02-04 DIAGNOSIS — J449 Chronic obstructive pulmonary disease, unspecified: Secondary | ICD-10-CM | POA: Diagnosis not present

## 2024-02-05 DIAGNOSIS — J449 Chronic obstructive pulmonary disease, unspecified: Secondary | ICD-10-CM | POA: Diagnosis not present

## 2024-02-05 DIAGNOSIS — I2510 Atherosclerotic heart disease of native coronary artery without angina pectoris: Secondary | ICD-10-CM | POA: Diagnosis not present

## 2024-02-05 DIAGNOSIS — I119 Hypertensive heart disease without heart failure: Secondary | ICD-10-CM | POA: Diagnosis not present

## 2024-02-05 DIAGNOSIS — E119 Type 2 diabetes mellitus without complications: Secondary | ICD-10-CM | POA: Diagnosis not present

## 2024-02-05 DIAGNOSIS — E43 Unspecified severe protein-calorie malnutrition: Secondary | ICD-10-CM | POA: Diagnosis not present

## 2024-02-05 DIAGNOSIS — I679 Cerebrovascular disease, unspecified: Secondary | ICD-10-CM | POA: Diagnosis not present

## 2024-02-09 DIAGNOSIS — I119 Hypertensive heart disease without heart failure: Secondary | ICD-10-CM | POA: Diagnosis not present

## 2024-02-09 DIAGNOSIS — E43 Unspecified severe protein-calorie malnutrition: Secondary | ICD-10-CM | POA: Diagnosis not present

## 2024-02-09 DIAGNOSIS — I679 Cerebrovascular disease, unspecified: Secondary | ICD-10-CM | POA: Diagnosis not present

## 2024-02-09 DIAGNOSIS — I2510 Atherosclerotic heart disease of native coronary artery without angina pectoris: Secondary | ICD-10-CM | POA: Diagnosis not present

## 2024-02-09 DIAGNOSIS — E119 Type 2 diabetes mellitus without complications: Secondary | ICD-10-CM | POA: Diagnosis not present

## 2024-02-09 DIAGNOSIS — J449 Chronic obstructive pulmonary disease, unspecified: Secondary | ICD-10-CM | POA: Diagnosis not present

## 2024-02-10 DIAGNOSIS — E43 Unspecified severe protein-calorie malnutrition: Secondary | ICD-10-CM | POA: Diagnosis not present

## 2024-02-10 DIAGNOSIS — I679 Cerebrovascular disease, unspecified: Secondary | ICD-10-CM | POA: Diagnosis not present

## 2024-02-10 DIAGNOSIS — E119 Type 2 diabetes mellitus without complications: Secondary | ICD-10-CM | POA: Diagnosis not present

## 2024-02-10 DIAGNOSIS — I2510 Atherosclerotic heart disease of native coronary artery without angina pectoris: Secondary | ICD-10-CM | POA: Diagnosis not present

## 2024-02-10 DIAGNOSIS — J449 Chronic obstructive pulmonary disease, unspecified: Secondary | ICD-10-CM | POA: Diagnosis not present

## 2024-02-10 DIAGNOSIS — I119 Hypertensive heart disease without heart failure: Secondary | ICD-10-CM | POA: Diagnosis not present

## 2024-02-11 DIAGNOSIS — I119 Hypertensive heart disease without heart failure: Secondary | ICD-10-CM | POA: Diagnosis not present

## 2024-02-11 DIAGNOSIS — I679 Cerebrovascular disease, unspecified: Secondary | ICD-10-CM | POA: Diagnosis not present

## 2024-02-11 DIAGNOSIS — E119 Type 2 diabetes mellitus without complications: Secondary | ICD-10-CM | POA: Diagnosis not present

## 2024-02-11 DIAGNOSIS — E43 Unspecified severe protein-calorie malnutrition: Secondary | ICD-10-CM | POA: Diagnosis not present

## 2024-02-11 DIAGNOSIS — J449 Chronic obstructive pulmonary disease, unspecified: Secondary | ICD-10-CM | POA: Diagnosis not present

## 2024-02-11 DIAGNOSIS — I2510 Atherosclerotic heart disease of native coronary artery without angina pectoris: Secondary | ICD-10-CM | POA: Diagnosis not present

## 2024-02-12 DIAGNOSIS — I119 Hypertensive heart disease without heart failure: Secondary | ICD-10-CM | POA: Diagnosis not present

## 2024-02-12 DIAGNOSIS — E119 Type 2 diabetes mellitus without complications: Secondary | ICD-10-CM | POA: Diagnosis not present

## 2024-02-12 DIAGNOSIS — I679 Cerebrovascular disease, unspecified: Secondary | ICD-10-CM | POA: Diagnosis not present

## 2024-02-12 DIAGNOSIS — I2510 Atherosclerotic heart disease of native coronary artery without angina pectoris: Secondary | ICD-10-CM | POA: Diagnosis not present

## 2024-02-12 DIAGNOSIS — E43 Unspecified severe protein-calorie malnutrition: Secondary | ICD-10-CM | POA: Diagnosis not present

## 2024-02-12 DIAGNOSIS — J449 Chronic obstructive pulmonary disease, unspecified: Secondary | ICD-10-CM | POA: Diagnosis not present

## 2024-02-16 DIAGNOSIS — I2510 Atherosclerotic heart disease of native coronary artery without angina pectoris: Secondary | ICD-10-CM | POA: Diagnosis not present

## 2024-02-16 DIAGNOSIS — J449 Chronic obstructive pulmonary disease, unspecified: Secondary | ICD-10-CM | POA: Diagnosis not present

## 2024-02-16 DIAGNOSIS — I119 Hypertensive heart disease without heart failure: Secondary | ICD-10-CM | POA: Diagnosis not present

## 2024-02-16 DIAGNOSIS — E119 Type 2 diabetes mellitus without complications: Secondary | ICD-10-CM | POA: Diagnosis not present

## 2024-02-16 DIAGNOSIS — I679 Cerebrovascular disease, unspecified: Secondary | ICD-10-CM | POA: Diagnosis not present

## 2024-02-16 DIAGNOSIS — E43 Unspecified severe protein-calorie malnutrition: Secondary | ICD-10-CM | POA: Diagnosis not present

## 2024-02-17 DIAGNOSIS — J449 Chronic obstructive pulmonary disease, unspecified: Secondary | ICD-10-CM | POA: Diagnosis not present

## 2024-02-17 DIAGNOSIS — E119 Type 2 diabetes mellitus without complications: Secondary | ICD-10-CM | POA: Diagnosis not present

## 2024-02-17 DIAGNOSIS — I119 Hypertensive heart disease without heart failure: Secondary | ICD-10-CM | POA: Diagnosis not present

## 2024-02-17 DIAGNOSIS — I2510 Atherosclerotic heart disease of native coronary artery without angina pectoris: Secondary | ICD-10-CM | POA: Diagnosis not present

## 2024-02-17 DIAGNOSIS — I679 Cerebrovascular disease, unspecified: Secondary | ICD-10-CM | POA: Diagnosis not present

## 2024-02-17 DIAGNOSIS — E43 Unspecified severe protein-calorie malnutrition: Secondary | ICD-10-CM | POA: Diagnosis not present

## 2024-02-18 DIAGNOSIS — F0150 Vascular dementia without behavioral disturbance: Secondary | ICD-10-CM | POA: Diagnosis not present

## 2024-02-18 DIAGNOSIS — E119 Type 2 diabetes mellitus without complications: Secondary | ICD-10-CM | POA: Diagnosis not present

## 2024-02-18 DIAGNOSIS — E43 Unspecified severe protein-calorie malnutrition: Secondary | ICD-10-CM | POA: Diagnosis not present

## 2024-02-18 DIAGNOSIS — I119 Hypertensive heart disease without heart failure: Secondary | ICD-10-CM | POA: Diagnosis not present

## 2024-02-18 DIAGNOSIS — J449 Chronic obstructive pulmonary disease, unspecified: Secondary | ICD-10-CM | POA: Diagnosis not present

## 2024-02-18 DIAGNOSIS — I679 Cerebrovascular disease, unspecified: Secondary | ICD-10-CM | POA: Diagnosis not present

## 2024-02-18 DIAGNOSIS — F5105 Insomnia due to other mental disorder: Secondary | ICD-10-CM | POA: Diagnosis not present

## 2024-02-18 DIAGNOSIS — I2510 Atherosclerotic heart disease of native coronary artery without angina pectoris: Secondary | ICD-10-CM | POA: Diagnosis not present

## 2024-02-19 DIAGNOSIS — E43 Unspecified severe protein-calorie malnutrition: Secondary | ICD-10-CM | POA: Diagnosis not present

## 2024-02-19 DIAGNOSIS — I2510 Atherosclerotic heart disease of native coronary artery without angina pectoris: Secondary | ICD-10-CM | POA: Diagnosis not present

## 2024-02-19 DIAGNOSIS — I119 Hypertensive heart disease without heart failure: Secondary | ICD-10-CM | POA: Diagnosis not present

## 2024-02-19 DIAGNOSIS — I679 Cerebrovascular disease, unspecified: Secondary | ICD-10-CM | POA: Diagnosis not present

## 2024-02-19 DIAGNOSIS — J449 Chronic obstructive pulmonary disease, unspecified: Secondary | ICD-10-CM | POA: Diagnosis not present

## 2024-02-19 DIAGNOSIS — E119 Type 2 diabetes mellitus without complications: Secondary | ICD-10-CM | POA: Diagnosis not present

## 2024-02-20 DIAGNOSIS — E119 Type 2 diabetes mellitus without complications: Secondary | ICD-10-CM | POA: Diagnosis not present

## 2024-02-20 DIAGNOSIS — I2510 Atherosclerotic heart disease of native coronary artery without angina pectoris: Secondary | ICD-10-CM | POA: Diagnosis not present

## 2024-02-20 DIAGNOSIS — E43 Unspecified severe protein-calorie malnutrition: Secondary | ICD-10-CM | POA: Diagnosis not present

## 2024-02-20 DIAGNOSIS — J449 Chronic obstructive pulmonary disease, unspecified: Secondary | ICD-10-CM | POA: Diagnosis not present

## 2024-02-20 DIAGNOSIS — I679 Cerebrovascular disease, unspecified: Secondary | ICD-10-CM | POA: Diagnosis not present

## 2024-02-20 DIAGNOSIS — I119 Hypertensive heart disease without heart failure: Secondary | ICD-10-CM | POA: Diagnosis not present

## 2024-02-23 DIAGNOSIS — E119 Type 2 diabetes mellitus without complications: Secondary | ICD-10-CM | POA: Diagnosis not present

## 2024-02-23 DIAGNOSIS — E43 Unspecified severe protein-calorie malnutrition: Secondary | ICD-10-CM | POA: Diagnosis not present

## 2024-02-23 DIAGNOSIS — I679 Cerebrovascular disease, unspecified: Secondary | ICD-10-CM | POA: Diagnosis not present

## 2024-02-23 DIAGNOSIS — J449 Chronic obstructive pulmonary disease, unspecified: Secondary | ICD-10-CM | POA: Diagnosis not present

## 2024-02-23 DIAGNOSIS — I119 Hypertensive heart disease without heart failure: Secondary | ICD-10-CM | POA: Diagnosis not present

## 2024-02-23 DIAGNOSIS — I2510 Atherosclerotic heart disease of native coronary artery without angina pectoris: Secondary | ICD-10-CM | POA: Diagnosis not present

## 2024-02-24 DIAGNOSIS — I119 Hypertensive heart disease without heart failure: Secondary | ICD-10-CM | POA: Diagnosis not present

## 2024-02-24 DIAGNOSIS — I679 Cerebrovascular disease, unspecified: Secondary | ICD-10-CM | POA: Diagnosis not present

## 2024-02-24 DIAGNOSIS — B351 Tinea unguium: Secondary | ICD-10-CM | POA: Diagnosis not present

## 2024-02-24 DIAGNOSIS — J449 Chronic obstructive pulmonary disease, unspecified: Secondary | ICD-10-CM | POA: Diagnosis not present

## 2024-02-24 DIAGNOSIS — E43 Unspecified severe protein-calorie malnutrition: Secondary | ICD-10-CM | POA: Diagnosis not present

## 2024-02-24 DIAGNOSIS — I2510 Atherosclerotic heart disease of native coronary artery without angina pectoris: Secondary | ICD-10-CM | POA: Diagnosis not present

## 2024-02-24 DIAGNOSIS — M79675 Pain in left toe(s): Secondary | ICD-10-CM | POA: Diagnosis not present

## 2024-02-24 DIAGNOSIS — M2012 Hallux valgus (acquired), left foot: Secondary | ICD-10-CM | POA: Diagnosis not present

## 2024-02-24 DIAGNOSIS — E119 Type 2 diabetes mellitus without complications: Secondary | ICD-10-CM | POA: Diagnosis not present

## 2024-02-25 DIAGNOSIS — I679 Cerebrovascular disease, unspecified: Secondary | ICD-10-CM | POA: Diagnosis not present

## 2024-02-25 DIAGNOSIS — E43 Unspecified severe protein-calorie malnutrition: Secondary | ICD-10-CM | POA: Diagnosis not present

## 2024-02-25 DIAGNOSIS — F5105 Insomnia due to other mental disorder: Secondary | ICD-10-CM | POA: Diagnosis not present

## 2024-02-25 DIAGNOSIS — J449 Chronic obstructive pulmonary disease, unspecified: Secondary | ICD-10-CM | POA: Diagnosis not present

## 2024-02-25 DIAGNOSIS — E119 Type 2 diabetes mellitus without complications: Secondary | ICD-10-CM | POA: Diagnosis not present

## 2024-02-25 DIAGNOSIS — F0150 Vascular dementia without behavioral disturbance: Secondary | ICD-10-CM | POA: Diagnosis not present

## 2024-02-25 DIAGNOSIS — I2510 Atherosclerotic heart disease of native coronary artery without angina pectoris: Secondary | ICD-10-CM | POA: Diagnosis not present

## 2024-02-25 DIAGNOSIS — I119 Hypertensive heart disease without heart failure: Secondary | ICD-10-CM | POA: Diagnosis not present

## 2024-02-26 DIAGNOSIS — I679 Cerebrovascular disease, unspecified: Secondary | ICD-10-CM | POA: Diagnosis not present

## 2024-02-26 DIAGNOSIS — I2510 Atherosclerotic heart disease of native coronary artery without angina pectoris: Secondary | ICD-10-CM | POA: Diagnosis not present

## 2024-02-26 DIAGNOSIS — E119 Type 2 diabetes mellitus without complications: Secondary | ICD-10-CM | POA: Diagnosis not present

## 2024-02-26 DIAGNOSIS — R55 Syncope and collapse: Secondary | ICD-10-CM | POA: Diagnosis not present

## 2024-02-26 DIAGNOSIS — Z66 Do not resuscitate: Secondary | ICD-10-CM | POA: Diagnosis not present

## 2024-02-26 DIAGNOSIS — R296 Repeated falls: Secondary | ICD-10-CM | POA: Diagnosis not present

## 2024-02-26 DIAGNOSIS — I119 Hypertensive heart disease without heart failure: Secondary | ICD-10-CM | POA: Diagnosis not present

## 2024-02-26 DIAGNOSIS — F32A Depression, unspecified: Secondary | ICD-10-CM | POA: Diagnosis not present

## 2024-02-26 DIAGNOSIS — J449 Chronic obstructive pulmonary disease, unspecified: Secondary | ICD-10-CM | POA: Diagnosis not present

## 2024-02-26 DIAGNOSIS — E43 Unspecified severe protein-calorie malnutrition: Secondary | ICD-10-CM | POA: Diagnosis not present

## 2024-02-26 DIAGNOSIS — I619 Nontraumatic intracerebral hemorrhage, unspecified: Secondary | ICD-10-CM | POA: Diagnosis not present

## 2024-03-01 DIAGNOSIS — E119 Type 2 diabetes mellitus without complications: Secondary | ICD-10-CM | POA: Diagnosis not present

## 2024-03-01 DIAGNOSIS — E43 Unspecified severe protein-calorie malnutrition: Secondary | ICD-10-CM | POA: Diagnosis not present

## 2024-03-01 DIAGNOSIS — I679 Cerebrovascular disease, unspecified: Secondary | ICD-10-CM | POA: Diagnosis not present

## 2024-03-01 DIAGNOSIS — I119 Hypertensive heart disease without heart failure: Secondary | ICD-10-CM | POA: Diagnosis not present

## 2024-03-01 DIAGNOSIS — J449 Chronic obstructive pulmonary disease, unspecified: Secondary | ICD-10-CM | POA: Diagnosis not present

## 2024-03-01 DIAGNOSIS — I2510 Atherosclerotic heart disease of native coronary artery without angina pectoris: Secondary | ICD-10-CM | POA: Diagnosis not present

## 2024-03-02 DIAGNOSIS — I679 Cerebrovascular disease, unspecified: Secondary | ICD-10-CM | POA: Diagnosis not present

## 2024-03-02 DIAGNOSIS — J449 Chronic obstructive pulmonary disease, unspecified: Secondary | ICD-10-CM | POA: Diagnosis not present

## 2024-03-02 DIAGNOSIS — E43 Unspecified severe protein-calorie malnutrition: Secondary | ICD-10-CM | POA: Diagnosis not present

## 2024-03-02 DIAGNOSIS — E119 Type 2 diabetes mellitus without complications: Secondary | ICD-10-CM | POA: Diagnosis not present

## 2024-03-02 DIAGNOSIS — I2510 Atherosclerotic heart disease of native coronary artery without angina pectoris: Secondary | ICD-10-CM | POA: Diagnosis not present

## 2024-03-02 DIAGNOSIS — I119 Hypertensive heart disease without heart failure: Secondary | ICD-10-CM | POA: Diagnosis not present

## 2024-03-03 DIAGNOSIS — I119 Hypertensive heart disease without heart failure: Secondary | ICD-10-CM | POA: Diagnosis not present

## 2024-03-03 DIAGNOSIS — E119 Type 2 diabetes mellitus without complications: Secondary | ICD-10-CM | POA: Diagnosis not present

## 2024-03-03 DIAGNOSIS — E43 Unspecified severe protein-calorie malnutrition: Secondary | ICD-10-CM | POA: Diagnosis not present

## 2024-03-03 DIAGNOSIS — I2510 Atherosclerotic heart disease of native coronary artery without angina pectoris: Secondary | ICD-10-CM | POA: Diagnosis not present

## 2024-03-03 DIAGNOSIS — I679 Cerebrovascular disease, unspecified: Secondary | ICD-10-CM | POA: Diagnosis not present

## 2024-03-03 DIAGNOSIS — J449 Chronic obstructive pulmonary disease, unspecified: Secondary | ICD-10-CM | POA: Diagnosis not present

## 2024-03-04 DIAGNOSIS — E119 Type 2 diabetes mellitus without complications: Secondary | ICD-10-CM | POA: Diagnosis not present

## 2024-03-04 DIAGNOSIS — I119 Hypertensive heart disease without heart failure: Secondary | ICD-10-CM | POA: Diagnosis not present

## 2024-03-04 DIAGNOSIS — E43 Unspecified severe protein-calorie malnutrition: Secondary | ICD-10-CM | POA: Diagnosis not present

## 2024-03-04 DIAGNOSIS — J449 Chronic obstructive pulmonary disease, unspecified: Secondary | ICD-10-CM | POA: Diagnosis not present

## 2024-03-04 DIAGNOSIS — I679 Cerebrovascular disease, unspecified: Secondary | ICD-10-CM | POA: Diagnosis not present

## 2024-03-04 DIAGNOSIS — I2510 Atherosclerotic heart disease of native coronary artery without angina pectoris: Secondary | ICD-10-CM | POA: Diagnosis not present

## 2024-03-08 DIAGNOSIS — I2510 Atherosclerotic heart disease of native coronary artery without angina pectoris: Secondary | ICD-10-CM | POA: Diagnosis not present

## 2024-03-08 DIAGNOSIS — I679 Cerebrovascular disease, unspecified: Secondary | ICD-10-CM | POA: Diagnosis not present

## 2024-03-08 DIAGNOSIS — E43 Unspecified severe protein-calorie malnutrition: Secondary | ICD-10-CM | POA: Diagnosis not present

## 2024-03-08 DIAGNOSIS — I119 Hypertensive heart disease without heart failure: Secondary | ICD-10-CM | POA: Diagnosis not present

## 2024-03-08 DIAGNOSIS — E119 Type 2 diabetes mellitus without complications: Secondary | ICD-10-CM | POA: Diagnosis not present

## 2024-03-08 DIAGNOSIS — J449 Chronic obstructive pulmonary disease, unspecified: Secondary | ICD-10-CM | POA: Diagnosis not present

## 2024-03-09 DIAGNOSIS — I679 Cerebrovascular disease, unspecified: Secondary | ICD-10-CM | POA: Diagnosis not present

## 2024-03-09 DIAGNOSIS — I119 Hypertensive heart disease without heart failure: Secondary | ICD-10-CM | POA: Diagnosis not present

## 2024-03-09 DIAGNOSIS — J449 Chronic obstructive pulmonary disease, unspecified: Secondary | ICD-10-CM | POA: Diagnosis not present

## 2024-03-09 DIAGNOSIS — E119 Type 2 diabetes mellitus without complications: Secondary | ICD-10-CM | POA: Diagnosis not present

## 2024-03-09 DIAGNOSIS — E43 Unspecified severe protein-calorie malnutrition: Secondary | ICD-10-CM | POA: Diagnosis not present

## 2024-03-09 DIAGNOSIS — I2510 Atherosclerotic heart disease of native coronary artery without angina pectoris: Secondary | ICD-10-CM | POA: Diagnosis not present

## 2024-03-10 DIAGNOSIS — E119 Type 2 diabetes mellitus without complications: Secondary | ICD-10-CM | POA: Diagnosis not present

## 2024-03-10 DIAGNOSIS — J449 Chronic obstructive pulmonary disease, unspecified: Secondary | ICD-10-CM | POA: Diagnosis not present

## 2024-03-10 DIAGNOSIS — E43 Unspecified severe protein-calorie malnutrition: Secondary | ICD-10-CM | POA: Diagnosis not present

## 2024-03-10 DIAGNOSIS — I679 Cerebrovascular disease, unspecified: Secondary | ICD-10-CM | POA: Diagnosis not present

## 2024-03-10 DIAGNOSIS — I2510 Atherosclerotic heart disease of native coronary artery without angina pectoris: Secondary | ICD-10-CM | POA: Diagnosis not present

## 2024-03-10 DIAGNOSIS — I119 Hypertensive heart disease without heart failure: Secondary | ICD-10-CM | POA: Diagnosis not present

## 2024-03-15 DIAGNOSIS — I119 Hypertensive heart disease without heart failure: Secondary | ICD-10-CM | POA: Diagnosis not present

## 2024-03-15 DIAGNOSIS — E119 Type 2 diabetes mellitus without complications: Secondary | ICD-10-CM | POA: Diagnosis not present

## 2024-03-15 DIAGNOSIS — E1159 Type 2 diabetes mellitus with other circulatory complications: Secondary | ICD-10-CM | POA: Diagnosis not present

## 2024-03-15 DIAGNOSIS — F01A0 Vascular dementia, mild, without behavioral disturbance, psychotic disturbance, mood disturbance, and anxiety: Secondary | ICD-10-CM | POA: Diagnosis not present

## 2024-03-15 DIAGNOSIS — E43 Unspecified severe protein-calorie malnutrition: Secondary | ICD-10-CM | POA: Diagnosis not present

## 2024-03-15 DIAGNOSIS — M170 Bilateral primary osteoarthritis of knee: Secondary | ICD-10-CM | POA: Diagnosis not present

## 2024-03-15 DIAGNOSIS — I10 Essential (primary) hypertension: Secondary | ICD-10-CM | POA: Diagnosis not present

## 2024-03-15 DIAGNOSIS — F331 Major depressive disorder, recurrent, moderate: Secondary | ICD-10-CM | POA: Diagnosis not present

## 2024-03-15 DIAGNOSIS — I2510 Atherosclerotic heart disease of native coronary artery without angina pectoris: Secondary | ICD-10-CM | POA: Diagnosis not present

## 2024-03-15 DIAGNOSIS — I679 Cerebrovascular disease, unspecified: Secondary | ICD-10-CM | POA: Diagnosis not present

## 2024-03-15 DIAGNOSIS — E46 Unspecified protein-calorie malnutrition: Secondary | ICD-10-CM | POA: Diagnosis not present

## 2024-03-15 DIAGNOSIS — I63239 Cerebral infarction due to unspecified occlusion or stenosis of unspecified carotid arteries: Secondary | ICD-10-CM | POA: Diagnosis not present

## 2024-03-15 DIAGNOSIS — J449 Chronic obstructive pulmonary disease, unspecified: Secondary | ICD-10-CM | POA: Diagnosis not present

## 2024-03-15 DIAGNOSIS — I455 Other specified heart block: Secondary | ICD-10-CM | POA: Diagnosis not present

## 2024-03-16 DIAGNOSIS — E119 Type 2 diabetes mellitus without complications: Secondary | ICD-10-CM | POA: Diagnosis not present

## 2024-03-16 DIAGNOSIS — I2510 Atherosclerotic heart disease of native coronary artery without angina pectoris: Secondary | ICD-10-CM | POA: Diagnosis not present

## 2024-03-16 DIAGNOSIS — I119 Hypertensive heart disease without heart failure: Secondary | ICD-10-CM | POA: Diagnosis not present

## 2024-03-16 DIAGNOSIS — I679 Cerebrovascular disease, unspecified: Secondary | ICD-10-CM | POA: Diagnosis not present

## 2024-03-16 DIAGNOSIS — J449 Chronic obstructive pulmonary disease, unspecified: Secondary | ICD-10-CM | POA: Diagnosis not present

## 2024-03-16 DIAGNOSIS — E43 Unspecified severe protein-calorie malnutrition: Secondary | ICD-10-CM | POA: Diagnosis not present

## 2024-03-17 DIAGNOSIS — F5105 Insomnia due to other mental disorder: Secondary | ICD-10-CM | POA: Diagnosis not present

## 2024-03-17 DIAGNOSIS — E119 Type 2 diabetes mellitus without complications: Secondary | ICD-10-CM | POA: Diagnosis not present

## 2024-03-17 DIAGNOSIS — E43 Unspecified severe protein-calorie malnutrition: Secondary | ICD-10-CM | POA: Diagnosis not present

## 2024-03-17 DIAGNOSIS — J449 Chronic obstructive pulmonary disease, unspecified: Secondary | ICD-10-CM | POA: Diagnosis not present

## 2024-03-17 DIAGNOSIS — I2510 Atherosclerotic heart disease of native coronary artery without angina pectoris: Secondary | ICD-10-CM | POA: Diagnosis not present

## 2024-03-17 DIAGNOSIS — I119 Hypertensive heart disease without heart failure: Secondary | ICD-10-CM | POA: Diagnosis not present

## 2024-03-17 DIAGNOSIS — I679 Cerebrovascular disease, unspecified: Secondary | ICD-10-CM | POA: Diagnosis not present

## 2024-03-17 DIAGNOSIS — E1159 Type 2 diabetes mellitus with other circulatory complications: Secondary | ICD-10-CM | POA: Diagnosis not present

## 2024-03-17 DIAGNOSIS — F01A0 Vascular dementia, mild, without behavioral disturbance, psychotic disturbance, mood disturbance, and anxiety: Secondary | ICD-10-CM | POA: Diagnosis not present

## 2024-03-20 DIAGNOSIS — I679 Cerebrovascular disease, unspecified: Secondary | ICD-10-CM | POA: Diagnosis not present

## 2024-03-20 DIAGNOSIS — I2510 Atherosclerotic heart disease of native coronary artery without angina pectoris: Secondary | ICD-10-CM | POA: Diagnosis not present

## 2024-03-20 DIAGNOSIS — E119 Type 2 diabetes mellitus without complications: Secondary | ICD-10-CM | POA: Diagnosis not present

## 2024-03-20 DIAGNOSIS — J449 Chronic obstructive pulmonary disease, unspecified: Secondary | ICD-10-CM | POA: Diagnosis not present

## 2024-03-20 DIAGNOSIS — E43 Unspecified severe protein-calorie malnutrition: Secondary | ICD-10-CM | POA: Diagnosis not present

## 2024-03-20 DIAGNOSIS — I119 Hypertensive heart disease without heart failure: Secondary | ICD-10-CM | POA: Diagnosis not present

## 2024-03-21 DIAGNOSIS — J449 Chronic obstructive pulmonary disease, unspecified: Secondary | ICD-10-CM | POA: Diagnosis not present

## 2024-03-21 DIAGNOSIS — I679 Cerebrovascular disease, unspecified: Secondary | ICD-10-CM | POA: Diagnosis not present

## 2024-03-21 DIAGNOSIS — I119 Hypertensive heart disease without heart failure: Secondary | ICD-10-CM | POA: Diagnosis not present

## 2024-03-21 DIAGNOSIS — E119 Type 2 diabetes mellitus without complications: Secondary | ICD-10-CM | POA: Diagnosis not present

## 2024-03-21 DIAGNOSIS — I2510 Atherosclerotic heart disease of native coronary artery without angina pectoris: Secondary | ICD-10-CM | POA: Diagnosis not present

## 2024-03-21 DIAGNOSIS — E43 Unspecified severe protein-calorie malnutrition: Secondary | ICD-10-CM | POA: Diagnosis not present

## 2024-03-23 DIAGNOSIS — J449 Chronic obstructive pulmonary disease, unspecified: Secondary | ICD-10-CM | POA: Diagnosis not present

## 2024-03-23 DIAGNOSIS — E119 Type 2 diabetes mellitus without complications: Secondary | ICD-10-CM | POA: Diagnosis not present

## 2024-03-23 DIAGNOSIS — I679 Cerebrovascular disease, unspecified: Secondary | ICD-10-CM | POA: Diagnosis not present

## 2024-03-23 DIAGNOSIS — E43 Unspecified severe protein-calorie malnutrition: Secondary | ICD-10-CM | POA: Diagnosis not present

## 2024-03-23 DIAGNOSIS — I2510 Atherosclerotic heart disease of native coronary artery without angina pectoris: Secondary | ICD-10-CM | POA: Diagnosis not present

## 2024-03-23 DIAGNOSIS — I119 Hypertensive heart disease without heart failure: Secondary | ICD-10-CM | POA: Diagnosis not present

## 2024-03-24 DIAGNOSIS — I119 Hypertensive heart disease without heart failure: Secondary | ICD-10-CM | POA: Diagnosis not present

## 2024-03-24 DIAGNOSIS — J449 Chronic obstructive pulmonary disease, unspecified: Secondary | ICD-10-CM | POA: Diagnosis not present

## 2024-03-24 DIAGNOSIS — G894 Chronic pain syndrome: Secondary | ICD-10-CM | POA: Diagnosis not present

## 2024-03-24 DIAGNOSIS — E43 Unspecified severe protein-calorie malnutrition: Secondary | ICD-10-CM | POA: Diagnosis not present

## 2024-03-24 DIAGNOSIS — I2510 Atherosclerotic heart disease of native coronary artery without angina pectoris: Secondary | ICD-10-CM | POA: Diagnosis not present

## 2024-03-24 DIAGNOSIS — I679 Cerebrovascular disease, unspecified: Secondary | ICD-10-CM | POA: Diagnosis not present

## 2024-03-24 DIAGNOSIS — E119 Type 2 diabetes mellitus without complications: Secondary | ICD-10-CM | POA: Diagnosis not present

## 2024-03-28 DIAGNOSIS — I2510 Atherosclerotic heart disease of native coronary artery without angina pectoris: Secondary | ICD-10-CM | POA: Diagnosis not present

## 2024-03-28 DIAGNOSIS — R55 Syncope and collapse: Secondary | ICD-10-CM | POA: Diagnosis not present

## 2024-03-28 DIAGNOSIS — Z66 Do not resuscitate: Secondary | ICD-10-CM | POA: Diagnosis not present

## 2024-03-28 DIAGNOSIS — I619 Nontraumatic intracerebral hemorrhage, unspecified: Secondary | ICD-10-CM | POA: Diagnosis not present

## 2024-03-28 DIAGNOSIS — J449 Chronic obstructive pulmonary disease, unspecified: Secondary | ICD-10-CM | POA: Diagnosis not present

## 2024-03-28 DIAGNOSIS — R296 Repeated falls: Secondary | ICD-10-CM | POA: Diagnosis not present

## 2024-03-28 DIAGNOSIS — F32A Depression, unspecified: Secondary | ICD-10-CM | POA: Diagnosis not present

## 2024-03-28 DIAGNOSIS — E43 Unspecified severe protein-calorie malnutrition: Secondary | ICD-10-CM | POA: Diagnosis not present

## 2024-03-28 DIAGNOSIS — I119 Hypertensive heart disease without heart failure: Secondary | ICD-10-CM | POA: Diagnosis not present

## 2024-03-28 DIAGNOSIS — I679 Cerebrovascular disease, unspecified: Secondary | ICD-10-CM | POA: Diagnosis not present

## 2024-03-28 DIAGNOSIS — E119 Type 2 diabetes mellitus without complications: Secondary | ICD-10-CM | POA: Diagnosis not present

## 2024-03-29 DIAGNOSIS — I679 Cerebrovascular disease, unspecified: Secondary | ICD-10-CM | POA: Diagnosis not present

## 2024-03-29 DIAGNOSIS — J449 Chronic obstructive pulmonary disease, unspecified: Secondary | ICD-10-CM | POA: Diagnosis not present

## 2024-03-29 DIAGNOSIS — I119 Hypertensive heart disease without heart failure: Secondary | ICD-10-CM | POA: Diagnosis not present

## 2024-03-29 DIAGNOSIS — E119 Type 2 diabetes mellitus without complications: Secondary | ICD-10-CM | POA: Diagnosis not present

## 2024-03-29 DIAGNOSIS — E43 Unspecified severe protein-calorie malnutrition: Secondary | ICD-10-CM | POA: Diagnosis not present

## 2024-03-29 DIAGNOSIS — I2510 Atherosclerotic heart disease of native coronary artery without angina pectoris: Secondary | ICD-10-CM | POA: Diagnosis not present

## 2024-03-30 DIAGNOSIS — J449 Chronic obstructive pulmonary disease, unspecified: Secondary | ICD-10-CM | POA: Diagnosis not present

## 2024-03-30 DIAGNOSIS — I679 Cerebrovascular disease, unspecified: Secondary | ICD-10-CM | POA: Diagnosis not present

## 2024-03-30 DIAGNOSIS — E43 Unspecified severe protein-calorie malnutrition: Secondary | ICD-10-CM | POA: Diagnosis not present

## 2024-03-30 DIAGNOSIS — I2510 Atherosclerotic heart disease of native coronary artery without angina pectoris: Secondary | ICD-10-CM | POA: Diagnosis not present

## 2024-03-30 DIAGNOSIS — E119 Type 2 diabetes mellitus without complications: Secondary | ICD-10-CM | POA: Diagnosis not present

## 2024-03-30 DIAGNOSIS — I119 Hypertensive heart disease without heart failure: Secondary | ICD-10-CM | POA: Diagnosis not present

## 2024-03-31 DIAGNOSIS — I2510 Atherosclerotic heart disease of native coronary artery without angina pectoris: Secondary | ICD-10-CM | POA: Diagnosis not present

## 2024-03-31 DIAGNOSIS — J449 Chronic obstructive pulmonary disease, unspecified: Secondary | ICD-10-CM | POA: Diagnosis not present

## 2024-03-31 DIAGNOSIS — E43 Unspecified severe protein-calorie malnutrition: Secondary | ICD-10-CM | POA: Diagnosis not present

## 2024-03-31 DIAGNOSIS — E119 Type 2 diabetes mellitus without complications: Secondary | ICD-10-CM | POA: Diagnosis not present

## 2024-03-31 DIAGNOSIS — I679 Cerebrovascular disease, unspecified: Secondary | ICD-10-CM | POA: Diagnosis not present

## 2024-03-31 DIAGNOSIS — I119 Hypertensive heart disease without heart failure: Secondary | ICD-10-CM | POA: Diagnosis not present

## 2024-04-05 DIAGNOSIS — I679 Cerebrovascular disease, unspecified: Secondary | ICD-10-CM | POA: Diagnosis not present

## 2024-04-05 DIAGNOSIS — E43 Unspecified severe protein-calorie malnutrition: Secondary | ICD-10-CM | POA: Diagnosis not present

## 2024-04-05 DIAGNOSIS — I119 Hypertensive heart disease without heart failure: Secondary | ICD-10-CM | POA: Diagnosis not present

## 2024-04-05 DIAGNOSIS — I2510 Atherosclerotic heart disease of native coronary artery without angina pectoris: Secondary | ICD-10-CM | POA: Diagnosis not present

## 2024-04-05 DIAGNOSIS — E119 Type 2 diabetes mellitus without complications: Secondary | ICD-10-CM | POA: Diagnosis not present

## 2024-04-05 DIAGNOSIS — J449 Chronic obstructive pulmonary disease, unspecified: Secondary | ICD-10-CM | POA: Diagnosis not present

## 2024-04-06 DIAGNOSIS — E119 Type 2 diabetes mellitus without complications: Secondary | ICD-10-CM | POA: Diagnosis not present

## 2024-04-06 DIAGNOSIS — I2510 Atherosclerotic heart disease of native coronary artery without angina pectoris: Secondary | ICD-10-CM | POA: Diagnosis not present

## 2024-04-06 DIAGNOSIS — I119 Hypertensive heart disease without heart failure: Secondary | ICD-10-CM | POA: Diagnosis not present

## 2024-04-06 DIAGNOSIS — J449 Chronic obstructive pulmonary disease, unspecified: Secondary | ICD-10-CM | POA: Diagnosis not present

## 2024-04-06 DIAGNOSIS — I679 Cerebrovascular disease, unspecified: Secondary | ICD-10-CM | POA: Diagnosis not present

## 2024-04-06 DIAGNOSIS — E43 Unspecified severe protein-calorie malnutrition: Secondary | ICD-10-CM | POA: Diagnosis not present

## 2024-04-07 DIAGNOSIS — J449 Chronic obstructive pulmonary disease, unspecified: Secondary | ICD-10-CM | POA: Diagnosis not present

## 2024-04-07 DIAGNOSIS — I679 Cerebrovascular disease, unspecified: Secondary | ICD-10-CM | POA: Diagnosis not present

## 2024-04-07 DIAGNOSIS — I2510 Atherosclerotic heart disease of native coronary artery without angina pectoris: Secondary | ICD-10-CM | POA: Diagnosis not present

## 2024-04-07 DIAGNOSIS — I119 Hypertensive heart disease without heart failure: Secondary | ICD-10-CM | POA: Diagnosis not present

## 2024-04-07 DIAGNOSIS — E119 Type 2 diabetes mellitus without complications: Secondary | ICD-10-CM | POA: Diagnosis not present

## 2024-04-07 DIAGNOSIS — E43 Unspecified severe protein-calorie malnutrition: Secondary | ICD-10-CM | POA: Diagnosis not present

## 2024-04-08 DIAGNOSIS — J449 Chronic obstructive pulmonary disease, unspecified: Secondary | ICD-10-CM | POA: Diagnosis not present

## 2024-04-08 DIAGNOSIS — I119 Hypertensive heart disease without heart failure: Secondary | ICD-10-CM | POA: Diagnosis not present

## 2024-04-08 DIAGNOSIS — E43 Unspecified severe protein-calorie malnutrition: Secondary | ICD-10-CM | POA: Diagnosis not present

## 2024-04-08 DIAGNOSIS — I2510 Atherosclerotic heart disease of native coronary artery without angina pectoris: Secondary | ICD-10-CM | POA: Diagnosis not present

## 2024-04-08 DIAGNOSIS — I679 Cerebrovascular disease, unspecified: Secondary | ICD-10-CM | POA: Diagnosis not present

## 2024-04-08 DIAGNOSIS — E119 Type 2 diabetes mellitus without complications: Secondary | ICD-10-CM | POA: Diagnosis not present

## 2024-04-09 DIAGNOSIS — E119 Type 2 diabetes mellitus without complications: Secondary | ICD-10-CM | POA: Diagnosis not present

## 2024-04-09 DIAGNOSIS — I2510 Atherosclerotic heart disease of native coronary artery without angina pectoris: Secondary | ICD-10-CM | POA: Diagnosis not present

## 2024-04-09 DIAGNOSIS — E43 Unspecified severe protein-calorie malnutrition: Secondary | ICD-10-CM | POA: Diagnosis not present

## 2024-04-09 DIAGNOSIS — I679 Cerebrovascular disease, unspecified: Secondary | ICD-10-CM | POA: Diagnosis not present

## 2024-04-09 DIAGNOSIS — I119 Hypertensive heart disease without heart failure: Secondary | ICD-10-CM | POA: Diagnosis not present

## 2024-04-09 DIAGNOSIS — J449 Chronic obstructive pulmonary disease, unspecified: Secondary | ICD-10-CM | POA: Diagnosis not present

## 2024-04-12 DIAGNOSIS — E119 Type 2 diabetes mellitus without complications: Secondary | ICD-10-CM | POA: Diagnosis not present

## 2024-04-12 DIAGNOSIS — E43 Unspecified severe protein-calorie malnutrition: Secondary | ICD-10-CM | POA: Diagnosis not present

## 2024-04-12 DIAGNOSIS — I119 Hypertensive heart disease without heart failure: Secondary | ICD-10-CM | POA: Diagnosis not present

## 2024-04-12 DIAGNOSIS — J449 Chronic obstructive pulmonary disease, unspecified: Secondary | ICD-10-CM | POA: Diagnosis not present

## 2024-04-12 DIAGNOSIS — I2510 Atherosclerotic heart disease of native coronary artery without angina pectoris: Secondary | ICD-10-CM | POA: Diagnosis not present

## 2024-04-12 DIAGNOSIS — I679 Cerebrovascular disease, unspecified: Secondary | ICD-10-CM | POA: Diagnosis not present

## 2024-04-13 DIAGNOSIS — J449 Chronic obstructive pulmonary disease, unspecified: Secondary | ICD-10-CM | POA: Diagnosis not present

## 2024-04-13 DIAGNOSIS — I119 Hypertensive heart disease without heart failure: Secondary | ICD-10-CM | POA: Diagnosis not present

## 2024-04-13 DIAGNOSIS — E43 Unspecified severe protein-calorie malnutrition: Secondary | ICD-10-CM | POA: Diagnosis not present

## 2024-04-13 DIAGNOSIS — I679 Cerebrovascular disease, unspecified: Secondary | ICD-10-CM | POA: Diagnosis not present

## 2024-04-13 DIAGNOSIS — E119 Type 2 diabetes mellitus without complications: Secondary | ICD-10-CM | POA: Diagnosis not present

## 2024-04-13 DIAGNOSIS — I2510 Atherosclerotic heart disease of native coronary artery without angina pectoris: Secondary | ICD-10-CM | POA: Diagnosis not present

## 2024-04-14 DIAGNOSIS — I2510 Atherosclerotic heart disease of native coronary artery without angina pectoris: Secondary | ICD-10-CM | POA: Diagnosis not present

## 2024-04-14 DIAGNOSIS — I10 Essential (primary) hypertension: Secondary | ICD-10-CM | POA: Diagnosis not present

## 2024-04-14 DIAGNOSIS — E43 Unspecified severe protein-calorie malnutrition: Secondary | ICD-10-CM | POA: Diagnosis not present

## 2024-04-14 DIAGNOSIS — J449 Chronic obstructive pulmonary disease, unspecified: Secondary | ICD-10-CM | POA: Diagnosis not present

## 2024-04-14 DIAGNOSIS — F5105 Insomnia due to other mental disorder: Secondary | ICD-10-CM | POA: Diagnosis not present

## 2024-04-14 DIAGNOSIS — I119 Hypertensive heart disease without heart failure: Secondary | ICD-10-CM | POA: Diagnosis not present

## 2024-04-14 DIAGNOSIS — E119 Type 2 diabetes mellitus without complications: Secondary | ICD-10-CM | POA: Diagnosis not present

## 2024-04-14 DIAGNOSIS — F01A0 Vascular dementia, mild, without behavioral disturbance, psychotic disturbance, mood disturbance, and anxiety: Secondary | ICD-10-CM | POA: Diagnosis not present

## 2024-04-14 DIAGNOSIS — I679 Cerebrovascular disease, unspecified: Secondary | ICD-10-CM | POA: Diagnosis not present

## 2024-04-15 DIAGNOSIS — I119 Hypertensive heart disease without heart failure: Secondary | ICD-10-CM | POA: Diagnosis not present

## 2024-04-15 DIAGNOSIS — E43 Unspecified severe protein-calorie malnutrition: Secondary | ICD-10-CM | POA: Diagnosis not present

## 2024-04-15 DIAGNOSIS — I679 Cerebrovascular disease, unspecified: Secondary | ICD-10-CM | POA: Diagnosis not present

## 2024-04-15 DIAGNOSIS — J449 Chronic obstructive pulmonary disease, unspecified: Secondary | ICD-10-CM | POA: Diagnosis not present

## 2024-04-15 DIAGNOSIS — E119 Type 2 diabetes mellitus without complications: Secondary | ICD-10-CM | POA: Diagnosis not present

## 2024-04-15 DIAGNOSIS — I2510 Atherosclerotic heart disease of native coronary artery without angina pectoris: Secondary | ICD-10-CM | POA: Diagnosis not present

## 2024-04-17 DIAGNOSIS — J449 Chronic obstructive pulmonary disease, unspecified: Secondary | ICD-10-CM | POA: Diagnosis not present

## 2024-04-17 DIAGNOSIS — E119 Type 2 diabetes mellitus without complications: Secondary | ICD-10-CM | POA: Diagnosis not present

## 2024-04-17 DIAGNOSIS — I119 Hypertensive heart disease without heart failure: Secondary | ICD-10-CM | POA: Diagnosis not present

## 2024-04-17 DIAGNOSIS — I679 Cerebrovascular disease, unspecified: Secondary | ICD-10-CM | POA: Diagnosis not present

## 2024-04-17 DIAGNOSIS — E43 Unspecified severe protein-calorie malnutrition: Secondary | ICD-10-CM | POA: Diagnosis not present

## 2024-04-17 DIAGNOSIS — I2510 Atherosclerotic heart disease of native coronary artery without angina pectoris: Secondary | ICD-10-CM | POA: Diagnosis not present

## 2024-04-19 DIAGNOSIS — I119 Hypertensive heart disease without heart failure: Secondary | ICD-10-CM | POA: Diagnosis not present

## 2024-04-19 DIAGNOSIS — J449 Chronic obstructive pulmonary disease, unspecified: Secondary | ICD-10-CM | POA: Diagnosis not present

## 2024-04-19 DIAGNOSIS — I679 Cerebrovascular disease, unspecified: Secondary | ICD-10-CM | POA: Diagnosis not present

## 2024-04-19 DIAGNOSIS — E43 Unspecified severe protein-calorie malnutrition: Secondary | ICD-10-CM | POA: Diagnosis not present

## 2024-04-19 DIAGNOSIS — I2510 Atherosclerotic heart disease of native coronary artery without angina pectoris: Secondary | ICD-10-CM | POA: Diagnosis not present

## 2024-04-19 DIAGNOSIS — E119 Type 2 diabetes mellitus without complications: Secondary | ICD-10-CM | POA: Diagnosis not present

## 2024-04-20 DIAGNOSIS — E43 Unspecified severe protein-calorie malnutrition: Secondary | ICD-10-CM | POA: Diagnosis not present

## 2024-04-20 DIAGNOSIS — E119 Type 2 diabetes mellitus without complications: Secondary | ICD-10-CM | POA: Diagnosis not present

## 2024-04-20 DIAGNOSIS — J449 Chronic obstructive pulmonary disease, unspecified: Secondary | ICD-10-CM | POA: Diagnosis not present

## 2024-04-20 DIAGNOSIS — I119 Hypertensive heart disease without heart failure: Secondary | ICD-10-CM | POA: Diagnosis not present

## 2024-04-20 DIAGNOSIS — I679 Cerebrovascular disease, unspecified: Secondary | ICD-10-CM | POA: Diagnosis not present

## 2024-04-20 DIAGNOSIS — I2510 Atherosclerotic heart disease of native coronary artery without angina pectoris: Secondary | ICD-10-CM | POA: Diagnosis not present

## 2024-04-21 DIAGNOSIS — I2510 Atherosclerotic heart disease of native coronary artery without angina pectoris: Secondary | ICD-10-CM | POA: Diagnosis not present

## 2024-04-21 DIAGNOSIS — I679 Cerebrovascular disease, unspecified: Secondary | ICD-10-CM | POA: Diagnosis not present

## 2024-04-21 DIAGNOSIS — E43 Unspecified severe protein-calorie malnutrition: Secondary | ICD-10-CM | POA: Diagnosis not present

## 2024-04-21 DIAGNOSIS — J449 Chronic obstructive pulmonary disease, unspecified: Secondary | ICD-10-CM | POA: Diagnosis not present

## 2024-04-21 DIAGNOSIS — I119 Hypertensive heart disease without heart failure: Secondary | ICD-10-CM | POA: Diagnosis not present

## 2024-04-21 DIAGNOSIS — R296 Repeated falls: Secondary | ICD-10-CM | POA: Diagnosis not present

## 2024-04-21 DIAGNOSIS — E119 Type 2 diabetes mellitus without complications: Secondary | ICD-10-CM | POA: Diagnosis not present

## 2024-04-23 DIAGNOSIS — E119 Type 2 diabetes mellitus without complications: Secondary | ICD-10-CM | POA: Diagnosis not present

## 2024-04-23 DIAGNOSIS — I679 Cerebrovascular disease, unspecified: Secondary | ICD-10-CM | POA: Diagnosis not present

## 2024-04-23 DIAGNOSIS — E43 Unspecified severe protein-calorie malnutrition: Secondary | ICD-10-CM | POA: Diagnosis not present

## 2024-04-23 DIAGNOSIS — I2510 Atherosclerotic heart disease of native coronary artery without angina pectoris: Secondary | ICD-10-CM | POA: Diagnosis not present

## 2024-04-23 DIAGNOSIS — I119 Hypertensive heart disease without heart failure: Secondary | ICD-10-CM | POA: Diagnosis not present

## 2024-04-23 DIAGNOSIS — J449 Chronic obstructive pulmonary disease, unspecified: Secondary | ICD-10-CM | POA: Diagnosis not present

## 2024-04-26 DIAGNOSIS — J449 Chronic obstructive pulmonary disease, unspecified: Secondary | ICD-10-CM | POA: Diagnosis not present

## 2024-04-26 DIAGNOSIS — E119 Type 2 diabetes mellitus without complications: Secondary | ICD-10-CM | POA: Diagnosis not present

## 2024-04-26 DIAGNOSIS — E43 Unspecified severe protein-calorie malnutrition: Secondary | ICD-10-CM | POA: Diagnosis not present

## 2024-04-26 DIAGNOSIS — I679 Cerebrovascular disease, unspecified: Secondary | ICD-10-CM | POA: Diagnosis not present

## 2024-04-26 DIAGNOSIS — I2510 Atherosclerotic heart disease of native coronary artery without angina pectoris: Secondary | ICD-10-CM | POA: Diagnosis not present

## 2024-04-26 DIAGNOSIS — I119 Hypertensive heart disease without heart failure: Secondary | ICD-10-CM | POA: Diagnosis not present

## 2024-04-27 DIAGNOSIS — E43 Unspecified severe protein-calorie malnutrition: Secondary | ICD-10-CM | POA: Diagnosis not present

## 2024-04-27 DIAGNOSIS — F32A Depression, unspecified: Secondary | ICD-10-CM | POA: Diagnosis not present

## 2024-04-27 DIAGNOSIS — R55 Syncope and collapse: Secondary | ICD-10-CM | POA: Diagnosis not present

## 2024-04-27 DIAGNOSIS — Z66 Do not resuscitate: Secondary | ICD-10-CM | POA: Diagnosis not present

## 2024-04-27 DIAGNOSIS — E119 Type 2 diabetes mellitus without complications: Secondary | ICD-10-CM | POA: Diagnosis not present

## 2024-04-27 DIAGNOSIS — I2510 Atherosclerotic heart disease of native coronary artery without angina pectoris: Secondary | ICD-10-CM | POA: Diagnosis not present

## 2024-04-27 DIAGNOSIS — R296 Repeated falls: Secondary | ICD-10-CM | POA: Diagnosis not present

## 2024-04-27 DIAGNOSIS — I679 Cerebrovascular disease, unspecified: Secondary | ICD-10-CM | POA: Diagnosis not present

## 2024-04-27 DIAGNOSIS — I619 Nontraumatic intracerebral hemorrhage, unspecified: Secondary | ICD-10-CM | POA: Diagnosis not present

## 2024-04-27 DIAGNOSIS — J449 Chronic obstructive pulmonary disease, unspecified: Secondary | ICD-10-CM | POA: Diagnosis not present

## 2024-04-27 DIAGNOSIS — I119 Hypertensive heart disease without heart failure: Secondary | ICD-10-CM | POA: Diagnosis not present

## 2024-04-28 DIAGNOSIS — E43 Unspecified severe protein-calorie malnutrition: Secondary | ICD-10-CM | POA: Diagnosis not present

## 2024-04-28 DIAGNOSIS — J449 Chronic obstructive pulmonary disease, unspecified: Secondary | ICD-10-CM | POA: Diagnosis not present

## 2024-04-28 DIAGNOSIS — I2510 Atherosclerotic heart disease of native coronary artery without angina pectoris: Secondary | ICD-10-CM | POA: Diagnosis not present

## 2024-04-28 DIAGNOSIS — E119 Type 2 diabetes mellitus without complications: Secondary | ICD-10-CM | POA: Diagnosis not present

## 2024-04-28 DIAGNOSIS — I679 Cerebrovascular disease, unspecified: Secondary | ICD-10-CM | POA: Diagnosis not present

## 2024-04-28 DIAGNOSIS — I119 Hypertensive heart disease without heart failure: Secondary | ICD-10-CM | POA: Diagnosis not present

## 2024-05-03 DIAGNOSIS — I679 Cerebrovascular disease, unspecified: Secondary | ICD-10-CM | POA: Diagnosis not present

## 2024-05-03 DIAGNOSIS — J449 Chronic obstructive pulmonary disease, unspecified: Secondary | ICD-10-CM | POA: Diagnosis not present

## 2024-05-03 DIAGNOSIS — I119 Hypertensive heart disease without heart failure: Secondary | ICD-10-CM | POA: Diagnosis not present

## 2024-05-03 DIAGNOSIS — E119 Type 2 diabetes mellitus without complications: Secondary | ICD-10-CM | POA: Diagnosis not present

## 2024-05-03 DIAGNOSIS — E43 Unspecified severe protein-calorie malnutrition: Secondary | ICD-10-CM | POA: Diagnosis not present

## 2024-05-03 DIAGNOSIS — I2510 Atherosclerotic heart disease of native coronary artery without angina pectoris: Secondary | ICD-10-CM | POA: Diagnosis not present

## 2024-05-05 DIAGNOSIS — E43 Unspecified severe protein-calorie malnutrition: Secondary | ICD-10-CM | POA: Diagnosis not present

## 2024-05-05 DIAGNOSIS — I679 Cerebrovascular disease, unspecified: Secondary | ICD-10-CM | POA: Diagnosis not present

## 2024-05-05 DIAGNOSIS — E119 Type 2 diabetes mellitus without complications: Secondary | ICD-10-CM | POA: Diagnosis not present

## 2024-05-05 DIAGNOSIS — I119 Hypertensive heart disease without heart failure: Secondary | ICD-10-CM | POA: Diagnosis not present

## 2024-05-05 DIAGNOSIS — J449 Chronic obstructive pulmonary disease, unspecified: Secondary | ICD-10-CM | POA: Diagnosis not present

## 2024-05-05 DIAGNOSIS — I2510 Atherosclerotic heart disease of native coronary artery without angina pectoris: Secondary | ICD-10-CM | POA: Diagnosis not present

## 2024-05-06 DIAGNOSIS — I119 Hypertensive heart disease without heart failure: Secondary | ICD-10-CM | POA: Diagnosis not present

## 2024-05-06 DIAGNOSIS — E119 Type 2 diabetes mellitus without complications: Secondary | ICD-10-CM | POA: Diagnosis not present

## 2024-05-06 DIAGNOSIS — J449 Chronic obstructive pulmonary disease, unspecified: Secondary | ICD-10-CM | POA: Diagnosis not present

## 2024-05-06 DIAGNOSIS — E43 Unspecified severe protein-calorie malnutrition: Secondary | ICD-10-CM | POA: Diagnosis not present

## 2024-05-06 DIAGNOSIS — I679 Cerebrovascular disease, unspecified: Secondary | ICD-10-CM | POA: Diagnosis not present

## 2024-05-06 DIAGNOSIS — I2510 Atherosclerotic heart disease of native coronary artery without angina pectoris: Secondary | ICD-10-CM | POA: Diagnosis not present

## 2024-05-10 DIAGNOSIS — I119 Hypertensive heart disease without heart failure: Secondary | ICD-10-CM | POA: Diagnosis not present

## 2024-05-10 DIAGNOSIS — I2510 Atherosclerotic heart disease of native coronary artery without angina pectoris: Secondary | ICD-10-CM | POA: Diagnosis not present

## 2024-05-10 DIAGNOSIS — E119 Type 2 diabetes mellitus without complications: Secondary | ICD-10-CM | POA: Diagnosis not present

## 2024-05-10 DIAGNOSIS — I679 Cerebrovascular disease, unspecified: Secondary | ICD-10-CM | POA: Diagnosis not present

## 2024-05-10 DIAGNOSIS — Z9181 History of falling: Secondary | ICD-10-CM | POA: Diagnosis not present

## 2024-05-10 DIAGNOSIS — E43 Unspecified severe protein-calorie malnutrition: Secondary | ICD-10-CM | POA: Diagnosis not present

## 2024-05-10 DIAGNOSIS — I10 Essential (primary) hypertension: Secondary | ICD-10-CM | POA: Diagnosis not present

## 2024-05-10 DIAGNOSIS — F331 Major depressive disorder, recurrent, moderate: Secondary | ICD-10-CM | POA: Diagnosis not present

## 2024-05-10 DIAGNOSIS — J449 Chronic obstructive pulmonary disease, unspecified: Secondary | ICD-10-CM | POA: Diagnosis not present

## 2024-05-12 DIAGNOSIS — R296 Repeated falls: Secondary | ICD-10-CM | POA: Diagnosis not present

## 2024-05-12 DIAGNOSIS — J449 Chronic obstructive pulmonary disease, unspecified: Secondary | ICD-10-CM | POA: Diagnosis not present

## 2024-05-12 DIAGNOSIS — E119 Type 2 diabetes mellitus without complications: Secondary | ICD-10-CM | POA: Diagnosis not present

## 2024-05-12 DIAGNOSIS — I119 Hypertensive heart disease without heart failure: Secondary | ICD-10-CM | POA: Diagnosis not present

## 2024-05-12 DIAGNOSIS — E43 Unspecified severe protein-calorie malnutrition: Secondary | ICD-10-CM | POA: Diagnosis not present

## 2024-05-12 DIAGNOSIS — I2510 Atherosclerotic heart disease of native coronary artery without angina pectoris: Secondary | ICD-10-CM | POA: Diagnosis not present

## 2024-05-12 DIAGNOSIS — I679 Cerebrovascular disease, unspecified: Secondary | ICD-10-CM | POA: Diagnosis not present

## 2024-05-14 DIAGNOSIS — I2510 Atherosclerotic heart disease of native coronary artery without angina pectoris: Secondary | ICD-10-CM | POA: Diagnosis not present

## 2024-05-14 DIAGNOSIS — E119 Type 2 diabetes mellitus without complications: Secondary | ICD-10-CM | POA: Diagnosis not present

## 2024-05-14 DIAGNOSIS — E43 Unspecified severe protein-calorie malnutrition: Secondary | ICD-10-CM | POA: Diagnosis not present

## 2024-05-14 DIAGNOSIS — I679 Cerebrovascular disease, unspecified: Secondary | ICD-10-CM | POA: Diagnosis not present

## 2024-05-14 DIAGNOSIS — I119 Hypertensive heart disease without heart failure: Secondary | ICD-10-CM | POA: Diagnosis not present

## 2024-05-14 DIAGNOSIS — J449 Chronic obstructive pulmonary disease, unspecified: Secondary | ICD-10-CM | POA: Diagnosis not present

## 2024-05-17 DIAGNOSIS — I2510 Atherosclerotic heart disease of native coronary artery without angina pectoris: Secondary | ICD-10-CM | POA: Diagnosis not present

## 2024-05-17 DIAGNOSIS — I679 Cerebrovascular disease, unspecified: Secondary | ICD-10-CM | POA: Diagnosis not present

## 2024-05-17 DIAGNOSIS — E43 Unspecified severe protein-calorie malnutrition: Secondary | ICD-10-CM | POA: Diagnosis not present

## 2024-05-17 DIAGNOSIS — I119 Hypertensive heart disease without heart failure: Secondary | ICD-10-CM | POA: Diagnosis not present

## 2024-05-17 DIAGNOSIS — E119 Type 2 diabetes mellitus without complications: Secondary | ICD-10-CM | POA: Diagnosis not present

## 2024-05-17 DIAGNOSIS — J449 Chronic obstructive pulmonary disease, unspecified: Secondary | ICD-10-CM | POA: Diagnosis not present

## 2024-05-19 DIAGNOSIS — J449 Chronic obstructive pulmonary disease, unspecified: Secondary | ICD-10-CM | POA: Diagnosis not present

## 2024-05-19 DIAGNOSIS — E43 Unspecified severe protein-calorie malnutrition: Secondary | ICD-10-CM | POA: Diagnosis not present

## 2024-05-19 DIAGNOSIS — F01A0 Vascular dementia, mild, without behavioral disturbance, psychotic disturbance, mood disturbance, and anxiety: Secondary | ICD-10-CM | POA: Diagnosis not present

## 2024-05-19 DIAGNOSIS — F5105 Insomnia due to other mental disorder: Secondary | ICD-10-CM | POA: Diagnosis not present

## 2024-05-19 DIAGNOSIS — I679 Cerebrovascular disease, unspecified: Secondary | ICD-10-CM | POA: Diagnosis not present

## 2024-05-19 DIAGNOSIS — E119 Type 2 diabetes mellitus without complications: Secondary | ICD-10-CM | POA: Diagnosis not present

## 2024-05-19 DIAGNOSIS — I2510 Atherosclerotic heart disease of native coronary artery without angina pectoris: Secondary | ICD-10-CM | POA: Diagnosis not present

## 2024-05-19 DIAGNOSIS — I10 Essential (primary) hypertension: Secondary | ICD-10-CM | POA: Diagnosis not present

## 2024-05-19 DIAGNOSIS — I119 Hypertensive heart disease without heart failure: Secondary | ICD-10-CM | POA: Diagnosis not present

## 2024-05-20 DIAGNOSIS — J449 Chronic obstructive pulmonary disease, unspecified: Secondary | ICD-10-CM | POA: Diagnosis not present

## 2024-05-20 DIAGNOSIS — I2510 Atherosclerotic heart disease of native coronary artery without angina pectoris: Secondary | ICD-10-CM | POA: Diagnosis not present

## 2024-05-20 DIAGNOSIS — E119 Type 2 diabetes mellitus without complications: Secondary | ICD-10-CM | POA: Diagnosis not present

## 2024-05-20 DIAGNOSIS — I119 Hypertensive heart disease without heart failure: Secondary | ICD-10-CM | POA: Diagnosis not present

## 2024-05-20 DIAGNOSIS — E43 Unspecified severe protein-calorie malnutrition: Secondary | ICD-10-CM | POA: Diagnosis not present

## 2024-05-20 DIAGNOSIS — I679 Cerebrovascular disease, unspecified: Secondary | ICD-10-CM | POA: Diagnosis not present

## 2024-05-22 DIAGNOSIS — E119 Type 2 diabetes mellitus without complications: Secondary | ICD-10-CM | POA: Diagnosis not present

## 2024-05-22 DIAGNOSIS — I679 Cerebrovascular disease, unspecified: Secondary | ICD-10-CM | POA: Diagnosis not present

## 2024-05-22 DIAGNOSIS — I2510 Atherosclerotic heart disease of native coronary artery without angina pectoris: Secondary | ICD-10-CM | POA: Diagnosis not present

## 2024-05-22 DIAGNOSIS — E43 Unspecified severe protein-calorie malnutrition: Secondary | ICD-10-CM | POA: Diagnosis not present

## 2024-05-22 DIAGNOSIS — J449 Chronic obstructive pulmonary disease, unspecified: Secondary | ICD-10-CM | POA: Diagnosis not present

## 2024-05-22 DIAGNOSIS — I119 Hypertensive heart disease without heart failure: Secondary | ICD-10-CM | POA: Diagnosis not present

## 2024-05-24 DIAGNOSIS — I2510 Atherosclerotic heart disease of native coronary artery without angina pectoris: Secondary | ICD-10-CM | POA: Diagnosis not present

## 2024-05-24 DIAGNOSIS — E43 Unspecified severe protein-calorie malnutrition: Secondary | ICD-10-CM | POA: Diagnosis not present

## 2024-05-24 DIAGNOSIS — J449 Chronic obstructive pulmonary disease, unspecified: Secondary | ICD-10-CM | POA: Diagnosis not present

## 2024-05-24 DIAGNOSIS — E119 Type 2 diabetes mellitus without complications: Secondary | ICD-10-CM | POA: Diagnosis not present

## 2024-05-24 DIAGNOSIS — I119 Hypertensive heart disease without heart failure: Secondary | ICD-10-CM | POA: Diagnosis not present

## 2024-05-24 DIAGNOSIS — I679 Cerebrovascular disease, unspecified: Secondary | ICD-10-CM | POA: Diagnosis not present

## 2024-05-28 DIAGNOSIS — Z66 Do not resuscitate: Secondary | ICD-10-CM | POA: Diagnosis not present

## 2024-05-28 DIAGNOSIS — F32A Depression, unspecified: Secondary | ICD-10-CM | POA: Diagnosis not present

## 2024-05-28 DIAGNOSIS — J449 Chronic obstructive pulmonary disease, unspecified: Secondary | ICD-10-CM | POA: Diagnosis not present

## 2024-05-28 DIAGNOSIS — I679 Cerebrovascular disease, unspecified: Secondary | ICD-10-CM | POA: Diagnosis not present

## 2024-05-28 DIAGNOSIS — I2510 Atherosclerotic heart disease of native coronary artery without angina pectoris: Secondary | ICD-10-CM | POA: Diagnosis not present

## 2024-05-28 DIAGNOSIS — I119 Hypertensive heart disease without heart failure: Secondary | ICD-10-CM | POA: Diagnosis not present

## 2024-05-28 DIAGNOSIS — I619 Nontraumatic intracerebral hemorrhage, unspecified: Secondary | ICD-10-CM | POA: Diagnosis not present

## 2024-05-28 DIAGNOSIS — E43 Unspecified severe protein-calorie malnutrition: Secondary | ICD-10-CM | POA: Diagnosis not present

## 2024-05-28 DIAGNOSIS — R55 Syncope and collapse: Secondary | ICD-10-CM | POA: Diagnosis not present

## 2024-05-28 DIAGNOSIS — R296 Repeated falls: Secondary | ICD-10-CM | POA: Diagnosis not present

## 2024-05-28 DIAGNOSIS — E119 Type 2 diabetes mellitus without complications: Secondary | ICD-10-CM | POA: Diagnosis not present

## 2024-06-02 DIAGNOSIS — I2510 Atherosclerotic heart disease of native coronary artery without angina pectoris: Secondary | ICD-10-CM | POA: Diagnosis not present

## 2024-06-02 DIAGNOSIS — I679 Cerebrovascular disease, unspecified: Secondary | ICD-10-CM | POA: Diagnosis not present

## 2024-06-02 DIAGNOSIS — E119 Type 2 diabetes mellitus without complications: Secondary | ICD-10-CM | POA: Diagnosis not present

## 2024-06-02 DIAGNOSIS — E43 Unspecified severe protein-calorie malnutrition: Secondary | ICD-10-CM | POA: Diagnosis not present

## 2024-06-02 DIAGNOSIS — I119 Hypertensive heart disease without heart failure: Secondary | ICD-10-CM | POA: Diagnosis not present

## 2024-06-02 DIAGNOSIS — J449 Chronic obstructive pulmonary disease, unspecified: Secondary | ICD-10-CM | POA: Diagnosis not present

## 2024-06-08 ENCOUNTER — Ambulatory Visit (INDEPENDENT_AMBULATORY_CARE_PROVIDER_SITE_OTHER)

## 2024-06-08 DIAGNOSIS — I679 Cerebrovascular disease, unspecified: Secondary | ICD-10-CM | POA: Diagnosis not present

## 2024-06-08 DIAGNOSIS — J449 Chronic obstructive pulmonary disease, unspecified: Secondary | ICD-10-CM | POA: Diagnosis not present

## 2024-06-08 DIAGNOSIS — E119 Type 2 diabetes mellitus without complications: Secondary | ICD-10-CM | POA: Diagnosis not present

## 2024-06-08 DIAGNOSIS — I119 Hypertensive heart disease without heart failure: Secondary | ICD-10-CM | POA: Diagnosis not present

## 2024-06-08 DIAGNOSIS — E43 Unspecified severe protein-calorie malnutrition: Secondary | ICD-10-CM | POA: Diagnosis not present

## 2024-06-08 DIAGNOSIS — I2510 Atherosclerotic heart disease of native coronary artery without angina pectoris: Secondary | ICD-10-CM | POA: Diagnosis not present

## 2024-06-09 DIAGNOSIS — E43 Unspecified severe protein-calorie malnutrition: Secondary | ICD-10-CM | POA: Diagnosis not present

## 2024-06-09 DIAGNOSIS — I2510 Atherosclerotic heart disease of native coronary artery without angina pectoris: Secondary | ICD-10-CM | POA: Diagnosis not present

## 2024-06-09 DIAGNOSIS — I679 Cerebrovascular disease, unspecified: Secondary | ICD-10-CM | POA: Diagnosis not present

## 2024-06-09 DIAGNOSIS — E119 Type 2 diabetes mellitus without complications: Secondary | ICD-10-CM | POA: Diagnosis not present

## 2024-06-09 DIAGNOSIS — J449 Chronic obstructive pulmonary disease, unspecified: Secondary | ICD-10-CM | POA: Diagnosis not present

## 2024-06-09 DIAGNOSIS — I119 Hypertensive heart disease without heart failure: Secondary | ICD-10-CM | POA: Diagnosis not present

## 2024-06-09 LAB — CUP PACEART REMOTE DEVICE CHECK
Battery Remaining Longevity: 83 mo
Battery Remaining Percentage: 87 %
Battery Voltage: 3.01 V
Brady Statistic AP VP Percent: 1 %
Brady Statistic AP VS Percent: 7.4 %
Brady Statistic AS VP Percent: 1 %
Brady Statistic AS VS Percent: 91 %
Brady Statistic RA Percent Paced: 5.7 %
Brady Statistic RV Percent Paced: 1 %
Date Time Interrogation Session: 20250813001429
Implantable Lead Connection Status: 753985
Implantable Lead Connection Status: 753985
Implantable Lead Implant Date: 20230706
Implantable Lead Implant Date: 20230706
Implantable Lead Location: 753859
Implantable Lead Location: 753860
Implantable Pulse Generator Implant Date: 20230706
Lead Channel Impedance Value: 440 Ohm
Lead Channel Impedance Value: 490 Ohm
Lead Channel Pacing Threshold Amplitude: 0.5 V
Lead Channel Pacing Threshold Amplitude: 0.5 V
Lead Channel Pacing Threshold Pulse Width: 0.3 ms
Lead Channel Pacing Threshold Pulse Width: 0.5 ms
Lead Channel Sensing Intrinsic Amplitude: 12 mV
Lead Channel Sensing Intrinsic Amplitude: 4.6 mV
Lead Channel Setting Pacing Amplitude: 3.5 V
Lead Channel Setting Pacing Amplitude: 3.5 V
Lead Channel Setting Pacing Pulse Width: 0.5 ms
Lead Channel Setting Sensing Sensitivity: 2 mV
Pulse Gen Model: 2272
Pulse Gen Serial Number: 8098905

## 2024-06-10 ENCOUNTER — Ambulatory Visit: Payer: Self-pay | Admitting: Cardiology

## 2024-06-10 DIAGNOSIS — I679 Cerebrovascular disease, unspecified: Secondary | ICD-10-CM | POA: Diagnosis not present

## 2024-06-10 DIAGNOSIS — I119 Hypertensive heart disease without heart failure: Secondary | ICD-10-CM | POA: Diagnosis not present

## 2024-06-10 DIAGNOSIS — J449 Chronic obstructive pulmonary disease, unspecified: Secondary | ICD-10-CM | POA: Diagnosis not present

## 2024-06-10 DIAGNOSIS — I2510 Atherosclerotic heart disease of native coronary artery without angina pectoris: Secondary | ICD-10-CM | POA: Diagnosis not present

## 2024-06-10 DIAGNOSIS — E43 Unspecified severe protein-calorie malnutrition: Secondary | ICD-10-CM | POA: Diagnosis not present

## 2024-06-10 DIAGNOSIS — E119 Type 2 diabetes mellitus without complications: Secondary | ICD-10-CM | POA: Diagnosis not present

## 2024-06-11 DIAGNOSIS — F5105 Insomnia due to other mental disorder: Secondary | ICD-10-CM | POA: Diagnosis not present

## 2024-06-11 DIAGNOSIS — F01A0 Vascular dementia, mild, without behavioral disturbance, psychotic disturbance, mood disturbance, and anxiety: Secondary | ICD-10-CM | POA: Diagnosis not present

## 2024-06-11 DIAGNOSIS — I10 Essential (primary) hypertension: Secondary | ICD-10-CM | POA: Diagnosis not present

## 2024-06-12 DIAGNOSIS — J449 Chronic obstructive pulmonary disease, unspecified: Secondary | ICD-10-CM | POA: Diagnosis not present

## 2024-06-12 DIAGNOSIS — E119 Type 2 diabetes mellitus without complications: Secondary | ICD-10-CM | POA: Diagnosis not present

## 2024-06-12 DIAGNOSIS — E43 Unspecified severe protein-calorie malnutrition: Secondary | ICD-10-CM | POA: Diagnosis not present

## 2024-06-12 DIAGNOSIS — I119 Hypertensive heart disease without heart failure: Secondary | ICD-10-CM | POA: Diagnosis not present

## 2024-06-12 DIAGNOSIS — I2510 Atherosclerotic heart disease of native coronary artery without angina pectoris: Secondary | ICD-10-CM | POA: Diagnosis not present

## 2024-06-12 DIAGNOSIS — I679 Cerebrovascular disease, unspecified: Secondary | ICD-10-CM | POA: Diagnosis not present

## 2024-06-14 DIAGNOSIS — E119 Type 2 diabetes mellitus without complications: Secondary | ICD-10-CM | POA: Diagnosis not present

## 2024-06-14 DIAGNOSIS — I679 Cerebrovascular disease, unspecified: Secondary | ICD-10-CM | POA: Diagnosis not present

## 2024-06-14 DIAGNOSIS — I2510 Atherosclerotic heart disease of native coronary artery without angina pectoris: Secondary | ICD-10-CM | POA: Diagnosis not present

## 2024-06-14 DIAGNOSIS — E43 Unspecified severe protein-calorie malnutrition: Secondary | ICD-10-CM | POA: Diagnosis not present

## 2024-06-14 DIAGNOSIS — J449 Chronic obstructive pulmonary disease, unspecified: Secondary | ICD-10-CM | POA: Diagnosis not present

## 2024-06-14 DIAGNOSIS — I119 Hypertensive heart disease without heart failure: Secondary | ICD-10-CM | POA: Diagnosis not present

## 2024-06-16 DIAGNOSIS — E119 Type 2 diabetes mellitus without complications: Secondary | ICD-10-CM | POA: Diagnosis not present

## 2024-06-16 DIAGNOSIS — I679 Cerebrovascular disease, unspecified: Secondary | ICD-10-CM | POA: Diagnosis not present

## 2024-06-16 DIAGNOSIS — I119 Hypertensive heart disease without heart failure: Secondary | ICD-10-CM | POA: Diagnosis not present

## 2024-06-16 DIAGNOSIS — I2510 Atherosclerotic heart disease of native coronary artery without angina pectoris: Secondary | ICD-10-CM | POA: Diagnosis not present

## 2024-06-16 DIAGNOSIS — J449 Chronic obstructive pulmonary disease, unspecified: Secondary | ICD-10-CM | POA: Diagnosis not present

## 2024-06-16 DIAGNOSIS — E43 Unspecified severe protein-calorie malnutrition: Secondary | ICD-10-CM | POA: Diagnosis not present

## 2024-06-19 DIAGNOSIS — I10 Essential (primary) hypertension: Secondary | ICD-10-CM | POA: Diagnosis not present

## 2024-06-19 DIAGNOSIS — W19XXXA Unspecified fall, initial encounter: Secondary | ICD-10-CM | POA: Diagnosis not present

## 2024-06-20 ENCOUNTER — Emergency Department (HOSPITAL_COMMUNITY)
Admission: EM | Admit: 2024-06-20 | Discharge: 2024-06-20 | Disposition: A | Discharge status: Home / Self Care | Source: Skilled Nursing Facility | Attending: Emergency Medicine | Admitting: Emergency Medicine

## 2024-06-20 ENCOUNTER — Other Ambulatory Visit: Payer: Self-pay

## 2024-06-20 ENCOUNTER — Emergency Department (HOSPITAL_COMMUNITY)

## 2024-06-20 ENCOUNTER — Encounter (HOSPITAL_COMMUNITY): Payer: Self-pay | Admitting: Emergency Medicine

## 2024-06-20 DIAGNOSIS — Z7401 Bed confinement status: Secondary | ICD-10-CM | POA: Diagnosis not present

## 2024-06-20 DIAGNOSIS — I6782 Cerebral ischemia: Secondary | ICD-10-CM | POA: Diagnosis not present

## 2024-06-20 DIAGNOSIS — R220 Localized swelling, mass and lump, head: Secondary | ICD-10-CM | POA: Diagnosis not present

## 2024-06-20 DIAGNOSIS — Y92129 Unspecified place in nursing home as the place of occurrence of the external cause: Secondary | ICD-10-CM | POA: Insufficient documentation

## 2024-06-20 DIAGNOSIS — R404 Transient alteration of awareness: Secondary | ICD-10-CM | POA: Diagnosis not present

## 2024-06-20 DIAGNOSIS — J439 Emphysema, unspecified: Secondary | ICD-10-CM | POA: Diagnosis not present

## 2024-06-20 DIAGNOSIS — S0101XA Laceration without foreign body of scalp, initial encounter: Secondary | ICD-10-CM | POA: Diagnosis not present

## 2024-06-20 DIAGNOSIS — W19XXXA Unspecified fall, initial encounter: Secondary | ICD-10-CM | POA: Diagnosis not present

## 2024-06-20 DIAGNOSIS — S199XXA Unspecified injury of neck, initial encounter: Secondary | ICD-10-CM | POA: Diagnosis not present

## 2024-06-20 DIAGNOSIS — M4801 Spinal stenosis, occipito-atlanto-axial region: Secondary | ICD-10-CM | POA: Diagnosis not present

## 2024-06-20 DIAGNOSIS — M47812 Spondylosis without myelopathy or radiculopathy, cervical region: Secondary | ICD-10-CM | POA: Diagnosis not present

## 2024-06-20 DIAGNOSIS — S0990XA Unspecified injury of head, initial encounter: Secondary | ICD-10-CM | POA: Diagnosis not present

## 2024-06-20 MED ORDER — LIDOCAINE-EPINEPHRINE-TETRACAINE (LET) TOPICAL GEL
3.0000 mL | Freq: Once | TOPICAL | Status: AC
  Administered 2024-06-20: 3 mL via TOPICAL

## 2024-06-20 NOTE — ED Provider Notes (Signed)
 [place] EMERGENCY DEPARTMENT AT [facility] Provider Note   CSN: [id number] Arrival date & time: 06/20/24  9985     Patient presents with: [name]   CHANSON TEEMS is a 83 y.o. male.   The history is provided by the EMS personnel. The history is limited by the condition of the patient (level 5 caveat dementia).  Fall This is a new problem. The current episode started less than 1 hour ago. The problem occurs constantly. The problem has been resolved. Pertinent negatives include no chest pain, no abdominal pain, no headaches and no shortness of breath. Nothing aggravates the symptoms. Nothing relieves the symptoms. He has tried nothing for the symptoms. The treatment provided no relief.       Prior to Admission medications   Medication Sig Start Date End Date Taking? Authorizing Provider  acetaminophen  (TYLENOL ) 500 MG tablet Take 2 tablets (1,000 mg total) by mouth every 6 (six) hours as needed. 12/03/23   Augustus Almarie RAMAN, PA-C  docusate sodium  (COLACE) 100 MG capsule Take 1 capsule (100 mg total) by mouth 2 (two) times daily. 12/03/23   Augustus Almarie RAMAN, PA-C  oxyCODONE  (OXY IR/ROXICODONE ) 5 MG immediate release tablet Take 0.5 tablets (2.5 mg total) by mouth every 4 (four) hours as needed for severe pain (pain score 7-10). 12/03/23   Augustus Almarie RAMAN, PA-C  polyethylene glycol (MIRALAX  / GLYCOLAX ) 17 g packet Take 17 g by mouth 2 (two) times daily. 12/03/23   Augustus Almarie RAMAN, PA-C  PRESCRIPTION MEDICATION Take 1 tablet by mouth daily as needed (sinus congestion). Rx medication for sinus congestion    [provider]  PRESCRIPTION MEDICATION Take 1 tablet by mouth daily as needed (runny nose). Rx medication to dry sinuses    [provider]    Allergies: Penicillins    Review of Systems  Respiratory:  Negative for shortness of breath.   Cardiovascular:  Negative for chest pain.  Gastrointestinal:  Negative for abdominal pain.  Skin:  Positive for  wound.  Neurological:  Negative for headaches.  All other systems reviewed and are negative.   Updated Vital Signs BP (!) 165/88   Pulse 70   Temp 97.7 F (36.5 C) (Oral)   Resp 16   Ht 5' 9 (1.753 m)   Wt 63.5 kg   SpO2 99%   BMI 20.67 kg/m   Physical Exam Vitals and nursing note reviewed.  Constitutional:      General: He is not in acute distress.    Appearance: He is well-developed. He is not diaphoretic.  HENT:     Head: Normocephalic.   Eyes:     Conjunctiva/sclera: Conjunctivae normal.     Pupils: Pupils are equal, round, and reactive to light.  Cardiovascular:     Rate and Rhythm: Normal rate and regular rhythm.  Pulmonary:     Effort: Pulmonary effort is normal.     Breath sounds: Normal breath sounds. No wheezing or rales.  Abdominal:     General: Bowel sounds are normal.     Palpations: Abdomen is soft.     Tenderness: There is no abdominal tenderness. There is no guarding or rebound.  Musculoskeletal:        General: Normal range of motion.     Right wrist: No bony tenderness, snuff box tenderness or crepitus.     Left wrist: No bony tenderness, snuff box tenderness or crepitus.     Right hand: Normal.     Left hand:  Normal.     Cervical back: Normal, normal range of motion and neck supple. No tenderness.     Thoracic back: Normal.     Lumbar back: Normal.     Right hip: Normal.     Left hip: Normal.  Skin:    General: Skin is warm and dry.     Capillary Refill: Capillary refill takes less than 2 seconds.  Neurological:     General: No focal deficit present.     Mental Status: He is alert.     Deep Tendon Reflexes: Reflexes normal.     (all labs ordered are listed, but only abnormal results are displayed) Labs Reviewed - No data to display  EKG: None  Radiology: CT Cervical Spine Wo Contrast Result Date: 06/20/2024 CLINICAL DATA:  Status post trauma. EXAM: CT CERVICAL SPINE WITHOUT CONTRAST TECHNIQUE: Multidetector CT imaging of the  cervical spine was performed without intravenous contrast. Multiplanar CT image reconstructions were also generated. RADIATION DOSE REDUCTION: This exam was performed according to the departmental dose-optimization program which includes automated exposure control, adjustment of the mA and/or kV according to patient size and/or use of iterative reconstruction technique. COMPARISON:  November 25, 2023 FINDINGS: Alignment: There is 2 mm anterolisthesis of the C4 vertebral body on C5. 1 mm to 2 mm retrolisthesis of the C6 vertebral body is noted on C7. Skull base and vertebrae: No acute fracture. No primary bone lesion or focal pathologic process. Soft tissues and spinal canal: No prevertebral fluid or swelling. No visible canal hematoma. Disc levels: Marked severity endplate sclerosis, anterior osteophyte formation and posterior bony spurring are seen at the levels of C5-C6 and C6-C7. Mild anterior osteophyte formation is seen at the levels of C3-C4 and C4-C5. There is marked severity narrowing of the anterior atlantoaxial articulation. Marked severity intervertebral disc space narrowing is seen at C5-C6, C6-C7 and C7-T1. Bilateral marked severity multilevel facet joint hypertrophy is noted. Upper chest: There is evidence of marked severity emphysematous lung disease. Other: None. IMPRESSION: 1. No acute fracture or traumatic subluxation. 2. Marked severity multilevel degenerative changes, as described above. 3. Severe emphysematous lung disease. Electronically Signed   By: Suzen Dials M.D.   On: 06/20/2024 01:06   CT Head Wo Contrast Result Date: 06/20/2024 CLINICAL DATA:  Status post trauma. EXAM: CT HEAD WITHOUT CONTRAST TECHNIQUE: Contiguous axial images were obtained from the base of the skull through the vertex without intravenous contrast. RADIATION DOSE REDUCTION: This exam was performed according to the departmental dose-optimization program which includes automated exposure control, adjustment of the  mA and/or kV according to patient size and/or use of iterative reconstruction technique. COMPARISON:  December 31, 2023 FINDINGS: Brain: There is generalized cerebral atrophy with widening of the extra-axial spaces and ventricular dilatation. There are areas of decreased attenuation within the white matter tracts of the supratentorial brain, consistent with microvascular disease changes. Vascular: Moderate severity bilateral cavernous carotid artery calcification is noted. Skull: Normal. Negative for fracture or focal lesion. Sinuses/Orbits: There is marked severity right maxillary sinus mucosal thickening. Other: Mild scalp soft tissue swelling is seen along the lateral aspect of the vertex on the right. IMPRESSION: 1. Generalized cerebral atrophy with chronic white matter small vessel ischemic changes. 2. No acute intracranial abnormality. 3. Marked severity right maxillary sinus disease. 4. Mild scalp soft tissue swelling along the lateral aspect of the vertex on the right. Electronically Signed   By: Suzen Dials M.D.   On: 06/20/2024 01:01     .Laceration Repair  Date/Time: 06/20/2024 1:54 AM  Performed by: Nettie Earing, MD Authorized by: Nettie Earing, MD   Consent:    Consent obtained:  Verbal   Consent given by:  Patient   Risks discussed:  Infection, need for additional repair and nerve damage   Alternatives discussed:  Delayed treatment Universal protocol:    Imaging studies available: yes     Patient identity confirmed:  Arm band Anesthesia:    Anesthesia method:  Topical application   Topical anesthetic:  LET Laceration details:    Location:  Scalp   Length (cm):  2.8   Depth (mm):  1 Exploration:    Wound exploration: wound explored through full range of motion     Wound extent: fascia not violated and no foreign body     Contaminated: no   Treatment:    Area cleansed with:  Povidone-iodine, chlorhexidine  and saline   Amount of cleaning:  Extensive   Irrigation  solution:  Sterile saline   Debridement:  None Skin repair:    Repair method:  Staples   Number of staples:  5 Approximation:    Approximation:  Close Repair type:    Repair type:  Simple Post-procedure details:    Dressing:  Open (no dressing)   Procedure completion:  Tolerated well, no immediate complications    Medications Ordered in the ED  lidocaine -EPINEPHrine -tetracaine  (LET) topical gel (3 mLs Topical Given 06/20/24 0035)                                    Medical Decision Making Fall at nursing home   Amount and/or Complexity of Data Reviewed Independent Historian: EMS    Details: See above  External Data Reviewed: notes.    Details: Previous notes reviewed  Radiology: ordered and independent interpretation performed.    Details: No traumatic injury   Risk Risk Details: Staple removal in 5 days. Stable for discharge with close follow up       Final diagnoses:  None    No signs of systemic illness or infection. The patient is nontoxic-appearing on exam and vital signs are within normal limits.  I have reviewed the triage vital signs and the nursing notes. Pertinent labs & imaging results that were available during my care of the patient were reviewed by me and considered in my medical decision making (see chart for details). After history, exam, and medical workup I feel the patient has been appropriately medically screened and is safe for discharge home. Pertinent diagnoses were discussed with the patient. Patient was given return precautions.    ED Discharge Orders     None          Garrett Mitchum, MD 06/20/24 0202

## 2024-06-20 NOTE — ED Triage Notes (Signed)
 Patient arrives via [facility] from [facility] for mechanical fall. Laceration and hematoma noted to back of head. No blood thinners. C collar in place. HX of dementia and per facility he is at mental status baseline. Denies pain.

## 2024-06-20 NOTE — ED Notes (Signed)
 Pt discharged. Pt in NAD at this time. Pt taken back home via [facility]

## 2024-06-20 NOTE — ED Notes (Signed)
 Patient transported to CT

## 2024-06-21 DIAGNOSIS — E43 Unspecified severe protein-calorie malnutrition: Secondary | ICD-10-CM | POA: Diagnosis not present

## 2024-06-21 DIAGNOSIS — E119 Type 2 diabetes mellitus without complications: Secondary | ICD-10-CM | POA: Diagnosis not present

## 2024-06-21 DIAGNOSIS — I119 Hypertensive heart disease without heart failure: Secondary | ICD-10-CM | POA: Diagnosis not present

## 2024-06-21 DIAGNOSIS — I2510 Atherosclerotic heart disease of native coronary artery without angina pectoris: Secondary | ICD-10-CM | POA: Diagnosis not present

## 2024-06-21 DIAGNOSIS — I679 Cerebrovascular disease, unspecified: Secondary | ICD-10-CM | POA: Diagnosis not present

## 2024-06-21 DIAGNOSIS — J449 Chronic obstructive pulmonary disease, unspecified: Secondary | ICD-10-CM | POA: Diagnosis not present

## 2024-06-22 DIAGNOSIS — I2510 Atherosclerotic heart disease of native coronary artery without angina pectoris: Secondary | ICD-10-CM | POA: Diagnosis not present

## 2024-06-22 DIAGNOSIS — J449 Chronic obstructive pulmonary disease, unspecified: Secondary | ICD-10-CM | POA: Diagnosis not present

## 2024-06-22 DIAGNOSIS — I679 Cerebrovascular disease, unspecified: Secondary | ICD-10-CM | POA: Diagnosis not present

## 2024-06-22 DIAGNOSIS — E43 Unspecified severe protein-calorie malnutrition: Secondary | ICD-10-CM | POA: Diagnosis not present

## 2024-06-22 DIAGNOSIS — E119 Type 2 diabetes mellitus without complications: Secondary | ICD-10-CM | POA: Diagnosis not present

## 2024-06-22 DIAGNOSIS — I119 Hypertensive heart disease without heart failure: Secondary | ICD-10-CM | POA: Diagnosis not present

## 2024-06-23 DIAGNOSIS — I119 Hypertensive heart disease without heart failure: Secondary | ICD-10-CM | POA: Diagnosis not present

## 2024-06-23 DIAGNOSIS — I2510 Atherosclerotic heart disease of native coronary artery without angina pectoris: Secondary | ICD-10-CM | POA: Diagnosis not present

## 2024-06-23 DIAGNOSIS — E43 Unspecified severe protein-calorie malnutrition: Secondary | ICD-10-CM | POA: Diagnosis not present

## 2024-06-23 DIAGNOSIS — E119 Type 2 diabetes mellitus without complications: Secondary | ICD-10-CM | POA: Diagnosis not present

## 2024-06-23 DIAGNOSIS — I679 Cerebrovascular disease, unspecified: Secondary | ICD-10-CM | POA: Diagnosis not present

## 2024-06-23 DIAGNOSIS — J449 Chronic obstructive pulmonary disease, unspecified: Secondary | ICD-10-CM | POA: Diagnosis not present

## 2024-06-25 DIAGNOSIS — I10 Essential (primary) hypertension: Secondary | ICD-10-CM | POA: Diagnosis not present

## 2024-06-25 DIAGNOSIS — F01A0 Vascular dementia, mild, without behavioral disturbance, psychotic disturbance, mood disturbance, and anxiety: Secondary | ICD-10-CM | POA: Diagnosis not present

## 2024-06-25 DIAGNOSIS — R296 Repeated falls: Secondary | ICD-10-CM | POA: Diagnosis not present

## 2024-06-28 DIAGNOSIS — Z66 Do not resuscitate: Secondary | ICD-10-CM | POA: Diagnosis not present

## 2024-06-28 DIAGNOSIS — I679 Cerebrovascular disease, unspecified: Secondary | ICD-10-CM | POA: Diagnosis not present

## 2024-06-28 DIAGNOSIS — E119 Type 2 diabetes mellitus without complications: Secondary | ICD-10-CM | POA: Diagnosis not present

## 2024-06-28 DIAGNOSIS — R55 Syncope and collapse: Secondary | ICD-10-CM | POA: Diagnosis not present

## 2024-06-28 DIAGNOSIS — I619 Nontraumatic intracerebral hemorrhage, unspecified: Secondary | ICD-10-CM | POA: Diagnosis not present

## 2024-06-28 DIAGNOSIS — E43 Unspecified severe protein-calorie malnutrition: Secondary | ICD-10-CM | POA: Diagnosis not present

## 2024-06-28 DIAGNOSIS — J449 Chronic obstructive pulmonary disease, unspecified: Secondary | ICD-10-CM | POA: Diagnosis not present

## 2024-06-28 DIAGNOSIS — F32A Depression, unspecified: Secondary | ICD-10-CM | POA: Diagnosis not present

## 2024-06-28 DIAGNOSIS — I119 Hypertensive heart disease without heart failure: Secondary | ICD-10-CM | POA: Diagnosis not present

## 2024-06-28 DIAGNOSIS — I2510 Atherosclerotic heart disease of native coronary artery without angina pectoris: Secondary | ICD-10-CM | POA: Diagnosis not present

## 2024-06-28 DIAGNOSIS — R296 Repeated falls: Secondary | ICD-10-CM | POA: Diagnosis not present

## 2024-06-29 DIAGNOSIS — I2510 Atherosclerotic heart disease of native coronary artery without angina pectoris: Secondary | ICD-10-CM | POA: Diagnosis not present

## 2024-06-29 DIAGNOSIS — I679 Cerebrovascular disease, unspecified: Secondary | ICD-10-CM | POA: Diagnosis not present

## 2024-06-29 DIAGNOSIS — J449 Chronic obstructive pulmonary disease, unspecified: Secondary | ICD-10-CM | POA: Diagnosis not present

## 2024-06-29 DIAGNOSIS — E43 Unspecified severe protein-calorie malnutrition: Secondary | ICD-10-CM | POA: Diagnosis not present

## 2024-06-29 DIAGNOSIS — E119 Type 2 diabetes mellitus without complications: Secondary | ICD-10-CM | POA: Diagnosis not present

## 2024-06-29 DIAGNOSIS — I119 Hypertensive heart disease without heart failure: Secondary | ICD-10-CM | POA: Diagnosis not present

## 2024-06-30 DIAGNOSIS — J449 Chronic obstructive pulmonary disease, unspecified: Secondary | ICD-10-CM | POA: Diagnosis not present

## 2024-06-30 DIAGNOSIS — I679 Cerebrovascular disease, unspecified: Secondary | ICD-10-CM | POA: Diagnosis not present

## 2024-06-30 DIAGNOSIS — E119 Type 2 diabetes mellitus without complications: Secondary | ICD-10-CM | POA: Diagnosis not present

## 2024-06-30 DIAGNOSIS — I119 Hypertensive heart disease without heart failure: Secondary | ICD-10-CM | POA: Diagnosis not present

## 2024-06-30 DIAGNOSIS — E43 Unspecified severe protein-calorie malnutrition: Secondary | ICD-10-CM | POA: Diagnosis not present

## 2024-06-30 DIAGNOSIS — I2510 Atherosclerotic heart disease of native coronary artery without angina pectoris: Secondary | ICD-10-CM | POA: Diagnosis not present

## 2024-07-02 DIAGNOSIS — I679 Cerebrovascular disease, unspecified: Secondary | ICD-10-CM | POA: Diagnosis not present

## 2024-07-02 DIAGNOSIS — J449 Chronic obstructive pulmonary disease, unspecified: Secondary | ICD-10-CM | POA: Diagnosis not present

## 2024-07-02 DIAGNOSIS — I119 Hypertensive heart disease without heart failure: Secondary | ICD-10-CM | POA: Diagnosis not present

## 2024-07-02 DIAGNOSIS — E119 Type 2 diabetes mellitus without complications: Secondary | ICD-10-CM | POA: Diagnosis not present

## 2024-07-02 DIAGNOSIS — I2510 Atherosclerotic heart disease of native coronary artery without angina pectoris: Secondary | ICD-10-CM | POA: Diagnosis not present

## 2024-07-02 DIAGNOSIS — E43 Unspecified severe protein-calorie malnutrition: Secondary | ICD-10-CM | POA: Diagnosis not present

## 2024-07-06 DIAGNOSIS — I679 Cerebrovascular disease, unspecified: Secondary | ICD-10-CM | POA: Diagnosis not present

## 2024-07-06 DIAGNOSIS — J449 Chronic obstructive pulmonary disease, unspecified: Secondary | ICD-10-CM | POA: Diagnosis not present

## 2024-07-06 DIAGNOSIS — I2510 Atherosclerotic heart disease of native coronary artery without angina pectoris: Secondary | ICD-10-CM | POA: Diagnosis not present

## 2024-07-06 DIAGNOSIS — I119 Hypertensive heart disease without heart failure: Secondary | ICD-10-CM | POA: Diagnosis not present

## 2024-07-06 DIAGNOSIS — E119 Type 2 diabetes mellitus without complications: Secondary | ICD-10-CM | POA: Diagnosis not present

## 2024-07-06 DIAGNOSIS — E43 Unspecified severe protein-calorie malnutrition: Secondary | ICD-10-CM | POA: Diagnosis not present

## 2024-07-07 DIAGNOSIS — E43 Unspecified severe protein-calorie malnutrition: Secondary | ICD-10-CM | POA: Diagnosis not present

## 2024-07-07 DIAGNOSIS — J449 Chronic obstructive pulmonary disease, unspecified: Secondary | ICD-10-CM | POA: Diagnosis not present

## 2024-07-07 DIAGNOSIS — I119 Hypertensive heart disease without heart failure: Secondary | ICD-10-CM | POA: Diagnosis not present

## 2024-07-07 DIAGNOSIS — I679 Cerebrovascular disease, unspecified: Secondary | ICD-10-CM | POA: Diagnosis not present

## 2024-07-07 DIAGNOSIS — E119 Type 2 diabetes mellitus without complications: Secondary | ICD-10-CM | POA: Diagnosis not present

## 2024-07-07 DIAGNOSIS — I2510 Atherosclerotic heart disease of native coronary artery without angina pectoris: Secondary | ICD-10-CM | POA: Diagnosis not present

## 2024-07-08 DIAGNOSIS — I119 Hypertensive heart disease without heart failure: Secondary | ICD-10-CM | POA: Diagnosis not present

## 2024-07-08 DIAGNOSIS — I679 Cerebrovascular disease, unspecified: Secondary | ICD-10-CM | POA: Diagnosis not present

## 2024-07-08 DIAGNOSIS — E119 Type 2 diabetes mellitus without complications: Secondary | ICD-10-CM | POA: Diagnosis not present

## 2024-07-08 DIAGNOSIS — I2510 Atherosclerotic heart disease of native coronary artery without angina pectoris: Secondary | ICD-10-CM | POA: Diagnosis not present

## 2024-07-08 DIAGNOSIS — J449 Chronic obstructive pulmonary disease, unspecified: Secondary | ICD-10-CM | POA: Diagnosis not present

## 2024-07-08 DIAGNOSIS — E43 Unspecified severe protein-calorie malnutrition: Secondary | ICD-10-CM | POA: Diagnosis not present

## 2024-07-09 DIAGNOSIS — I679 Cerebrovascular disease, unspecified: Secondary | ICD-10-CM | POA: Diagnosis not present

## 2024-07-09 DIAGNOSIS — E119 Type 2 diabetes mellitus without complications: Secondary | ICD-10-CM | POA: Diagnosis not present

## 2024-07-09 DIAGNOSIS — E43 Unspecified severe protein-calorie malnutrition: Secondary | ICD-10-CM | POA: Diagnosis not present

## 2024-07-09 DIAGNOSIS — I119 Hypertensive heart disease without heart failure: Secondary | ICD-10-CM | POA: Diagnosis not present

## 2024-07-09 DIAGNOSIS — R296 Repeated falls: Secondary | ICD-10-CM | POA: Diagnosis not present

## 2024-07-09 DIAGNOSIS — F01A0 Vascular dementia, mild, without behavioral disturbance, psychotic disturbance, mood disturbance, and anxiety: Secondary | ICD-10-CM | POA: Diagnosis not present

## 2024-07-09 DIAGNOSIS — J449 Chronic obstructive pulmonary disease, unspecified: Secondary | ICD-10-CM | POA: Diagnosis not present

## 2024-07-09 DIAGNOSIS — I2510 Atherosclerotic heart disease of native coronary artery without angina pectoris: Secondary | ICD-10-CM | POA: Diagnosis not present

## 2024-07-09 DIAGNOSIS — I10 Essential (primary) hypertension: Secondary | ICD-10-CM | POA: Diagnosis not present

## 2024-07-13 DIAGNOSIS — I119 Hypertensive heart disease without heart failure: Secondary | ICD-10-CM | POA: Diagnosis not present

## 2024-07-13 DIAGNOSIS — I2510 Atherosclerotic heart disease of native coronary artery without angina pectoris: Secondary | ICD-10-CM | POA: Diagnosis not present

## 2024-07-13 DIAGNOSIS — E119 Type 2 diabetes mellitus without complications: Secondary | ICD-10-CM | POA: Diagnosis not present

## 2024-07-13 DIAGNOSIS — E43 Unspecified severe protein-calorie malnutrition: Secondary | ICD-10-CM | POA: Diagnosis not present

## 2024-07-13 DIAGNOSIS — I679 Cerebrovascular disease, unspecified: Secondary | ICD-10-CM | POA: Diagnosis not present

## 2024-07-13 DIAGNOSIS — J449 Chronic obstructive pulmonary disease, unspecified: Secondary | ICD-10-CM | POA: Diagnosis not present

## 2024-07-15 DIAGNOSIS — I679 Cerebrovascular disease, unspecified: Secondary | ICD-10-CM | POA: Diagnosis not present

## 2024-07-15 DIAGNOSIS — E119 Type 2 diabetes mellitus without complications: Secondary | ICD-10-CM | POA: Diagnosis not present

## 2024-07-15 DIAGNOSIS — E43 Unspecified severe protein-calorie malnutrition: Secondary | ICD-10-CM | POA: Diagnosis not present

## 2024-07-15 DIAGNOSIS — I2510 Atherosclerotic heart disease of native coronary artery without angina pectoris: Secondary | ICD-10-CM | POA: Diagnosis not present

## 2024-07-15 DIAGNOSIS — I119 Hypertensive heart disease without heart failure: Secondary | ICD-10-CM | POA: Diagnosis not present

## 2024-07-15 DIAGNOSIS — J449 Chronic obstructive pulmonary disease, unspecified: Secondary | ICD-10-CM | POA: Diagnosis not present

## 2024-07-16 DIAGNOSIS — E119 Type 2 diabetes mellitus without complications: Secondary | ICD-10-CM | POA: Diagnosis not present

## 2024-07-16 DIAGNOSIS — J449 Chronic obstructive pulmonary disease, unspecified: Secondary | ICD-10-CM | POA: Diagnosis not present

## 2024-07-16 DIAGNOSIS — I2510 Atherosclerotic heart disease of native coronary artery without angina pectoris: Secondary | ICD-10-CM | POA: Diagnosis not present

## 2024-07-16 DIAGNOSIS — E43 Unspecified severe protein-calorie malnutrition: Secondary | ICD-10-CM | POA: Diagnosis not present

## 2024-07-16 DIAGNOSIS — I679 Cerebrovascular disease, unspecified: Secondary | ICD-10-CM | POA: Diagnosis not present

## 2024-07-16 DIAGNOSIS — I119 Hypertensive heart disease without heart failure: Secondary | ICD-10-CM | POA: Diagnosis not present

## 2024-07-19 DIAGNOSIS — I10 Essential (primary) hypertension: Secondary | ICD-10-CM | POA: Diagnosis not present

## 2024-07-19 DIAGNOSIS — F01A0 Vascular dementia, mild, without behavioral disturbance, psychotic disturbance, mood disturbance, and anxiety: Secondary | ICD-10-CM | POA: Diagnosis not present

## 2024-07-19 DIAGNOSIS — R918 Other nonspecific abnormal finding of lung field: Secondary | ICD-10-CM | POA: Diagnosis not present

## 2024-07-19 DIAGNOSIS — R296 Repeated falls: Secondary | ICD-10-CM | POA: Diagnosis not present

## 2024-07-19 DIAGNOSIS — J449 Chronic obstructive pulmonary disease, unspecified: Secondary | ICD-10-CM | POA: Diagnosis not present

## 2024-07-20 DIAGNOSIS — J449 Chronic obstructive pulmonary disease, unspecified: Secondary | ICD-10-CM | POA: Diagnosis not present

## 2024-07-20 DIAGNOSIS — I2510 Atherosclerotic heart disease of native coronary artery without angina pectoris: Secondary | ICD-10-CM | POA: Diagnosis not present

## 2024-07-20 DIAGNOSIS — E43 Unspecified severe protein-calorie malnutrition: Secondary | ICD-10-CM | POA: Diagnosis not present

## 2024-07-20 DIAGNOSIS — I679 Cerebrovascular disease, unspecified: Secondary | ICD-10-CM | POA: Diagnosis not present

## 2024-07-20 DIAGNOSIS — E119 Type 2 diabetes mellitus without complications: Secondary | ICD-10-CM | POA: Diagnosis not present

## 2024-07-20 DIAGNOSIS — I119 Hypertensive heart disease without heart failure: Secondary | ICD-10-CM | POA: Diagnosis not present

## 2024-07-21 DIAGNOSIS — I679 Cerebrovascular disease, unspecified: Secondary | ICD-10-CM | POA: Diagnosis not present

## 2024-07-21 DIAGNOSIS — E119 Type 2 diabetes mellitus without complications: Secondary | ICD-10-CM | POA: Diagnosis not present

## 2024-07-21 DIAGNOSIS — I2510 Atherosclerotic heart disease of native coronary artery without angina pectoris: Secondary | ICD-10-CM | POA: Diagnosis not present

## 2024-07-21 DIAGNOSIS — J449 Chronic obstructive pulmonary disease, unspecified: Secondary | ICD-10-CM | POA: Diagnosis not present

## 2024-07-21 DIAGNOSIS — I119 Hypertensive heart disease without heart failure: Secondary | ICD-10-CM | POA: Diagnosis not present

## 2024-07-21 DIAGNOSIS — E43 Unspecified severe protein-calorie malnutrition: Secondary | ICD-10-CM | POA: Diagnosis not present

## 2024-07-22 NOTE — Progress Notes (Signed)
 Remote PPM Transmission

## 2024-07-23 DIAGNOSIS — E43 Unspecified severe protein-calorie malnutrition: Secondary | ICD-10-CM | POA: Diagnosis not present

## 2024-07-23 DIAGNOSIS — I679 Cerebrovascular disease, unspecified: Secondary | ICD-10-CM | POA: Diagnosis not present

## 2024-07-23 DIAGNOSIS — I2510 Atherosclerotic heart disease of native coronary artery without angina pectoris: Secondary | ICD-10-CM | POA: Diagnosis not present

## 2024-07-23 DIAGNOSIS — I119 Hypertensive heart disease without heart failure: Secondary | ICD-10-CM | POA: Diagnosis not present

## 2024-07-23 DIAGNOSIS — J449 Chronic obstructive pulmonary disease, unspecified: Secondary | ICD-10-CM | POA: Diagnosis not present

## 2024-07-23 DIAGNOSIS — E119 Type 2 diabetes mellitus without complications: Secondary | ICD-10-CM | POA: Diagnosis not present

## 2024-07-26 DIAGNOSIS — E119 Type 2 diabetes mellitus without complications: Secondary | ICD-10-CM | POA: Diagnosis not present

## 2024-07-26 DIAGNOSIS — E43 Unspecified severe protein-calorie malnutrition: Secondary | ICD-10-CM | POA: Diagnosis not present

## 2024-07-26 DIAGNOSIS — I119 Hypertensive heart disease without heart failure: Secondary | ICD-10-CM | POA: Diagnosis not present

## 2024-07-26 DIAGNOSIS — J449 Chronic obstructive pulmonary disease, unspecified: Secondary | ICD-10-CM | POA: Diagnosis not present

## 2024-07-26 DIAGNOSIS — I679 Cerebrovascular disease, unspecified: Secondary | ICD-10-CM | POA: Diagnosis not present

## 2024-07-26 DIAGNOSIS — I2510 Atherosclerotic heart disease of native coronary artery without angina pectoris: Secondary | ICD-10-CM | POA: Diagnosis not present

## 2024-07-28 DIAGNOSIS — J449 Chronic obstructive pulmonary disease, unspecified: Secondary | ICD-10-CM | POA: Diagnosis not present

## 2024-07-28 DIAGNOSIS — Z66 Do not resuscitate: Secondary | ICD-10-CM | POA: Diagnosis not present

## 2024-07-28 DIAGNOSIS — R296 Repeated falls: Secondary | ICD-10-CM | POA: Diagnosis not present

## 2024-07-28 DIAGNOSIS — I679 Cerebrovascular disease, unspecified: Secondary | ICD-10-CM | POA: Diagnosis not present

## 2024-07-28 DIAGNOSIS — I119 Hypertensive heart disease without heart failure: Secondary | ICD-10-CM | POA: Diagnosis not present

## 2024-07-28 DIAGNOSIS — E43 Unspecified severe protein-calorie malnutrition: Secondary | ICD-10-CM | POA: Diagnosis not present

## 2024-07-28 DIAGNOSIS — R55 Syncope and collapse: Secondary | ICD-10-CM | POA: Diagnosis not present

## 2024-07-28 DIAGNOSIS — F32A Depression, unspecified: Secondary | ICD-10-CM | POA: Diagnosis not present

## 2024-07-28 DIAGNOSIS — E118 Type 2 diabetes mellitus with unspecified complications: Secondary | ICD-10-CM | POA: Diagnosis not present

## 2024-07-28 DIAGNOSIS — I2510 Atherosclerotic heart disease of native coronary artery without angina pectoris: Secondary | ICD-10-CM | POA: Diagnosis not present

## 2024-07-28 DIAGNOSIS — I619 Nontraumatic intracerebral hemorrhage, unspecified: Secondary | ICD-10-CM | POA: Diagnosis not present

## 2024-07-30 DIAGNOSIS — J449 Chronic obstructive pulmonary disease, unspecified: Secondary | ICD-10-CM | POA: Diagnosis not present

## 2024-07-30 DIAGNOSIS — F32A Depression, unspecified: Secondary | ICD-10-CM | POA: Diagnosis not present

## 2024-07-30 DIAGNOSIS — I119 Hypertensive heart disease without heart failure: Secondary | ICD-10-CM | POA: Diagnosis not present

## 2024-07-30 DIAGNOSIS — E43 Unspecified severe protein-calorie malnutrition: Secondary | ICD-10-CM | POA: Diagnosis not present

## 2024-07-30 DIAGNOSIS — I679 Cerebrovascular disease, unspecified: Secondary | ICD-10-CM | POA: Diagnosis not present

## 2024-07-30 DIAGNOSIS — I2510 Atherosclerotic heart disease of native coronary artery without angina pectoris: Secondary | ICD-10-CM | POA: Diagnosis not present

## 2024-08-03 DIAGNOSIS — F32A Depression, unspecified: Secondary | ICD-10-CM | POA: Diagnosis not present

## 2024-08-03 DIAGNOSIS — I2510 Atherosclerotic heart disease of native coronary artery without angina pectoris: Secondary | ICD-10-CM | POA: Diagnosis not present

## 2024-08-03 DIAGNOSIS — I119 Hypertensive heart disease without heart failure: Secondary | ICD-10-CM | POA: Diagnosis not present

## 2024-08-03 DIAGNOSIS — E43 Unspecified severe protein-calorie malnutrition: Secondary | ICD-10-CM | POA: Diagnosis not present

## 2024-08-03 DIAGNOSIS — I679 Cerebrovascular disease, unspecified: Secondary | ICD-10-CM | POA: Diagnosis not present

## 2024-08-03 DIAGNOSIS — J449 Chronic obstructive pulmonary disease, unspecified: Secondary | ICD-10-CM | POA: Diagnosis not present

## 2024-08-05 DIAGNOSIS — E43 Unspecified severe protein-calorie malnutrition: Secondary | ICD-10-CM | POA: Diagnosis not present

## 2024-08-05 DIAGNOSIS — F32A Depression, unspecified: Secondary | ICD-10-CM | POA: Diagnosis not present

## 2024-08-05 DIAGNOSIS — I679 Cerebrovascular disease, unspecified: Secondary | ICD-10-CM | POA: Diagnosis not present

## 2024-08-05 DIAGNOSIS — I119 Hypertensive heart disease without heart failure: Secondary | ICD-10-CM | POA: Diagnosis not present

## 2024-08-05 DIAGNOSIS — J449 Chronic obstructive pulmonary disease, unspecified: Secondary | ICD-10-CM | POA: Diagnosis not present

## 2024-08-05 DIAGNOSIS — I2510 Atherosclerotic heart disease of native coronary artery without angina pectoris: Secondary | ICD-10-CM | POA: Diagnosis not present

## 2024-08-06 DIAGNOSIS — F32A Depression, unspecified: Secondary | ICD-10-CM | POA: Diagnosis not present

## 2024-08-06 DIAGNOSIS — I679 Cerebrovascular disease, unspecified: Secondary | ICD-10-CM | POA: Diagnosis not present

## 2024-08-06 DIAGNOSIS — E43 Unspecified severe protein-calorie malnutrition: Secondary | ICD-10-CM | POA: Diagnosis not present

## 2024-08-06 DIAGNOSIS — I119 Hypertensive heart disease without heart failure: Secondary | ICD-10-CM | POA: Diagnosis not present

## 2024-08-06 DIAGNOSIS — J449 Chronic obstructive pulmonary disease, unspecified: Secondary | ICD-10-CM | POA: Diagnosis not present

## 2024-08-06 DIAGNOSIS — I2510 Atherosclerotic heart disease of native coronary artery without angina pectoris: Secondary | ICD-10-CM | POA: Diagnosis not present

## 2024-08-10 DIAGNOSIS — J449 Chronic obstructive pulmonary disease, unspecified: Secondary | ICD-10-CM | POA: Diagnosis not present

## 2024-08-10 DIAGNOSIS — F32A Depression, unspecified: Secondary | ICD-10-CM | POA: Diagnosis not present

## 2024-08-10 DIAGNOSIS — E43 Unspecified severe protein-calorie malnutrition: Secondary | ICD-10-CM | POA: Diagnosis not present

## 2024-08-10 DIAGNOSIS — I679 Cerebrovascular disease, unspecified: Secondary | ICD-10-CM | POA: Diagnosis not present

## 2024-08-10 DIAGNOSIS — I2510 Atherosclerotic heart disease of native coronary artery without angina pectoris: Secondary | ICD-10-CM | POA: Diagnosis not present

## 2024-08-10 DIAGNOSIS — I119 Hypertensive heart disease without heart failure: Secondary | ICD-10-CM | POA: Diagnosis not present

## 2024-08-11 DIAGNOSIS — J449 Chronic obstructive pulmonary disease, unspecified: Secondary | ICD-10-CM | POA: Diagnosis not present

## 2024-08-11 DIAGNOSIS — I119 Hypertensive heart disease without heart failure: Secondary | ICD-10-CM | POA: Diagnosis not present

## 2024-08-11 DIAGNOSIS — F32A Depression, unspecified: Secondary | ICD-10-CM | POA: Diagnosis not present

## 2024-08-11 DIAGNOSIS — E43 Unspecified severe protein-calorie malnutrition: Secondary | ICD-10-CM | POA: Diagnosis not present

## 2024-08-11 DIAGNOSIS — I2510 Atherosclerotic heart disease of native coronary artery without angina pectoris: Secondary | ICD-10-CM | POA: Diagnosis not present

## 2024-08-11 DIAGNOSIS — I679 Cerebrovascular disease, unspecified: Secondary | ICD-10-CM | POA: Diagnosis not present

## 2024-08-12 DIAGNOSIS — I679 Cerebrovascular disease, unspecified: Secondary | ICD-10-CM | POA: Diagnosis not present

## 2024-08-12 DIAGNOSIS — E43 Unspecified severe protein-calorie malnutrition: Secondary | ICD-10-CM | POA: Diagnosis not present

## 2024-08-12 DIAGNOSIS — F32A Depression, unspecified: Secondary | ICD-10-CM | POA: Diagnosis not present

## 2024-08-12 DIAGNOSIS — I119 Hypertensive heart disease without heart failure: Secondary | ICD-10-CM | POA: Diagnosis not present

## 2024-08-12 DIAGNOSIS — J449 Chronic obstructive pulmonary disease, unspecified: Secondary | ICD-10-CM | POA: Diagnosis not present

## 2024-08-12 DIAGNOSIS — I2510 Atherosclerotic heart disease of native coronary artery without angina pectoris: Secondary | ICD-10-CM | POA: Diagnosis not present

## 2024-08-13 DIAGNOSIS — F01A0 Vascular dementia, mild, without behavioral disturbance, psychotic disturbance, mood disturbance, and anxiety: Secondary | ICD-10-CM | POA: Diagnosis not present

## 2024-08-13 DIAGNOSIS — I10 Essential (primary) hypertension: Secondary | ICD-10-CM | POA: Diagnosis not present

## 2024-08-13 DIAGNOSIS — R296 Repeated falls: Secondary | ICD-10-CM | POA: Diagnosis not present

## 2024-08-14 ENCOUNTER — Emergency Department (HOSPITAL_COMMUNITY)

## 2024-08-14 ENCOUNTER — Emergency Department (HOSPITAL_COMMUNITY)
Admission: EM | Admit: 2024-08-14 | Discharge: 2024-08-14 | Disposition: A | Discharge status: Home / Self Care | Attending: Emergency Medicine | Admitting: Emergency Medicine

## 2024-08-14 ENCOUNTER — Other Ambulatory Visit: Payer: Self-pay

## 2024-08-14 ENCOUNTER — Encounter (HOSPITAL_COMMUNITY): Payer: Self-pay

## 2024-08-14 DIAGNOSIS — W01198A Fall on same level from slipping, tripping and stumbling with subsequent striking against other object, initial encounter: Secondary | ICD-10-CM | POA: Insufficient documentation

## 2024-08-14 DIAGNOSIS — Z043 Encounter for examination and observation following other accident: Secondary | ICD-10-CM | POA: Insufficient documentation

## 2024-08-14 DIAGNOSIS — F0390 Unspecified dementia without behavioral disturbance: Secondary | ICD-10-CM | POA: Insufficient documentation

## 2024-08-14 DIAGNOSIS — I2510 Atherosclerotic heart disease of native coronary artery without angina pectoris: Secondary | ICD-10-CM | POA: Insufficient documentation

## 2024-08-14 DIAGNOSIS — J45909 Unspecified asthma, uncomplicated: Secondary | ICD-10-CM | POA: Insufficient documentation

## 2024-08-14 DIAGNOSIS — W19XXXA Unspecified fall, initial encounter: Secondary | ICD-10-CM | POA: Diagnosis not present

## 2024-08-14 DIAGNOSIS — D72829 Elevated white blood cell count, unspecified: Secondary | ICD-10-CM | POA: Diagnosis not present

## 2024-08-14 DIAGNOSIS — S0990XA Unspecified injury of head, initial encounter: Secondary | ICD-10-CM | POA: Diagnosis not present

## 2024-08-14 DIAGNOSIS — I10 Essential (primary) hypertension: Secondary | ICD-10-CM | POA: Insufficient documentation

## 2024-08-14 DIAGNOSIS — E119 Type 2 diabetes mellitus without complications: Secondary | ICD-10-CM | POA: Diagnosis not present

## 2024-08-14 LAB — CBC WITH DIFFERENTIAL/PLATELET
Abs Immature Granulocytes: 0.04 K/uL (ref 0.00–0.07)
Basophils Absolute: 0.1 K/uL (ref 0.0–0.1)
Basophils Relative: 0 %
Eosinophils Absolute: 0.1 K/uL (ref 0.0–0.5)
Eosinophils Relative: 1 %
HCT: 44.2 % (ref 39.0–52.0)
Hemoglobin: 14 g/dL (ref 13.0–17.0)
Immature Granulocytes: 0 %
Lymphocytes Relative: 9 %
Lymphs Abs: 1.1 K/uL (ref 0.7–4.0)
MCH: 30.1 pg (ref 26.0–34.0)
MCHC: 31.7 g/dL (ref 30.0–36.0)
MCV: 95.1 fL (ref 80.0–100.0)
Monocytes Absolute: 1.1 K/uL — ABNORMAL HIGH (ref 0.1–1.0)
Monocytes Relative: 9 %
Neutro Abs: 9.5 K/uL — ABNORMAL HIGH (ref 1.7–7.7)
Neutrophils Relative %: 81 %
Platelets: 224 K/uL (ref 150–400)
RBC: 4.65 MIL/uL (ref 4.22–5.81)
RDW: 14.5 % (ref 11.5–15.5)
WBC: 12 K/uL — ABNORMAL HIGH (ref 4.0–10.5)
nRBC: 0 % (ref 0.0–0.2)

## 2024-08-14 LAB — COMPREHENSIVE METABOLIC PANEL WITH GFR
ALT: 12 U/L (ref 0–44)
AST: 35 U/L (ref 15–41)
Albumin: 3.7 g/dL (ref 3.5–5.0)
Alkaline Phosphatase: 76 U/L (ref 38–126)
Anion gap: 10 (ref 5–15)
BUN: 22 mg/dL (ref 8–23)
CO2: 23 mmol/L (ref 22–32)
Calcium: 9.4 mg/dL (ref 8.9–10.3)
Chloride: 104 mmol/L (ref 98–111)
Creatinine, Ser: 1.1 mg/dL (ref 0.61–1.24)
GFR, Estimated: >60 mL/min (ref >60)
Glucose, Bld: 99 mg/dL (ref 70–99)
Potassium: 4.6 mmol/L (ref 3.5–5.1)
Sodium: 137 mmol/L (ref 135–145)
Total Bilirubin: 1 mg/dL (ref 0.0–1.2)
Total Protein: 7.3 g/dL (ref 6.5–8.1)

## 2024-08-14 LAB — URINALYSIS, W/ REFLEX TO CULTURE (INFECTION SUSPECTED)
Bacteria, UA: NONE SEEN
Bilirubin Urine: NEGATIVE
Glucose, UA: NEGATIVE mg/dL
Hgb urine dipstick: NEGATIVE
Ketones, ur: NEGATIVE mg/dL
Leukocytes,Ua: NEGATIVE
Nitrite: NEGATIVE
Protein, ur: 30 mg/dL — AB
Specific Gravity, Urine: 1.018 (ref 1.005–1.030)
pH: 5 (ref 5.0–8.0)

## 2024-08-14 LAB — CK: Total CK: 238 U/L (ref 49–397)

## 2024-08-14 NOTE — ED Notes (Signed)
 Attempted to call report x2 to facility no answer will attempt again [phone]

## 2024-08-14 NOTE — ED Notes (Signed)
 Per NT, patient observed getting out of bed. Pt assisted back into bed. Fall precautions in place (fall risk band, floor mat, slip-resistant socks, bed alarm, and fall risk sign outside of patient door). Patient redirected and asked to remain in bed and educated on safety risks.

## 2024-08-14 NOTE — ED Notes (Signed)
 Called [facility] still waiting

## 2024-08-14 NOTE — ED Notes (Signed)
 Bed alarm went off. Pt. found by this NT almost at the edge of the bed, immediately assisted the pt. back into the bed.

## 2024-08-14 NOTE — ED Notes (Signed)
 Pt ambulated to BR with X1 assist. Tolerated well. Cleansed of fecal incontinence. Clean brief placed on patient.

## 2024-08-14 NOTE — ED Provider Notes (Signed)
 [place] EMERGENCY DEPARTMENT AT [facility] Provider Note   CSN: [id number] Arrival date & time: 08/14/24  1004     Patient presents with: [name] is a 83 y.o. male.  {Add pertinent medical, surgical, social history, OB history to YEP:67052} HPI       Past Medical History:  Diagnosis Date   Asthma    as a child   Carotid arterial disease    a. 11/2015 Carotid U/S: LICA 40-59%, RICA 100 CTO, nl subclavian arteries--f/u 1 yr.   Coronary atherosclerosis of native coronary artery    a. 09/2001 inflat MI/PCI: RCA 69m (3.0x[address] S7 BMS); b. 07/2006 Cath: LM nl, LAD 50p, LCX min irregs, OM1 large, min irregs, OM2 min irregs, RCA 20 ISR;  c. 10/2012 MV: EF 74%, no ischemia.   Essential hypertension    Heart murmur    since a child   Hyperlipidemia    Myocardial infarction [facility]) 2002   Pneumonia    age 62   Right inguinal hernia     Prior to Admission medications   Medication Sig Start Date End Date Taking? Authorizing Provider  acetaminophen  (TYLENOL ) 500 MG tablet Take 2 tablets (1,000 mg total) by mouth every 6 (six) hours as needed. 12/03/23   Augustus Almarie RAMAN, PA-C  docusate sodium  (COLACE) 100 MG capsule Take 1 capsule (100 mg total) by mouth 2 (two) times daily. 12/03/23   Augustus Almarie RAMAN, PA-C  oxyCODONE  (OXY IR/ROXICODONE ) 5 MG immediate release tablet Take 0.5 tablets (2.5 mg total) by mouth every 4 (four) hours as needed for severe pain (pain score 7-10). 12/03/23   Augustus Almarie RAMAN, PA-C  polyethylene glycol (MIRALAX  / GLYCOLAX ) 17 g packet Take 17 g by mouth 2 (two) times daily. 12/03/23   Augustus Almarie RAMAN, PA-C  PRESCRIPTION MEDICATION Take 1 tablet by mouth daily as needed (sinus congestion). Rx medication for sinus congestion    [provider]  PRESCRIPTION MEDICATION Take 1 tablet by mouth daily as needed (runny nose). Rx medication to dry sinuses    [provider]    Allergies: Penicillins    Review of  Systems  Updated Vital Signs BP (!) 160/70 (BP Location: Left Arm)   Pulse 87   Temp 98.7 F (37.1 C) (Oral)   Resp 18   Ht 5' 9 (1.753 m)   Wt 64.9 kg   SpO2 100%   BMI 21.12 kg/m   Physical Exam  (all labs ordered are listed, but only abnormal results are displayed) Labs Reviewed  CBC WITH DIFFERENTIAL/PLATELET - Abnormal; Notable for the following components:      Result Value   WBC 12.0 (*)    Neutro Abs 9.5 (*)    Monocytes Absolute 1.1 (*)    All other components within normal limits  URINALYSIS, W/ REFLEX TO CULTURE (INFECTION SUSPECTED) - Abnormal; Notable for the following components:   Protein, ur 30 (*)    All other components within normal limits  COMPREHENSIVE METABOLIC PANEL WITH GFR  CK    EKG: EKG Interpretation Date/Time:  Saturday August 14 2024 11:22:44 EDT Ventricular Rate:  90 PR Interval:  168 QRS Duration:  108 QT Interval:  362 QTC Calculation: 442 R Axis:   -41  Text Interpretation: Sinus rhythm with marked sinus arrhythmia Left axis deviation Minimal voltage criteria for LVH, may be normal variant ( Cornell product ) Possible Anterior infarct , age undetermined Abnormal ECG When compared with ECG of  25-Nov-2023 17:44, artifact present/baseline wander, otherwise do not suspect significant difference compared to prior Confirmed by Dreama Longs [phone]) on 08/14/2024 11:45:06 AM  Radiology: CT Head Wo Contrast Result Date: 08/14/2024 CLINICAL DATA:  [place]lemens.  Hit head. EXAM: CT HEAD WITHOUT CONTRAST CT CERVICAL SPINE WITHOUT CONTRAST TECHNIQUE: Multidetector CT imaging of the head and cervical spine was performed following the standard protocol without intravenous contrast. Multiplanar CT image reconstructions of the cervical spine were also generated. RADIATION DOSE REDUCTION: This exam was performed according to the departmental dose-optimization program which includes automated exposure control, adjustment of the mA and/or kV according to  patient size and/or use of iterative reconstruction technique. COMPARISON:  Multiple prior CT examinations. The most recent study is 06/20/2024 FINDINGS: CT HEAD FINDINGS Brain: Stable age related cerebral atrophy, ventriculomegaly and periventricular white matter disease. No extra-axial fluid collections are identified. No CT findings for acute hemispheric infarction or intracranial hemorrhage. No mass lesions. The brainstem and cerebellum are normal. Vascular: Stable vascular calcifications. No aneurysm or hyperdense vessels. Skull: No acute skull fracture or bone lesions. Sinuses/Orbits: Chronic opacification of the right maxillary sinus and scattered ethmoid sinus mucoperiosteal thickening. The mastoid air cells are clear. Small amount of residual fluid in the right half of the sphenoid sinus. The globes are intact. Other: No scalp lesion or scalp hematoma. CT CERVICAL SPINE FINDINGS Alignment: Stable degenerative cervical spondylosis with multilevel degenerative subluxations. Skull base and vertebrae: Degenerative changes at C1-2 with calcified pannus but no significant mass effect on the upper cervical cord. No acute cervical spine fractures are identified. Severe multilevel disc disease and facet disease. The facets are fused at C2-3 bilaterally. Soft tissues and spinal canal: No prevertebral fluid or swelling. No visible canal hematoma. Disc levels: The spinal canal is fairly generous. No significant canal stenosis. Uncinate spurring and facet disease contribute to multilevel bony foraminal narrowing which is unchanged. Upper chest: The lung apices are stable. Severe emphysematous changes pulmonary scarring. Stable vascular calcifications. Other: No neck mass, adenopathy or hematoma. IMPRESSION: 1. Stable age related cerebral atrophy, ventriculomegaly and periventricular white matter disease. 2. No acute intracranial findings or skull fracture. 3. Sinus disease. 4. Stable degenerative cervical spondylosis  with multilevel degenerative subluxations but no acute cervical spine fracture. Electronically Signed   By: MYRTIS Stammer M.D.   On: 08/14/2024 11:09   CT Cervical Spine Wo Contrast Result Date: 08/14/2024 CLINICAL DATA:  Clemens.  Hit head. EXAM: CT HEAD WITHOUT CONTRAST CT CERVICAL SPINE WITHOUT CONTRAST TECHNIQUE: Multidetector CT imaging of the head and cervical spine was performed following the standard protocol without intravenous contrast. Multiplanar CT image reconstructions of the cervical spine were also generated. RADIATION DOSE REDUCTION: This exam was performed according to the departmental dose-optimization program which includes automated exposure control, adjustment of the mA and/or kV according to patient size and/or use of iterative reconstruction technique. COMPARISON:  Multiple prior CT examinations. The most recent study is 06/20/2024 FINDINGS: CT HEAD FINDINGS Brain: Stable age related cerebral atrophy, ventriculomegaly and periventricular white matter disease. No extra-axial fluid collections are identified. No CT findings for acute hemispheric infarction or intracranial hemorrhage. No mass lesions. The brainstem and cerebellum are normal. Vascular: Stable vascular calcifications. No aneurysm or hyperdense vessels. Skull: No acute skull fracture or bone lesions. Sinuses/Orbits: Chronic opacification of the right maxillary sinus and scattered ethmoid sinus mucoperiosteal thickening. The mastoid air cells are clear. Small amount of residual fluid in the right half of the sphenoid sinus. The globes are intact. Other: No scalp  lesion or scalp hematoma. CT CERVICAL SPINE FINDINGS Alignment: Stable degenerative cervical spondylosis with multilevel degenerative subluxations. Skull base and vertebrae: Degenerative changes at C1-2 with calcified pannus but no significant mass effect on the upper cervical cord. No acute cervical spine fractures are identified. Severe multilevel disc disease and facet  disease. The facets are fused at C2-3 bilaterally. Soft tissues and spinal canal: No prevertebral fluid or swelling. No visible canal hematoma. Disc levels: The spinal canal is fairly generous. No significant canal stenosis. Uncinate spurring and facet disease contribute to multilevel bony foraminal narrowing which is unchanged. Upper chest: The lung apices are stable. Severe emphysematous changes pulmonary scarring. Stable vascular calcifications. Other: No neck mass, adenopathy or hematoma. IMPRESSION: 1. Stable age related cerebral atrophy, ventriculomegaly and periventricular white matter disease. 2. No acute intracranial findings or skull fracture. 3. Sinus disease. 4. Stable degenerative cervical spondylosis with multilevel degenerative subluxations but no acute cervical spine fracture. Electronically Signed   By: MYRTIS Stammer M.D.   On: 08/14/2024 11:09    {Document cardiac monitor, telemetry assessment procedure when appropriate:32947} Procedures   Medications Ordered in the ED - No data to display    {Click here for ABCD2, HEART and other calculators REFRESH Note before signing:1}                              Medical Decision Making Amount and/or Complexity of Data Reviewed Labs: ordered. Radiology: ordered.   ***  {Document critical care time when appropriate  Document review of labs and clinical decision tools ie CHADS2VASC2, etc  Document your independent review of radiology images and any outside records  Document your discussion with family members, caretakers and with consultants  Document social determinants of health affecting pt's care  Document your decision making why or why not admission, treatments were needed:32947:::1}   Final diagnoses:  None    ED Discharge Orders     None

## 2024-08-14 NOTE — ED Notes (Signed)
 Pt given graham crackers and PB and ginger ale.

## 2024-08-14 NOTE — ED Provider Triage Note (Signed)
 Emergency Medicine Provider Triage Evaluation Note  [name] , a 83 y.o. male  was evaluated in triage.  Pt complains of unwitnessed fall this morning from [facility] facility.  Patient reports hitting the back of his head.  He was found on the floor in the bathroom on his back.  No loss conscious, no blood thinners.  Alert and oriented to self, place, situation but not to time.  History of dementia.  Initially had some back pain, at this time has no complaints.  He does not recall falling..  Review of Systems  Positive: Fall, head injury Negative:   Physical Exam  BP (!) 160/70 (BP Location: Left Arm)   Pulse 87   Temp 98.7 F (37.1 C) (Oral)   Resp 18   Ht 5' 9 (1.753 m)   Wt 64.9 kg   SpO2 100%   BMI 21.12 kg/m  Gen:   Awake, no distress   Resp:  Normal effort  MSK:   Moves extremities without difficulty  Other:  Moving all 4 limbs spontaneously, no evidence of trauma on posterior scalp, no tenderness to palpation of extremities, abdomen  Medical Decision Making  Medically screening exam initiated at 11:09 AM.  Appropriate orders placed.  LEOPOLD SMYERS was informed that the remainder of the evaluation will be completed by another provider, this initial triage assessment does not replace that evaluation, and the importance of remaining in the ED until their evaluation is complete.  Workup initiated in triage    Rosan Sherlean DEL, [place] 08/14/24 1109

## 2024-08-14 NOTE — ED Triage Notes (Signed)
 Pt bib ems from [facility] c/o unwitnessed fall this morning. Timeframe unclear. Pt states he hit the back of head. Pt was found on floor in bathroom on his back. No LOC or no thinners.  Baseline Aox3 and disoriented to time (Dementia)  Pt initially c/o back pain that have resolved.   BP 164/82 HR 74 RR 18 RA 96% CBG 114

## 2024-08-14 NOTE — ED Notes (Signed)
 [facility] called for patient transport back to [facility].

## 2024-08-17 DIAGNOSIS — I2510 Atherosclerotic heart disease of native coronary artery without angina pectoris: Secondary | ICD-10-CM | POA: Diagnosis not present

## 2024-08-17 DIAGNOSIS — J449 Chronic obstructive pulmonary disease, unspecified: Secondary | ICD-10-CM | POA: Diagnosis not present

## 2024-08-17 DIAGNOSIS — I679 Cerebrovascular disease, unspecified: Secondary | ICD-10-CM | POA: Diagnosis not present

## 2024-08-17 DIAGNOSIS — E43 Unspecified severe protein-calorie malnutrition: Secondary | ICD-10-CM | POA: Diagnosis not present

## 2024-08-17 DIAGNOSIS — I119 Hypertensive heart disease without heart failure: Secondary | ICD-10-CM | POA: Diagnosis not present

## 2024-08-17 DIAGNOSIS — F32A Depression, unspecified: Secondary | ICD-10-CM | POA: Diagnosis not present

## 2024-08-19 DIAGNOSIS — I119 Hypertensive heart disease without heart failure: Secondary | ICD-10-CM | POA: Diagnosis not present

## 2024-08-19 DIAGNOSIS — I2510 Atherosclerotic heart disease of native coronary artery without angina pectoris: Secondary | ICD-10-CM | POA: Diagnosis not present

## 2024-08-19 DIAGNOSIS — E43 Unspecified severe protein-calorie malnutrition: Secondary | ICD-10-CM | POA: Diagnosis not present

## 2024-08-19 DIAGNOSIS — I679 Cerebrovascular disease, unspecified: Secondary | ICD-10-CM | POA: Diagnosis not present

## 2024-08-19 DIAGNOSIS — J449 Chronic obstructive pulmonary disease, unspecified: Secondary | ICD-10-CM | POA: Diagnosis not present

## 2024-08-19 DIAGNOSIS — F32A Depression, unspecified: Secondary | ICD-10-CM | POA: Diagnosis not present

## 2024-08-24 DIAGNOSIS — E43 Unspecified severe protein-calorie malnutrition: Secondary | ICD-10-CM | POA: Diagnosis not present

## 2024-08-24 DIAGNOSIS — I2510 Atherosclerotic heart disease of native coronary artery without angina pectoris: Secondary | ICD-10-CM | POA: Diagnosis not present

## 2024-08-24 DIAGNOSIS — I679 Cerebrovascular disease, unspecified: Secondary | ICD-10-CM | POA: Diagnosis not present

## 2024-08-24 DIAGNOSIS — F32A Depression, unspecified: Secondary | ICD-10-CM | POA: Diagnosis not present

## 2024-08-24 DIAGNOSIS — J449 Chronic obstructive pulmonary disease, unspecified: Secondary | ICD-10-CM | POA: Diagnosis not present

## 2024-08-24 DIAGNOSIS — I119 Hypertensive heart disease without heart failure: Secondary | ICD-10-CM | POA: Diagnosis not present

## 2024-08-25 DIAGNOSIS — F32A Depression, unspecified: Secondary | ICD-10-CM | POA: Diagnosis not present

## 2024-08-25 DIAGNOSIS — I679 Cerebrovascular disease, unspecified: Secondary | ICD-10-CM | POA: Diagnosis not present

## 2024-08-25 DIAGNOSIS — J449 Chronic obstructive pulmonary disease, unspecified: Secondary | ICD-10-CM | POA: Diagnosis not present

## 2024-08-25 DIAGNOSIS — I119 Hypertensive heart disease without heart failure: Secondary | ICD-10-CM | POA: Diagnosis not present

## 2024-08-25 DIAGNOSIS — E43 Unspecified severe protein-calorie malnutrition: Secondary | ICD-10-CM | POA: Diagnosis not present

## 2024-08-25 DIAGNOSIS — I2510 Atherosclerotic heart disease of native coronary artery without angina pectoris: Secondary | ICD-10-CM | POA: Diagnosis not present

## 2024-08-26 DIAGNOSIS — F32A Depression, unspecified: Secondary | ICD-10-CM | POA: Diagnosis not present

## 2024-08-26 DIAGNOSIS — I2510 Atherosclerotic heart disease of native coronary artery without angina pectoris: Secondary | ICD-10-CM | POA: Diagnosis not present

## 2024-08-26 DIAGNOSIS — E43 Unspecified severe protein-calorie malnutrition: Secondary | ICD-10-CM | POA: Diagnosis not present

## 2024-08-26 DIAGNOSIS — J449 Chronic obstructive pulmonary disease, unspecified: Secondary | ICD-10-CM | POA: Diagnosis not present

## 2024-08-26 DIAGNOSIS — I679 Cerebrovascular disease, unspecified: Secondary | ICD-10-CM | POA: Diagnosis not present

## 2024-08-26 DIAGNOSIS — I119 Hypertensive heart disease without heart failure: Secondary | ICD-10-CM | POA: Diagnosis not present

## 2024-08-28 DIAGNOSIS — J449 Chronic obstructive pulmonary disease, unspecified: Secondary | ICD-10-CM | POA: Diagnosis not present

## 2024-08-28 DIAGNOSIS — I679 Cerebrovascular disease, unspecified: Secondary | ICD-10-CM | POA: Diagnosis not present

## 2024-08-28 DIAGNOSIS — R55 Syncope and collapse: Secondary | ICD-10-CM | POA: Diagnosis not present

## 2024-08-28 DIAGNOSIS — Z66 Do not resuscitate: Secondary | ICD-10-CM | POA: Diagnosis not present

## 2024-08-28 DIAGNOSIS — I119 Hypertensive heart disease without heart failure: Secondary | ICD-10-CM | POA: Diagnosis not present

## 2024-08-28 DIAGNOSIS — I619 Nontraumatic intracerebral hemorrhage, unspecified: Secondary | ICD-10-CM | POA: Diagnosis not present

## 2024-08-28 DIAGNOSIS — R296 Repeated falls: Secondary | ICD-10-CM | POA: Diagnosis not present

## 2024-08-28 DIAGNOSIS — E43 Unspecified severe protein-calorie malnutrition: Secondary | ICD-10-CM | POA: Diagnosis not present

## 2024-08-28 DIAGNOSIS — F32A Depression, unspecified: Secondary | ICD-10-CM | POA: Diagnosis not present

## 2024-08-28 DIAGNOSIS — I2510 Atherosclerotic heart disease of native coronary artery without angina pectoris: Secondary | ICD-10-CM | POA: Diagnosis not present

## 2024-08-28 DIAGNOSIS — E118 Type 2 diabetes mellitus with unspecified complications: Secondary | ICD-10-CM | POA: Diagnosis not present

## 2024-08-31 DIAGNOSIS — I679 Cerebrovascular disease, unspecified: Secondary | ICD-10-CM | POA: Diagnosis not present

## 2024-08-31 DIAGNOSIS — I2510 Atherosclerotic heart disease of native coronary artery without angina pectoris: Secondary | ICD-10-CM | POA: Diagnosis not present

## 2024-08-31 DIAGNOSIS — F32A Depression, unspecified: Secondary | ICD-10-CM | POA: Diagnosis not present

## 2024-08-31 DIAGNOSIS — I119 Hypertensive heart disease without heart failure: Secondary | ICD-10-CM | POA: Diagnosis not present

## 2024-08-31 DIAGNOSIS — E43 Unspecified severe protein-calorie malnutrition: Secondary | ICD-10-CM | POA: Diagnosis not present

## 2024-08-31 DIAGNOSIS — J449 Chronic obstructive pulmonary disease, unspecified: Secondary | ICD-10-CM | POA: Diagnosis not present

## 2024-09-01 DIAGNOSIS — J449 Chronic obstructive pulmonary disease, unspecified: Secondary | ICD-10-CM | POA: Diagnosis not present

## 2024-09-01 DIAGNOSIS — I679 Cerebrovascular disease, unspecified: Secondary | ICD-10-CM | POA: Diagnosis not present

## 2024-09-01 DIAGNOSIS — E43 Unspecified severe protein-calorie malnutrition: Secondary | ICD-10-CM | POA: Diagnosis not present

## 2024-09-01 DIAGNOSIS — F32A Depression, unspecified: Secondary | ICD-10-CM | POA: Diagnosis not present

## 2024-09-01 DIAGNOSIS — I119 Hypertensive heart disease without heart failure: Secondary | ICD-10-CM | POA: Diagnosis not present

## 2024-09-01 DIAGNOSIS — I2510 Atherosclerotic heart disease of native coronary artery without angina pectoris: Secondary | ICD-10-CM | POA: Diagnosis not present

## 2024-09-02 DIAGNOSIS — I679 Cerebrovascular disease, unspecified: Secondary | ICD-10-CM | POA: Diagnosis not present

## 2024-09-02 DIAGNOSIS — I2510 Atherosclerotic heart disease of native coronary artery without angina pectoris: Secondary | ICD-10-CM | POA: Diagnosis not present

## 2024-09-02 DIAGNOSIS — J449 Chronic obstructive pulmonary disease, unspecified: Secondary | ICD-10-CM | POA: Diagnosis not present

## 2024-09-02 DIAGNOSIS — E43 Unspecified severe protein-calorie malnutrition: Secondary | ICD-10-CM | POA: Diagnosis not present

## 2024-09-02 DIAGNOSIS — I119 Hypertensive heart disease without heart failure: Secondary | ICD-10-CM | POA: Diagnosis not present

## 2024-09-02 DIAGNOSIS — F32A Depression, unspecified: Secondary | ICD-10-CM | POA: Diagnosis not present

## 2024-09-03 DIAGNOSIS — F01A0 Vascular dementia, mild, without behavioral disturbance, psychotic disturbance, mood disturbance, and anxiety: Secondary | ICD-10-CM | POA: Diagnosis not present

## 2024-09-03 DIAGNOSIS — J309 Allergic rhinitis, unspecified: Secondary | ICD-10-CM | POA: Diagnosis not present

## 2024-09-03 DIAGNOSIS — I10 Essential (primary) hypertension: Secondary | ICD-10-CM | POA: Diagnosis not present

## 2024-09-06 DIAGNOSIS — I119 Hypertensive heart disease without heart failure: Secondary | ICD-10-CM | POA: Diagnosis not present

## 2024-09-06 DIAGNOSIS — I679 Cerebrovascular disease, unspecified: Secondary | ICD-10-CM | POA: Diagnosis not present

## 2024-09-06 DIAGNOSIS — I2510 Atherosclerotic heart disease of native coronary artery without angina pectoris: Secondary | ICD-10-CM | POA: Diagnosis not present

## 2024-09-06 DIAGNOSIS — F32A Depression, unspecified: Secondary | ICD-10-CM | POA: Diagnosis not present

## 2024-09-06 DIAGNOSIS — J449 Chronic obstructive pulmonary disease, unspecified: Secondary | ICD-10-CM | POA: Diagnosis not present

## 2024-09-06 DIAGNOSIS — E43 Unspecified severe protein-calorie malnutrition: Secondary | ICD-10-CM | POA: Diagnosis not present

## 2024-09-07 ENCOUNTER — Ambulatory Visit (INDEPENDENT_AMBULATORY_CARE_PROVIDER_SITE_OTHER)

## 2024-09-07 DIAGNOSIS — I679 Cerebrovascular disease, unspecified: Secondary | ICD-10-CM | POA: Diagnosis not present

## 2024-09-07 DIAGNOSIS — E43 Unspecified severe protein-calorie malnutrition: Secondary | ICD-10-CM | POA: Diagnosis not present

## 2024-09-07 DIAGNOSIS — F32A Depression, unspecified: Secondary | ICD-10-CM | POA: Diagnosis not present

## 2024-09-07 DIAGNOSIS — J449 Chronic obstructive pulmonary disease, unspecified: Secondary | ICD-10-CM | POA: Diagnosis not present

## 2024-09-07 DIAGNOSIS — I119 Hypertensive heart disease without heart failure: Secondary | ICD-10-CM | POA: Diagnosis not present

## 2024-09-07 DIAGNOSIS — I2510 Atherosclerotic heart disease of native coronary artery without angina pectoris: Secondary | ICD-10-CM | POA: Diagnosis not present

## 2024-09-08 ENCOUNTER — Ambulatory Visit: Payer: Self-pay | Admitting: Cardiology

## 2024-09-08 DIAGNOSIS — I119 Hypertensive heart disease without heart failure: Secondary | ICD-10-CM | POA: Diagnosis not present

## 2024-09-08 DIAGNOSIS — F32A Depression, unspecified: Secondary | ICD-10-CM | POA: Diagnosis not present

## 2024-09-08 DIAGNOSIS — J449 Chronic obstructive pulmonary disease, unspecified: Secondary | ICD-10-CM | POA: Diagnosis not present

## 2024-09-08 DIAGNOSIS — I679 Cerebrovascular disease, unspecified: Secondary | ICD-10-CM | POA: Diagnosis not present

## 2024-09-08 DIAGNOSIS — E43 Unspecified severe protein-calorie malnutrition: Secondary | ICD-10-CM | POA: Diagnosis not present

## 2024-09-08 DIAGNOSIS — I2510 Atherosclerotic heart disease of native coronary artery without angina pectoris: Secondary | ICD-10-CM | POA: Diagnosis not present

## 2024-09-08 LAB — CUP PACEART REMOTE DEVICE CHECK
Battery Remaining Longevity: 82 mo
Battery Remaining Percentage: 85 %
Battery Voltage: 3.01 V
Brady Statistic AP VP Percent: 1 %
Brady Statistic AP VS Percent: 8.2 %
Brady Statistic AS VP Percent: 1 %
Brady Statistic AS VS Percent: 91 %
Brady Statistic RA Percent Paced: 6.3 %
Brady Statistic RV Percent Paced: 1 %
Date Time Interrogation Session: 20251111055843
Implantable Lead Connection Status: 753985
Implantable Lead Connection Status: 753985
Implantable Lead Implant Date: 20230706
Implantable Lead Implant Date: 20230706
Implantable Lead Location: 753859
Implantable Lead Location: 753860
Implantable Pulse Generator Implant Date: 20230706
Lead Channel Impedance Value: 460 Ohm
Lead Channel Impedance Value: 530 Ohm
Lead Channel Pacing Threshold Amplitude: 0.5 V
Lead Channel Pacing Threshold Amplitude: 0.5 V
Lead Channel Pacing Threshold Pulse Width: 0.3 ms
Lead Channel Pacing Threshold Pulse Width: 0.5 ms
Lead Channel Sensing Intrinsic Amplitude: 12 mV
Lead Channel Sensing Intrinsic Amplitude: 4.6 mV
Lead Channel Setting Pacing Amplitude: 3.5 V
Lead Channel Setting Pacing Amplitude: 3.5 V
Lead Channel Setting Pacing Pulse Width: 0.5 ms
Lead Channel Setting Sensing Sensitivity: 2 mV
Pulse Gen Model: 2272
Pulse Gen Serial Number: 8098905

## 2024-09-09 DIAGNOSIS — E43 Unspecified severe protein-calorie malnutrition: Secondary | ICD-10-CM | POA: Diagnosis not present

## 2024-09-09 DIAGNOSIS — I2510 Atherosclerotic heart disease of native coronary artery without angina pectoris: Secondary | ICD-10-CM | POA: Diagnosis not present

## 2024-09-09 DIAGNOSIS — J449 Chronic obstructive pulmonary disease, unspecified: Secondary | ICD-10-CM | POA: Diagnosis not present

## 2024-09-09 DIAGNOSIS — F32A Depression, unspecified: Secondary | ICD-10-CM | POA: Diagnosis not present

## 2024-09-09 DIAGNOSIS — I679 Cerebrovascular disease, unspecified: Secondary | ICD-10-CM | POA: Diagnosis not present

## 2024-09-09 DIAGNOSIS — I119 Hypertensive heart disease without heart failure: Secondary | ICD-10-CM | POA: Diagnosis not present

## 2024-09-10 NOTE — Progress Notes (Signed)
 Remote PPM Transmission

## 2024-09-14 DIAGNOSIS — I119 Hypertensive heart disease without heart failure: Secondary | ICD-10-CM | POA: Diagnosis not present

## 2024-09-14 DIAGNOSIS — J449 Chronic obstructive pulmonary disease, unspecified: Secondary | ICD-10-CM | POA: Diagnosis not present

## 2024-09-14 DIAGNOSIS — E43 Unspecified severe protein-calorie malnutrition: Secondary | ICD-10-CM | POA: Diagnosis not present

## 2024-09-14 DIAGNOSIS — I679 Cerebrovascular disease, unspecified: Secondary | ICD-10-CM | POA: Diagnosis not present

## 2024-09-14 DIAGNOSIS — I2510 Atherosclerotic heart disease of native coronary artery without angina pectoris: Secondary | ICD-10-CM | POA: Diagnosis not present

## 2024-09-14 DIAGNOSIS — F32A Depression, unspecified: Secondary | ICD-10-CM | POA: Diagnosis not present

## 2024-09-15 DIAGNOSIS — I2510 Atherosclerotic heart disease of native coronary artery without angina pectoris: Secondary | ICD-10-CM | POA: Diagnosis not present

## 2024-09-15 DIAGNOSIS — F32A Depression, unspecified: Secondary | ICD-10-CM | POA: Diagnosis not present

## 2024-09-15 DIAGNOSIS — I119 Hypertensive heart disease without heart failure: Secondary | ICD-10-CM | POA: Diagnosis not present

## 2024-09-15 DIAGNOSIS — I679 Cerebrovascular disease, unspecified: Secondary | ICD-10-CM | POA: Diagnosis not present

## 2024-09-15 DIAGNOSIS — J449 Chronic obstructive pulmonary disease, unspecified: Secondary | ICD-10-CM | POA: Diagnosis not present

## 2024-09-15 DIAGNOSIS — E43 Unspecified severe protein-calorie malnutrition: Secondary | ICD-10-CM | POA: Diagnosis not present

## 2024-09-16 DIAGNOSIS — I119 Hypertensive heart disease without heart failure: Secondary | ICD-10-CM | POA: Diagnosis not present

## 2024-09-16 DIAGNOSIS — F32A Depression, unspecified: Secondary | ICD-10-CM | POA: Diagnosis not present

## 2024-09-16 DIAGNOSIS — E43 Unspecified severe protein-calorie malnutrition: Secondary | ICD-10-CM | POA: Diagnosis not present

## 2024-09-16 DIAGNOSIS — I2510 Atherosclerotic heart disease of native coronary artery without angina pectoris: Secondary | ICD-10-CM | POA: Diagnosis not present

## 2024-09-16 DIAGNOSIS — J449 Chronic obstructive pulmonary disease, unspecified: Secondary | ICD-10-CM | POA: Diagnosis not present

## 2024-09-16 DIAGNOSIS — I679 Cerebrovascular disease, unspecified: Secondary | ICD-10-CM | POA: Diagnosis not present

## 2024-09-21 DIAGNOSIS — I119 Hypertensive heart disease without heart failure: Secondary | ICD-10-CM | POA: Diagnosis not present

## 2024-09-21 DIAGNOSIS — I2510 Atherosclerotic heart disease of native coronary artery without angina pectoris: Secondary | ICD-10-CM | POA: Diagnosis not present

## 2024-09-21 DIAGNOSIS — I679 Cerebrovascular disease, unspecified: Secondary | ICD-10-CM | POA: Diagnosis not present

## 2024-09-21 DIAGNOSIS — E43 Unspecified severe protein-calorie malnutrition: Secondary | ICD-10-CM | POA: Diagnosis not present

## 2024-09-21 DIAGNOSIS — F32A Depression, unspecified: Secondary | ICD-10-CM | POA: Diagnosis not present

## 2024-09-21 DIAGNOSIS — J449 Chronic obstructive pulmonary disease, unspecified: Secondary | ICD-10-CM | POA: Diagnosis not present

## 2024-10-06 DIAGNOSIS — N390 Urinary tract infection, site not specified: Secondary | ICD-10-CM | POA: Diagnosis not present

## 2024-10-08 DIAGNOSIS — F01A0 Vascular dementia, mild, without behavioral disturbance, psychotic disturbance, mood disturbance, and anxiety: Secondary | ICD-10-CM | POA: Diagnosis not present

## 2024-10-08 DIAGNOSIS — F5105 Insomnia due to other mental disorder: Secondary | ICD-10-CM | POA: Diagnosis not present

## 2024-10-08 DIAGNOSIS — I10 Essential (primary) hypertension: Secondary | ICD-10-CM | POA: Diagnosis not present

## 2024-10-20 ENCOUNTER — Emergency Department (HOSPITAL_COMMUNITY)
Admission: EM | Admit: 2024-10-20 | Discharge: 2024-10-21 | Disposition: A | Discharge status: Home / Self Care | Attending: Emergency Medicine | Admitting: Emergency Medicine

## 2024-10-20 ENCOUNTER — Emergency Department (HOSPITAL_COMMUNITY)

## 2024-10-20 ENCOUNTER — Other Ambulatory Visit: Payer: Self-pay

## 2024-10-20 DIAGNOSIS — W1809XA Striking against other object with subsequent fall, initial encounter: Secondary | ICD-10-CM | POA: Insufficient documentation

## 2024-10-20 DIAGNOSIS — S01112A Laceration without foreign body of left eyelid and periocular area, initial encounter: Secondary | ICD-10-CM | POA: Diagnosis not present

## 2024-10-20 DIAGNOSIS — I10 Essential (primary) hypertension: Secondary | ICD-10-CM | POA: Diagnosis not present

## 2024-10-20 DIAGNOSIS — W19XXXA Unspecified fall, initial encounter: Secondary | ICD-10-CM

## 2024-10-20 DIAGNOSIS — J4489 Other specified chronic obstructive pulmonary disease: Secondary | ICD-10-CM | POA: Diagnosis not present

## 2024-10-20 DIAGNOSIS — S0990XA Unspecified injury of head, initial encounter: Secondary | ICD-10-CM | POA: Diagnosis present
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
## 2024-10-21 ENCOUNTER — Encounter (HOSPITAL_COMMUNITY): Payer: Self-pay

## 2024-10-21 MED ORDER — LIDOCAINE HCL (PF) 1 % IJ SOLN
5.0000 mL | Freq: Once | INTRAMUSCULAR | Status: AC
  Administered 2024-10-21: 5 mL via INTRADERMAL
  Filled 2024-10-21: qty 5

## 2024-10-21 NOTE — Discharge Instructions (Signed)
 CT head/neck/face without acute findings.  3 sutures in place to left eyebrow, can be removed in 7-10 days. Follow-up with PCP as needed. Return here for new concerns.

## 2024-12-07 ENCOUNTER — Ambulatory Visit

## 2025-03-08 ENCOUNTER — Ambulatory Visit

## 2025-06-07 ENCOUNTER — Ambulatory Visit

## 2025-09-06 ENCOUNTER — Ambulatory Visit

## 2025-12-06 ENCOUNTER — Ambulatory Visit
